# Patient Record
Sex: Female | Born: 1980 | Race: White | Hispanic: No | Marital: Married | Smoking: Former smoker
Health system: Southern US, Community
[De-identification: ages and names within clinical notes are randomized; demographics above are authoritative.]

## PROBLEM LIST (undated history)

## (undated) DIAGNOSIS — I10 Essential (primary) hypertension: Secondary | ICD-10-CM

## (undated) DIAGNOSIS — G43909 Migraine, unspecified, not intractable, without status migrainosus: Secondary | ICD-10-CM

## (undated) DIAGNOSIS — M1990 Unspecified osteoarthritis, unspecified site: Secondary | ICD-10-CM

## (undated) DIAGNOSIS — Z9109 Other allergy status, other than to drugs and biological substances: Secondary | ICD-10-CM

## (undated) DIAGNOSIS — F419 Anxiety disorder, unspecified: Secondary | ICD-10-CM

## (undated) DIAGNOSIS — K259 Gastric ulcer, unspecified as acute or chronic, without hemorrhage or perforation: Secondary | ICD-10-CM

## (undated) DIAGNOSIS — K219 Gastro-esophageal reflux disease without esophagitis: Secondary | ICD-10-CM

## (undated) DIAGNOSIS — K2970 Gastritis, unspecified, without bleeding: Secondary | ICD-10-CM

## (undated) HISTORY — PX: CHOLECYSTECTOMY: SHX55

## (undated) HISTORY — PX: LEG SURGERY: SHX1003

## (undated) HISTORY — PX: BILATERAL SALPINGECTOMY: SHX5743

## (undated) HISTORY — PX: BRAIN SURGERY: SHX531

## (undated) HISTORY — PX: BONE BIOPSY: SHX375

## (undated) HISTORY — PX: ECTOPIC PREGNANCY SURGERY: SHX613

## (undated) HISTORY — DX: Anxiety disorder, unspecified: F41.9

## (undated) HISTORY — PX: OTHER SURGICAL HISTORY: SHX169

---

## 2001-03-02 ENCOUNTER — Encounter: Payer: Self-pay | Admitting: Internal Medicine

## 2001-03-02 ENCOUNTER — Ambulatory Visit (HOSPITAL_COMMUNITY): Admission: RE | Admit: 2001-03-02 | Discharge: 2001-03-02 | Payer: Self-pay | Admitting: Internal Medicine

## 2001-05-14 ENCOUNTER — Emergency Department (HOSPITAL_COMMUNITY): Admission: EM | Admit: 2001-05-14 | Discharge: 2001-05-14 | Payer: Self-pay | Admitting: Emergency Medicine

## 2001-07-01 ENCOUNTER — Emergency Department (HOSPITAL_COMMUNITY): Admission: EM | Admit: 2001-07-01 | Discharge: 2001-07-01 | Payer: Self-pay | Admitting: *Deleted

## 2001-07-01 ENCOUNTER — Encounter: Payer: Self-pay | Admitting: *Deleted

## 2001-08-01 ENCOUNTER — Encounter: Payer: Self-pay | Admitting: Internal Medicine

## 2001-08-01 ENCOUNTER — Ambulatory Visit (HOSPITAL_COMMUNITY): Admission: RE | Admit: 2001-08-01 | Discharge: 2001-08-01 | Payer: Self-pay | Admitting: Internal Medicine

## 2001-12-29 ENCOUNTER — Inpatient Hospital Stay (HOSPITAL_COMMUNITY): Admission: AD | Admit: 2001-12-29 | Discharge: 2001-12-31 | Payer: Self-pay | Admitting: Oral Surgery

## 2001-12-29 ENCOUNTER — Encounter: Payer: Self-pay | Admitting: Oral Surgery

## 2002-05-11 ENCOUNTER — Ambulatory Visit (HOSPITAL_COMMUNITY): Admission: RE | Admit: 2002-05-11 | Discharge: 2002-05-11 | Payer: Self-pay | Admitting: Internal Medicine

## 2002-05-11 ENCOUNTER — Encounter: Payer: Self-pay | Admitting: Internal Medicine

## 2002-06-13 ENCOUNTER — Ambulatory Visit (HOSPITAL_COMMUNITY): Admission: RE | Admit: 2002-06-13 | Discharge: 2002-06-13 | Payer: Self-pay | Admitting: Internal Medicine

## 2002-07-08 ENCOUNTER — Emergency Department (HOSPITAL_COMMUNITY): Admission: EM | Admit: 2002-07-08 | Discharge: 2002-07-08 | Payer: Self-pay | Admitting: *Deleted

## 2002-07-09 ENCOUNTER — Observation Stay (HOSPITAL_COMMUNITY): Admission: EM | Admit: 2002-07-09 | Discharge: 2002-07-10 | Payer: Self-pay | Admitting: Emergency Medicine

## 2002-07-10 ENCOUNTER — Encounter: Payer: Self-pay | Admitting: *Deleted

## 2002-11-12 ENCOUNTER — Emergency Department (HOSPITAL_COMMUNITY): Admission: EM | Admit: 2002-11-12 | Discharge: 2002-11-12 | Payer: Self-pay | Admitting: Internal Medicine

## 2002-11-12 ENCOUNTER — Encounter: Payer: Self-pay | Admitting: Internal Medicine

## 2003-01-11 ENCOUNTER — Encounter: Payer: Self-pay | Admitting: Internal Medicine

## 2003-01-11 ENCOUNTER — Ambulatory Visit (HOSPITAL_COMMUNITY): Admission: RE | Admit: 2003-01-11 | Discharge: 2003-01-11 | Payer: Self-pay | Admitting: Internal Medicine

## 2003-04-12 ENCOUNTER — Ambulatory Visit (HOSPITAL_COMMUNITY): Admission: RE | Admit: 2003-04-12 | Discharge: 2003-04-12 | Payer: Self-pay | Admitting: Internal Medicine

## 2003-04-12 ENCOUNTER — Encounter: Payer: Self-pay | Admitting: Internal Medicine

## 2003-04-25 ENCOUNTER — Emergency Department (HOSPITAL_COMMUNITY): Admission: EM | Admit: 2003-04-25 | Discharge: 2003-04-25 | Payer: Self-pay | Admitting: Emergency Medicine

## 2003-04-26 ENCOUNTER — Encounter: Payer: Self-pay | Admitting: Internal Medicine

## 2003-04-26 ENCOUNTER — Ambulatory Visit (HOSPITAL_COMMUNITY): Admission: RE | Admit: 2003-04-26 | Discharge: 2003-04-26 | Payer: Self-pay | Admitting: Internal Medicine

## 2003-05-14 ENCOUNTER — Encounter: Payer: Self-pay | Admitting: Family Medicine

## 2003-05-14 ENCOUNTER — Ambulatory Visit (HOSPITAL_COMMUNITY): Admission: RE | Admit: 2003-05-14 | Discharge: 2003-05-14 | Payer: Self-pay | Admitting: Family Medicine

## 2003-08-20 ENCOUNTER — Inpatient Hospital Stay (HOSPITAL_COMMUNITY): Admission: EM | Admit: 2003-08-20 | Discharge: 2003-08-22 | Payer: Self-pay | Admitting: Internal Medicine

## 2003-09-07 ENCOUNTER — Ambulatory Visit (HOSPITAL_COMMUNITY): Admission: AD | Admit: 2003-09-07 | Discharge: 2003-09-07 | Payer: Self-pay | Admitting: Obstetrics & Gynecology

## 2003-11-26 ENCOUNTER — Ambulatory Visit (HOSPITAL_COMMUNITY): Admission: AD | Admit: 2003-11-26 | Discharge: 2003-11-26 | Payer: Self-pay | Admitting: Obstetrics & Gynecology

## 2003-12-09 ENCOUNTER — Ambulatory Visit (HOSPITAL_COMMUNITY): Admission: AD | Admit: 2003-12-09 | Discharge: 2003-12-09 | Payer: Self-pay | Admitting: Obstetrics and Gynecology

## 2003-12-10 ENCOUNTER — Ambulatory Visit (HOSPITAL_COMMUNITY): Admission: AD | Admit: 2003-12-10 | Discharge: 2003-12-10 | Payer: Self-pay | Admitting: Obstetrics and Gynecology

## 2004-01-20 ENCOUNTER — Inpatient Hospital Stay (HOSPITAL_COMMUNITY): Admission: AD | Admit: 2004-01-20 | Discharge: 2004-01-22 | Payer: Self-pay | Admitting: Obstetrics and Gynecology

## 2004-07-07 ENCOUNTER — Encounter (HOSPITAL_COMMUNITY): Admission: RE | Admit: 2004-07-07 | Discharge: 2004-07-08 | Payer: Self-pay | Admitting: Internal Medicine

## 2004-10-15 ENCOUNTER — Ambulatory Visit (HOSPITAL_COMMUNITY): Admission: RE | Admit: 2004-10-15 | Discharge: 2004-10-15 | Payer: Self-pay | Admitting: Obstetrics and Gynecology

## 2004-11-20 ENCOUNTER — Ambulatory Visit (HOSPITAL_COMMUNITY): Admission: RE | Admit: 2004-11-20 | Discharge: 2004-11-20 | Payer: Self-pay | Admitting: Internal Medicine

## 2005-02-19 ENCOUNTER — Emergency Department (HOSPITAL_COMMUNITY): Admission: EM | Admit: 2005-02-19 | Discharge: 2005-02-19 | Payer: Self-pay | Admitting: Emergency Medicine

## 2006-08-16 ENCOUNTER — Emergency Department (HOSPITAL_COMMUNITY): Admission: EM | Admit: 2006-08-16 | Discharge: 2006-08-16 | Payer: Self-pay | Admitting: Emergency Medicine

## 2006-08-16 IMAGING — CR DG ABDOMEN ACUTE W/ 1V CHEST
3 series · 3 of 3 positions shown · non-contrast
Comparison: None

CLINICAL DATA: Vomiting

ABDOMEN SERIES - 2 VIEW & CHEST - 1 VIEW

[view not recorded (1 of 3)]
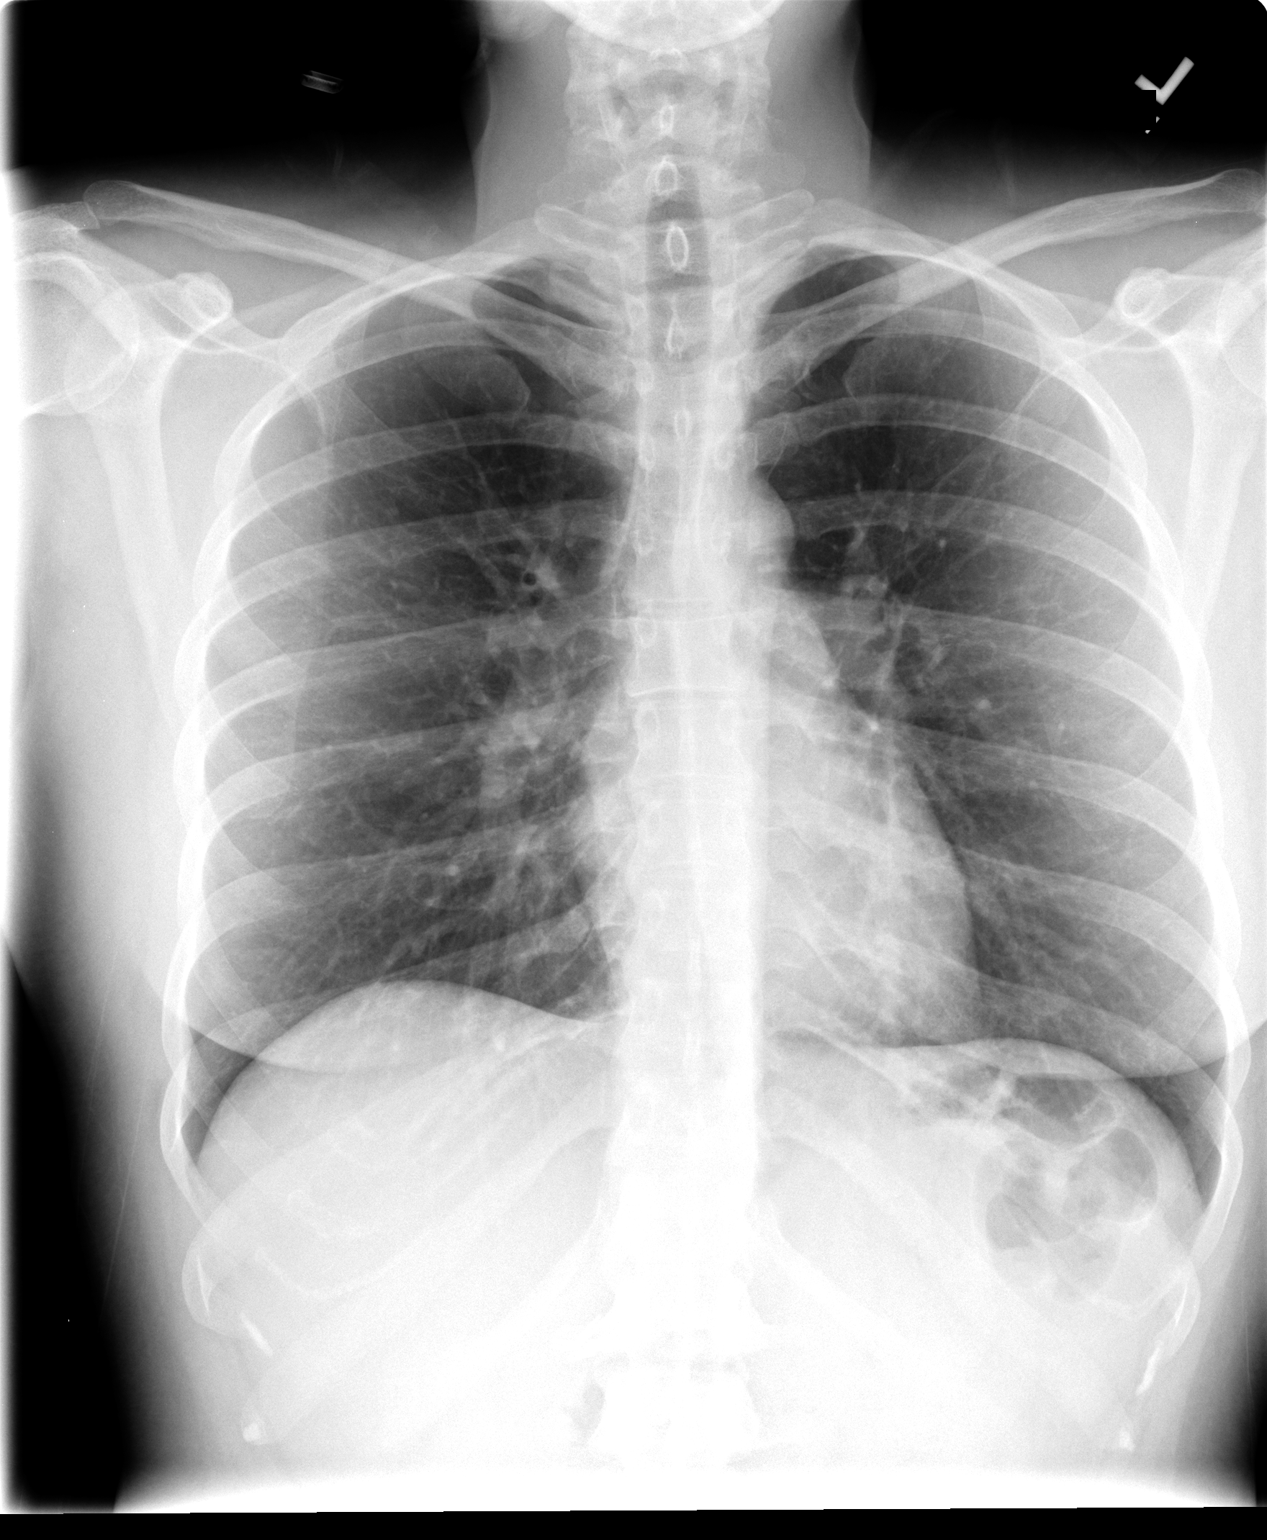

[view not recorded (2 of 3)]
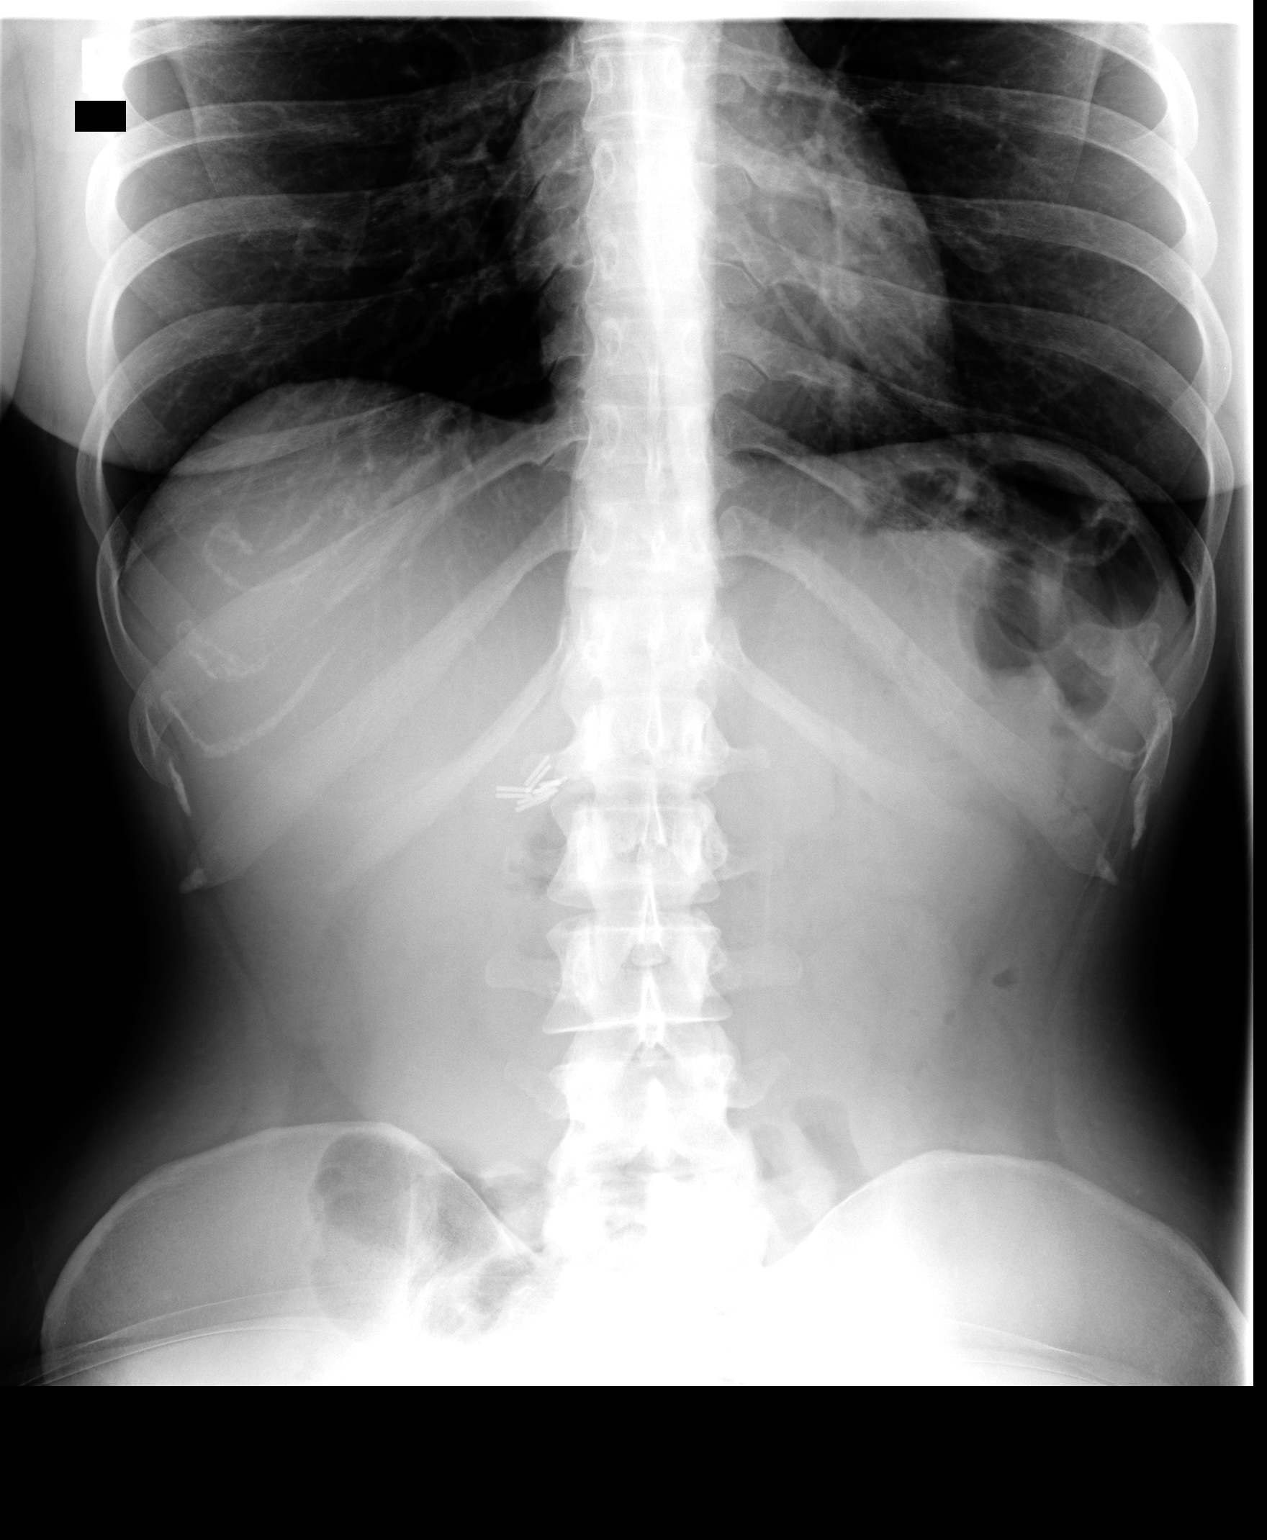

[view not recorded (3 of 3)]
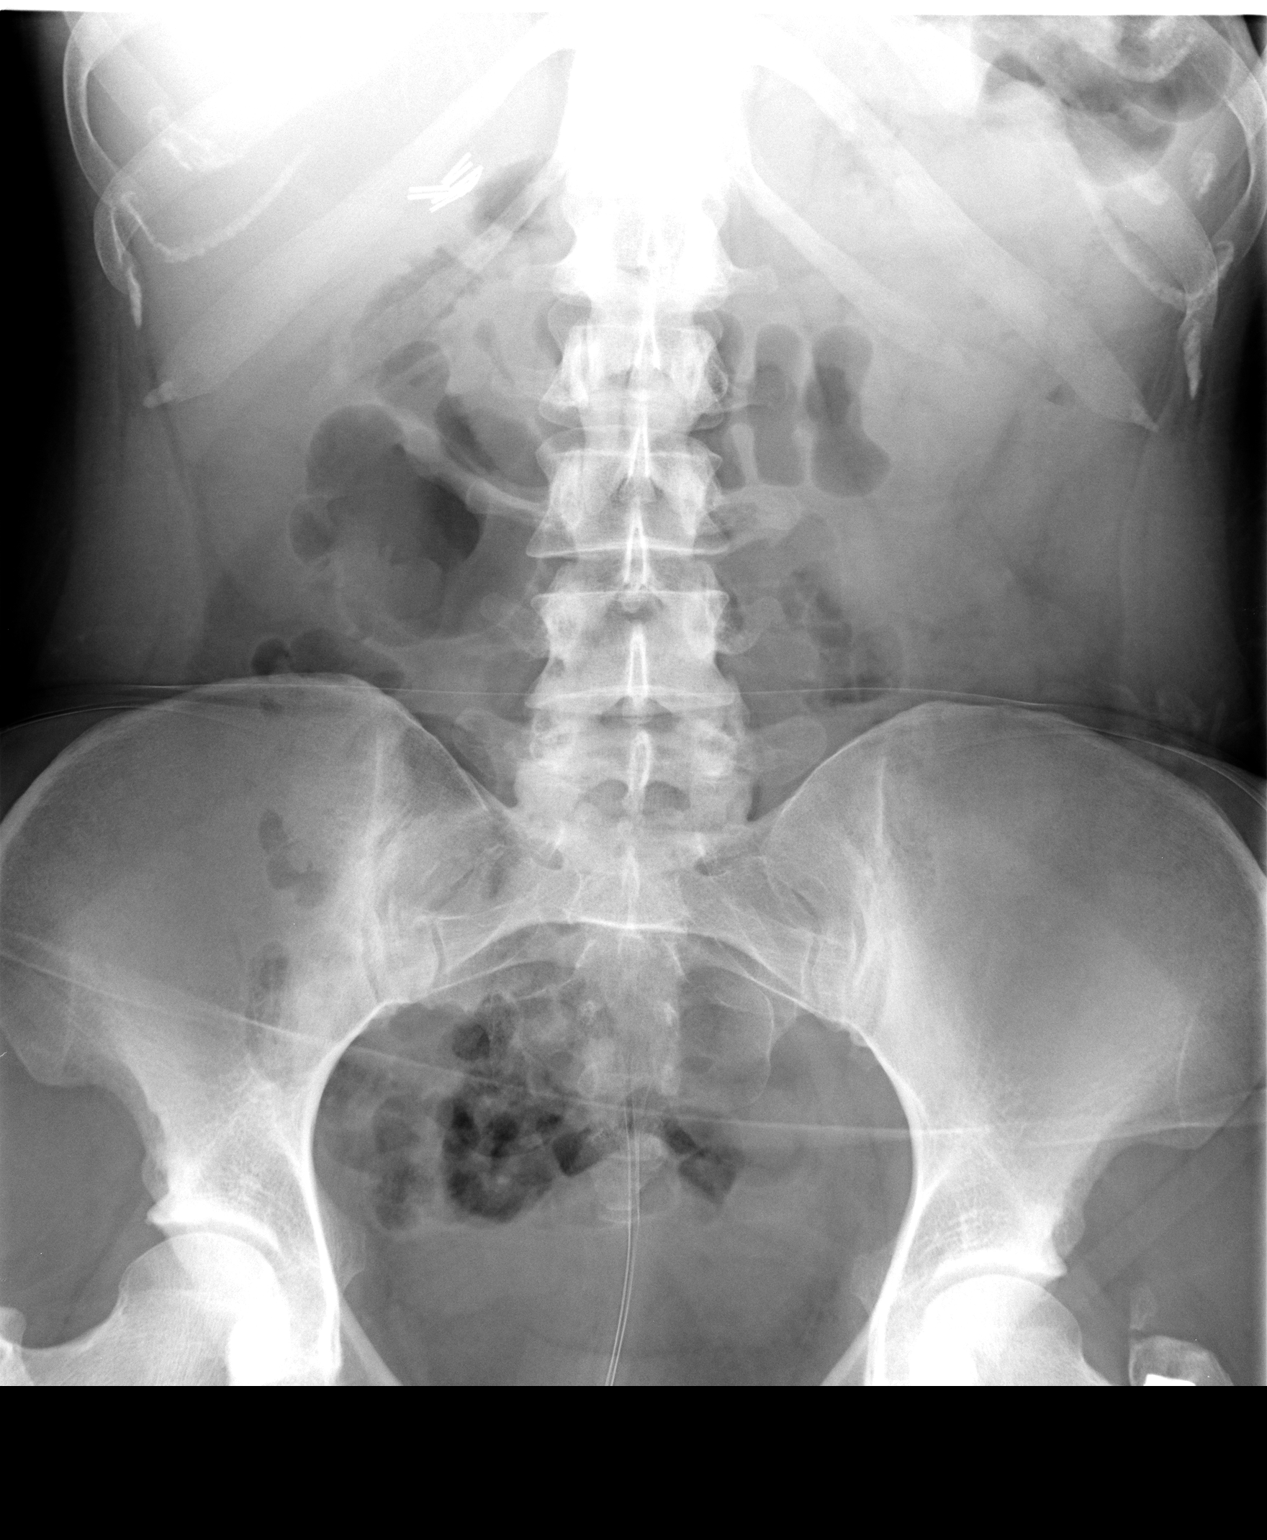

[3 of 3 positions shown; findings below may reference images not displayed]

FINDINGS: Cardiomediastinal contours are within normal limits. Lungs are clear.

There is a nonobstructive bowel gas pattern. No free air, organomegaly, or
suspicious calcification. Patient status post cholecystectomy.

IMPRESSION

No obstruction or free air.

No active cardiopulmonary disease.

## 2006-08-17 ENCOUNTER — Inpatient Hospital Stay (HOSPITAL_COMMUNITY): Admission: EM | Admit: 2006-08-17 | Discharge: 2006-08-18 | Payer: Self-pay | Admitting: Emergency Medicine

## 2006-08-17 IMAGING — US US ABDOMEN COMPLETE
1 series · 14 of 25 positions shown · non-contrast
Comparison: [DATE] plain films.

CLINICAL DATA: Nausea and vomiting.
 ABDOMEN ULTRASOUND:
TECHNIQUE: Complete abdominal ultrasound examination was performed including evaluation of the liver, gallbladder, bile ducts, pancreas, kidneys, spleen, IVC, and abdominal aorta.

[Series 1: unknown · 0.33mm/px · 14 of 47 slices shown]
[im 1/47]
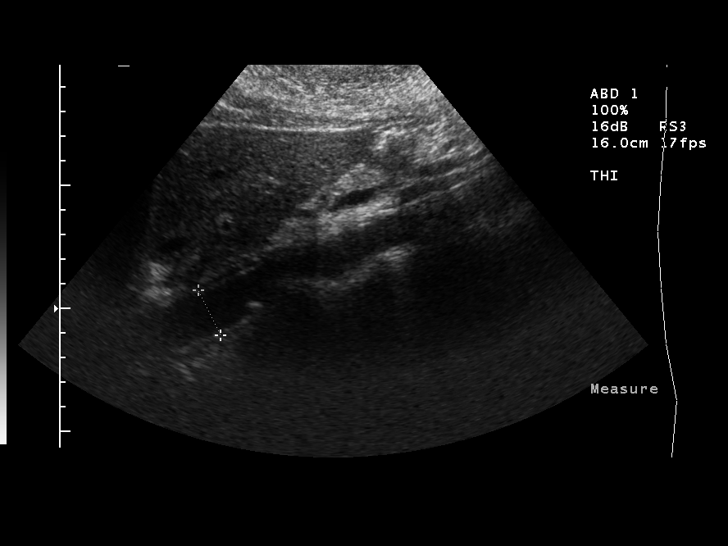
[im 4/47]
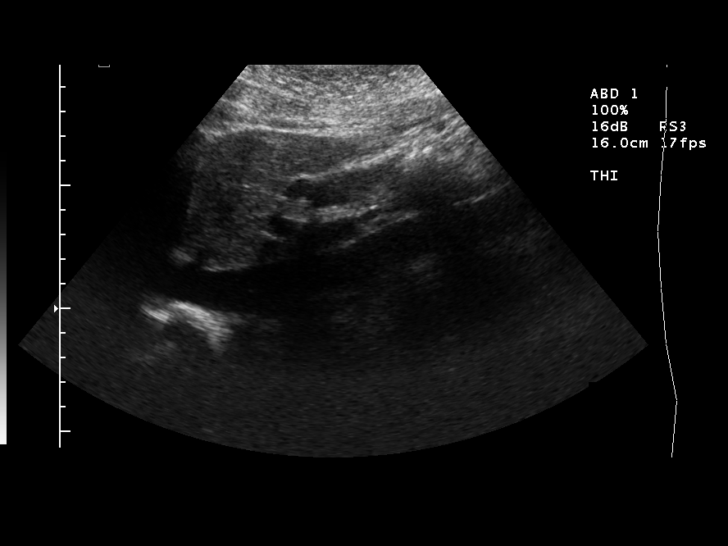
[im 8/47]
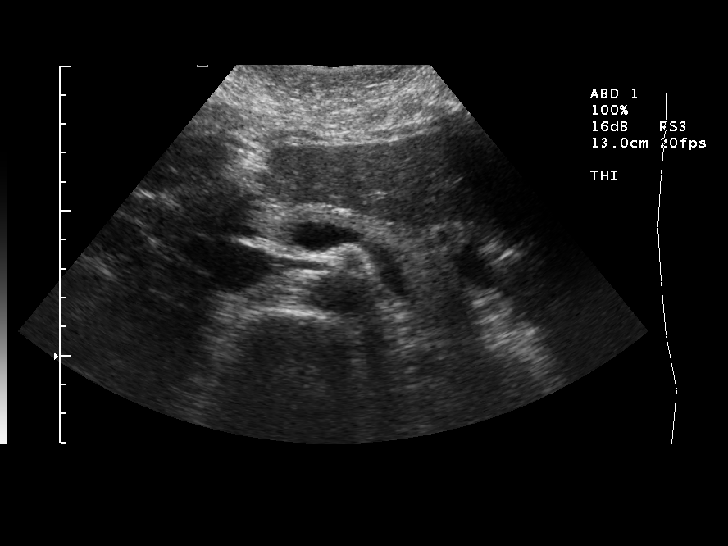
[im 12/47]
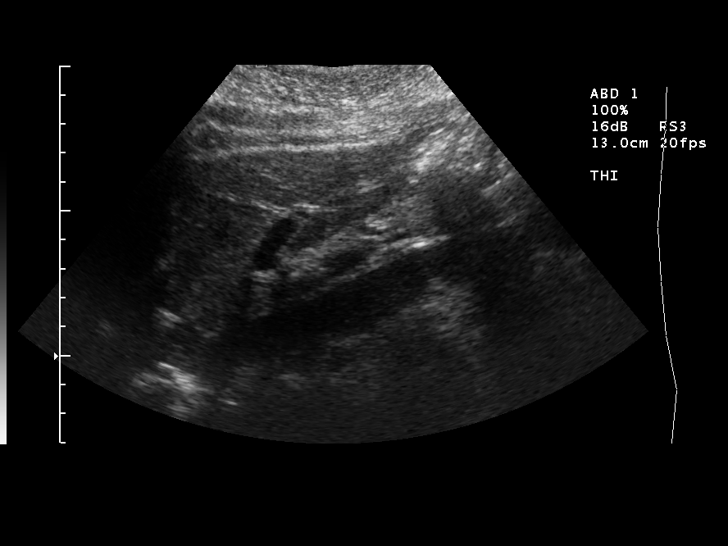
[im 16/47]
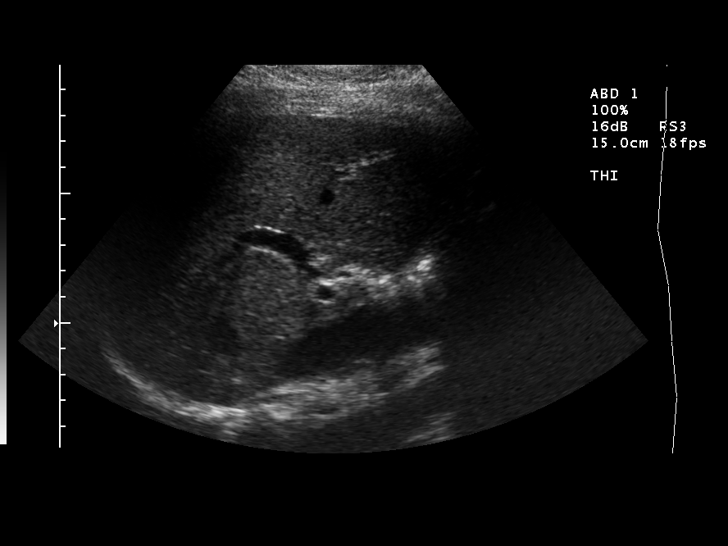
[im 18/47]
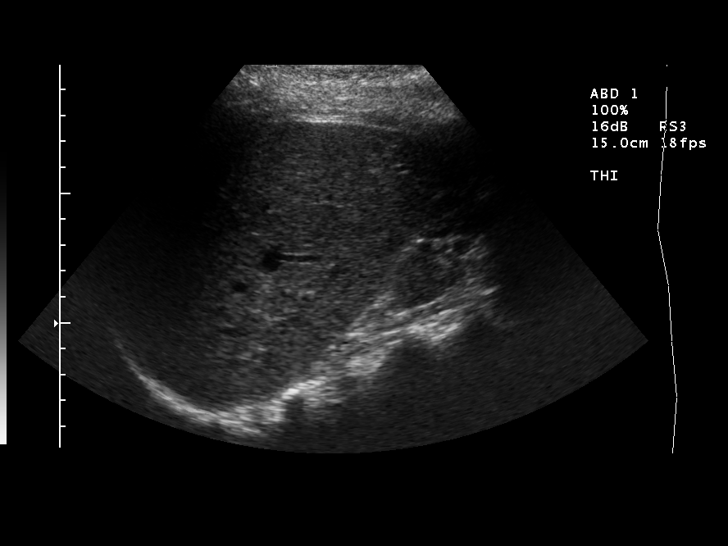
[im 22/47]
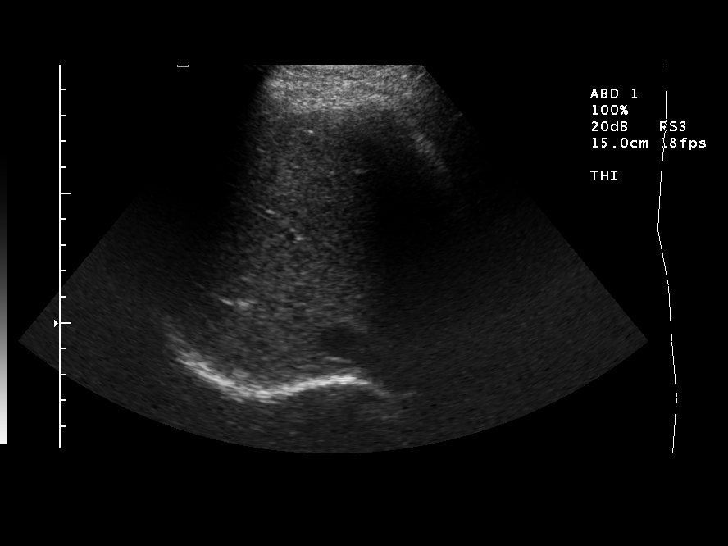
[im 25/47]
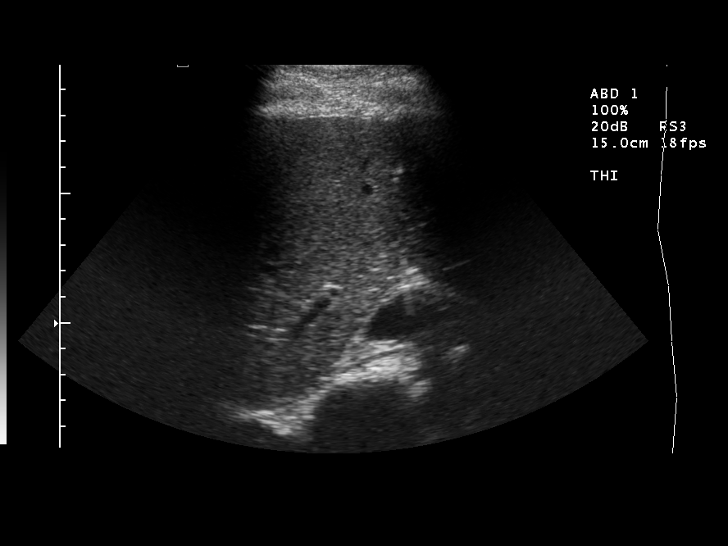
[im 29/47]
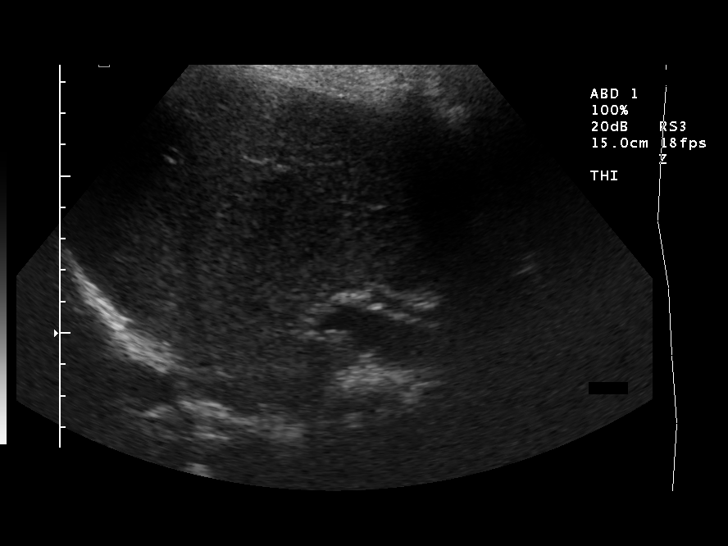
[im 31/47]
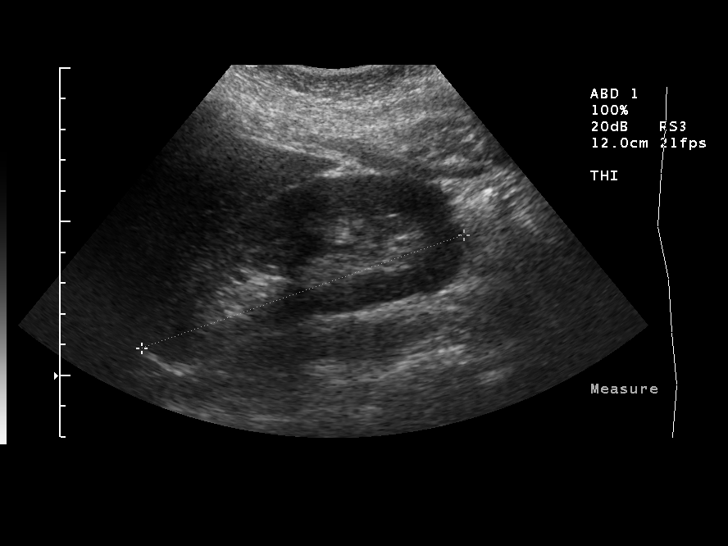
[im 35/47]
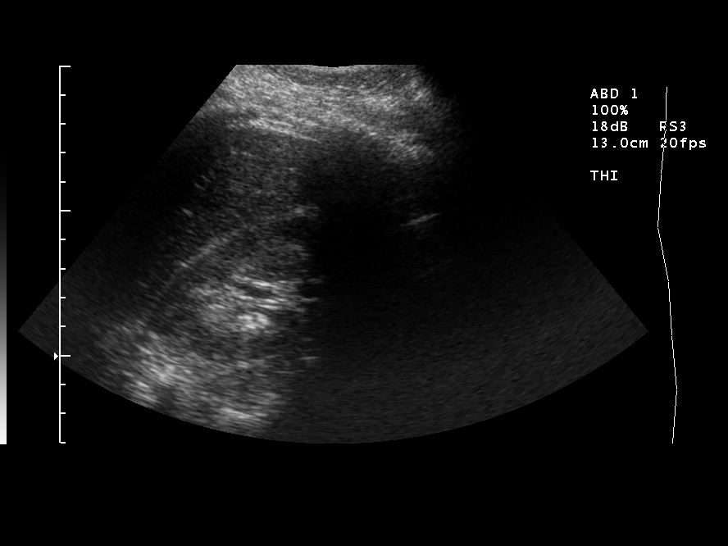
[im 39/47]
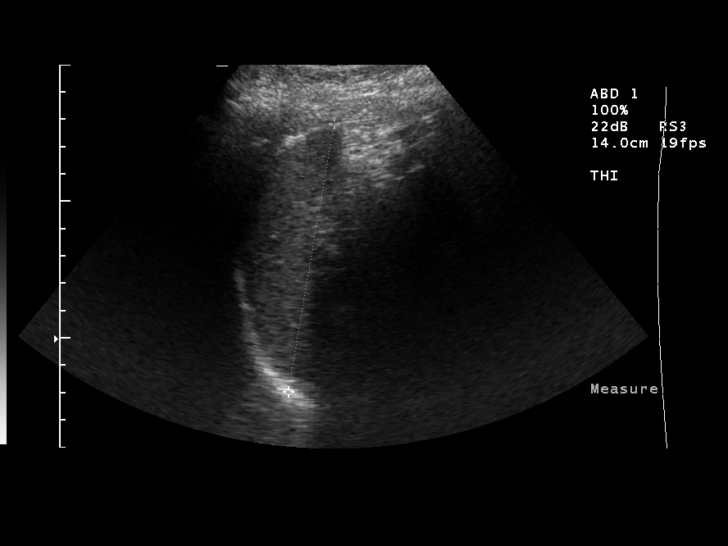
[im 43/47]
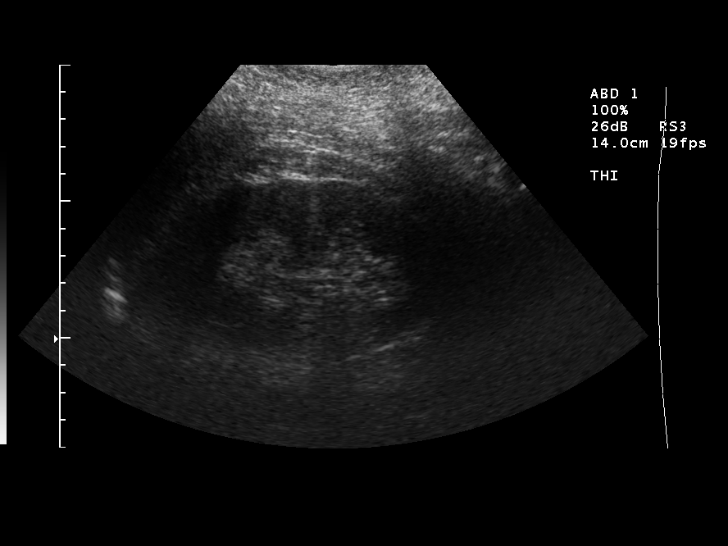
[im 47/47]
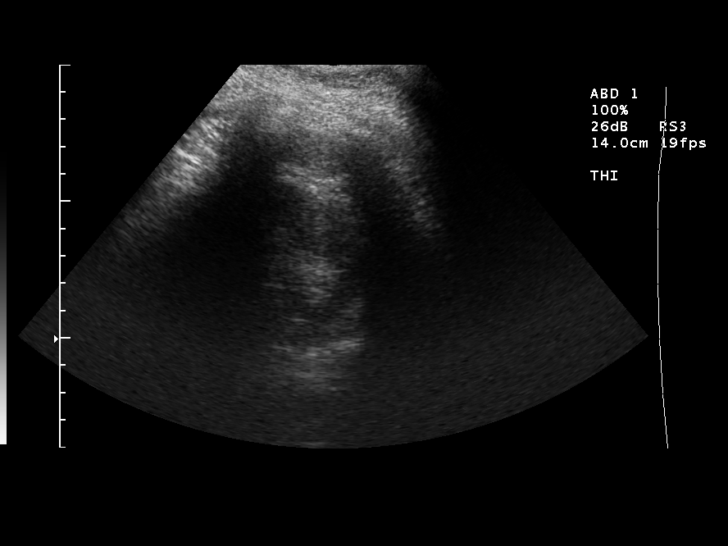

[14 of 25 positions shown; findings below may reference images not displayed]

FINDINGS: The gallbladder is surgically absent.  The common duct is normal at 5 mm.  The liver is normal in echotexture.  The IVC is normal in caliber. 
 The visualized portions of the pancreas are within normal limits.  Limited evaluation of the pancreatic tail. 
 The spleen is normal in size and echotexture.  The right kidney is 11.0 and the left kidney is 10.4 cm.  No hydronephrosis. 
 The abdominal aorta is not aneurysmal.
IMPRESSION: 1.  Status-post cholecystectomy without biliary ductal dilatation. 
 2.  Otherwise unremarkable as described.

## 2008-01-24 ENCOUNTER — Emergency Department (HOSPITAL_COMMUNITY): Admission: EM | Admit: 2008-01-24 | Discharge: 2008-01-25 | Payer: Self-pay | Admitting: Emergency Medicine

## 2008-05-13 ENCOUNTER — Emergency Department (HOSPITAL_COMMUNITY): Admission: EM | Admit: 2008-05-13 | Discharge: 2008-05-13 | Payer: Self-pay | Admitting: Emergency Medicine

## 2008-05-13 IMAGING — CR DG FEMUR 2+V*R*
2 series · 2 of 2 positions shown · non-contrast
Comparison: None.

CLINICAL DATA: Fell.  Proximal right upper leg pain.

RIGHT FEMUR - 2 VIEW [DATE]:

[view not recorded (1 of 2)]
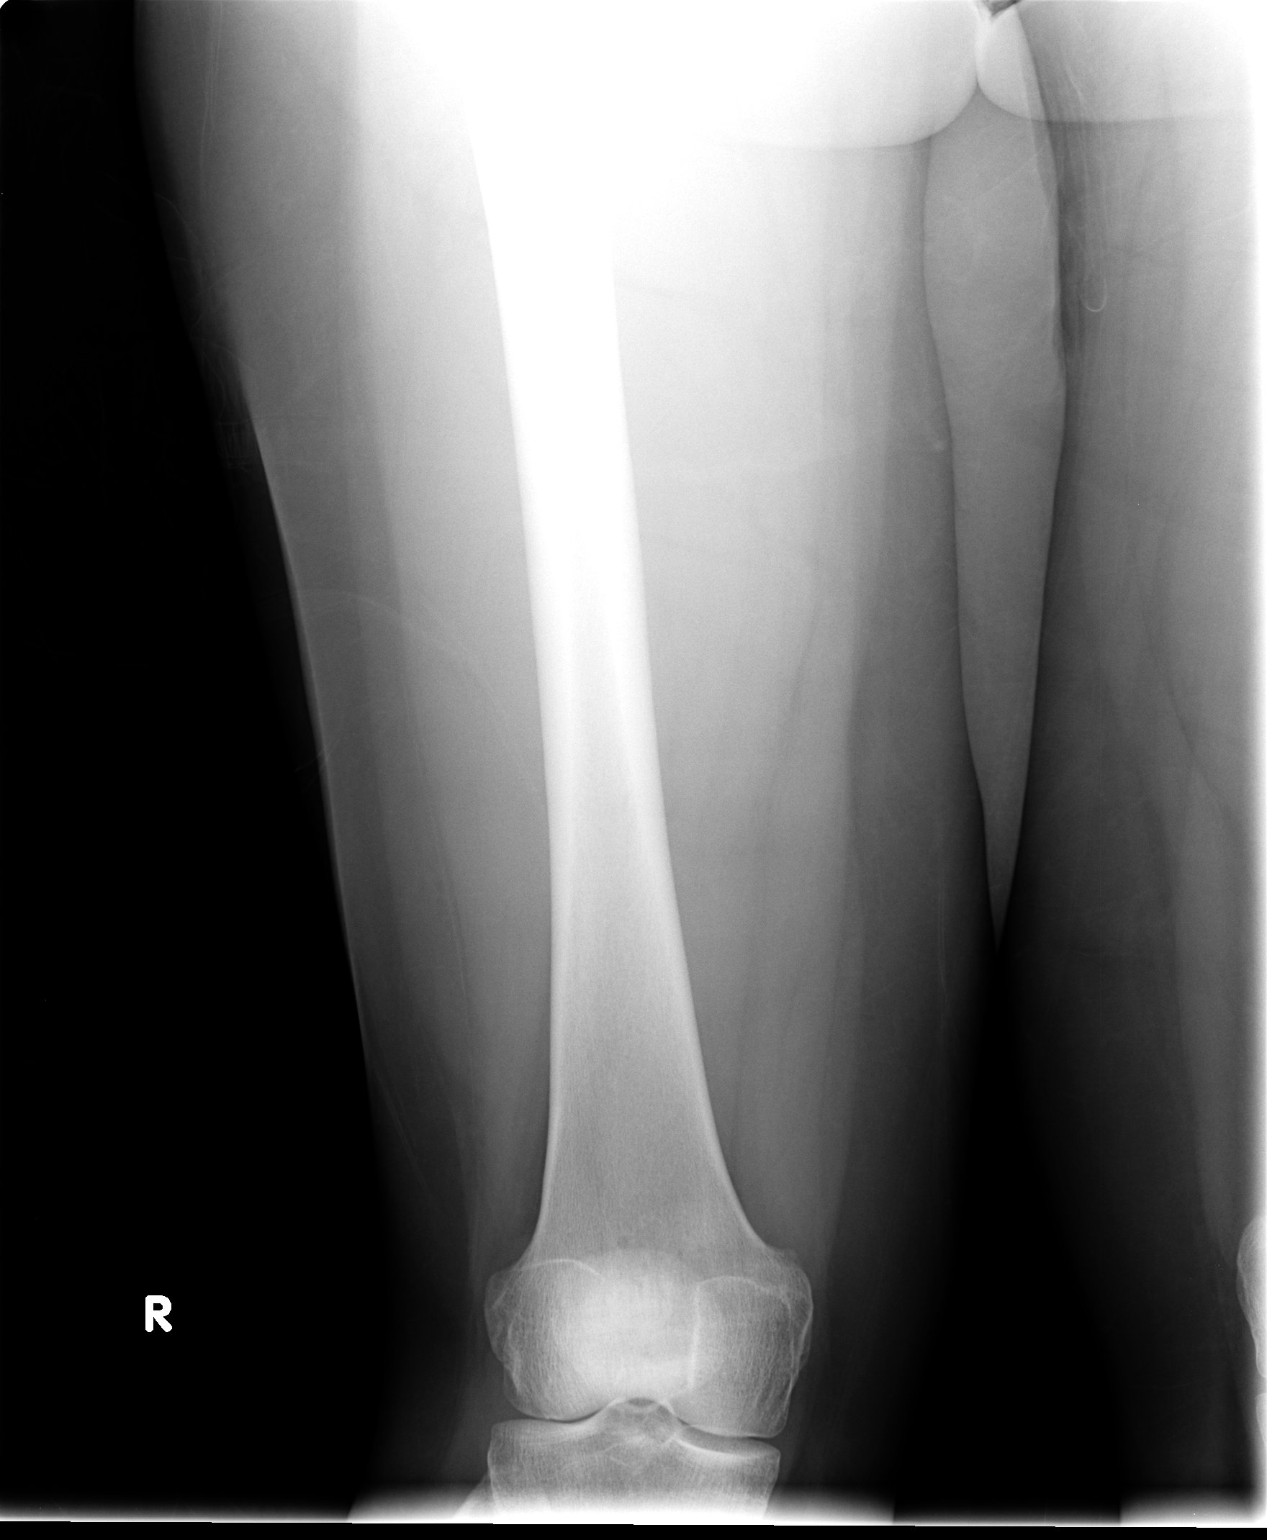

[view not recorded (2 of 2)]
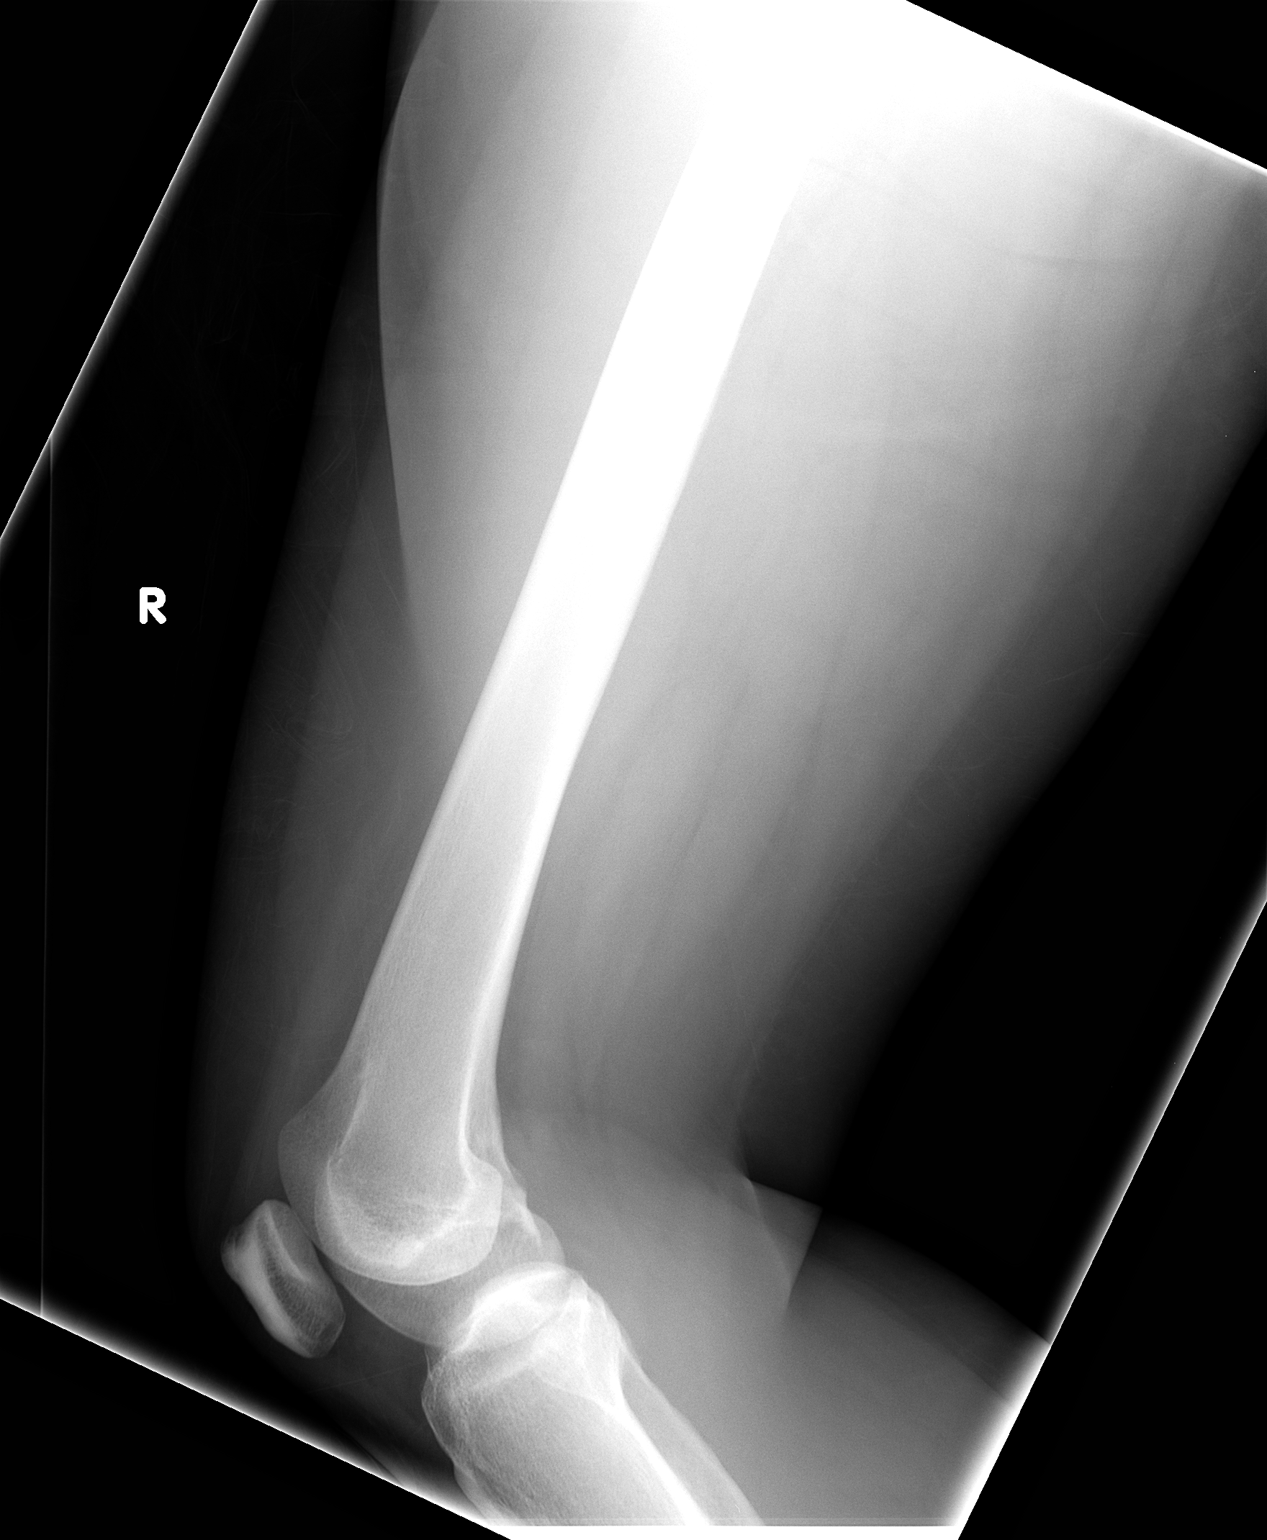

[2 of 2 positions shown; findings below may reference images not displayed]

FINDINGS: No acute fractures involving the femur.  Well-preserved
bone mineral density.  No intrinsic osseous abnormality.
Visualized knee joint intact.
IMPRESSION: Normal examination.

## 2008-05-13 IMAGING — CR DG HIP COMPLETE 2+V*R*
3 series · 3 of 3 positions shown · non-contrast
Comparison: None.

CLINICAL DATA: Fell and injured right hip.

RIGHT HIP - COMPLETE 2+ VIEW [DATE]:

[view not recorded (1 of 3)]
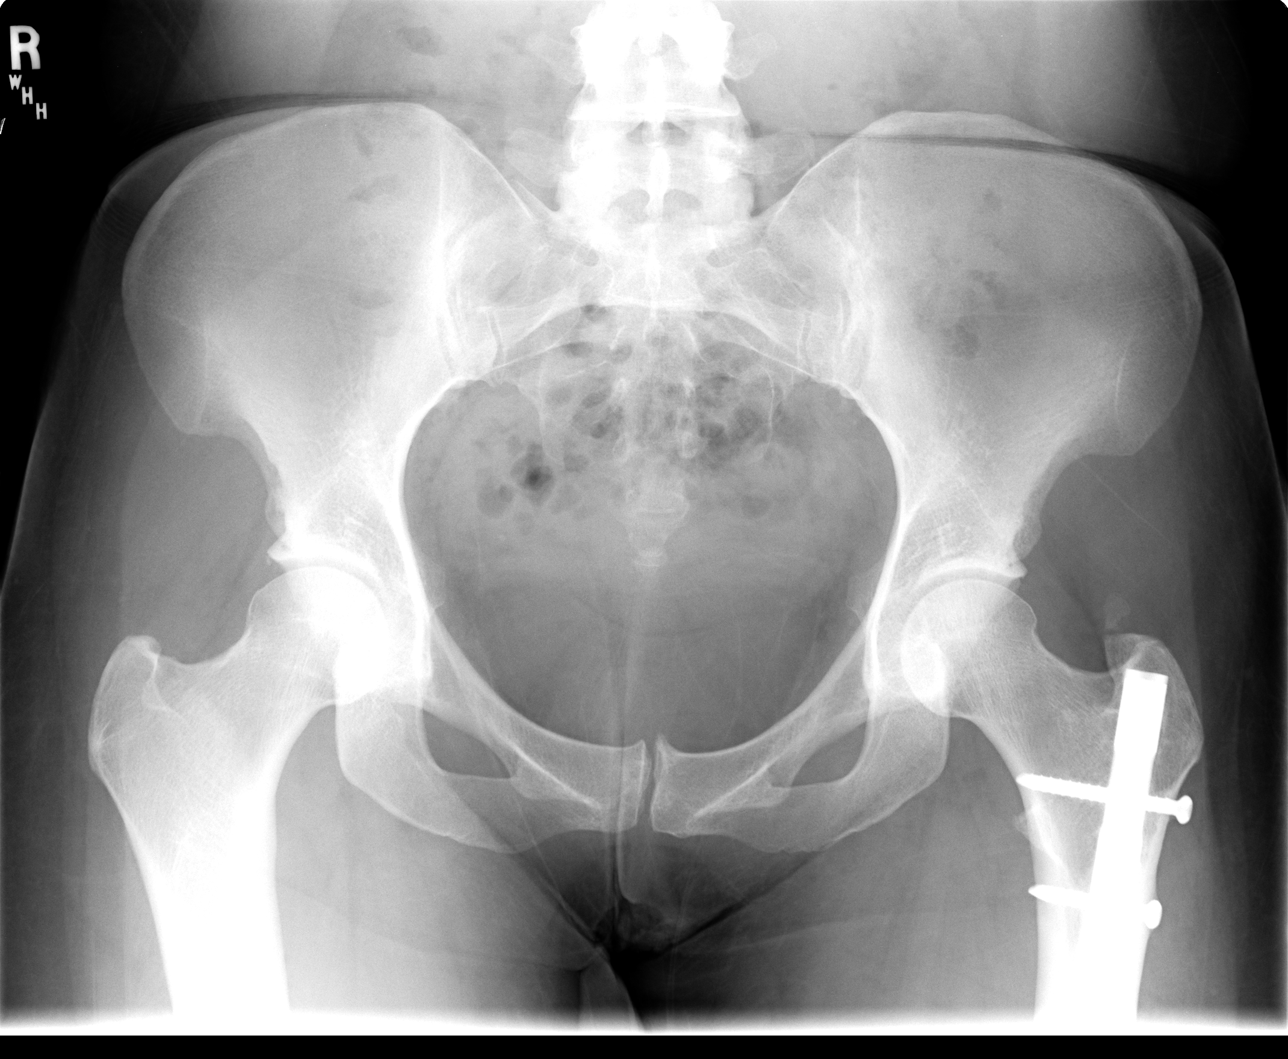

[view not recorded (2 of 3)]
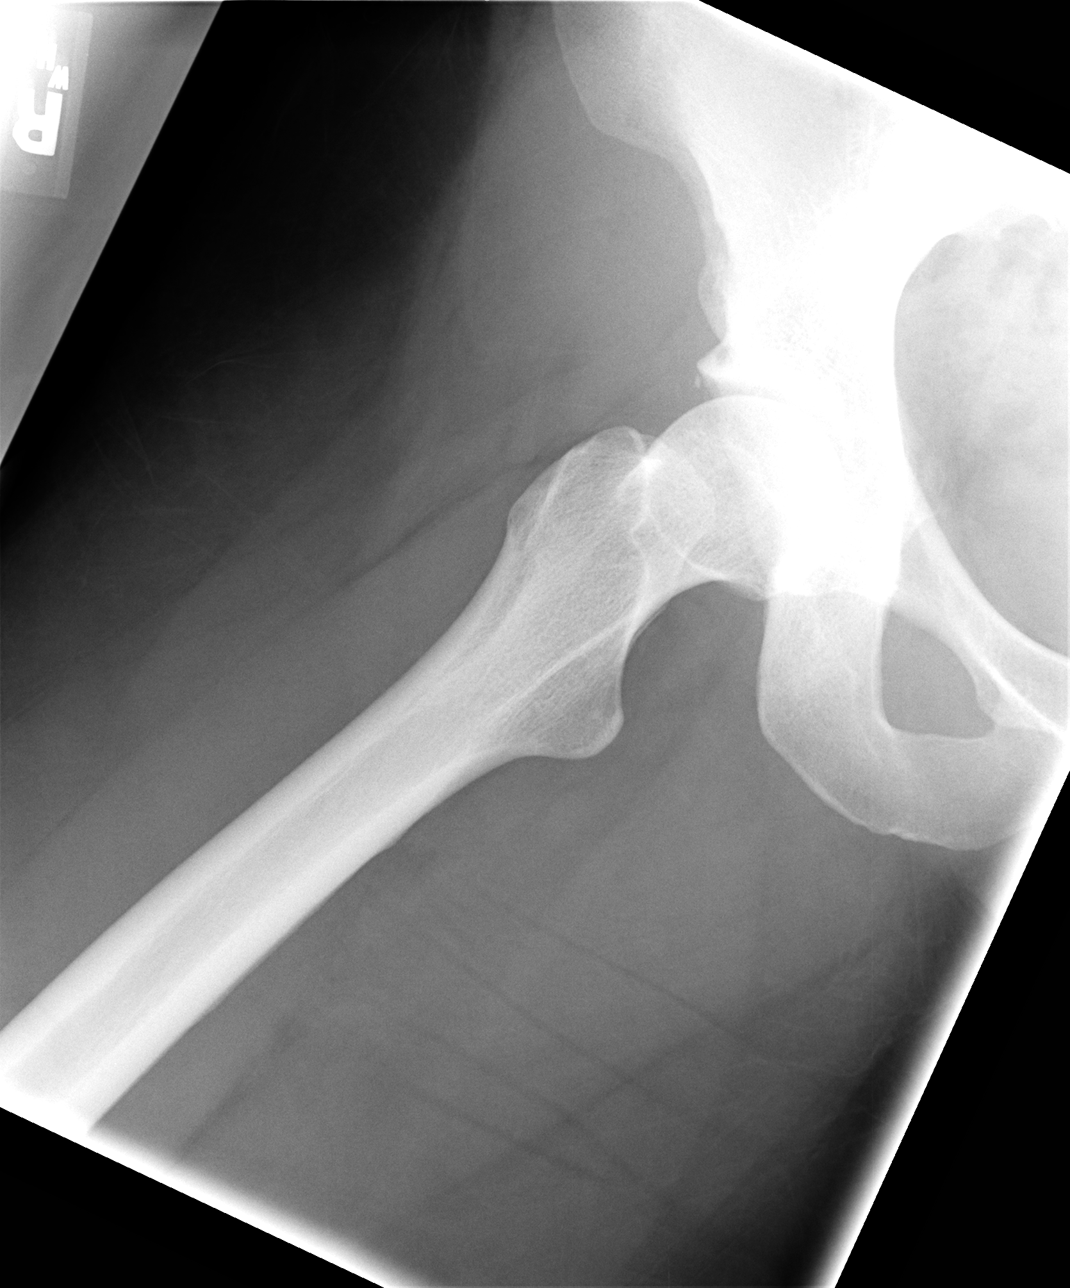

[view not recorded (3 of 3)]
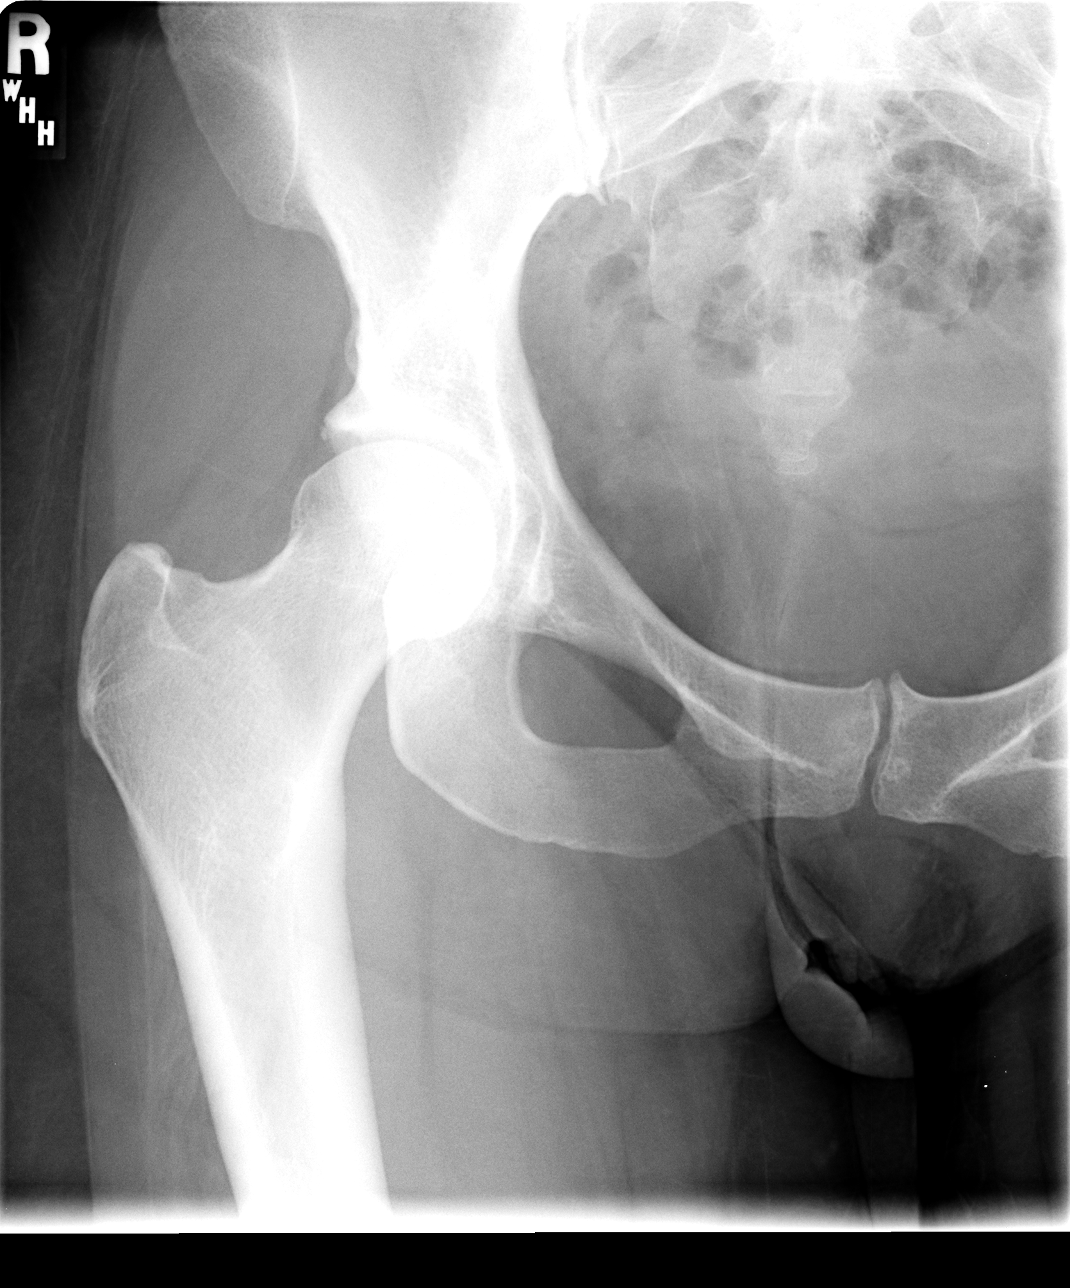

[3 of 3 positions shown; findings below may reference images not displayed]

FINDINGS: No evidence of acute fracture or dislocation.  Tiny
calcification adjacent to the acetabular roof.  Well-preserved
joint space.

Included AP pelvis demonstrating prior ORIF of left femur fracture
with intramedullary nail.  Calcification at the greater trochanter
of the left femur.  Degenerative changes in the symphysis pubis.
Left hip joint intact.  Sacroiliac joints intact.
IMPRESSION: No acute or significant abnormality involving the right hip.

## 2008-06-12 ENCOUNTER — Inpatient Hospital Stay (HOSPITAL_COMMUNITY): Admission: EM | Admit: 2008-06-12 | Discharge: 2008-06-13 | Payer: Self-pay | Admitting: Emergency Medicine

## 2008-06-12 ENCOUNTER — Ambulatory Visit: Payer: Self-pay | Admitting: Cardiovascular Disease

## 2008-06-12 IMAGING — CR DG ABDOMEN ACUTE W/ 1V CHEST
4 series · 4 of 4 positions shown · non-contrast
Comparison: None

CLINICAL DATA: Nausea, vomiting, and abdominal pain

ACUTE ABDOMEN SERIES (ABDOMEN 2 VIEW & CHEST 1 VIEW)

[view not recorded (1 of 4)]
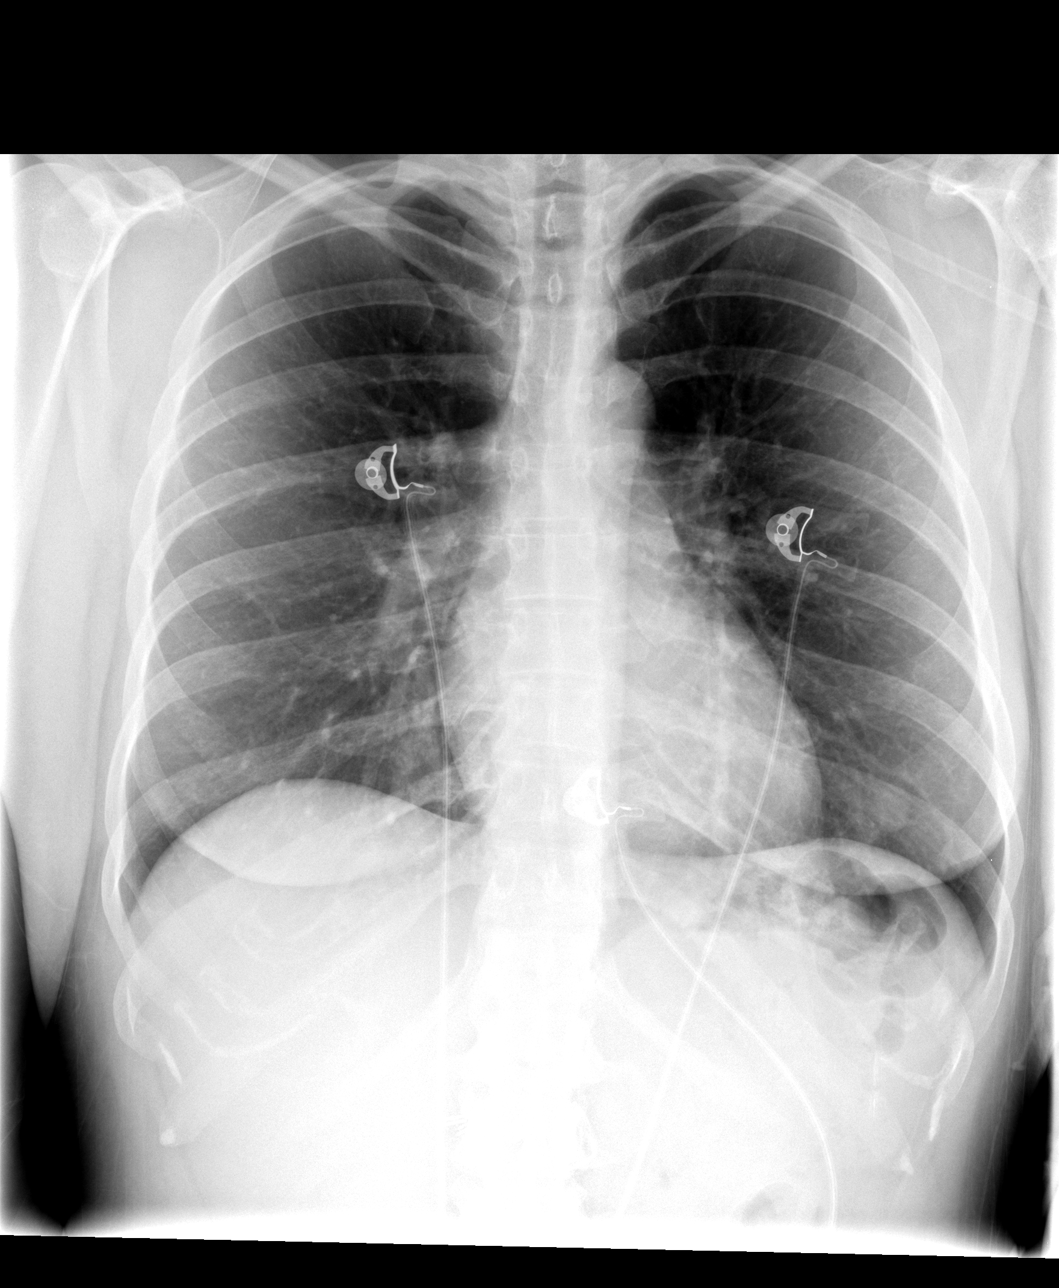

[view not recorded (2 of 4)]
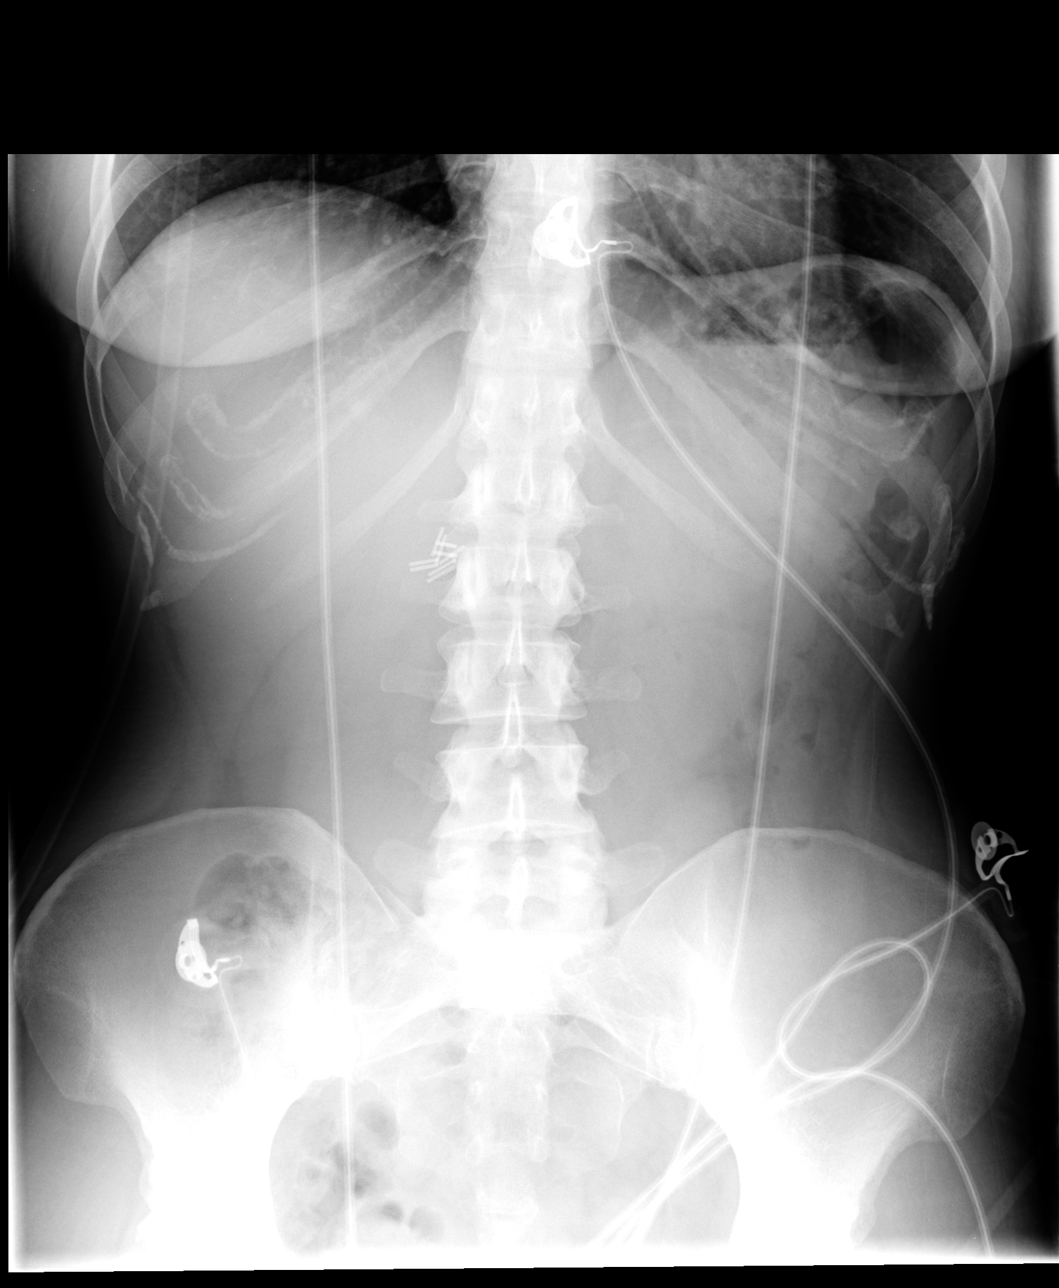

[view not recorded (3 of 4)]
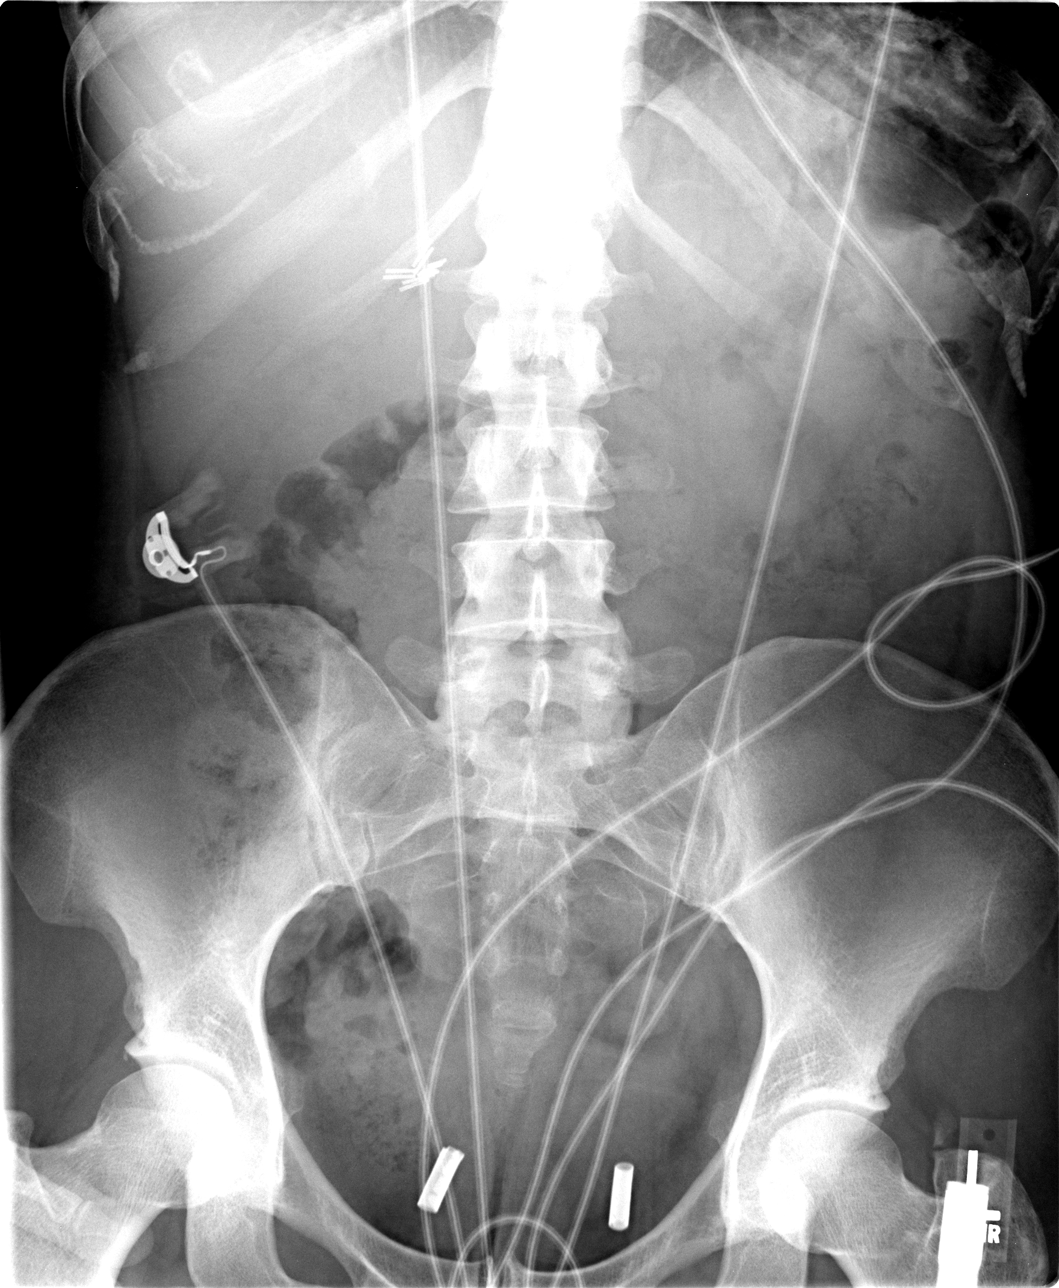

[view not recorded (4 of 4)]
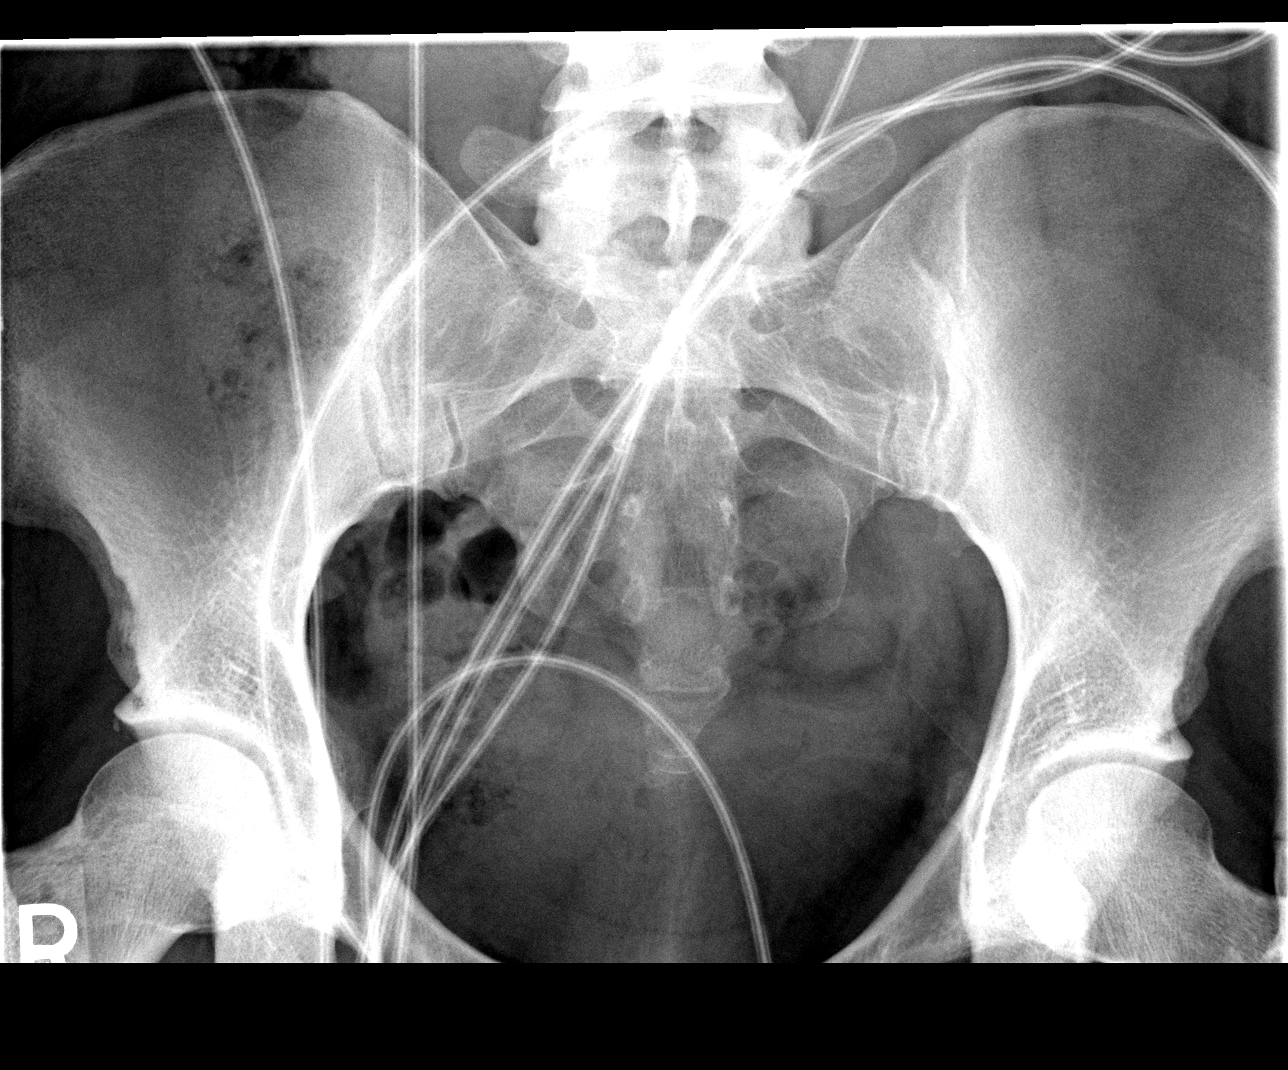

[4 of 4 positions shown; findings below may reference images not displayed]

FINDINGS: The chest is normal.  Normal bowel gas pattern.  No
findings to suggest an ileus or bowel obstruction.  No unusual
calcifications or mass effect.  Cholecystectomy clips.
IMPRESSION: No acute chest or abdominal findings.

## 2008-06-12 IMAGING — CT CT ABDOMEN W/ CM
1 of 3 series · 13 of 32 positions shown, 18 images · IV contrast (Omnipaque 300)
Comparison: Acute abdomen series [DATE].  Ultrasound
[DATE].

CT ABDOMEN

CLINICAL DATA: ACUTE GASTRITIS.  NAUSEA VOMITING.  ELEVATED WHITE
BLOOD CELL COUNT.

CT ABDOMEN AND PELVIS WITH CONTRAST
TECHNIQUE: Multidetector CT imaging of the abdomen and pelvis was
performed following the standard protocol following the bolus
administration of intravenous contrast.
Contrast: 100 ml [C3]

[Series 2: abd_pel 5.0 b40f · axial · 0.62mm/px · z∈[-428,-28]mm · 13 of 92 slices shown, 18 images]
[im 6/92  soft-tissue]
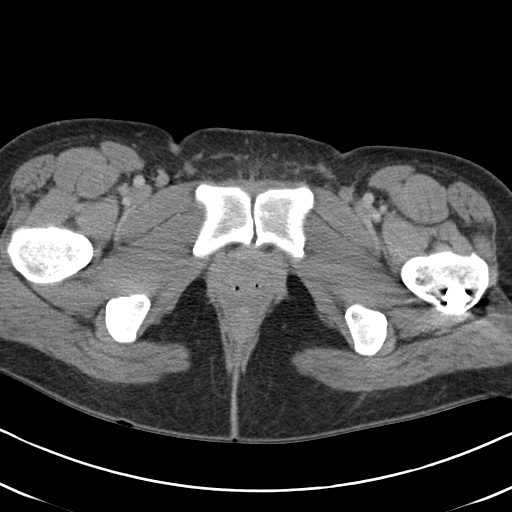
[im 6/92  bone]
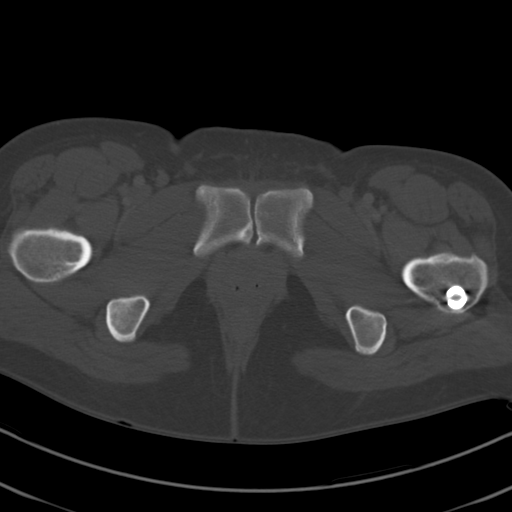
[im 12/92  soft-tissue]
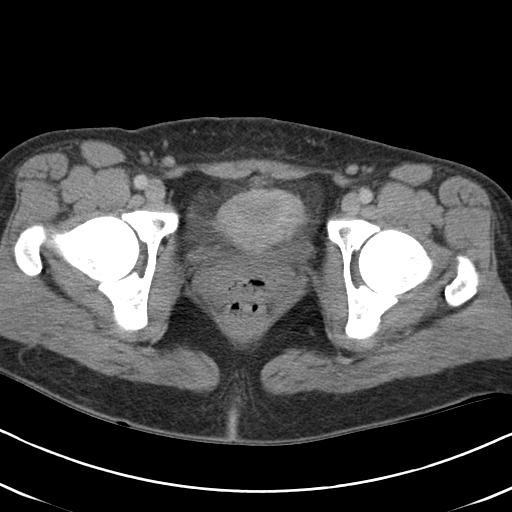
[im 23/92  soft-tissue]
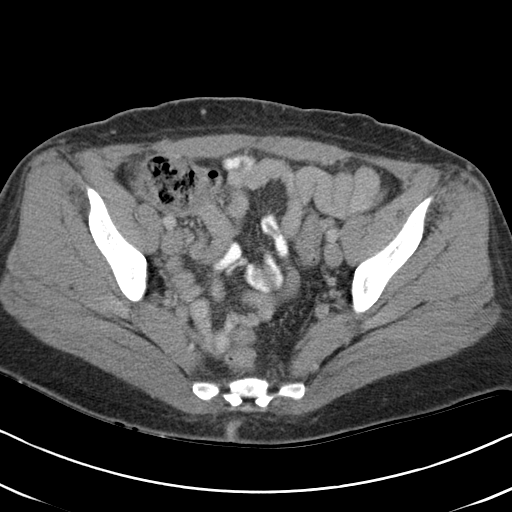
[im 29/92  soft-tissue]
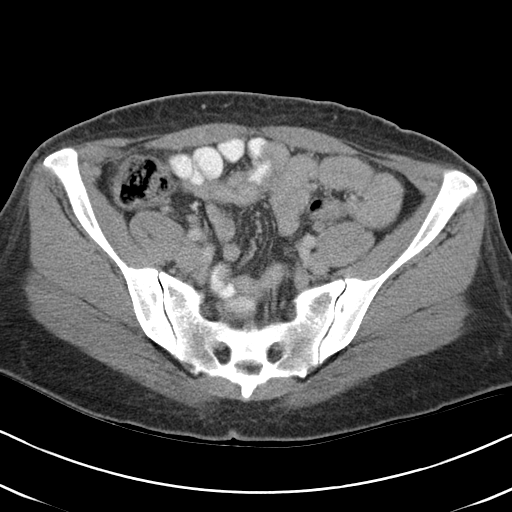
[im 35/92  soft-tissue]
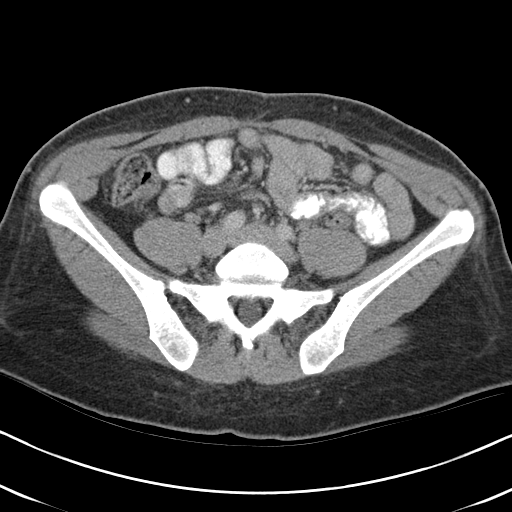
[im 40/92  soft-tissue]
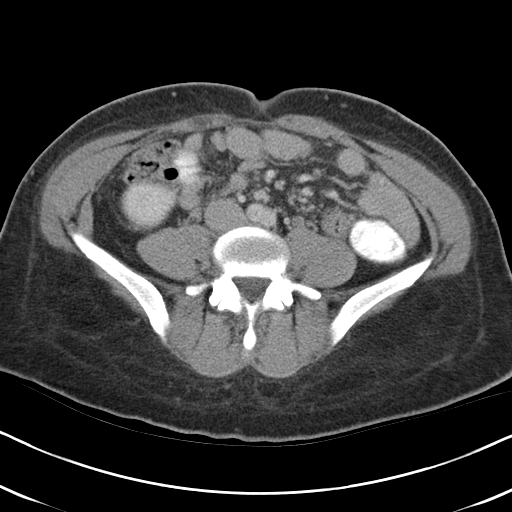
[im 52/92  soft-tissue]
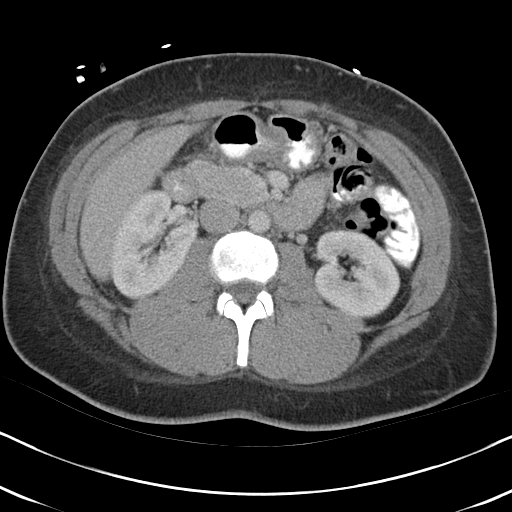
[im 57/92  soft-tissue]
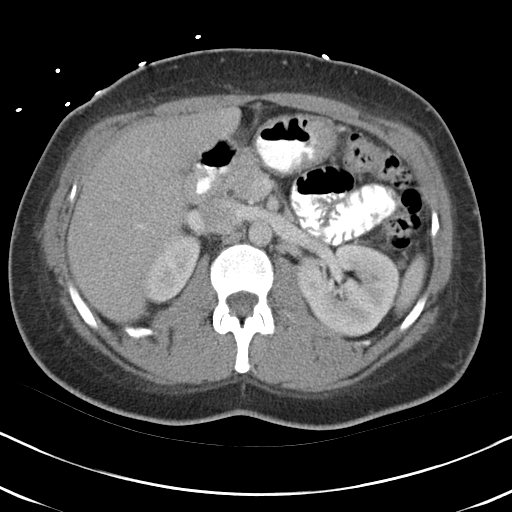
[im 63/92  soft-tissue]
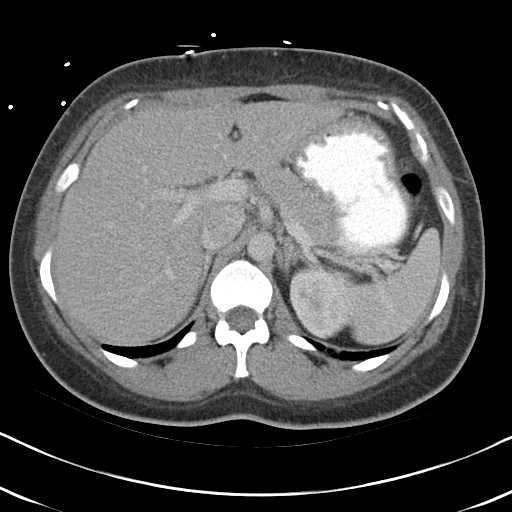
[im 63/92  bone]
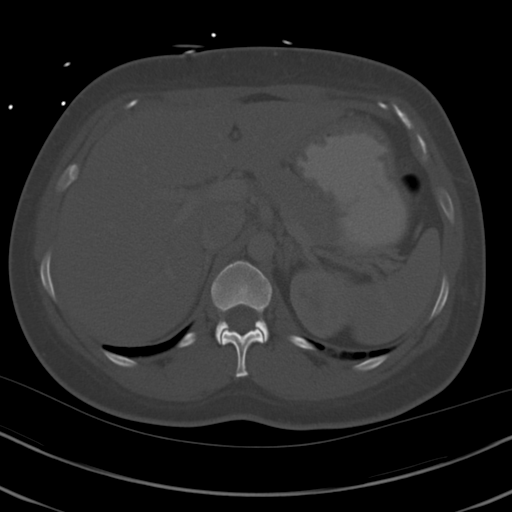
[im 69/92  soft-tissue]
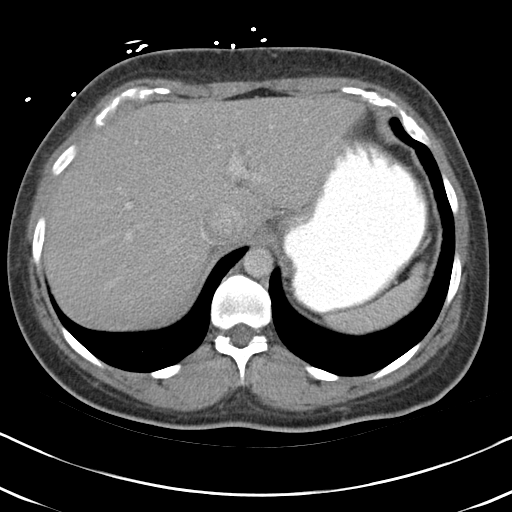
[im 69/92  lung]
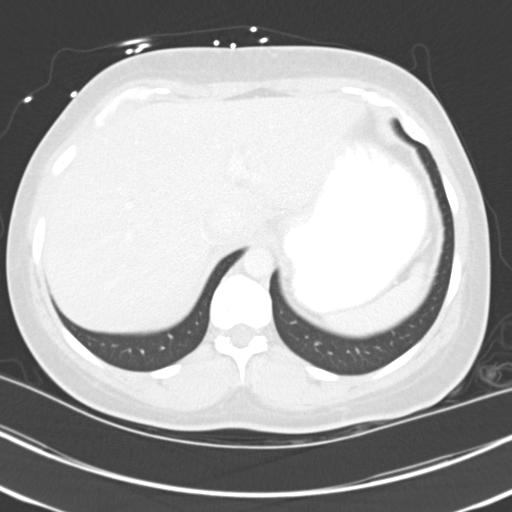
[im 74/92  lung]
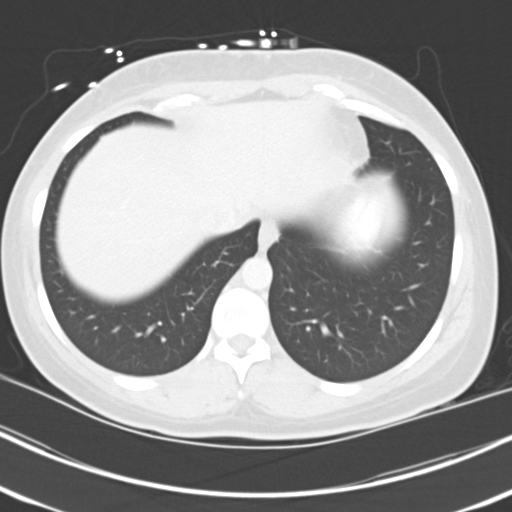
[im 80/92  soft-tissue]
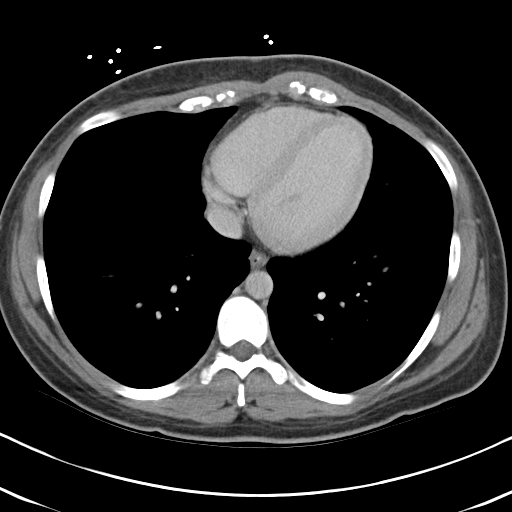
[im 80/92  lung]
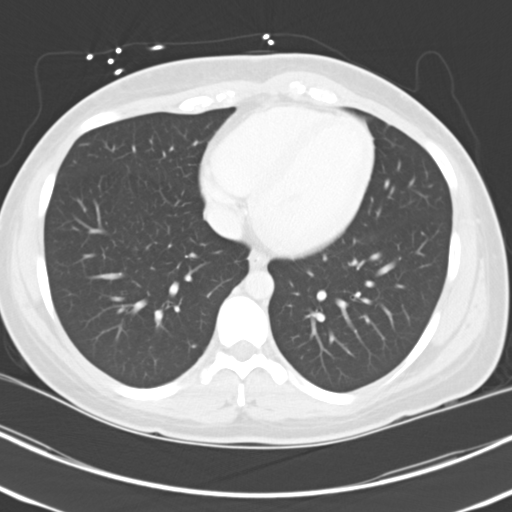
[im 86/92  soft-tissue]
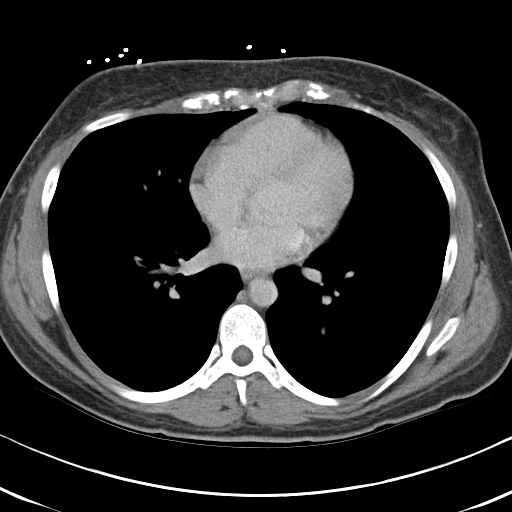
[im 86/92  lung]
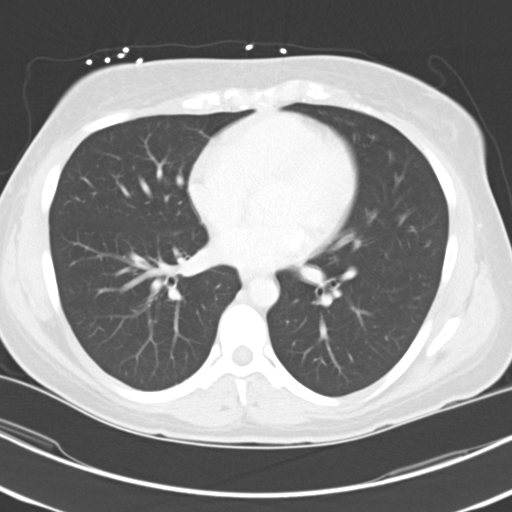

[13 of 32 positions shown; findings below may reference images not displayed]

FINDINGS: Right lung base likely calcified nodules most consistent
with old granulomatous disease. Normal heart size without
pericardial or pleural effusion.  Normal liver and spleen.  The
proximal stomach is normal.  There is underdistension of the
gastric body and antrum.  Suspect concurrent gastric wall
thickening on image 33.  The descending duodenum is underdistended.
Duodenal wall thickening cannot be entirely excluded.

Normal spleen.  Cholecystectomy without biliary ductal dilatation.
Normal adrenal glands.  Too small to characterize right renal
lesion which is likely a cyst. No retroperitoneal or retrocrural
adenopathy. Poor enteric opacification of distal small bowel loops
and the colon.  Appendix identified on image 65 and within normal
limits.  Normal terminal ileum. Normal abdominal small bowel
without ascites.
IMPRESSION: 1.  Underdistension of the distal stomach.  Suspect concurrent
gastric wall thickening consistent with the clinical history of
gastritis.
2.  Poor enteric opacification/distension of the duodenum and
distal bowel as described.  Duodenitis cannot be exclude
3.  Cholecystectomy without biliary ductal dilatation.

CT PELVIS
FINDINGS: Normal pelvic bowel loops.  No pelvic adenopathy.normal
urinary bladder and uterus.  No adnexal mass.  Trace free fluid
which is likely physiologic.  Apparent increased density anterior
to the uterus felt to be related to volume averaging with adjacent
urinary bladder and free pelvic fluid.  Image 80.  Prior left hip
pinning.  The pin traverses the posterior aspect left femoral neck
on image 88.
IMPRESSION: 1.  No definite acute pelvic process.
2.  Trace free fluid, likely physiologic.
3.  Apparent increased density adjacent to the uterus.  Most likely
volume averaging and periuterine free pelvic fluid.  If there are
pelvic symptoms, consider pelvic ultrasound to exclude inflammatory
process (i.e. endometritis or pelvic inflammatory disease.)

## 2008-06-13 ENCOUNTER — Encounter: Payer: Self-pay | Admitting: Cardiology

## 2008-06-13 ENCOUNTER — Ambulatory Visit: Payer: Self-pay | Admitting: Gastroenterology

## 2008-06-13 ENCOUNTER — Encounter: Payer: Self-pay | Admitting: Gastroenterology

## 2008-06-14 ENCOUNTER — Ambulatory Visit: Payer: Self-pay | Admitting: Gastroenterology

## 2008-06-14 ENCOUNTER — Observation Stay (HOSPITAL_COMMUNITY): Admission: EM | Admit: 2008-06-14 | Discharge: 2008-06-15 | Payer: Self-pay | Admitting: Emergency Medicine

## 2008-06-14 IMAGING — CR DG CHEST 2V
2 series · 2 of 2 positions shown · non-contrast
Comparison: None

CLINICAL DATA: Chest pain

CHEST - 2 VIEW

[view not recorded (1 of 2)]
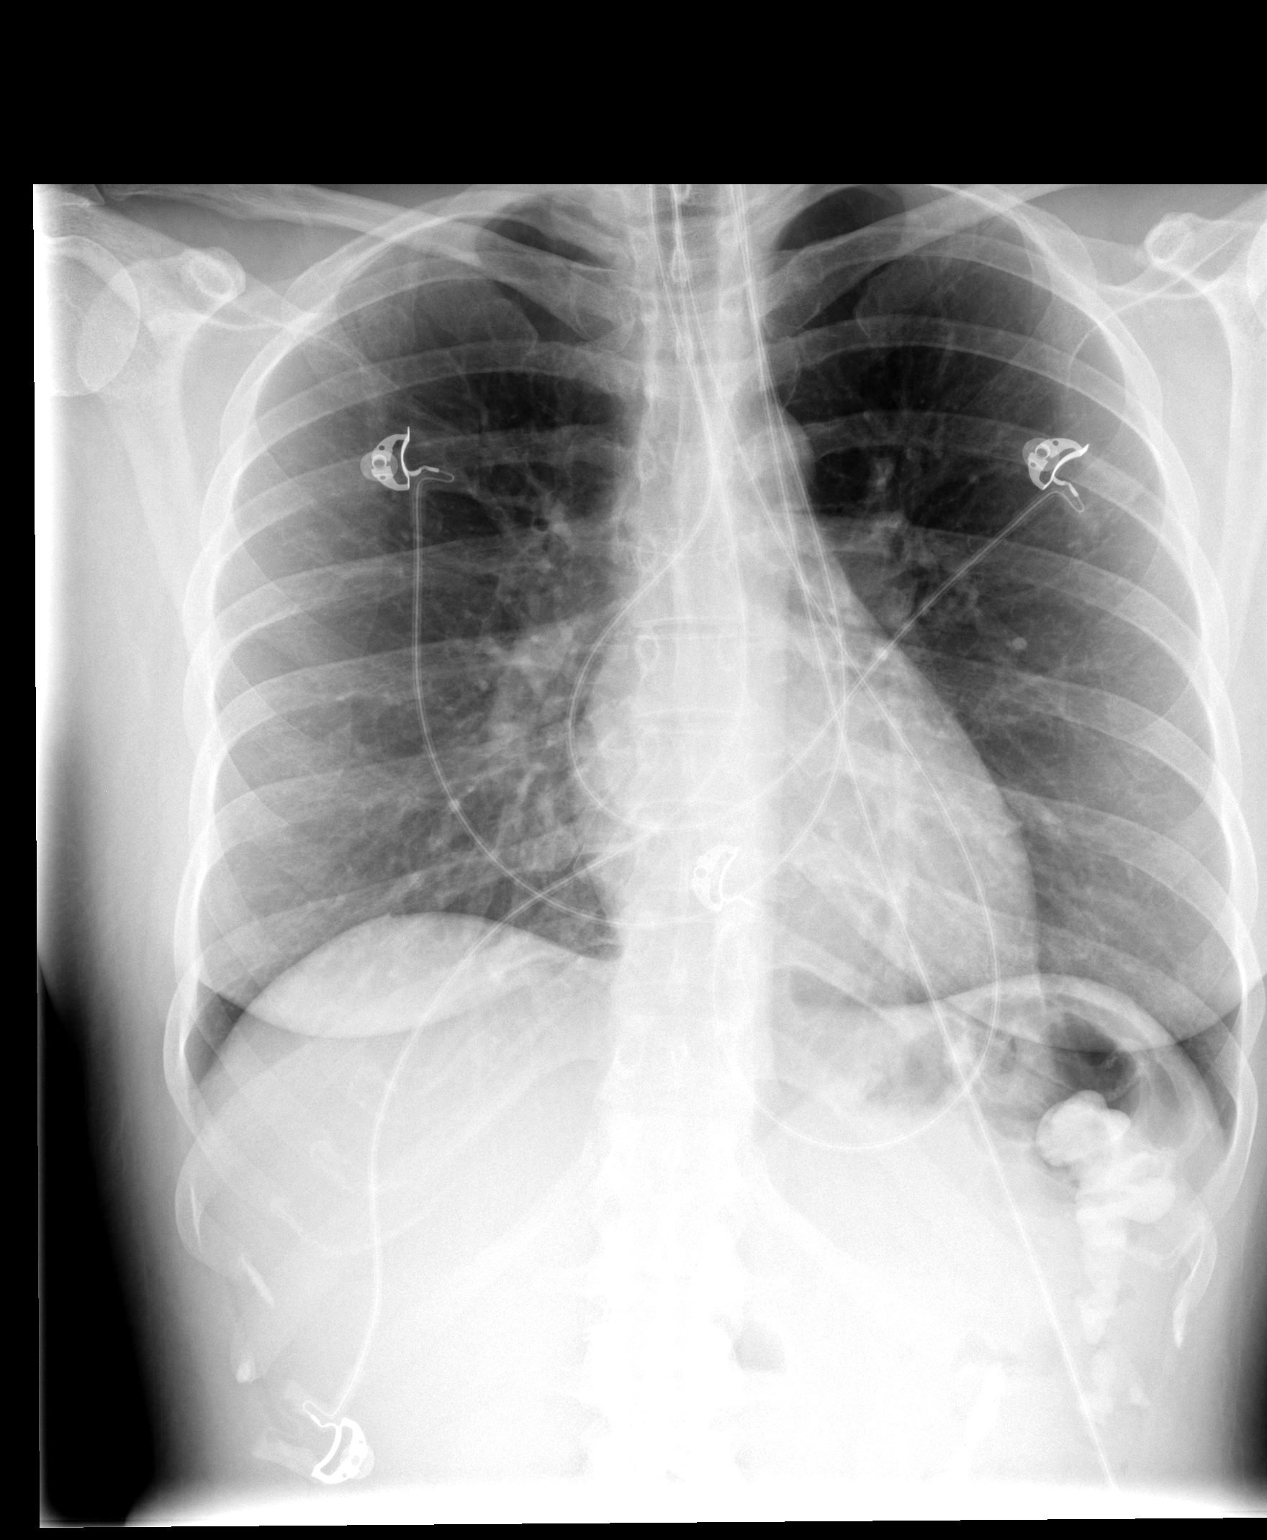

[view not recorded (2 of 2)]
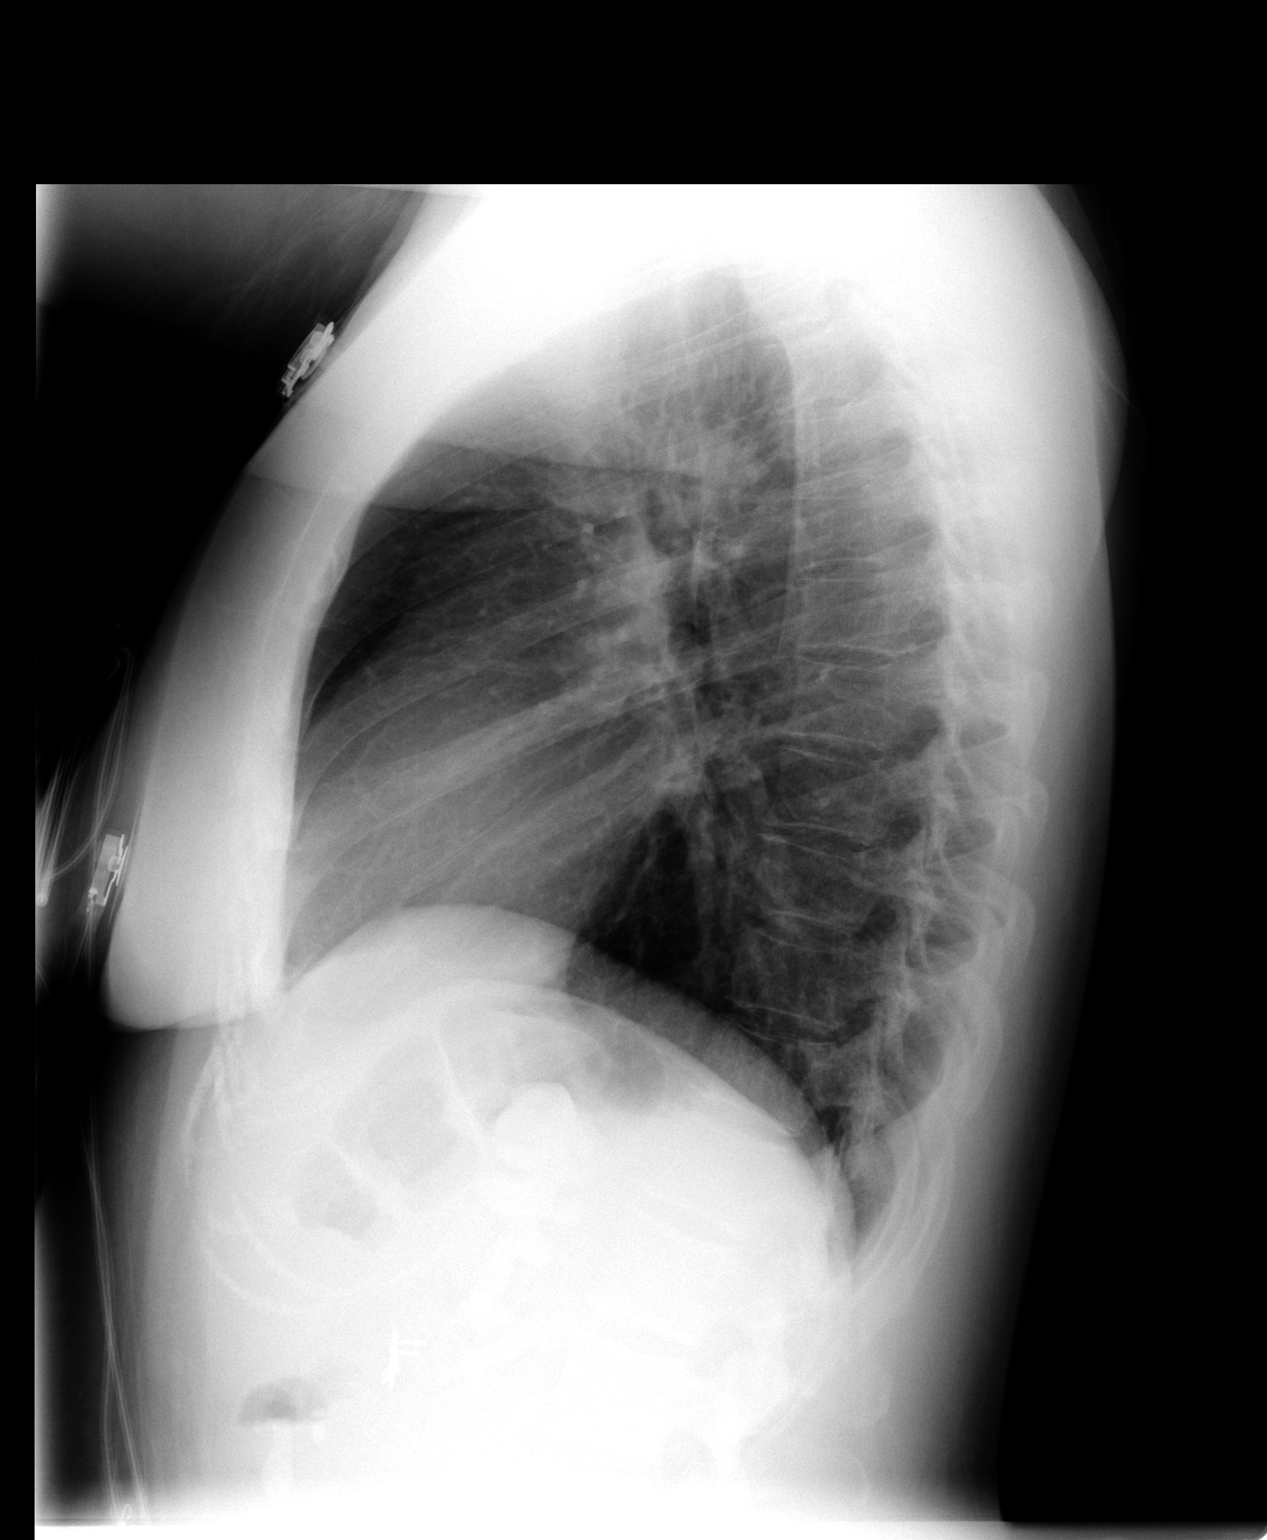

[2 of 2 positions shown; findings below may reference images not displayed]

FINDINGS: The cardiac silhouette, mediastinal and hilar contours
are within normal limits.  The lungs are clear.  The bony thorax is
intact.
IMPRESSION: 1.  No acute cardiopulmonary findings.

## 2008-06-14 IMAGING — CR DG ABDOMEN ACUTE W/ 1V CHEST
3 series · 3 of 3 positions shown · non-contrast
Comparison: [DATE]

CLINICAL DATA: Abdominal pain and vomiting

ACUTE ABDOMEN SERIES (ABDOMEN 2 VIEW & CHEST 1 VIEW)

[view not recorded (1 of 3)]
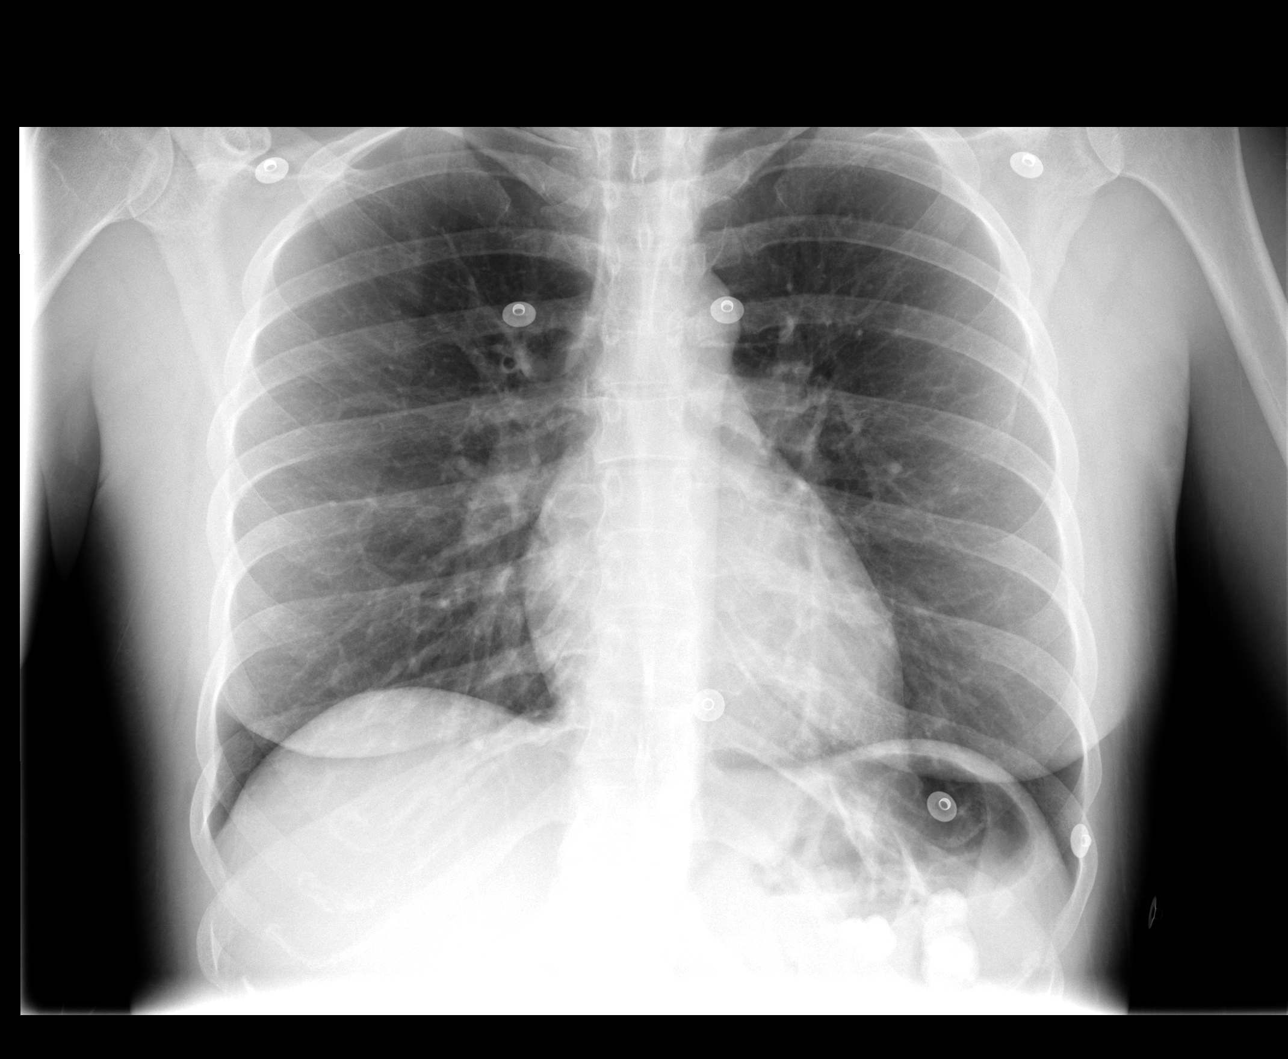

[view not recorded (2 of 3)]
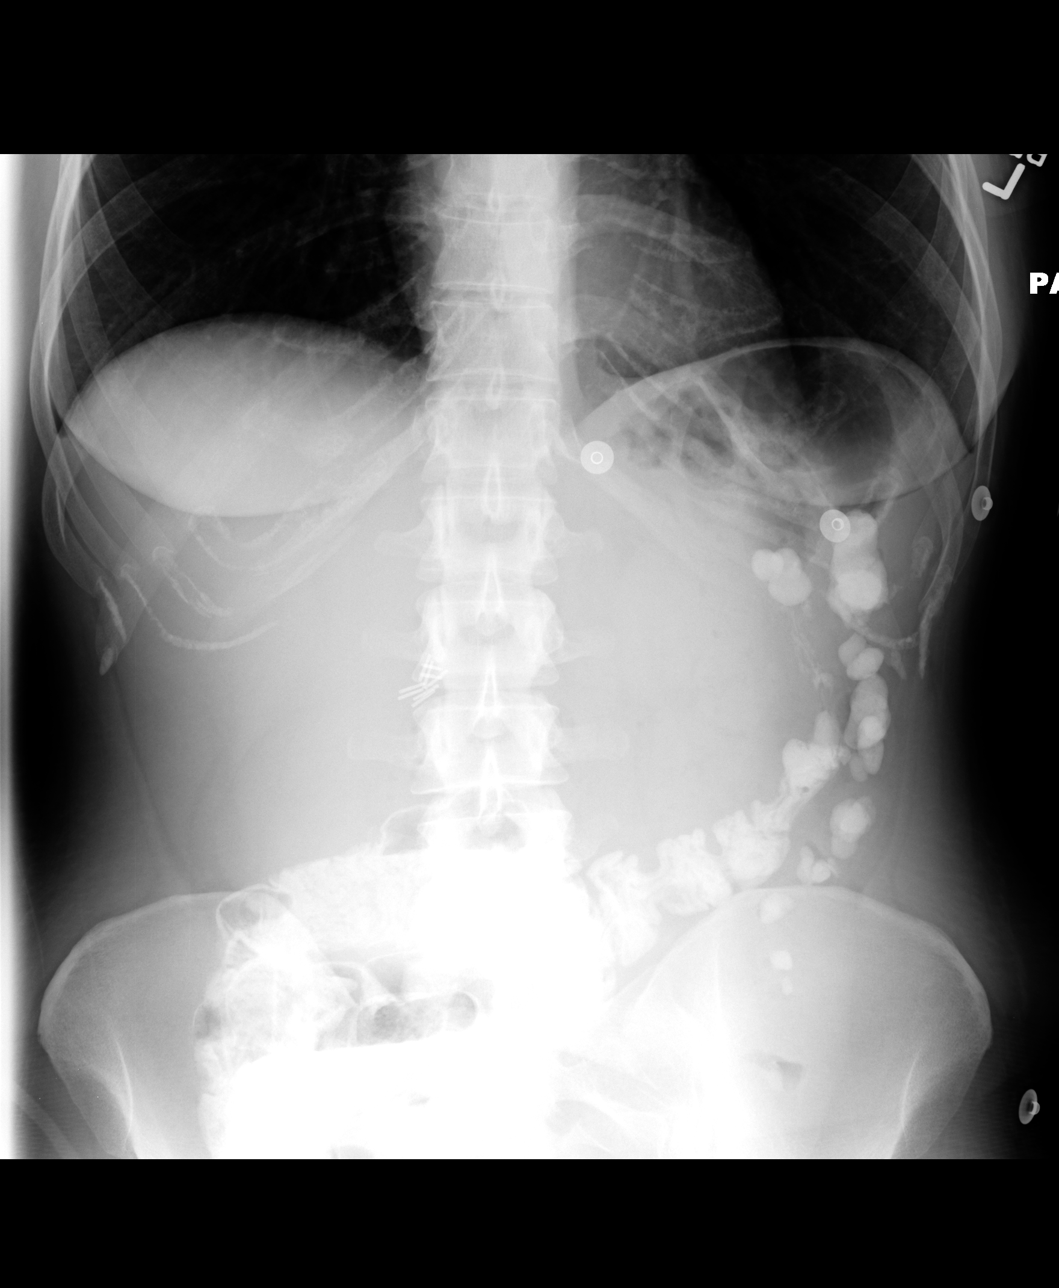

[view not recorded (3 of 3)]
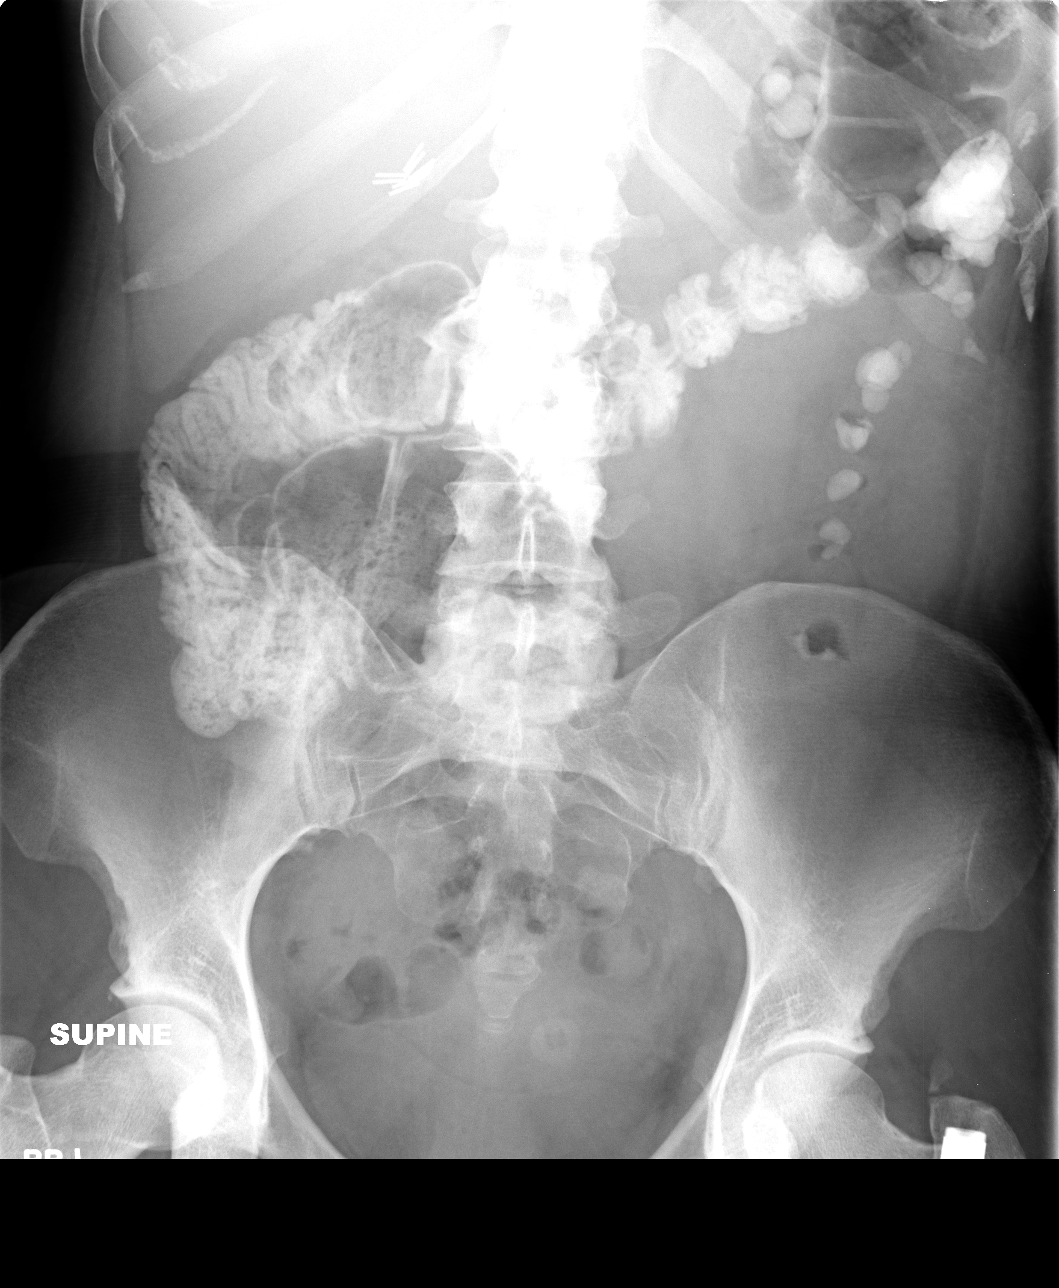

[3 of 3 positions shown; findings below may reference images not displayed]

FINDINGS: The heart size and mediastinal contours are within normal
limits.  Both lungs are clear.  The visualized skeletal structures
are unremarkable.

Cholecystectomy clips are identified within the right upper
quadrant

The bowel gas pattern is nonobstructive.

There is enteric contrast material within the colon from recent CT
examination.
IMPRESSION: 1.  Nonobstructive bowel gas pattern.
2.  No active cardiopulmonary disease.

## 2008-06-14 IMAGING — CT CT PELVIS W/ CM
1 of 3 series · 14 of 32 positions shown, 19 images · IV contrast (Omnipaque 300)
Comparison: [DATE]

CT ABDOMEN

CLINICAL DATA: Abdominal pain

CT ABDOMEN AND PELVIS WITH CONTRAST
TECHNIQUE: Multidetector CT imaging of the abdomen and pelvis was
performed using the standard protocol following bolus
administration of intravenous contrast.
Contrast: 100 ml [ZD]

[Series 2: abd_pel 5.0 b40f · axial · 0.69mm/px · z∈[-432,-32]mm · 14 of 90 slices shown, 19 images]
[im 5/90  soft-tissue]
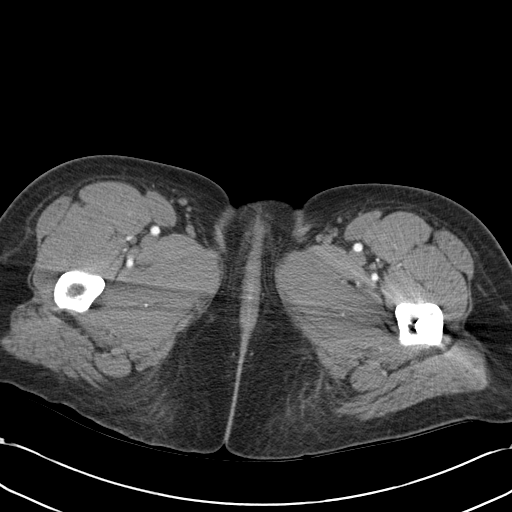
[im 5/90  bone]
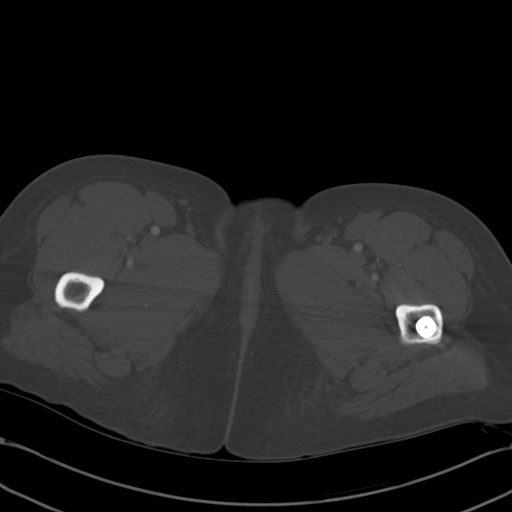
[im 10/90  soft-tissue]
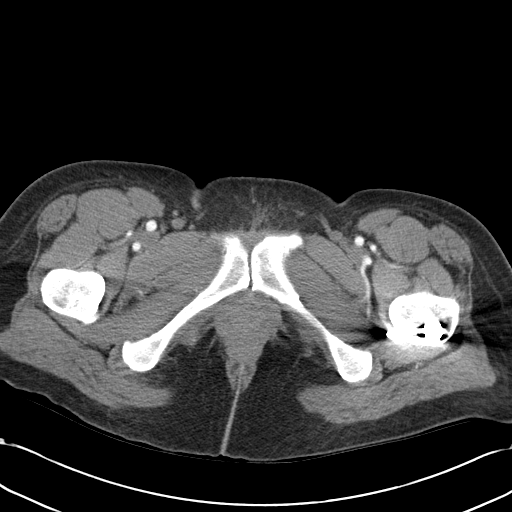
[im 20/90  soft-tissue]
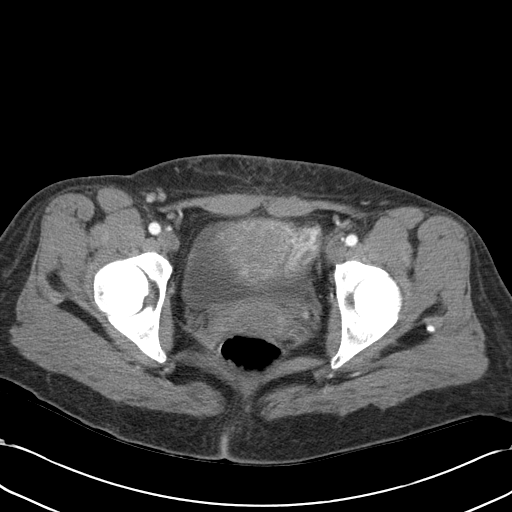
[im 25/90  soft-tissue]
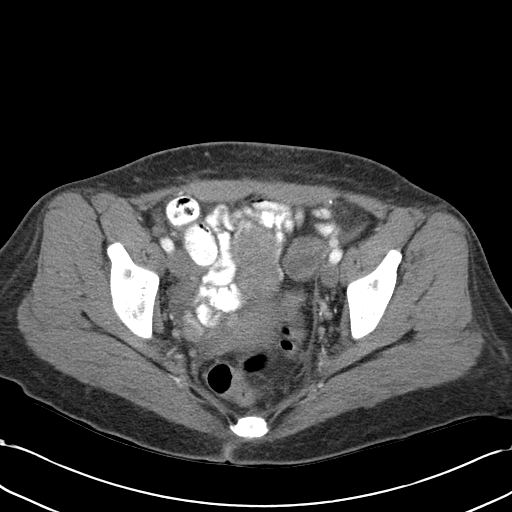
[im 30/90  soft-tissue]
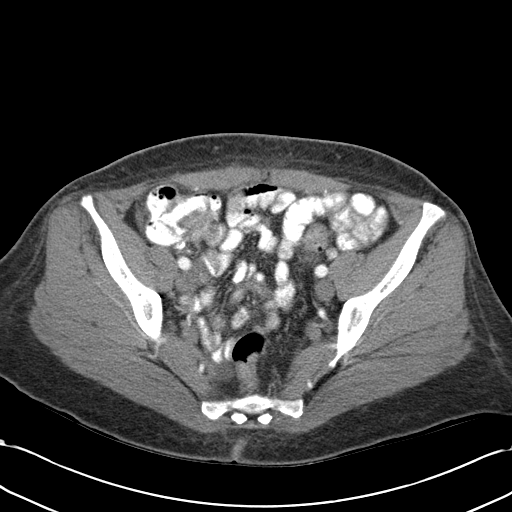
[im 40/90  soft-tissue]
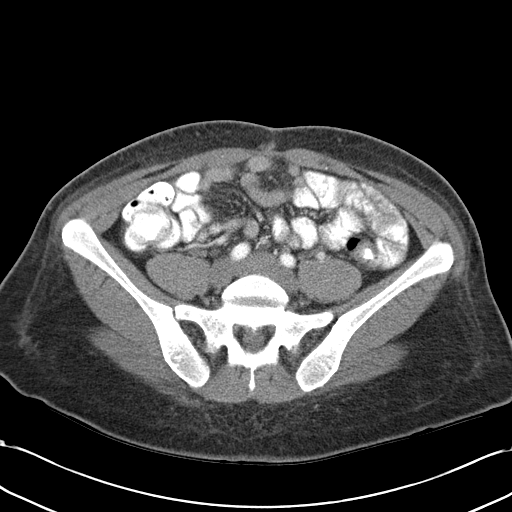
[im 45/90  soft-tissue]
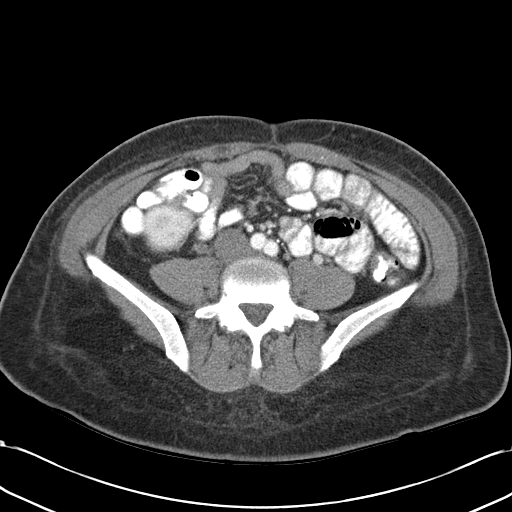
[im 50/90  soft-tissue]
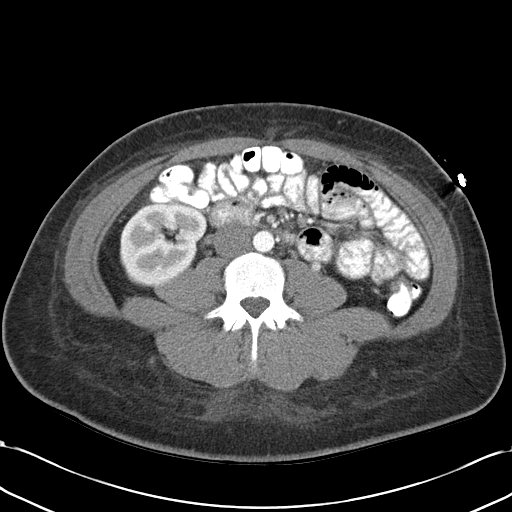
[im 60/90  soft-tissue]
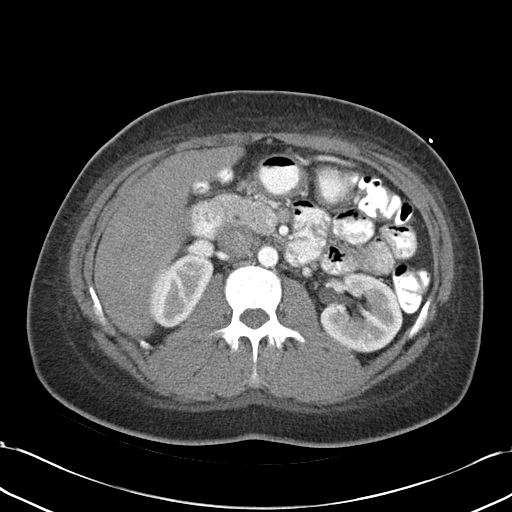
[im 60/90  bone]
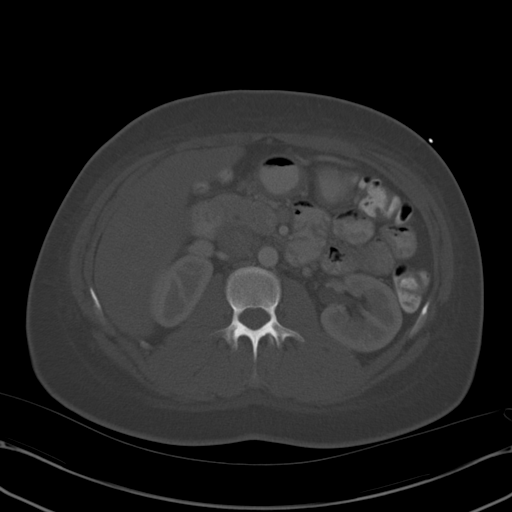
[im 65/90  soft-tissue]
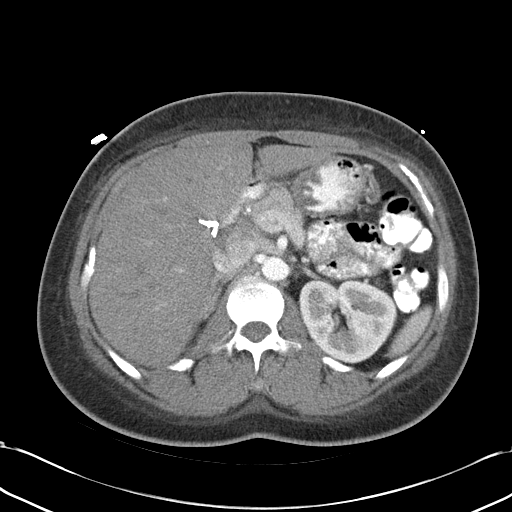
[im 70/90  soft-tissue]
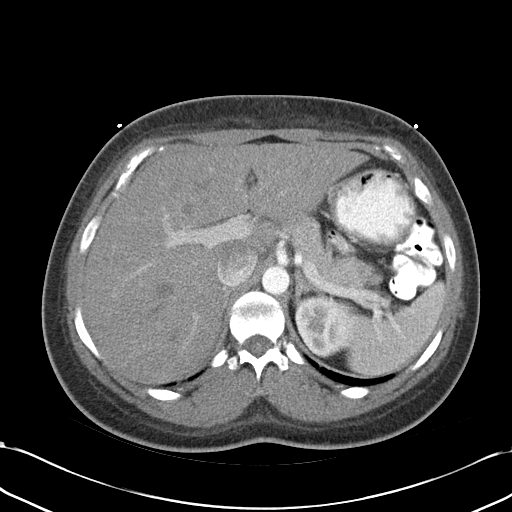
[im 70/90  lung]
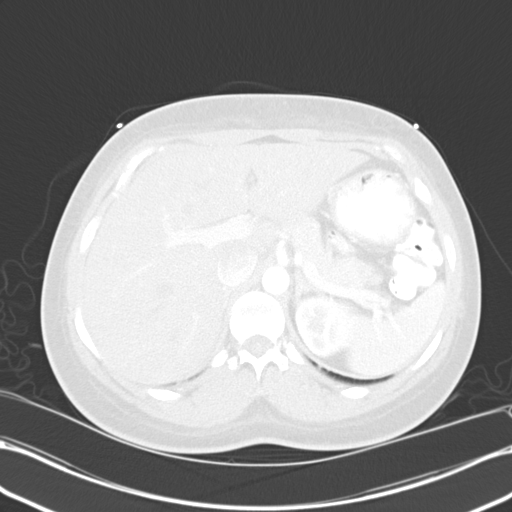
[im 75/90  lung]
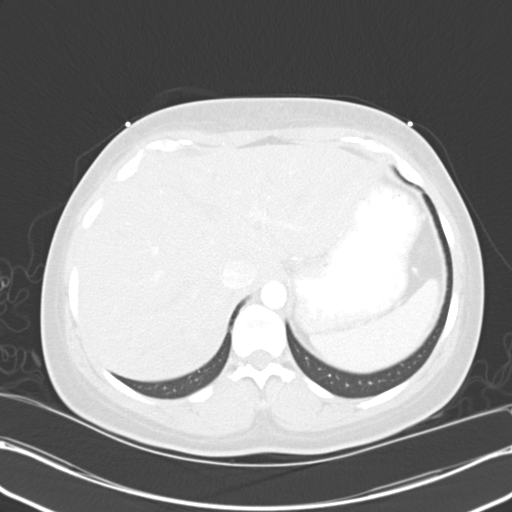
[im 80/90  soft-tissue]
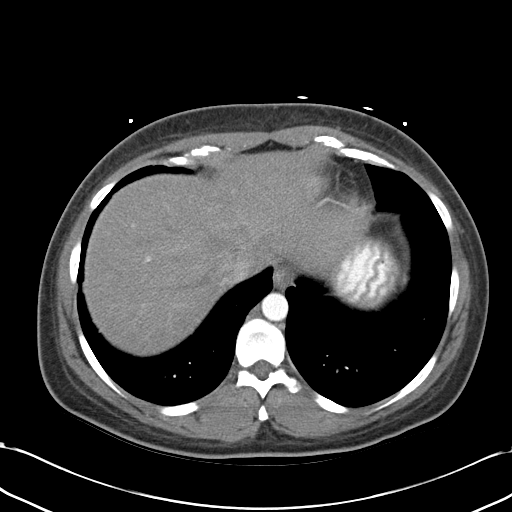
[im 80/90  lung]
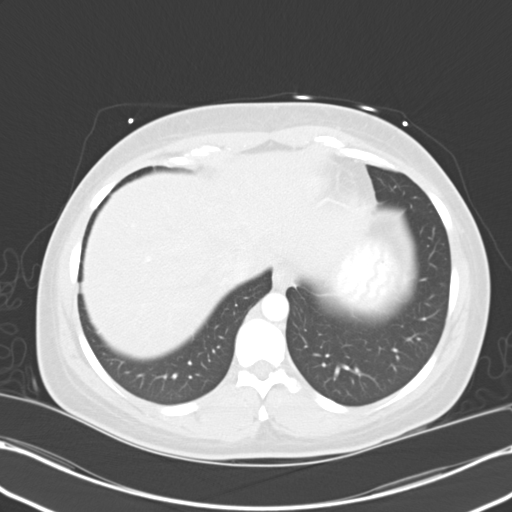
[im 85/90  soft-tissue]
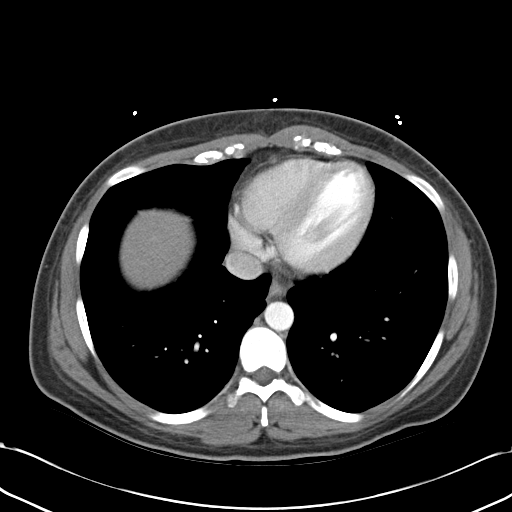
[im 85/90  lung]
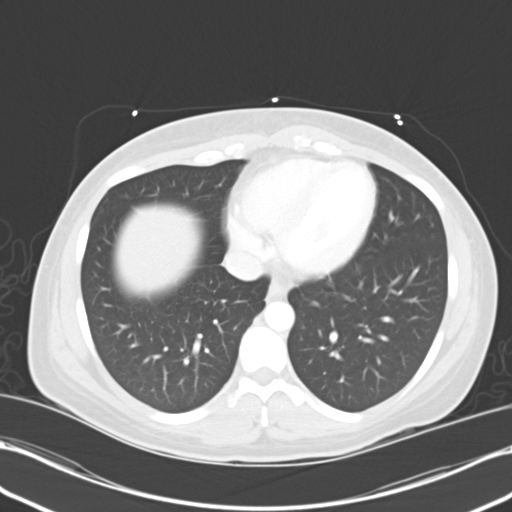

[14 of 32 positions shown; findings below may reference images not displayed]

FINDINGS: The lung bases are clear.  The liver is unremarkable.
No biliary dilatation or focal lesions.  The patient has had a
cholecystectomy.  The spleen is normal in size.  The pancreas,
adrenal glands and kidneys are unremarkable and stable.

The aorta is normal in caliber.  The major branch vessels are
normal.  No mesenteric or retroperitoneal masses or adenopathy.

The stomach, duodenum, small bowel and colon demonstrate no
significant abnormalities.

No significant bony findings.
IMPRESSION: 1.  Unremarkable CT examination of the abdomen.  No acute abdominal
findings.  No change since recent prior study.

CT PELVIS
FINDINGS: The rectum, sigmoid colon and visualized small bowel
loops unremarkable.  The appendix is visualized and is normal.  The
uterus and ovaries are unremarkable.  There is a small amount of
free, likely physiologic, fluid.

The bony pelvis is intact.  No inguinal mass or hernia.  The
bladder is unremarkable.
IMPRESSION: 1.  No acute pelvic findings, masses or adenopathy.
2.  Small amount of likely physiologic free fluid.

## 2008-06-16 ENCOUNTER — Emergency Department (HOSPITAL_COMMUNITY): Admission: EM | Admit: 2008-06-16 | Discharge: 2008-06-16 | Payer: Self-pay | Admitting: Emergency Medicine

## 2008-06-16 IMAGING — US US ABDOMEN COMPLETE
1 series · 14 of 25 positions shown · non-contrast
Comparison: CT abdomen [DATE], abdominal ultrasound [DATE].

CLINICAL DATA: 27-year-old female with abdominal pain, epigastric
pain.  Prior cholecystectomy.

ABDOMEN ULTRASOUND
TECHNIQUE: Complete abdominal ultrasound examination was performed
including evaluation of the liver, gallbladder, bile ducts,
pancreas, kidneys, spleen, IVC, and abdominal aorta.

[Series 1: unknown · 0.33mm/px · 14 of 50 slices shown]
[im 1/50]
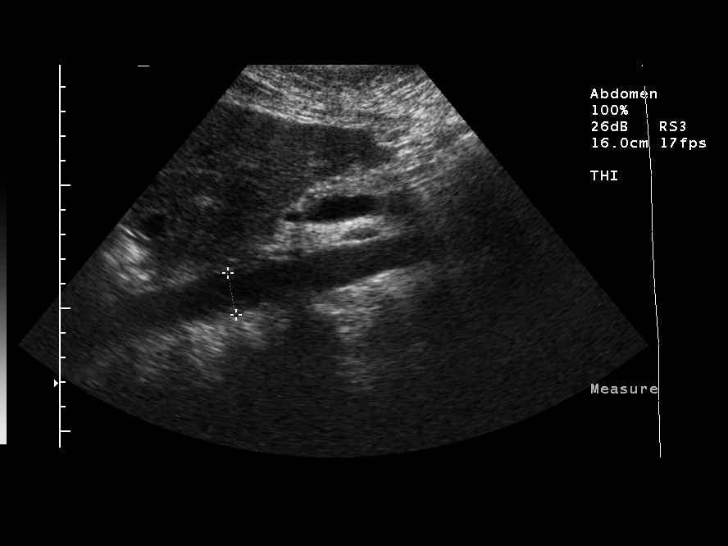
[im 5/50]
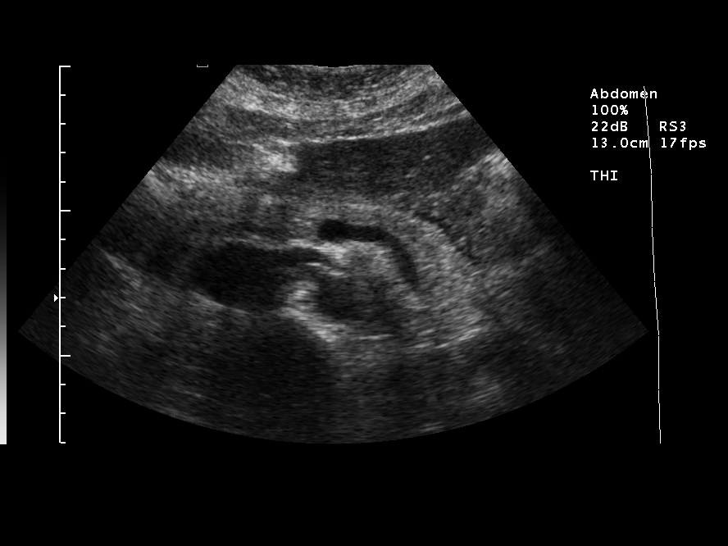
[im 9/50]
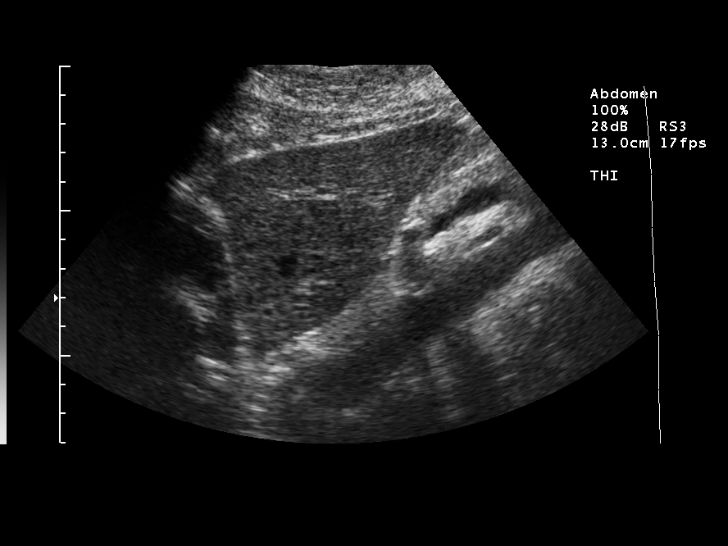
[im 13/50]
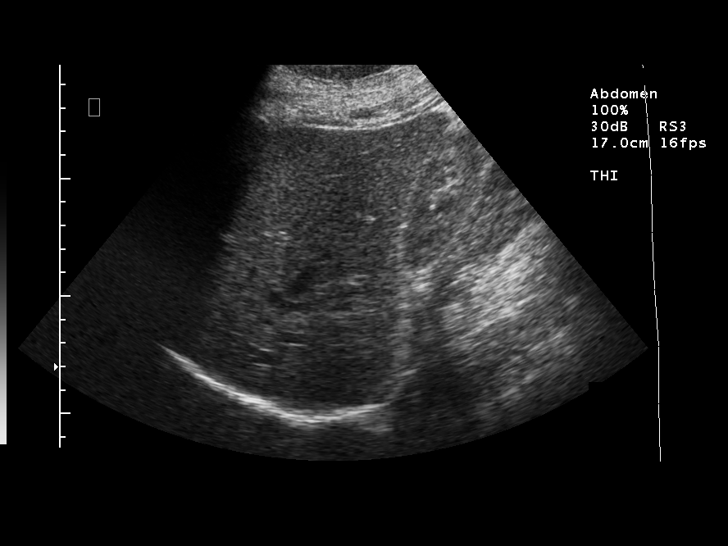
[im 17/50]
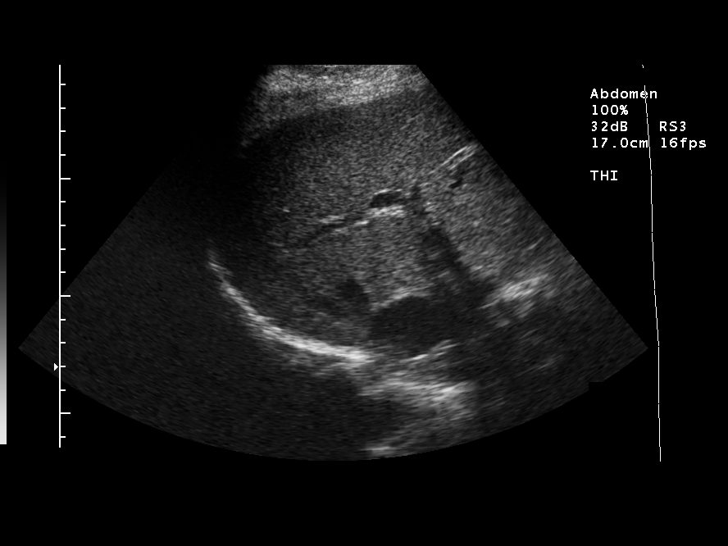
[im 19/50]
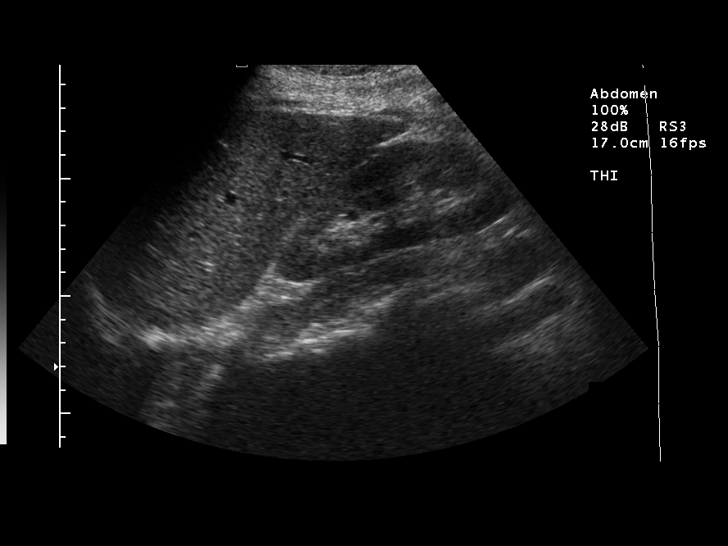
[im 23/50]
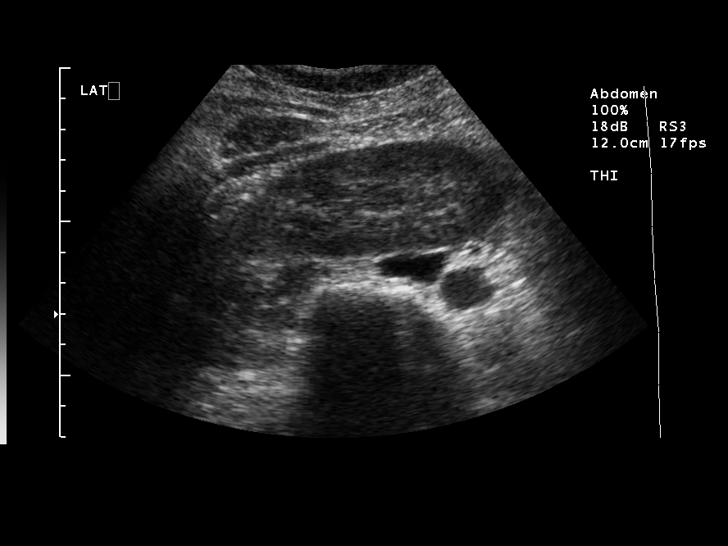
[im 27/50]
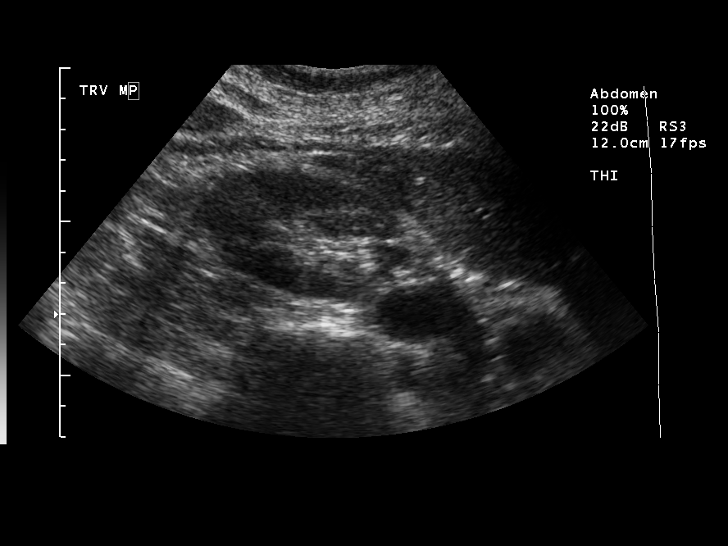
[im 31/50]
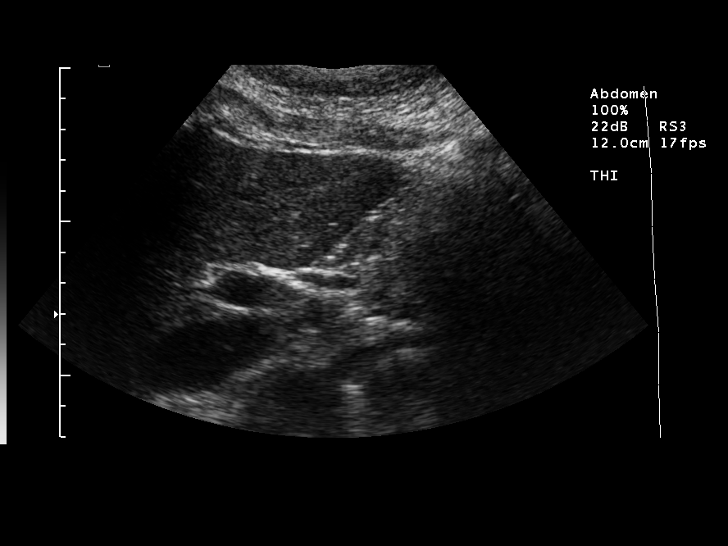
[im 33/50]
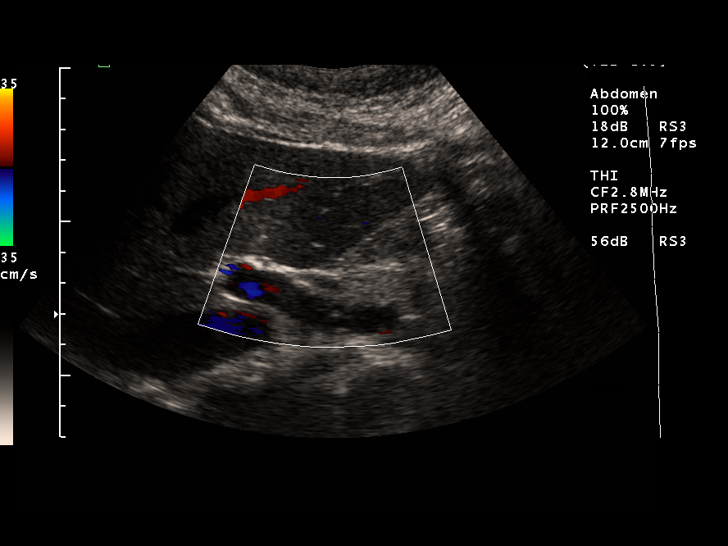
[im 37/50]
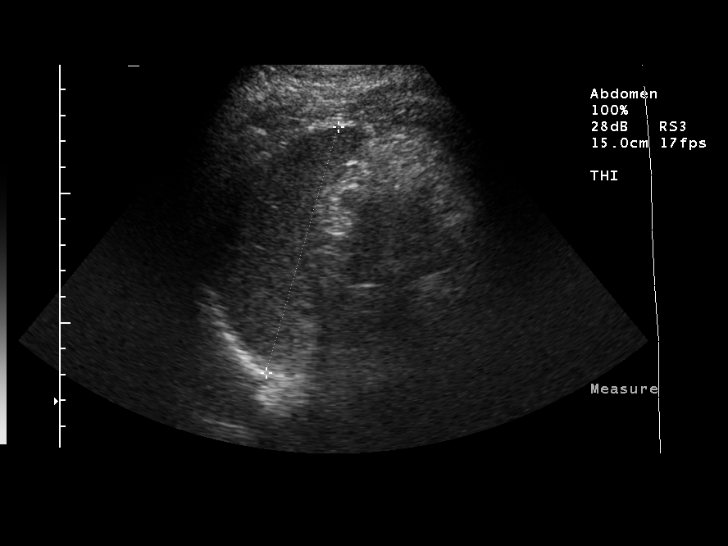
[im 41/50]
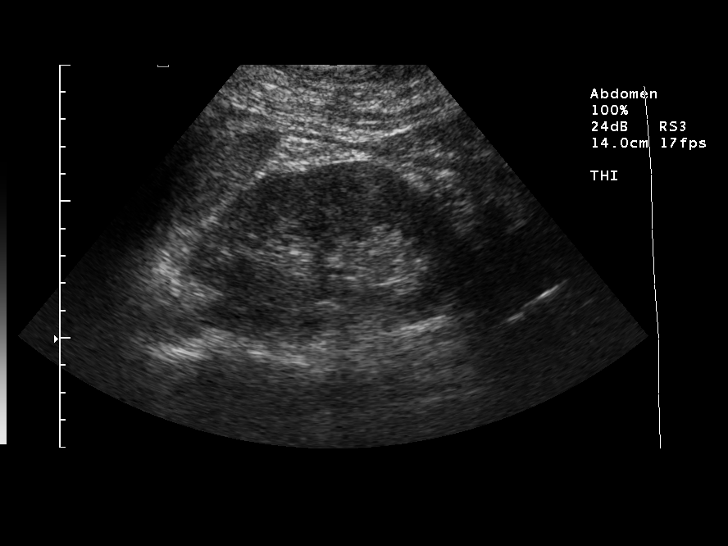
[im 45/50]
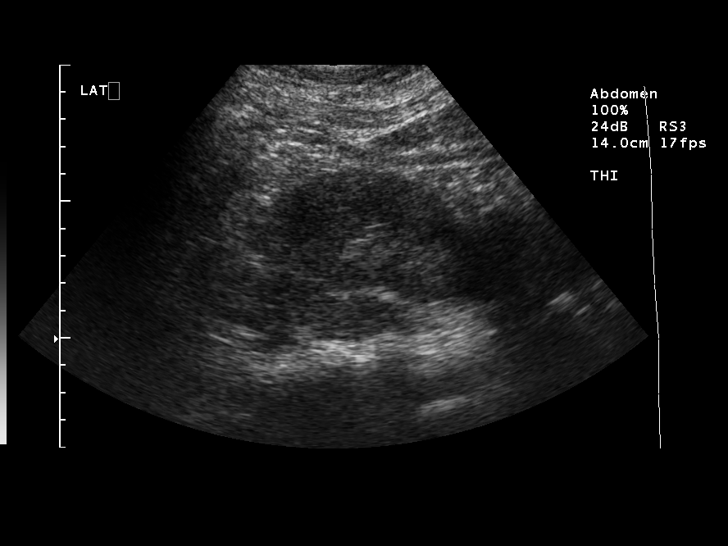
[im 50/50]
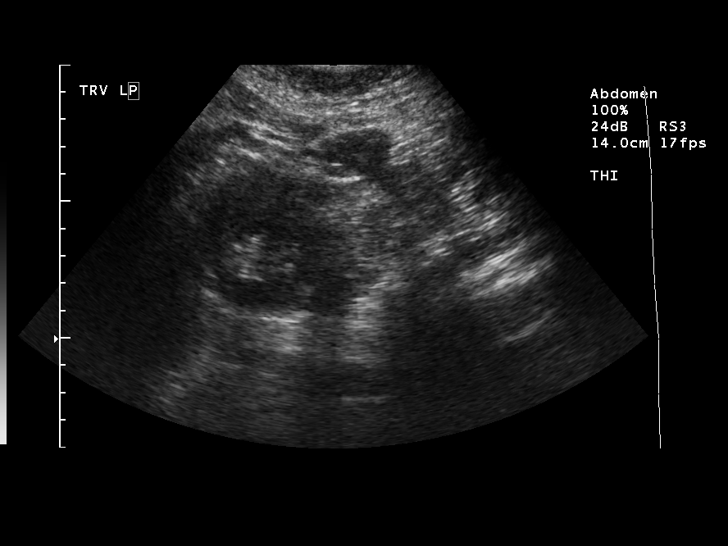

[14 of 25 positions shown; findings below may reference images not displayed]

FINDINGS: The gallbladder surgically absent.  Common bile duct is
nondilated at 4.1 mm.  Hepatic echotexture is within normal limits.
No liver lesion is identified.  The visualized inferior vena cava
and abdominal aorta are normal.  Aortic diameter measures up to
cm.  The visualized pancreatic parenchyma is within normal limits,
and stable without evidence of pancreatic edema.  The right kidney
is not obstructed measuring 11.6 cm in length (previously 11.1 cm)
and is within normal limits.  The left kidney is not obstructed
measuring 10.8 cm in length (previously 10.5 cm) and is within
normal limits.  The spleen is normal measuring 9.9 cm. No free
fluid is identified.
IMPRESSION: Stable, negative abdominal ultrasound status post cholecystectomy.

## 2008-06-16 IMAGING — CR DG ABDOMEN ACUTE W/ 1V CHEST
3 series · 3 of 3 positions shown · non-contrast
Comparison: [DATE] chest radiograph and CT abdomen, pelvis.

CLINICAL DATA: 27-year-old female with chest and abdominal pain.
History of cholecystectomy.

ACUTE ABDOMEN SERIES (ABDOMEN 2 VIEW & CHEST 1 VIEW)

[view not recorded (1 of 3)]
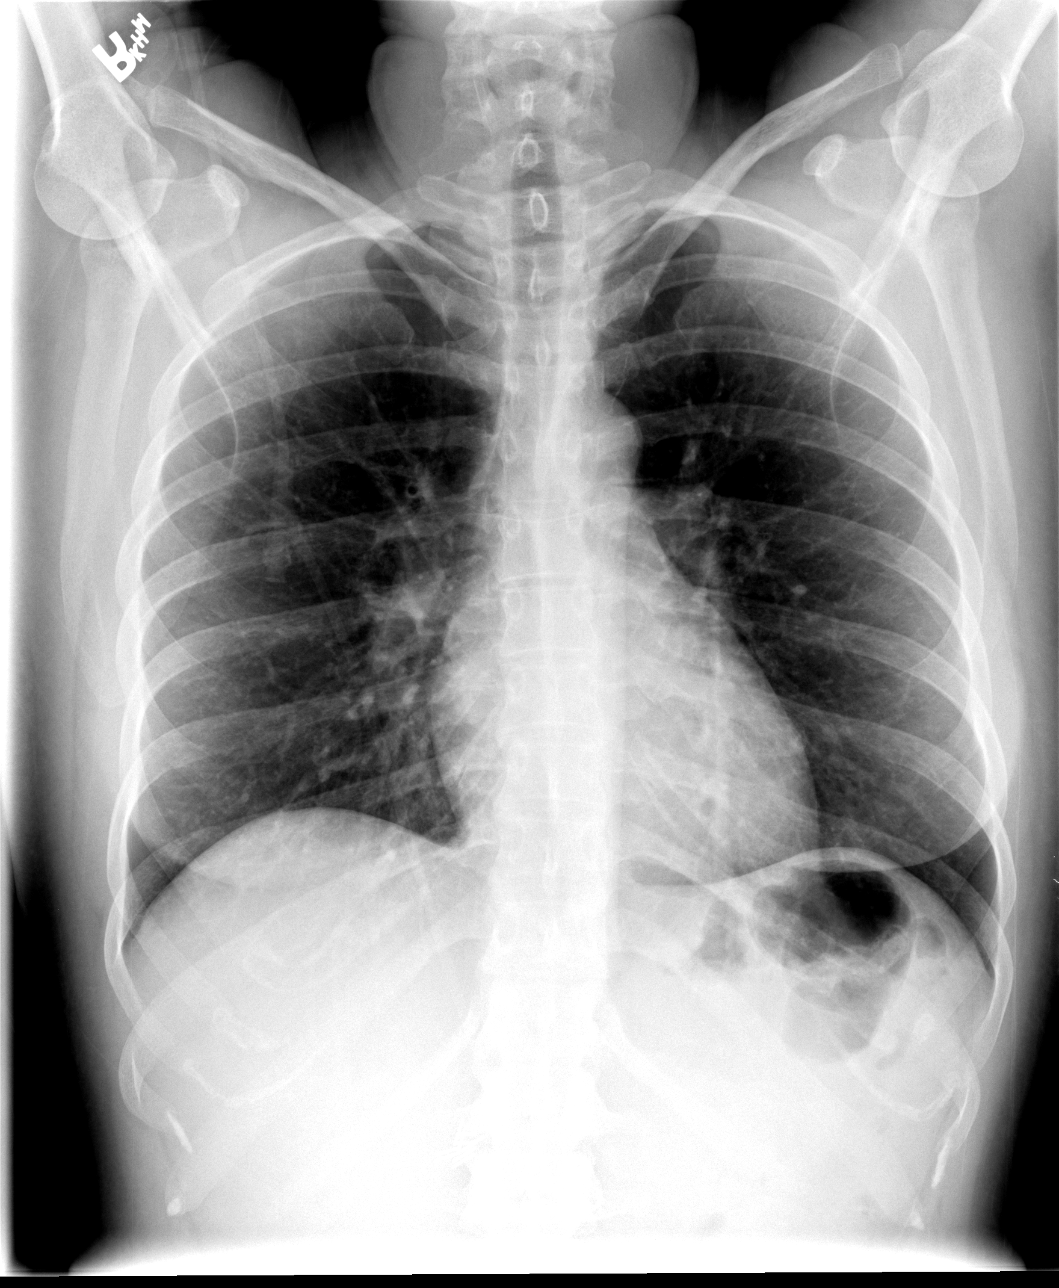

[view not recorded (2 of 3)]
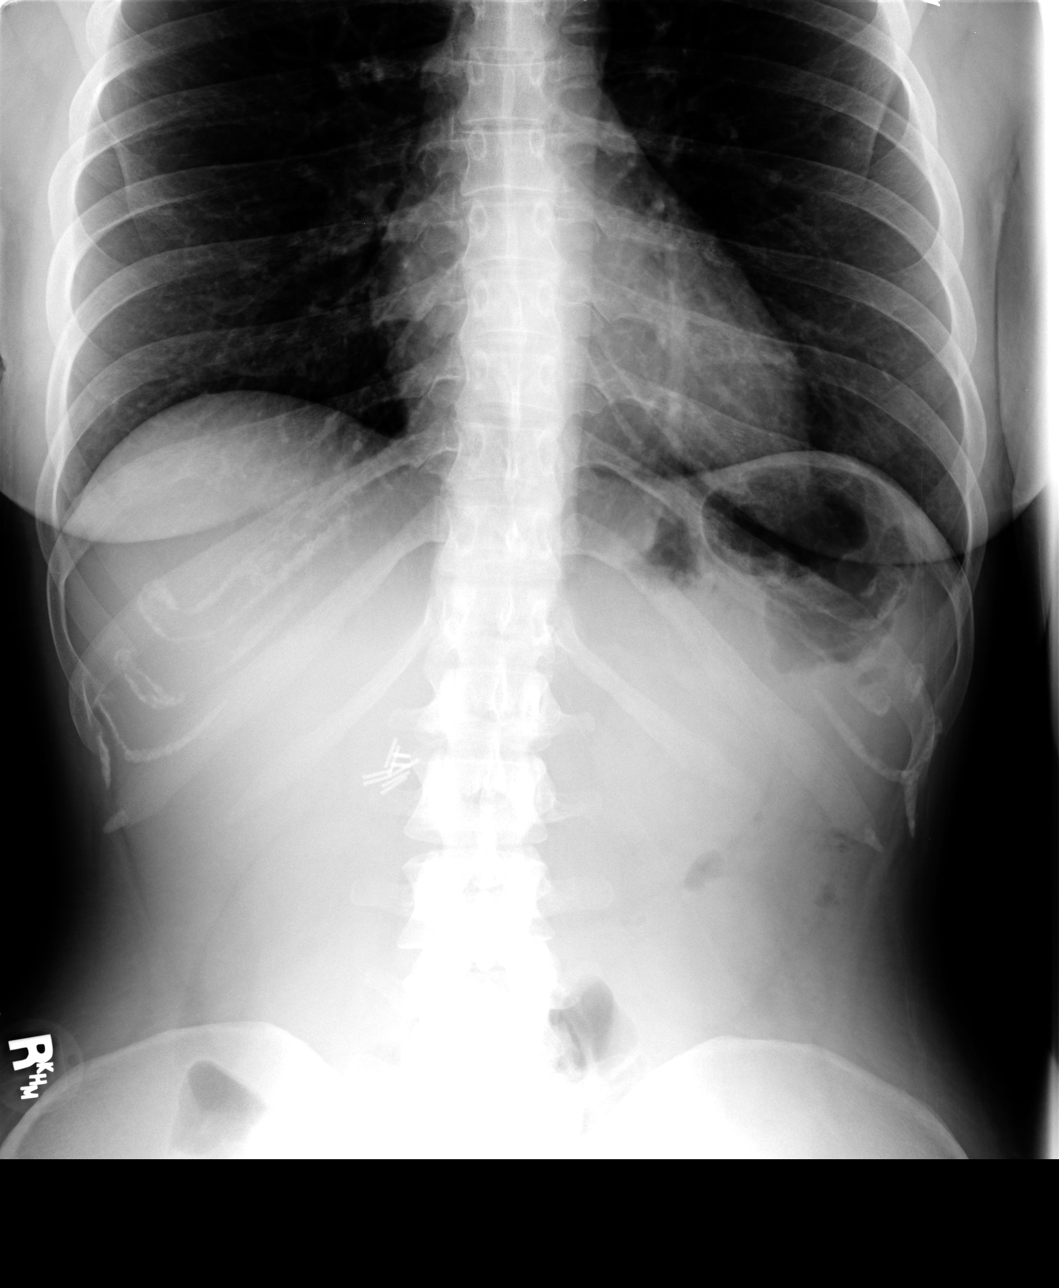

[view not recorded (3 of 3)]
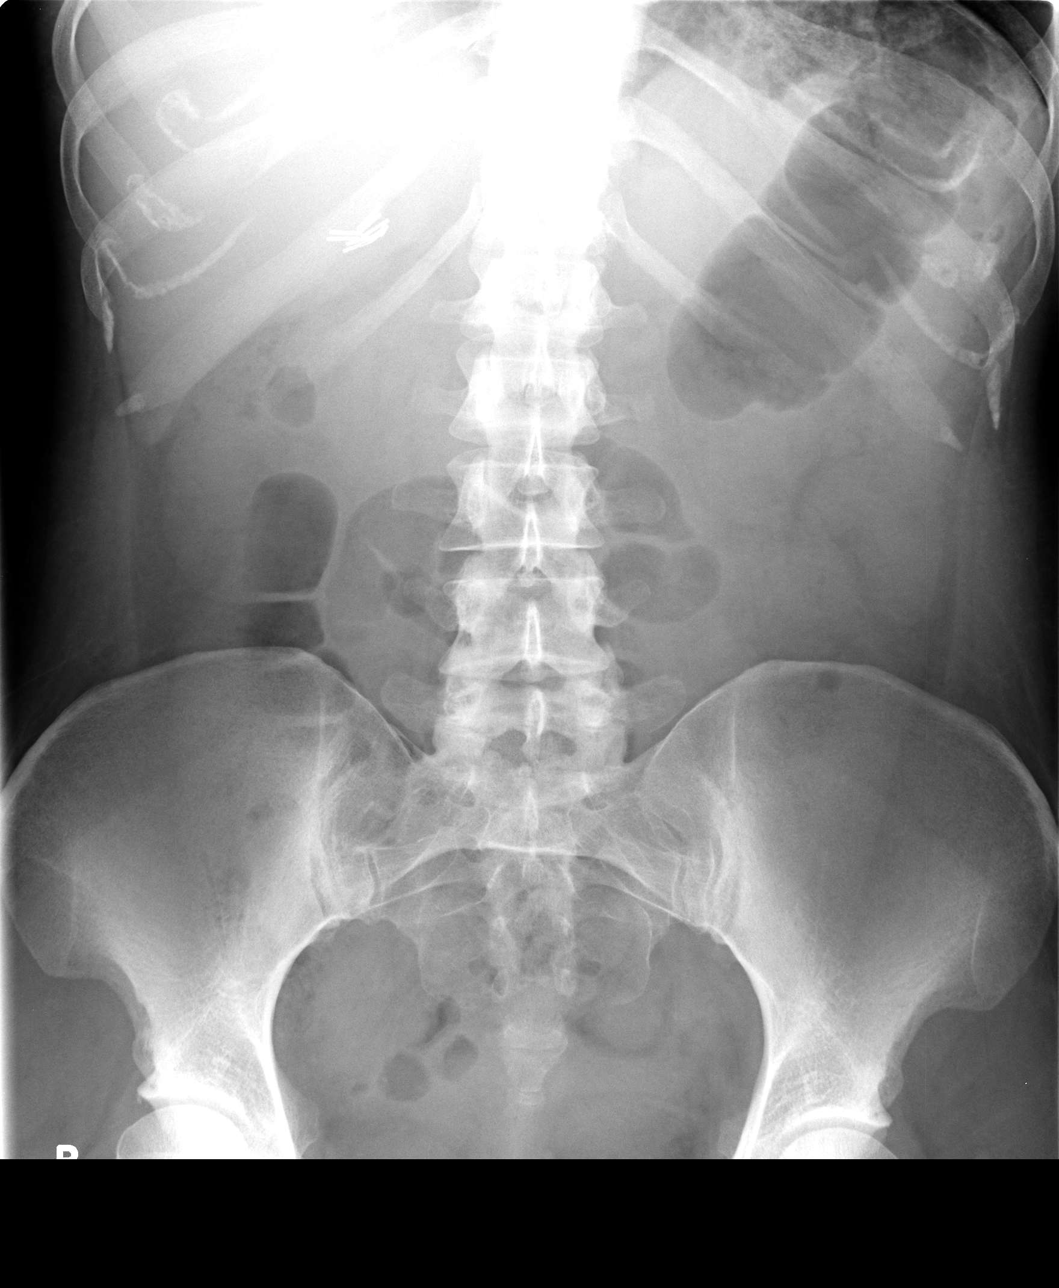

[3 of 3 positions shown; findings below may reference images not displayed]

FINDINGS: Normal lung volumes.  Normal cardiac size and mediastinal
contours.  No pneumothorax, pulmonary edema, pleural effusion or
focal airspace opacity.  No pneumoperitoneum.

Surgical clips are unchanged in configuration in the right upper
quadrant.  Visceral contours are within normal limits.  There is a
slight paucity of bowel gas.  Several nondilated large and small
bowel loops are evident on the supine view.  There may be retained
stool in the colon partially responsible for this appearance.  No
osseous abnormality identified.
IMPRESSION: 1. No acute cardiopulmonary abnormality.
2.  No free air.  Slight paucity of bowel gas without strong
evidence of mechanical obstruction.  Please note, this patient had
a CT of the abdomen and pelvis 2 days ago, and also 2 days before
that (please see those reports).

## 2009-01-30 ENCOUNTER — Ambulatory Visit (HOSPITAL_COMMUNITY): Admission: RE | Admit: 2009-01-30 | Discharge: 2009-01-30 | Payer: Self-pay | Admitting: Family Medicine

## 2009-01-30 IMAGING — CR DG ANKLE COMPLETE 3+V*L*
2 series · 2 of 2 positions shown · non-contrast
Comparison: None

CLINICAL DATA: Left ankle pain and swelling, injury

LEFT ANKLE COMPLETE - 3+ VIEW

[view not recorded (1 of 2)]
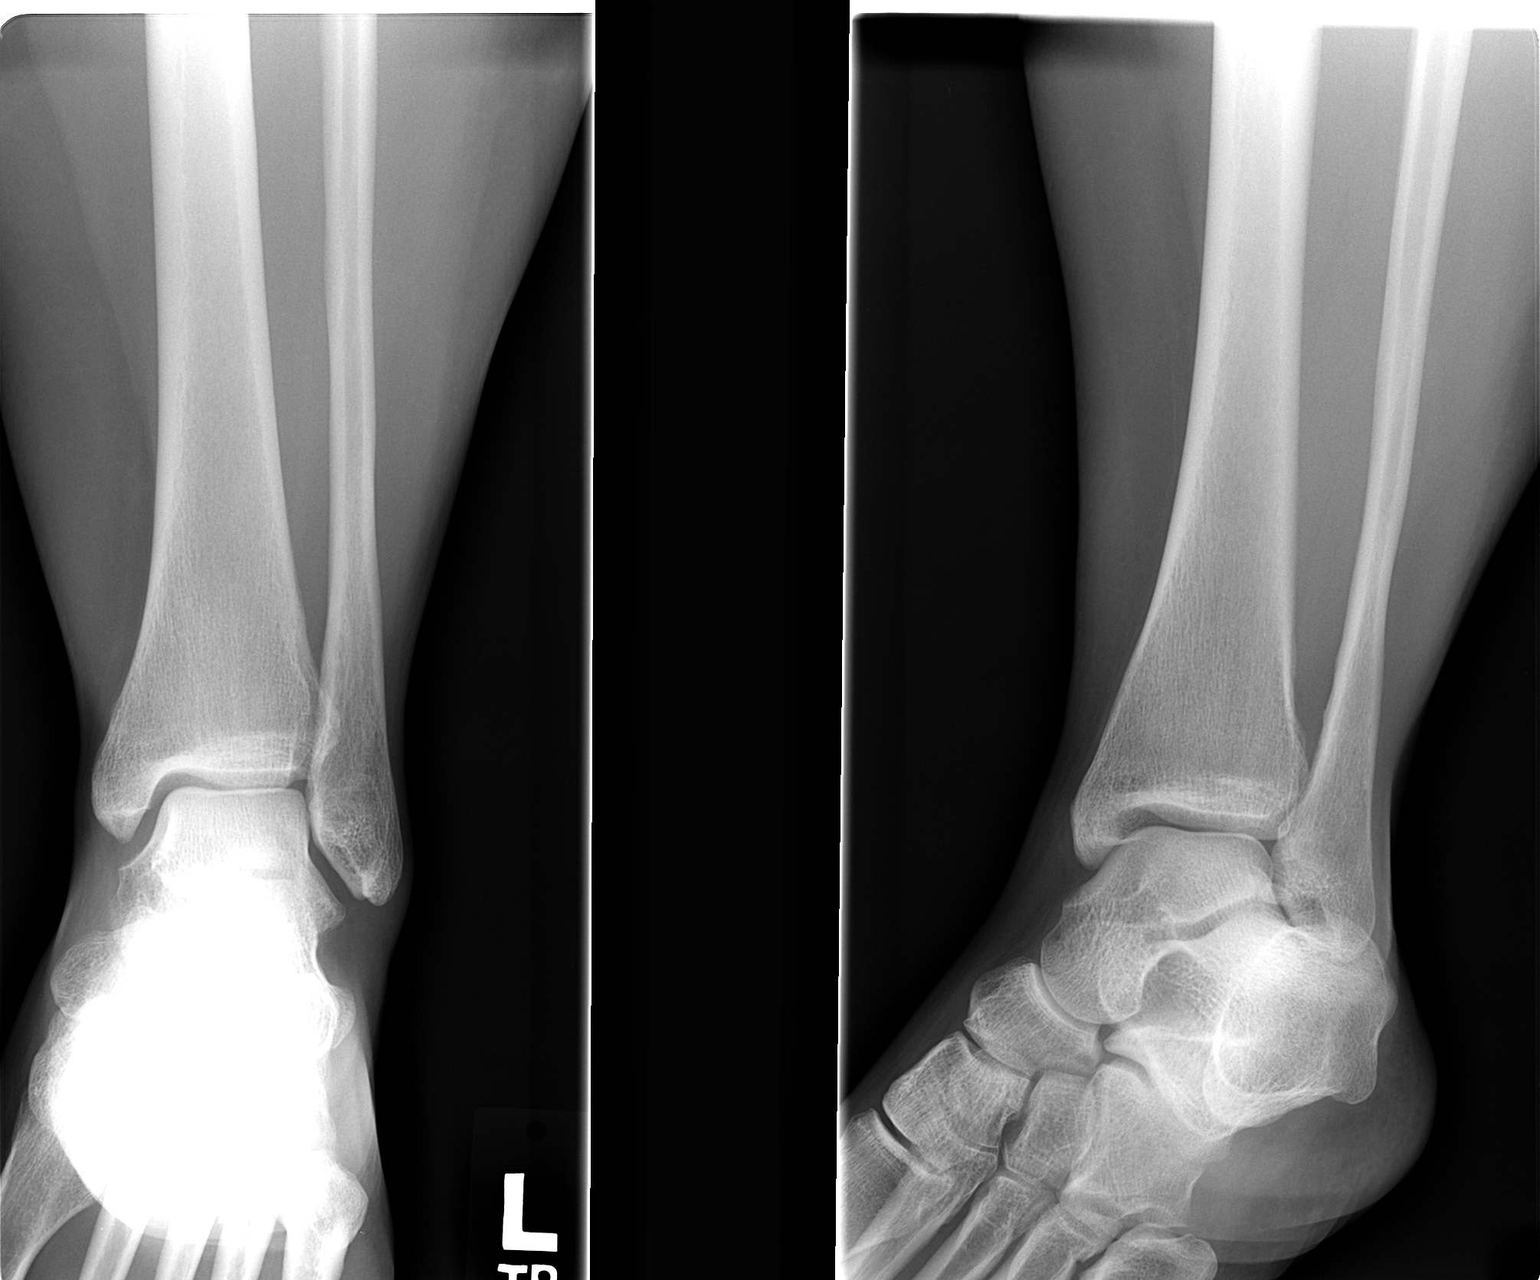

[view not recorded (2 of 2)]
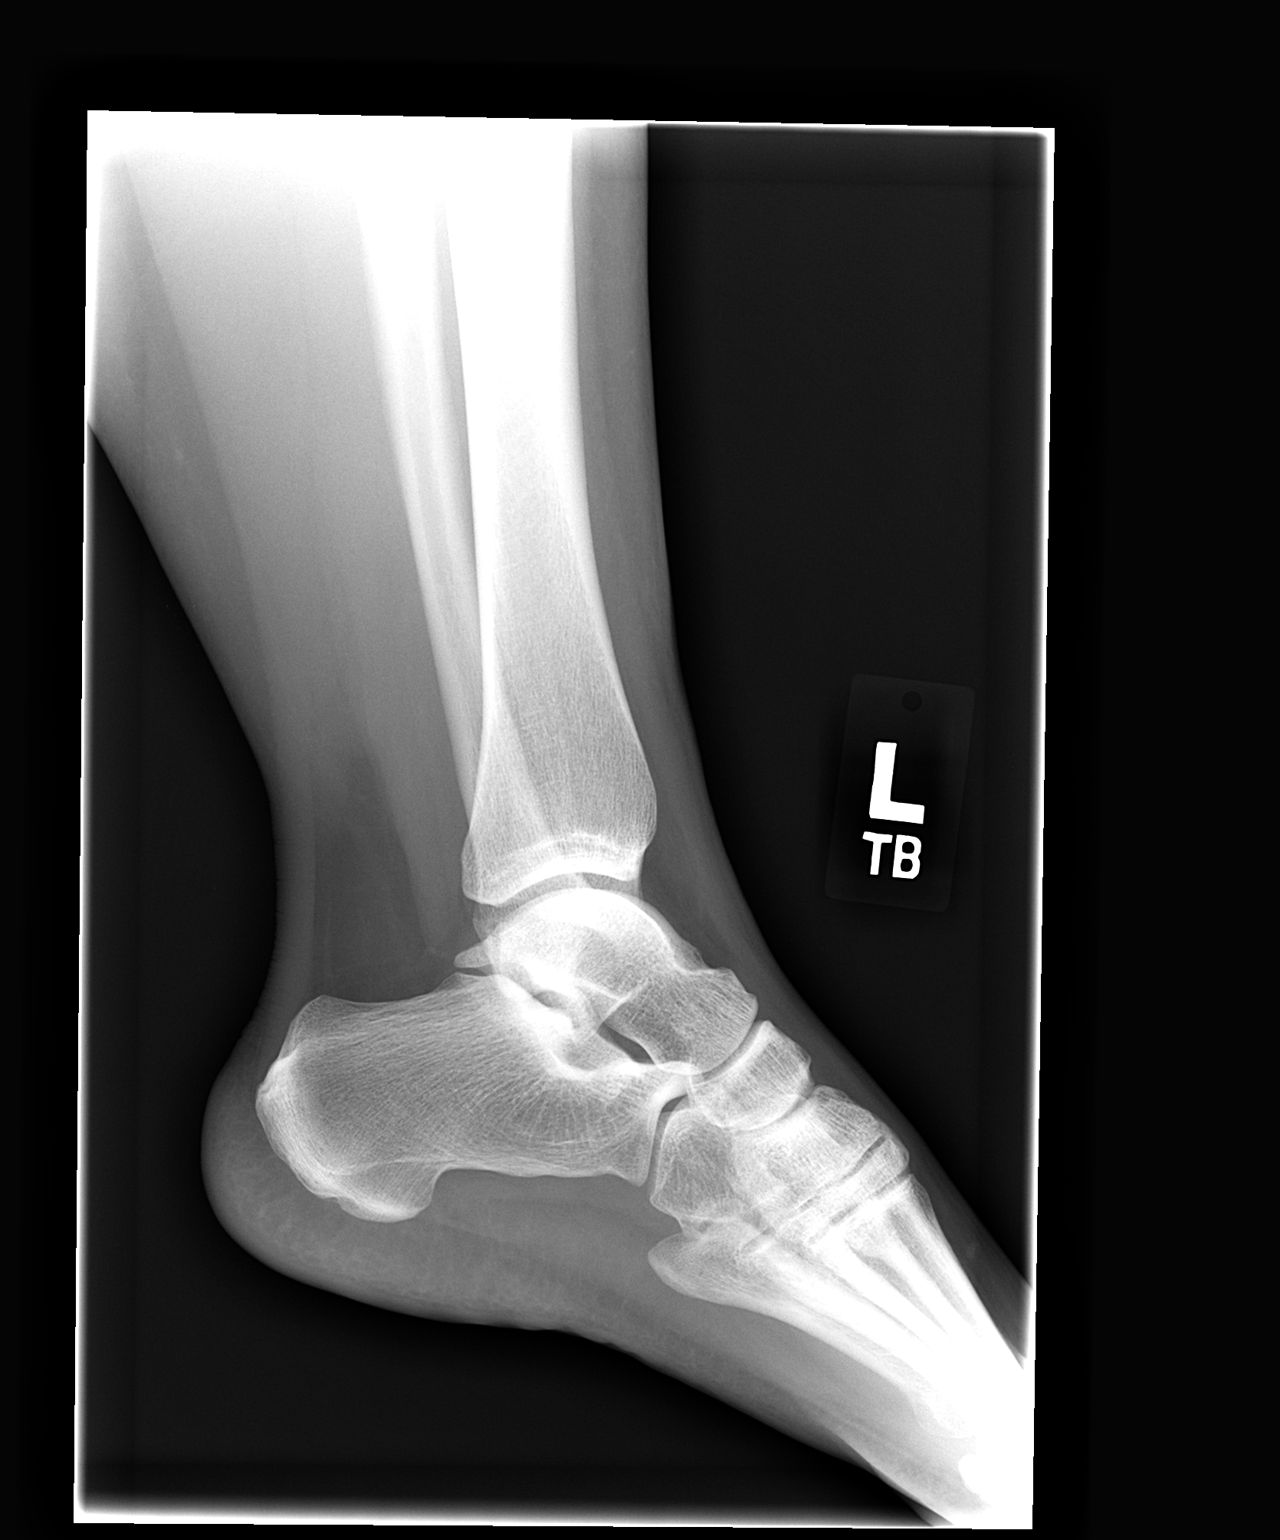

[2 of 2 positions shown; findings below may reference images not displayed]

FINDINGS: Ankle mortise intact.
Bone mineralization normal.
No acute fracture, dislocation, or bone destruction.
IMPRESSION: No acute bony abnormalities.

## 2009-10-30 ENCOUNTER — Emergency Department (HOSPITAL_COMMUNITY): Admission: EM | Admit: 2009-10-30 | Discharge: 2009-10-30 | Payer: Self-pay | Admitting: Emergency Medicine

## 2009-10-30 IMAGING — CR DG HAND COMPLETE 3+V*L*
3 series · 3 of 3 positions shown · non-contrast
Comparison: None

CLINICAL DATA: Hyperextension injury - pain and third and fourth
digits

LEFT HAND - COMPLETE 3+ VIEW

[view not recorded (1 of 3)]
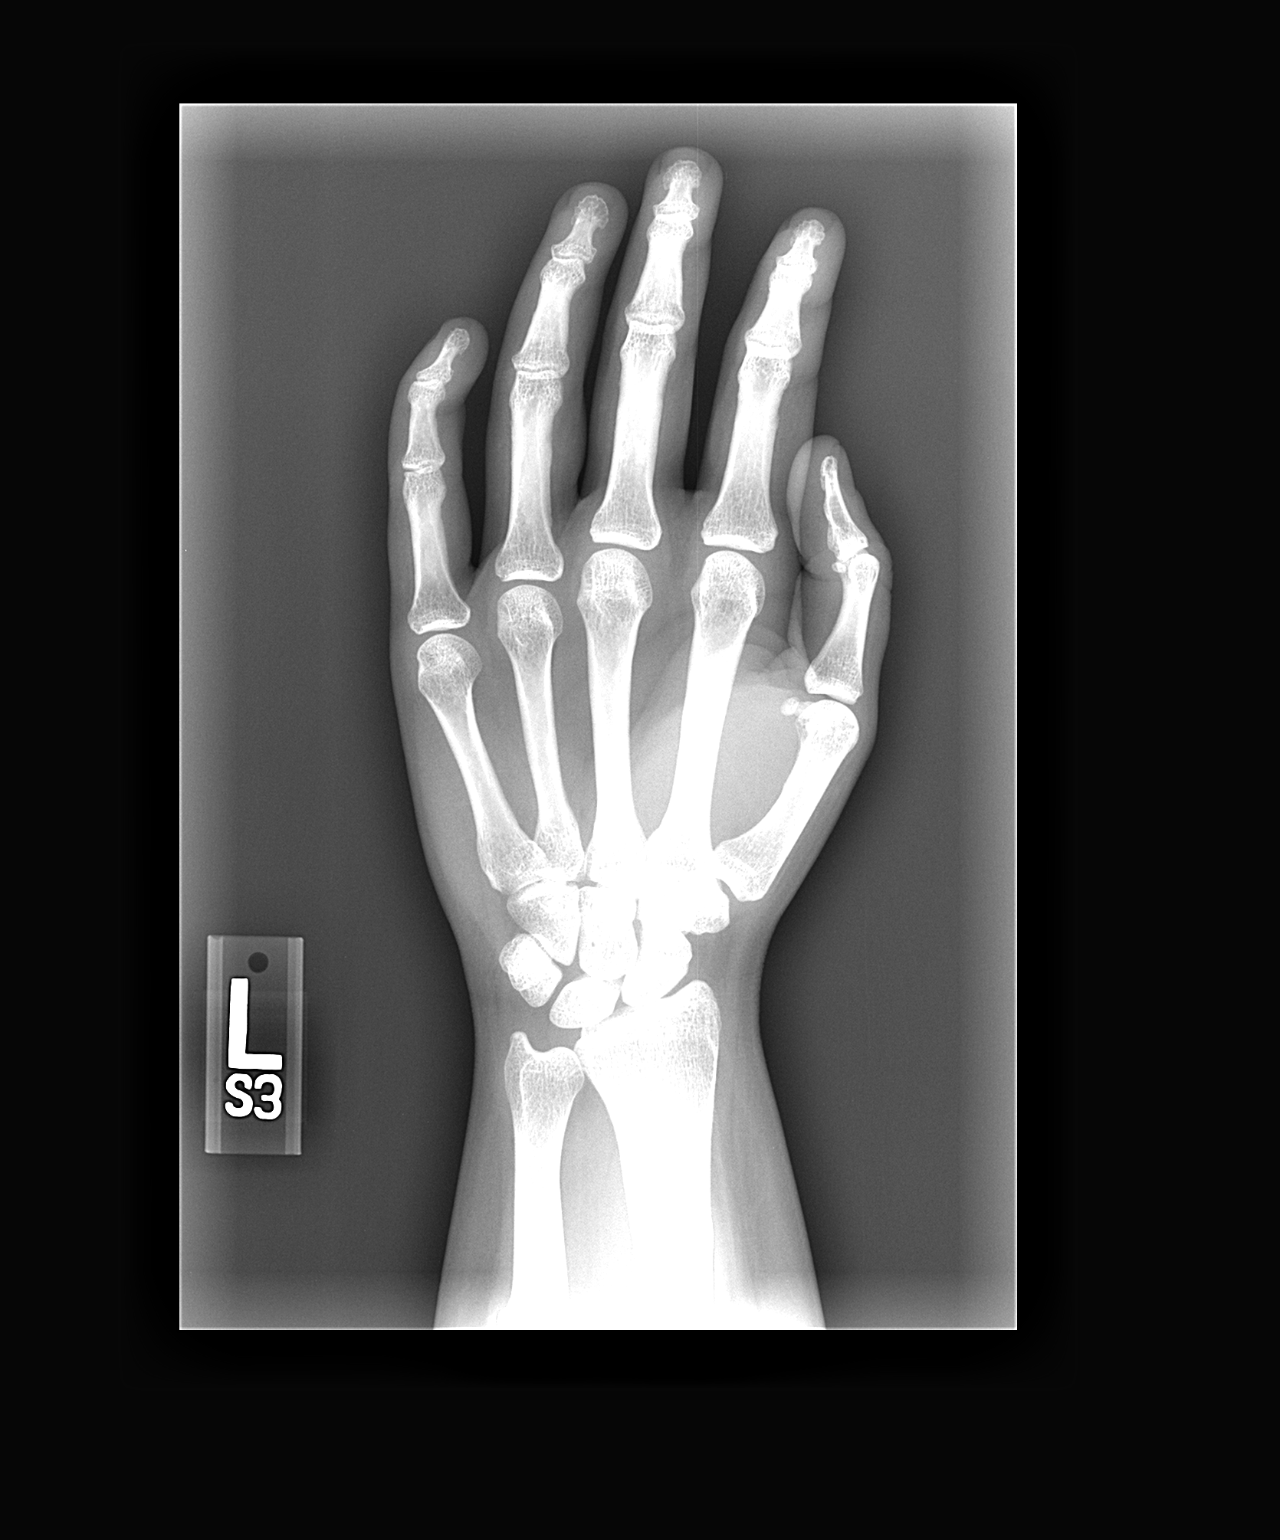

[view not recorded (2 of 3)]
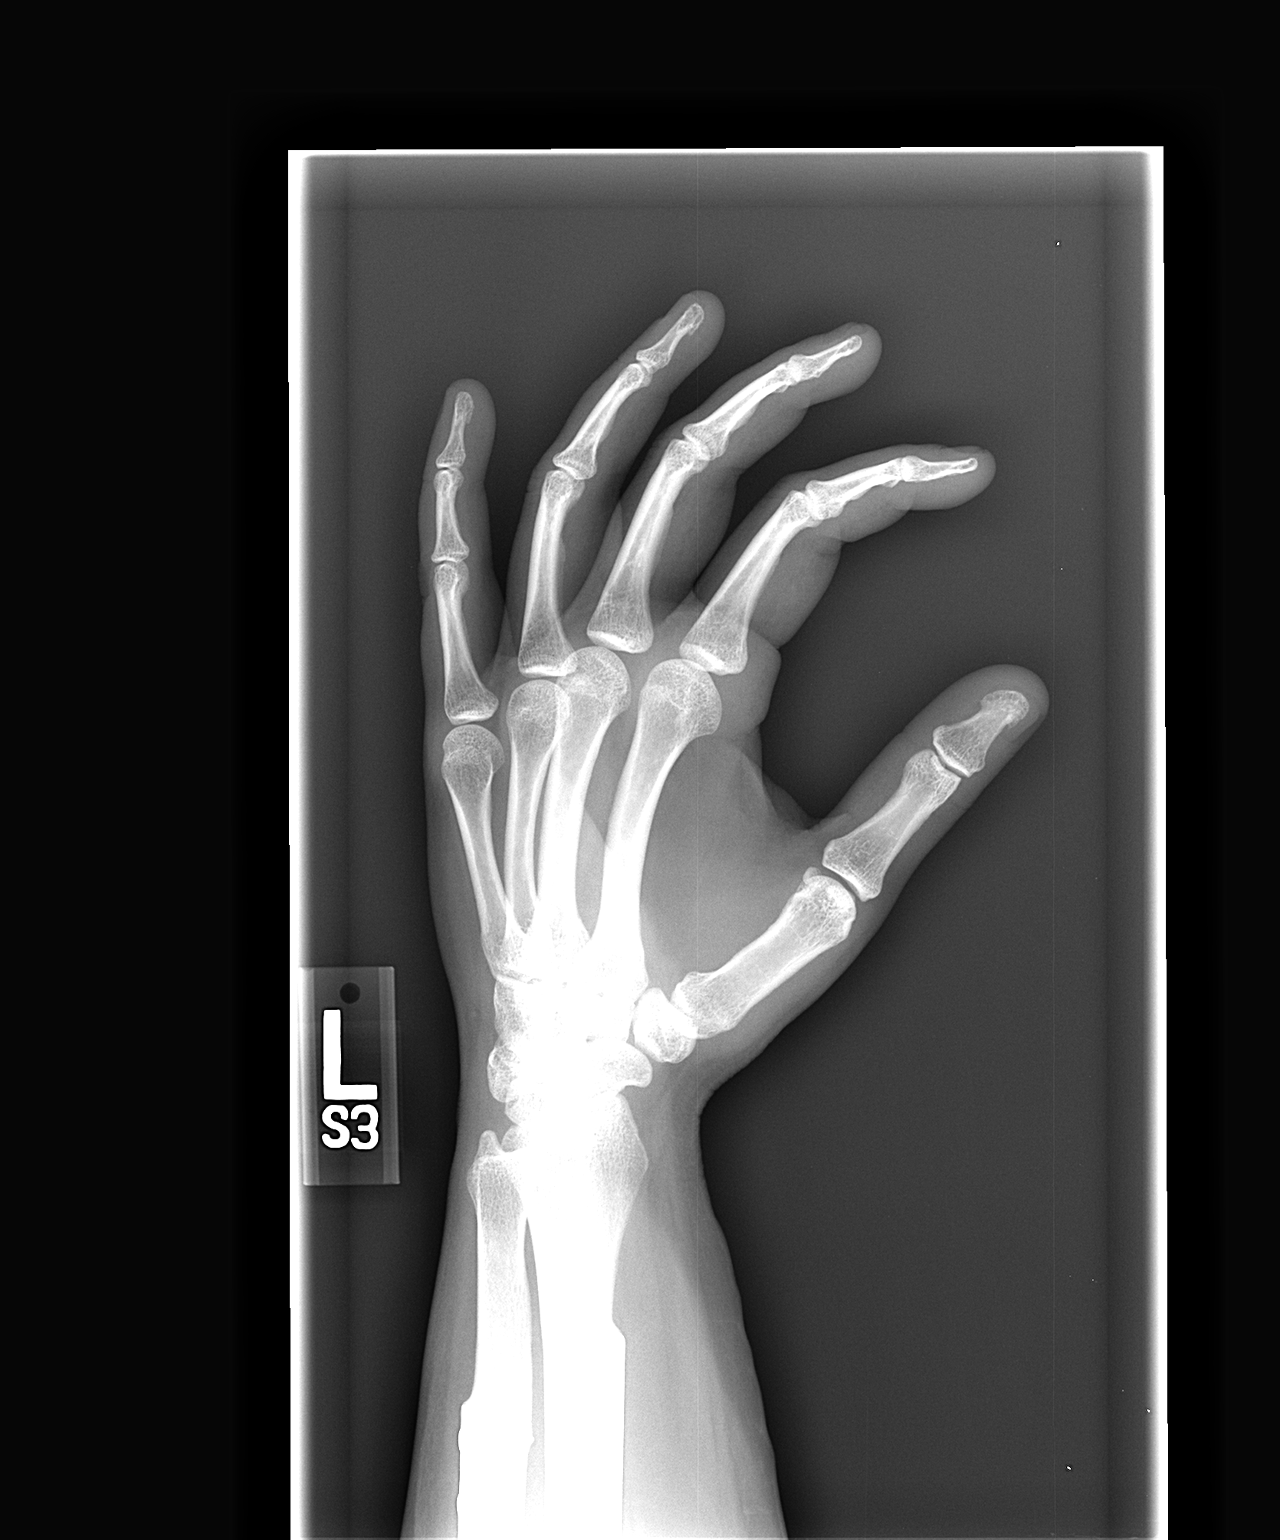

[view not recorded (3 of 3)]
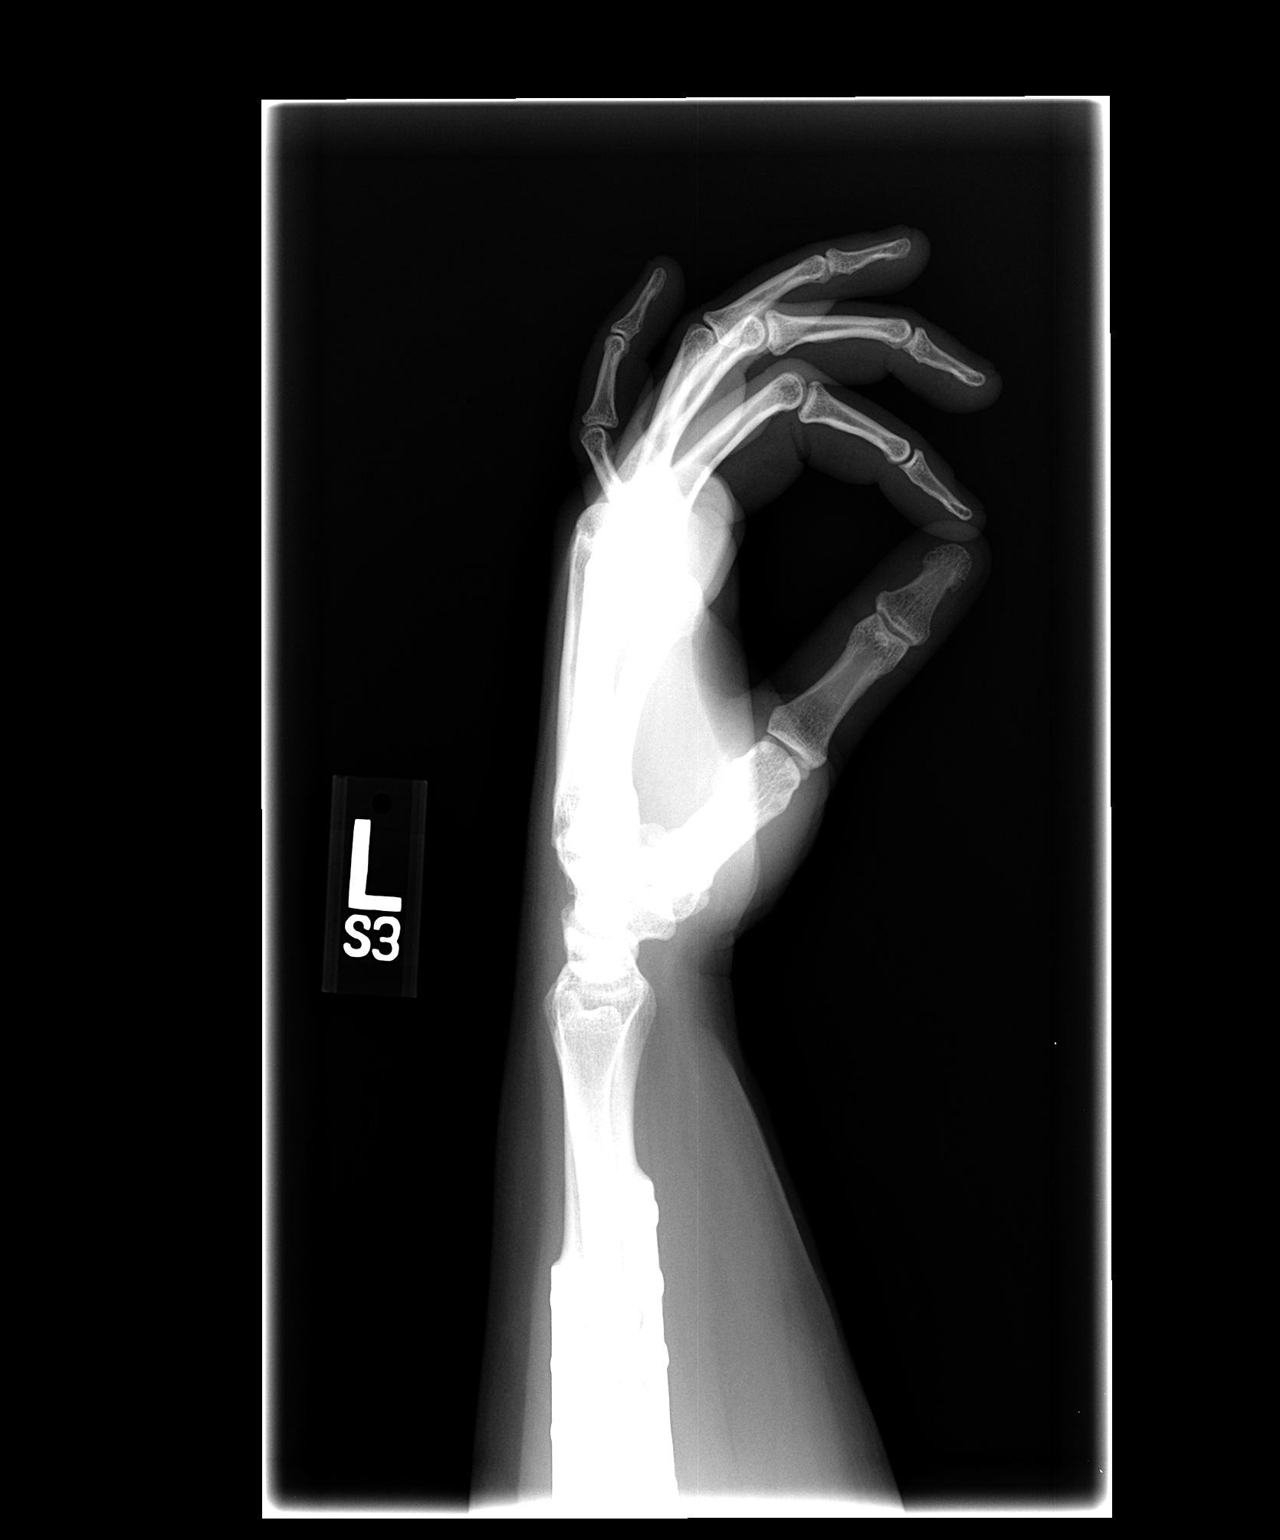

[3 of 3 positions shown; findings below may reference images not displayed]

FINDINGS: No fracture or dislocation.  No foreign body or other
abnormality of the soft tissues.

Prior plating of distal radial and ulnar fractures.
IMPRESSION: No acute findings.

## 2010-03-17 ENCOUNTER — Emergency Department (HOSPITAL_COMMUNITY): Admission: EM | Admit: 2010-03-17 | Discharge: 2010-03-17 | Payer: Self-pay | Admitting: Emergency Medicine

## 2010-03-17 IMAGING — CR DG ABDOMEN ACUTE W/ 1V CHEST
3 series · 3 of 3 positions shown · non-contrast
Comparison: [DATE]

CLINICAL DATA: Abdominal pain, nausea and vomiting

ACUTE ABDOMEN SERIES (ABDOMEN 2 VIEW & CHEST 1 VIEW)

[view not recorded (1 of 3)]
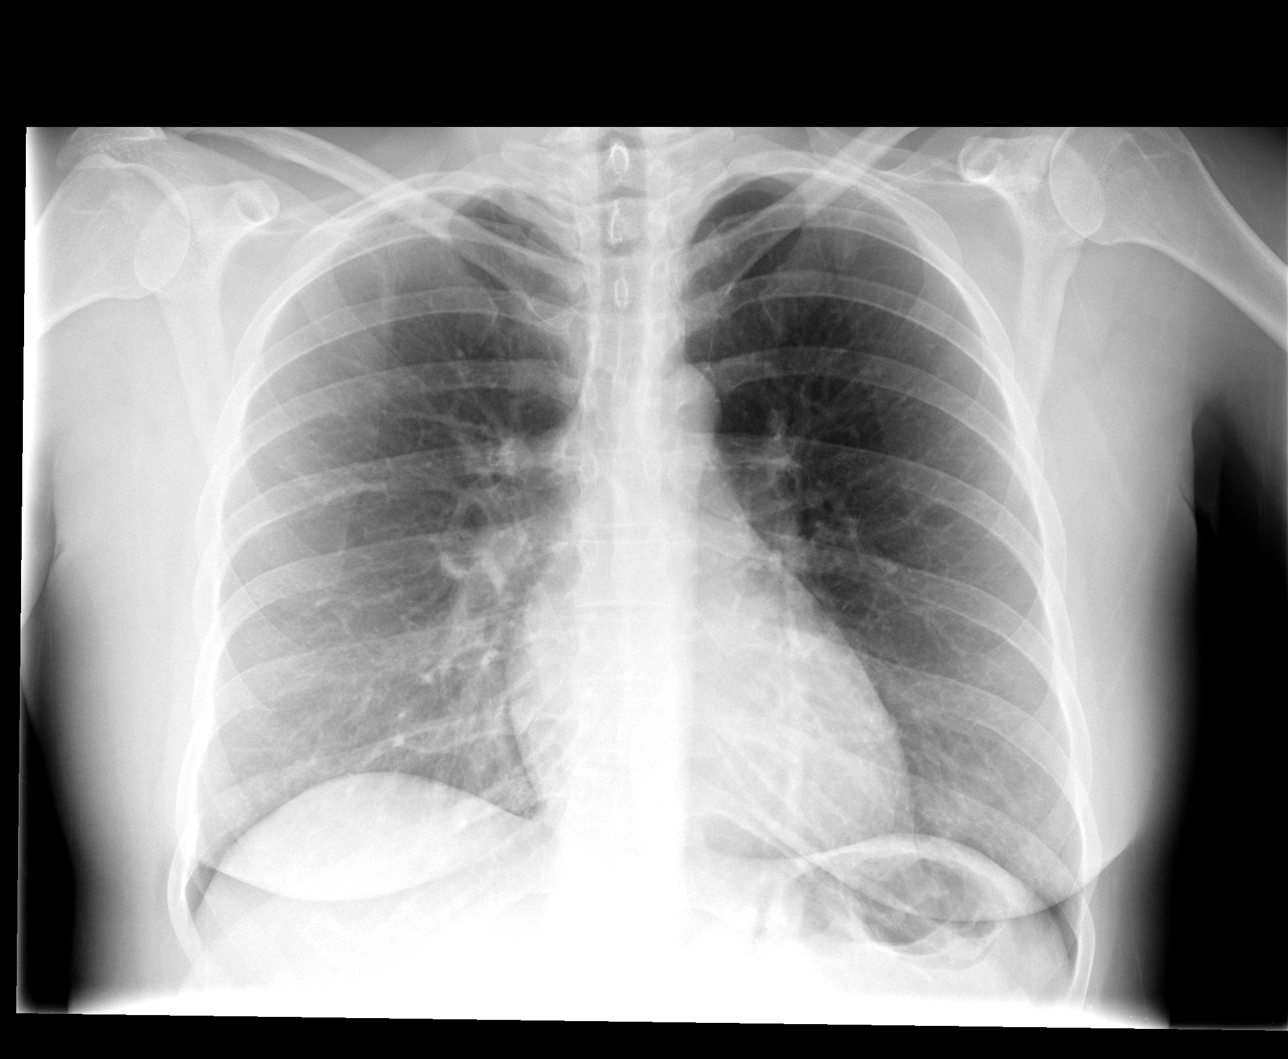

[view not recorded (2 of 3)]
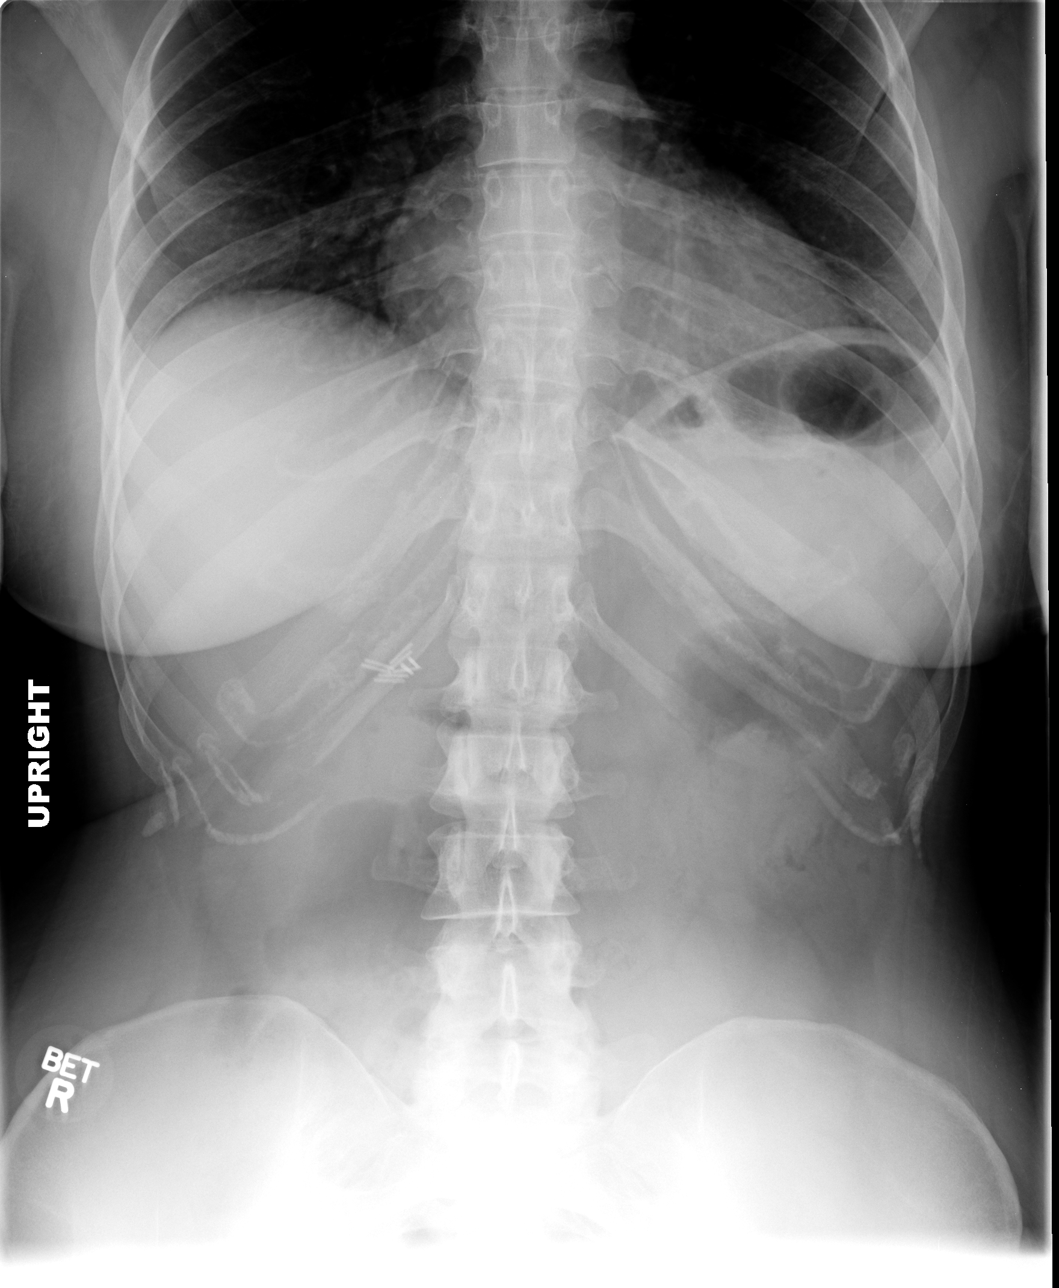

[view not recorded (3 of 3)]
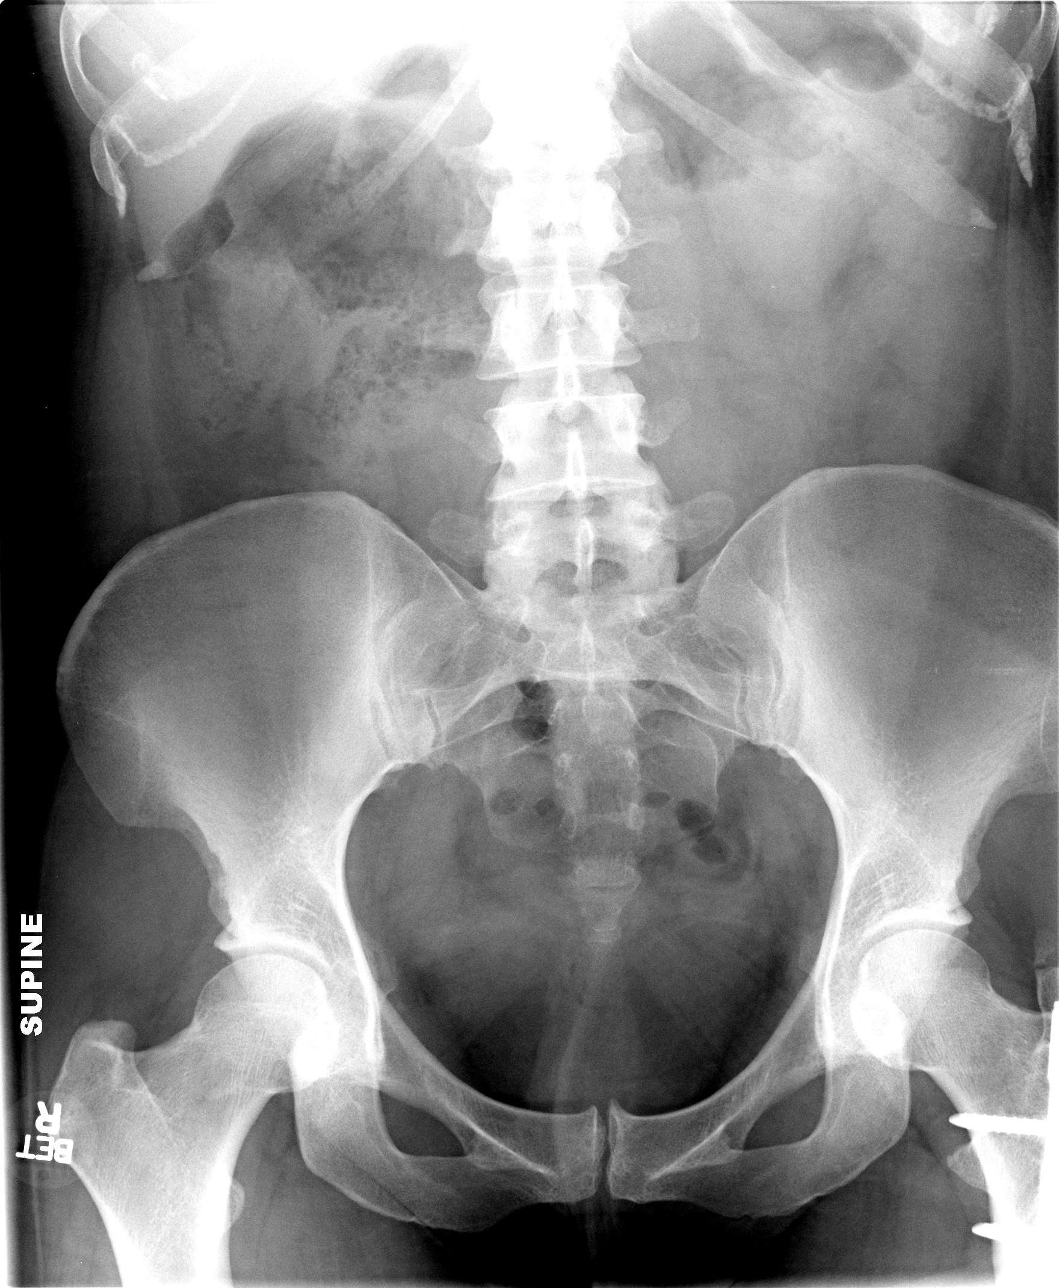

[3 of 3 positions shown; findings below may reference images not displayed]

FINDINGS: Cardiomediastinal silhouette is within normal limits. The
lungs are clear. No pleural effusion.  No pneumothorax.  No acute
osseous abnormality. No free air beneath the diaphragms.

Cholecystectomy clips noted. Normal bowel gas pattern.  No air
fluid level.  Left femoral hardware partly visualized.
IMPRESSION: No evidence for bowel obstruction.  Normal chest.

## 2010-03-18 ENCOUNTER — Inpatient Hospital Stay (HOSPITAL_COMMUNITY): Admission: EM | Admit: 2010-03-18 | Discharge: 2010-03-20 | Payer: Self-pay | Admitting: Emergency Medicine

## 2010-03-18 IMAGING — CT CT ABD-PELV W/ CM
2 of 4 series · 17 of 46 positions shown, 19 images · IV contrast (omniscan)
Comparison: [DATE]

CLINICAL DATA: Epigastric abdominal pain, hematemesis.  Prior
cholecystectomy.

CT ABDOMEN AND PELVIS WITH CONTRAST
TECHNIQUE: Multidetector CT imaging of the abdomen and pelvis was
performed following the standard protocol during bolus
administration of intravenous contrast.
Contrast: 100 ml Omniscan 300 IV contrast

[Series 2: abd_pel_with 5.0 b40f · axial · 0.72mm/px · z∈[-485,-85]mm · 14 of 88 slices shown, 16 images]
[im 4/88  soft-tissue]
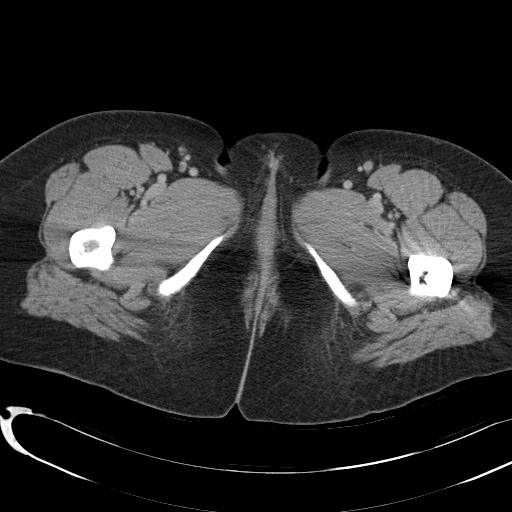
[im 4/88  bone]
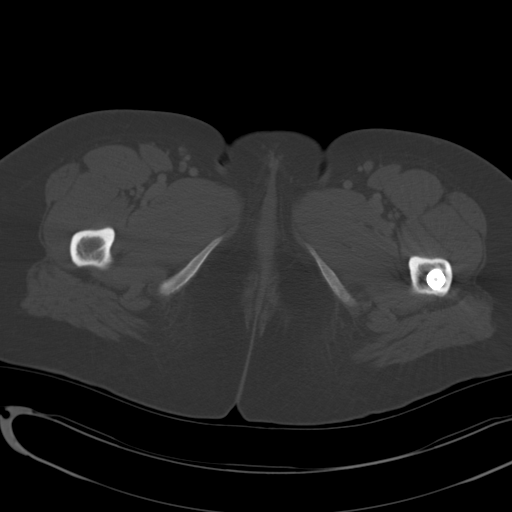
[im 11/88  soft-tissue]
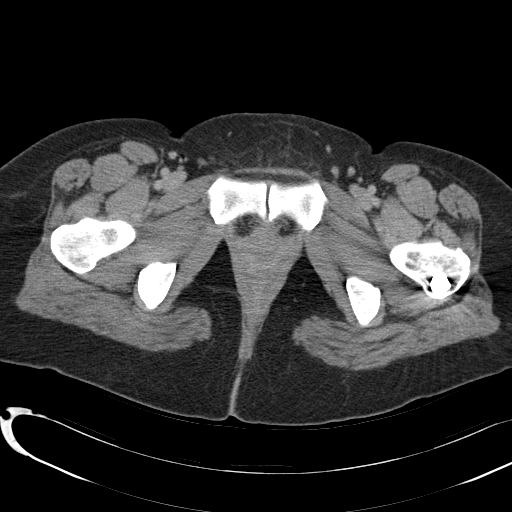
[im 18/88  soft-tissue]
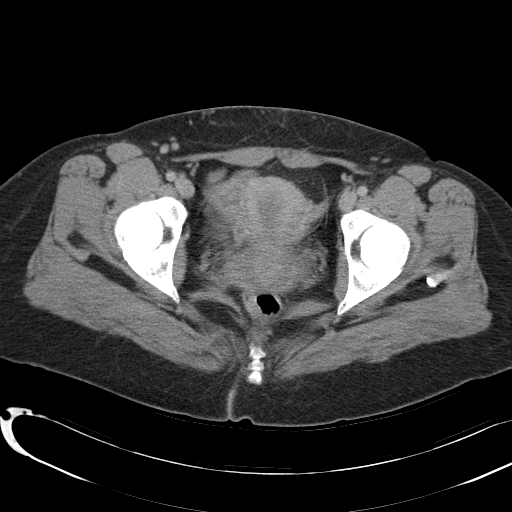
[im 25/88  soft-tissue]
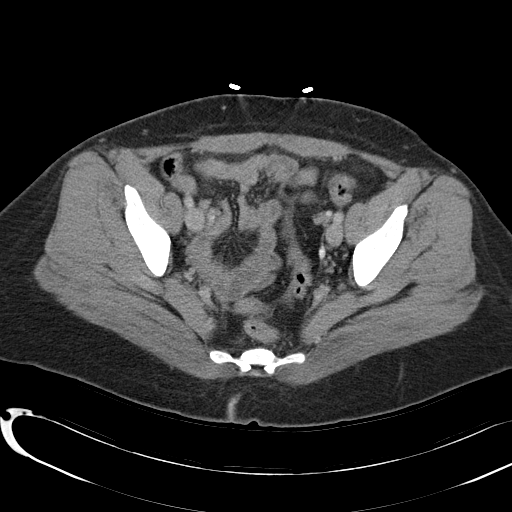
[im 28/88  soft-tissue]
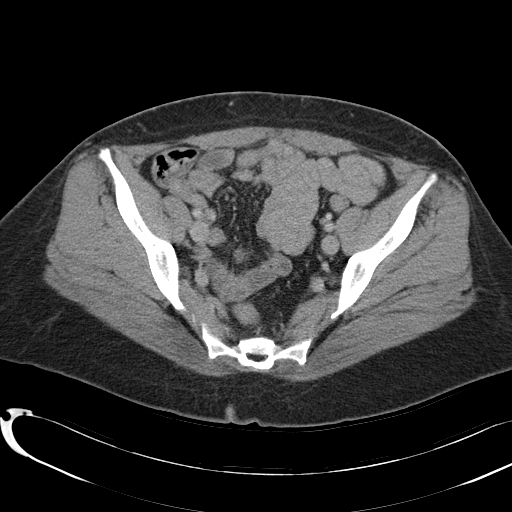
[im 35/88  soft-tissue]
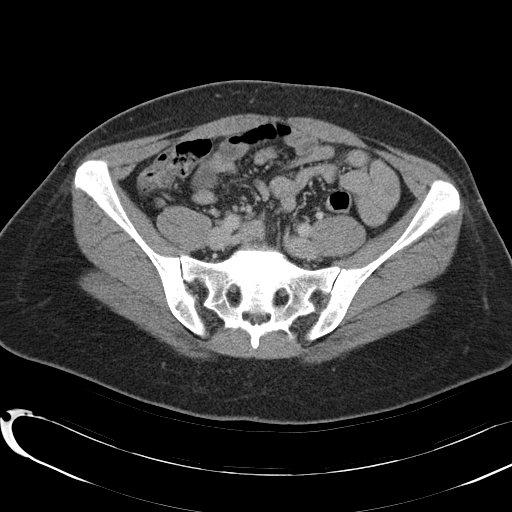
[im 42/88  soft-tissue]
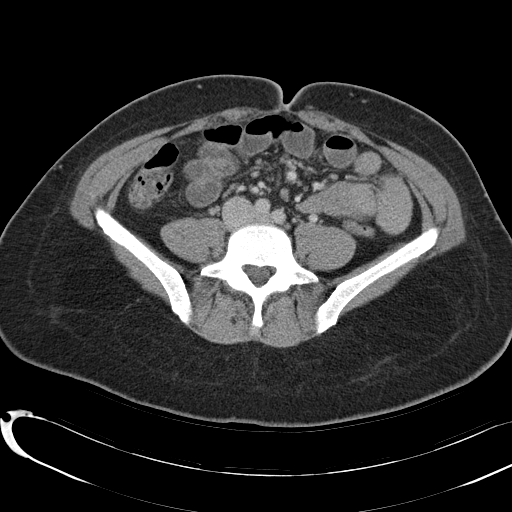
[im 46/88  soft-tissue]
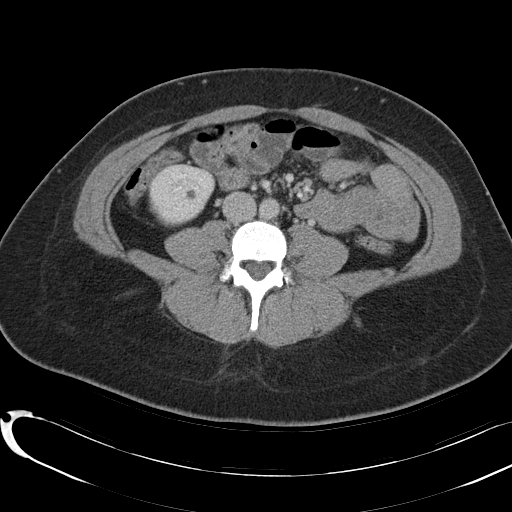
[im 53/88  soft-tissue]
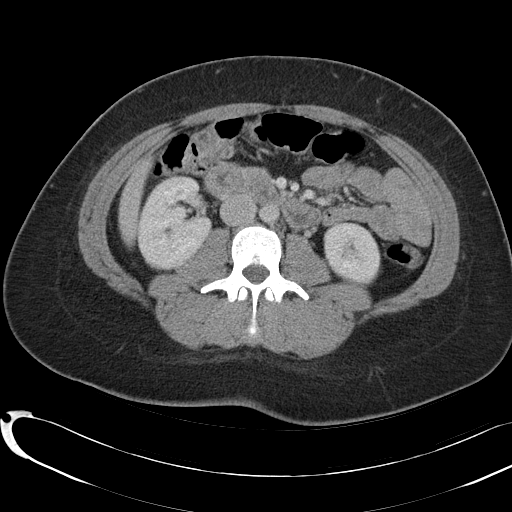
[im 53/88  bone]
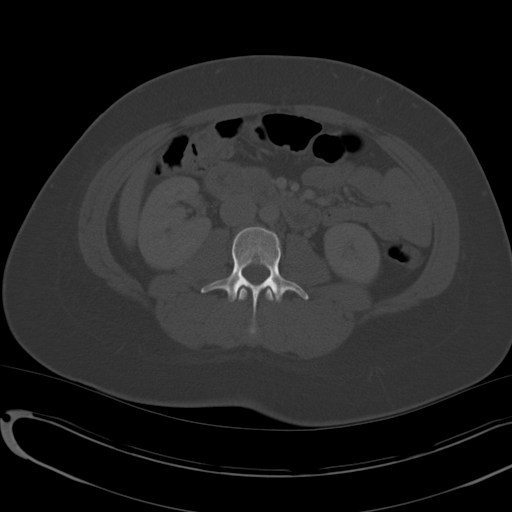
[im 60/88  soft-tissue]
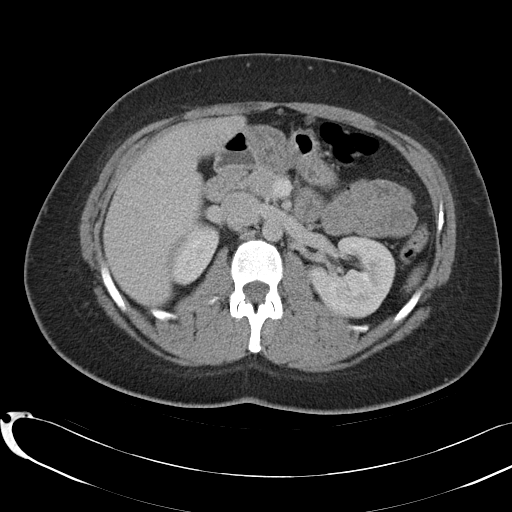
[im 67/88  soft-tissue]
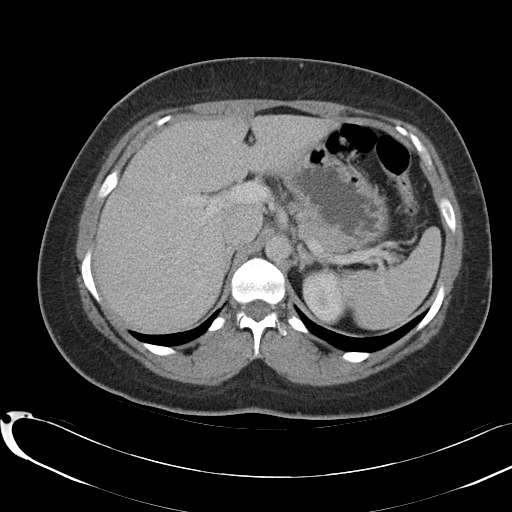
[im 70/88  soft-tissue]
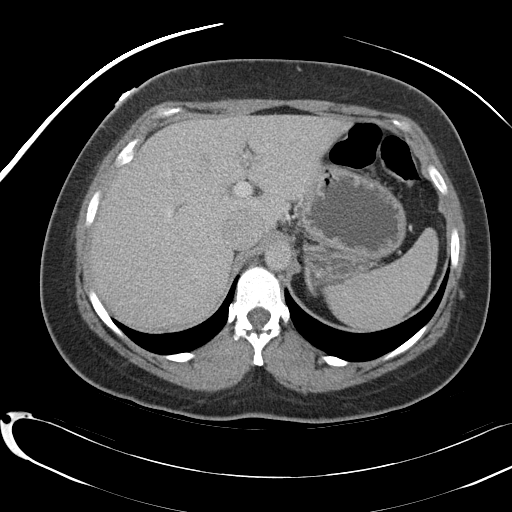
[im 77/88  soft-tissue]
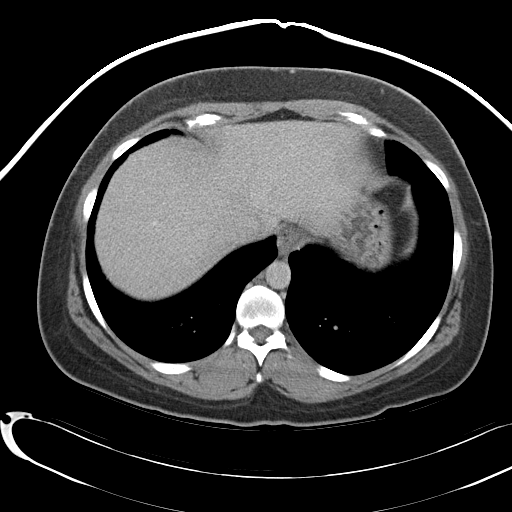
[im 84/88  soft-tissue]
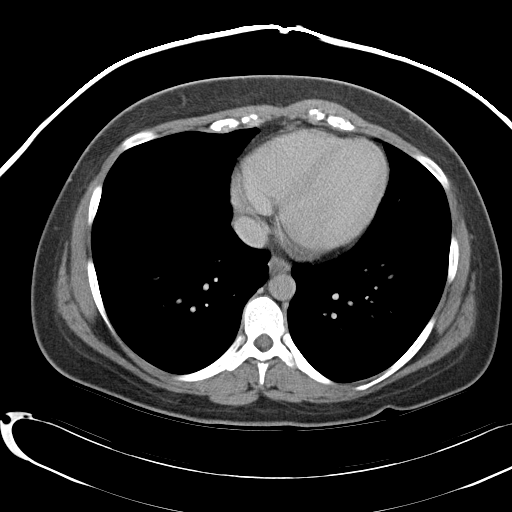

[Series 4: mpr cor post contrast (id) · coronal · 0.69mm/px · 3 of 72 slices shown]
[im 24/72  soft-tissue]
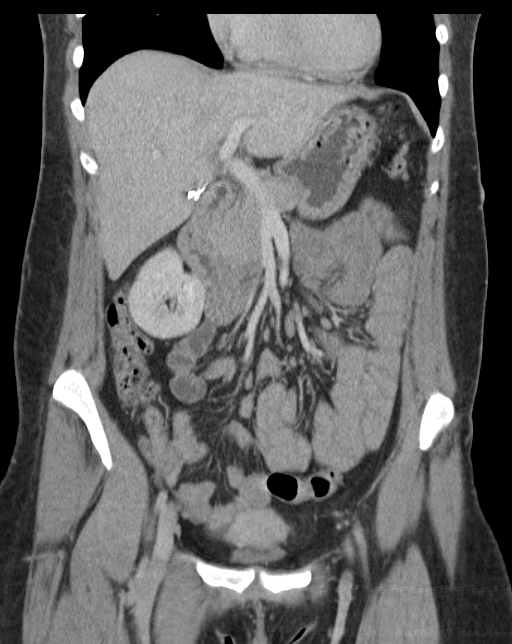
[im 32/72  soft-tissue]
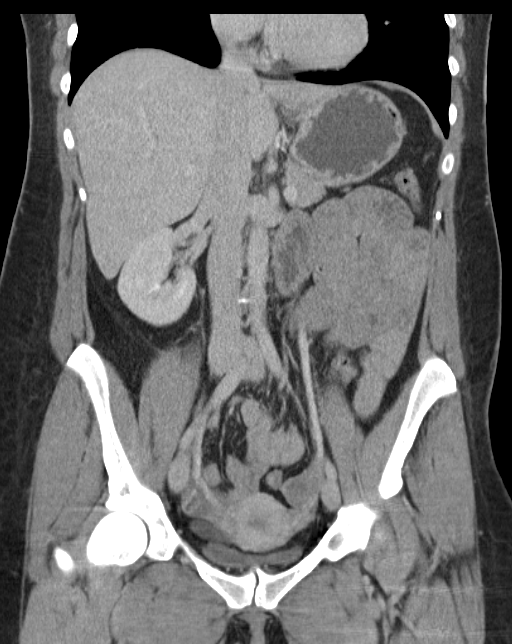
[im 40/72  soft-tissue]
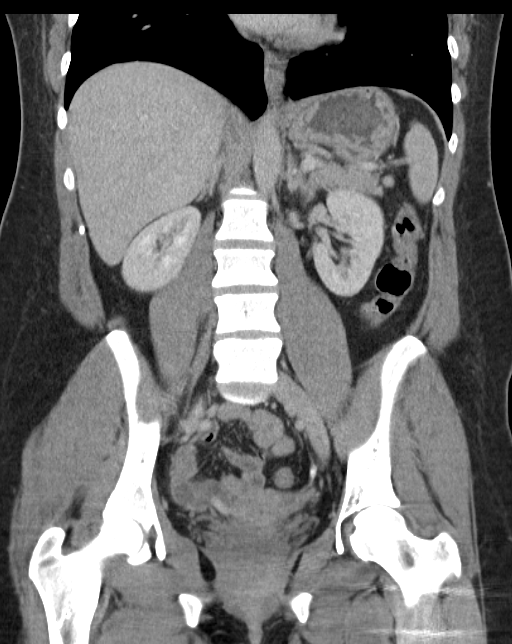

[17 of 46 positions shown; findings below may reference images not displayed]

FINDINGS: Calcified right lower lobe granuloma noted.
Cholecystectomy clips noted. Too small to characterize right lower
renal pole cortical hypodensity again noted.  Left kidney, adrenal
glands, spleen, pancreas, and liver are unremarkable.  No ascites
or lymphadenopathy.

The appendix and unopacified bowel are normal in appearance.
Uterus and ovaries are normal.  Trace pelvic free fluid present.
Left femoral dynamic nail partly visualized, producing streak
artifact.  No acute bony abnormality.
IMPRESSION: No acute intra-abdominal or pelvic pathology.

## 2010-03-18 IMAGING — CR DG CHEST 2V
2 series · 2 of 2 positions shown · non-contrast
Comparison: [DATE].

CLINICAL DATA: 29-year-old female with nausea, vomiting, chest
pain.

CHEST - 2 VIEW

[view not recorded (1 of 2)]
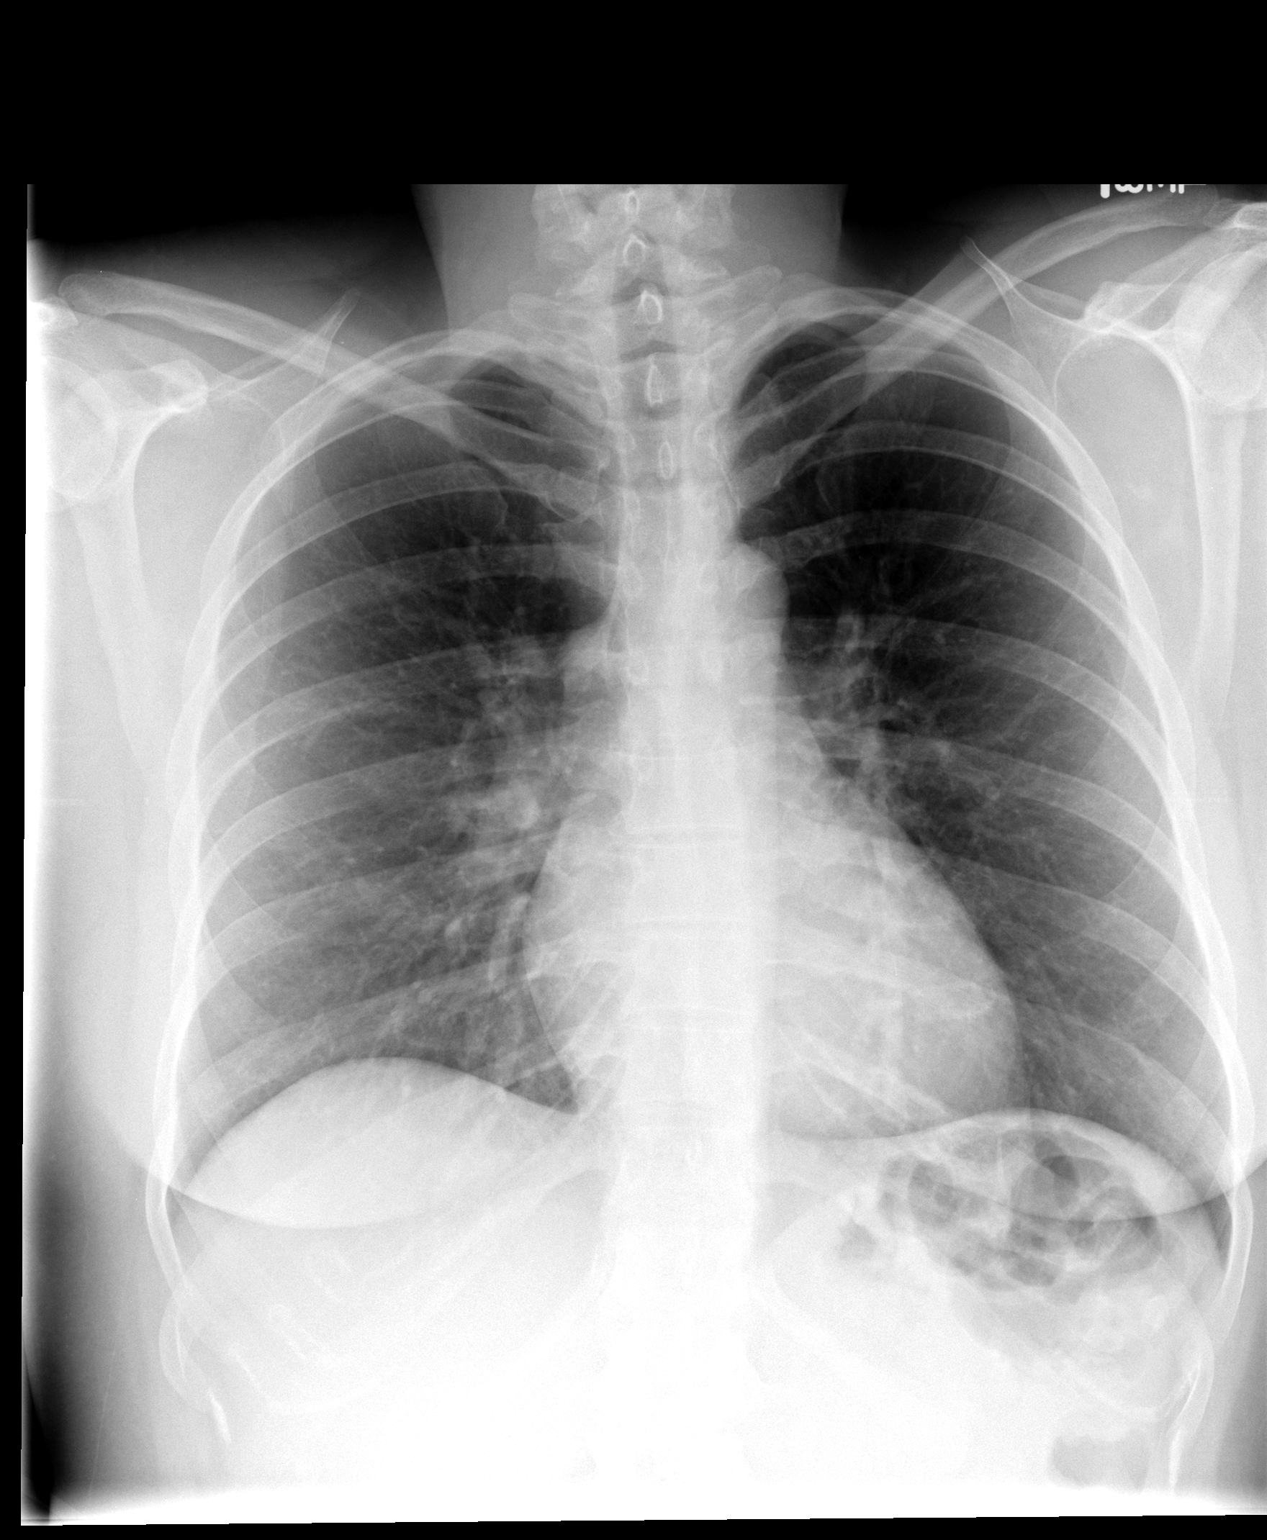

[view not recorded (2 of 2)]
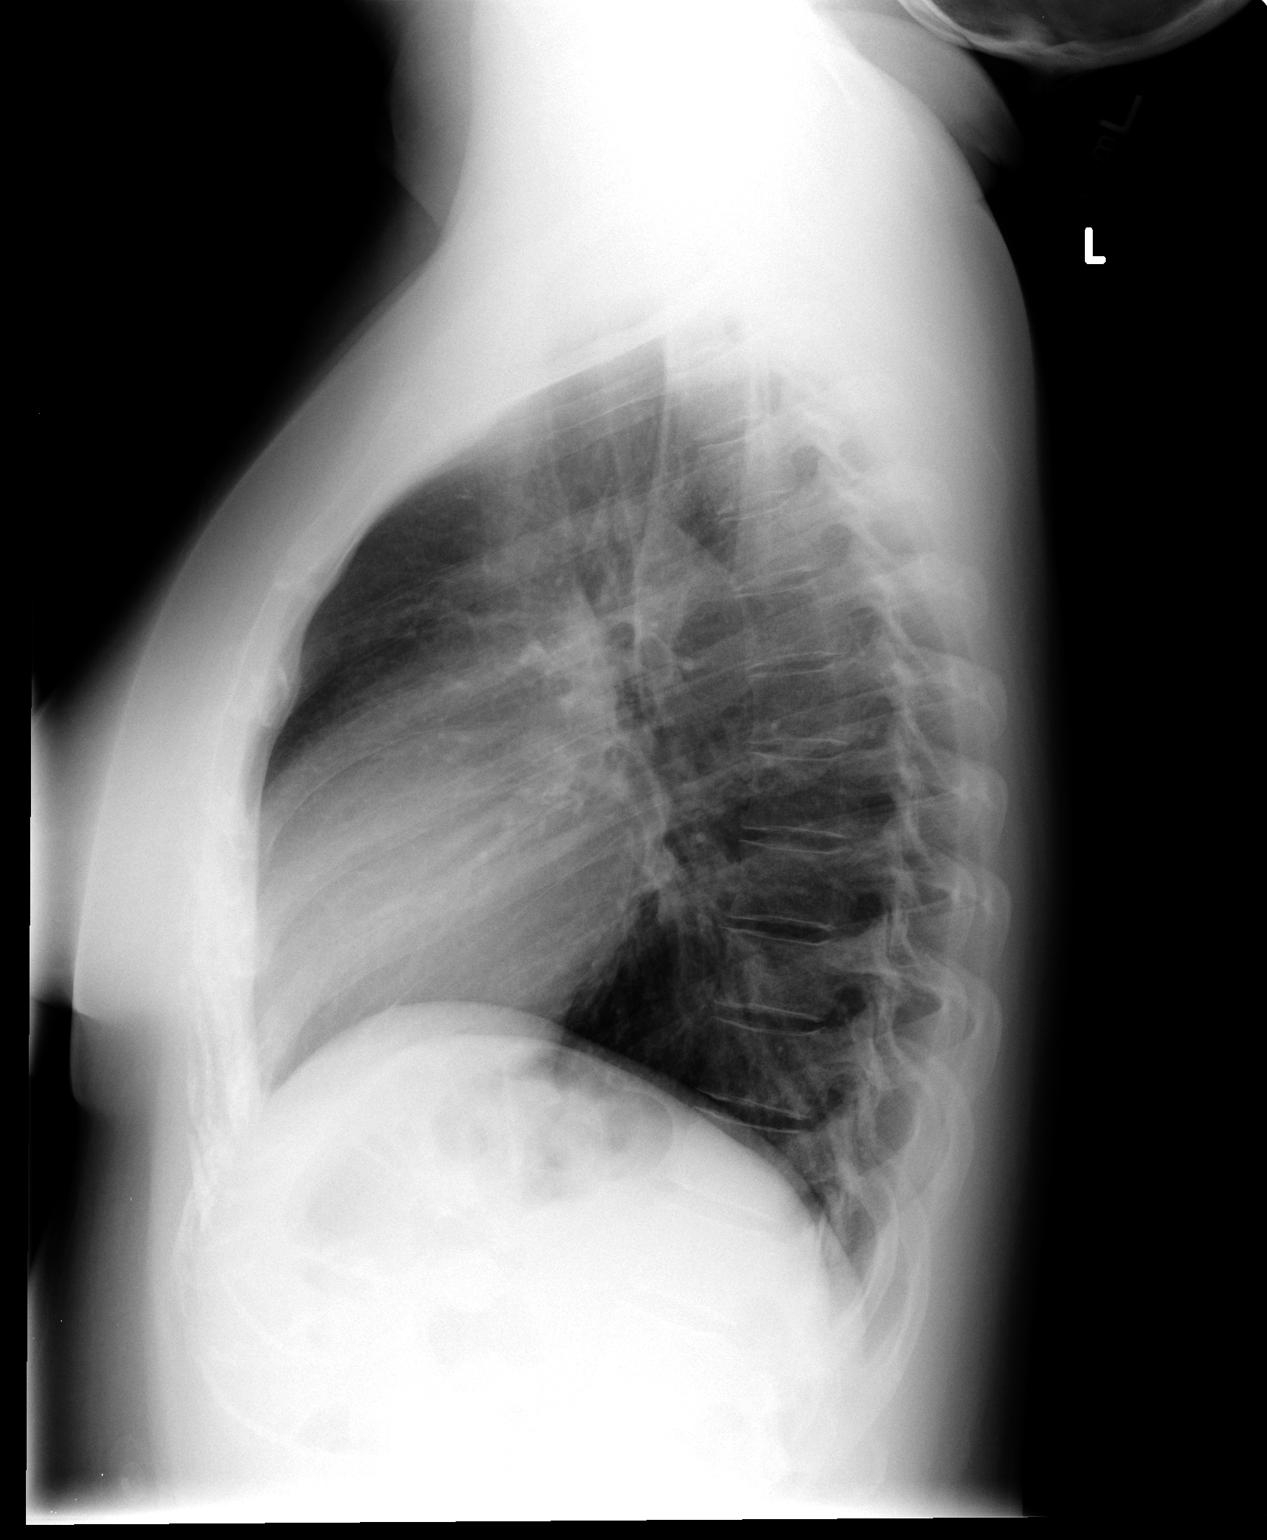

[2 of 2 positions shown; findings below may reference images not displayed]

FINDINGS: Surgical clips in the right upper quadrant.  Stable lung
volumes.  Cardiac size at the upper limits of normal.  Other
mediastinal contours are normal. Visualized tracheal air column is
within normal limits.  No pneumothorax, pulmonary edema, pleural
effusion or confluent airspace opacity. No acute osseous
abnormality identified.
IMPRESSION: No acute cardiopulmonary abnormality.

## 2010-03-21 ENCOUNTER — Emergency Department (HOSPITAL_COMMUNITY): Admission: EM | Admit: 2010-03-21 | Discharge: 2010-03-21 | Payer: Self-pay | Admitting: Emergency Medicine

## 2010-03-23 ENCOUNTER — Emergency Department (HOSPITAL_COMMUNITY): Admission: EM | Admit: 2010-03-23 | Discharge: 2010-03-23 | Payer: Self-pay | Admitting: Emergency Medicine

## 2010-03-28 ENCOUNTER — Emergency Department (HOSPITAL_COMMUNITY): Admission: EM | Admit: 2010-03-28 | Discharge: 2010-03-28 | Payer: Self-pay | Admitting: Emergency Medicine

## 2010-07-05 ENCOUNTER — Emergency Department (HOSPITAL_COMMUNITY): Admission: EM | Admit: 2010-07-05 | Discharge: 2010-07-05 | Payer: Self-pay | Admitting: Emergency Medicine

## 2010-07-05 IMAGING — CT CT ABD-PELV W/ CM
2 of 3 series · 15 of 46 positions shown, 17 images · IV contrast (Omnipaque 300)
Comparison: [DATE]

CLINICAL DATA: Vomiting and abdominal pain

CT ABDOMEN AND PELVIS WITH CONTRAST
TECHNIQUE: Multidetector CT imaging of the abdomen and pelvis was
performed following the standard protocol during bolus
administration of intravenous contrast.
Contrast: 100 ml of [BC]

[Series 2: abd_pel_with 5.0 b40f · axial · 0.63mm/px · z∈[-434,-64]mm · 12 of 86 slices shown, 14 images]
[im 6/86  soft-tissue]
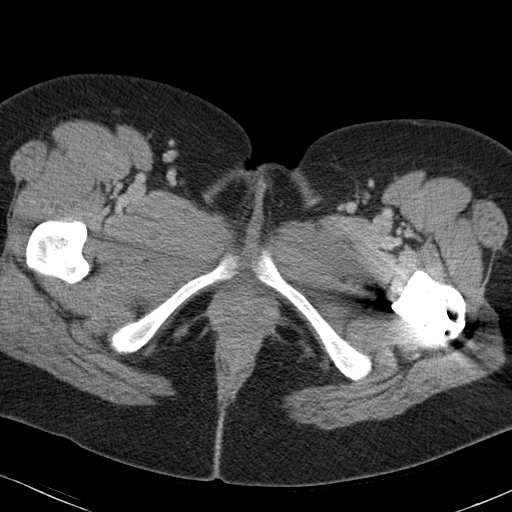
[im 6/86  bone]
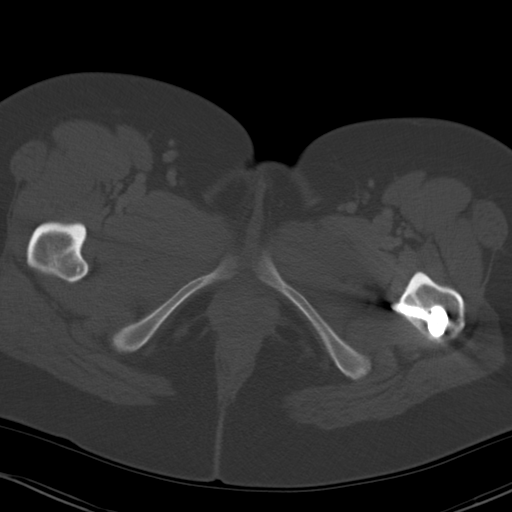
[im 11/86  soft-tissue]
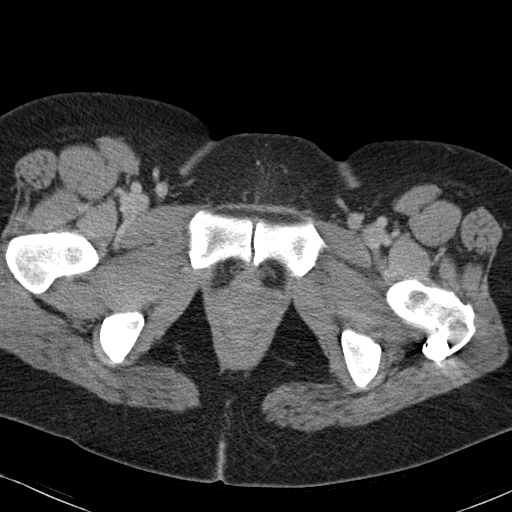
[im 20/86  soft-tissue]
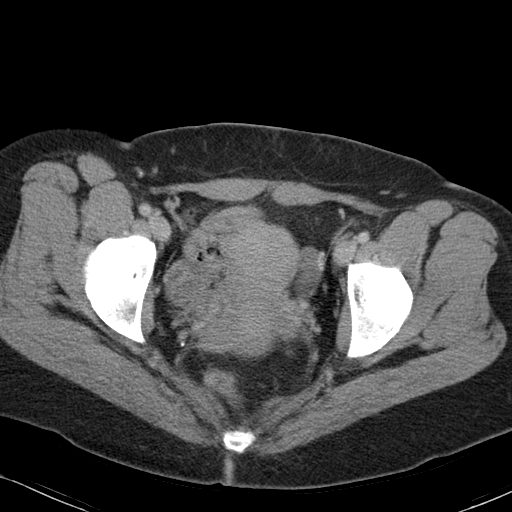
[im 25/86  soft-tissue]
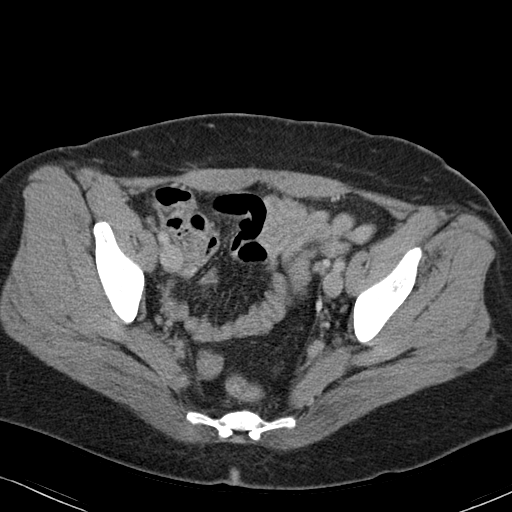
[im 33/86  soft-tissue]
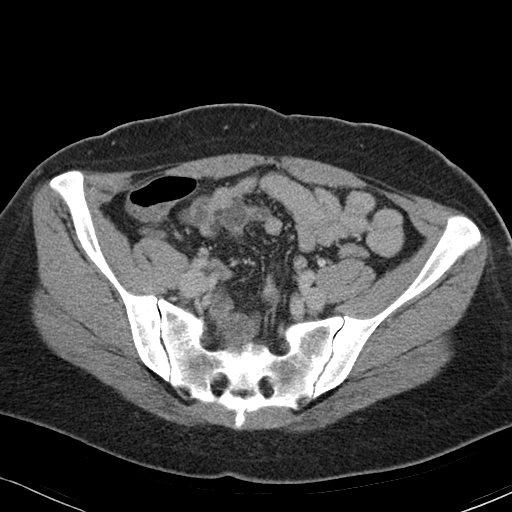
[im 39/86  soft-tissue]
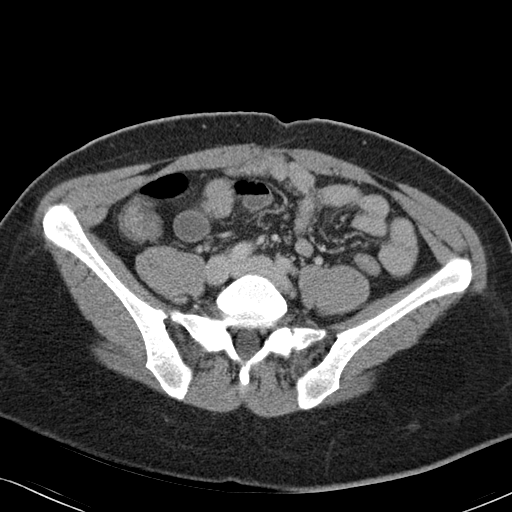
[im 47/86  soft-tissue]
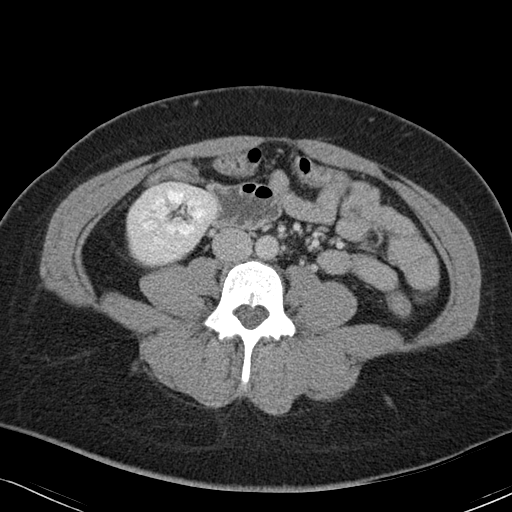
[im 53/86  soft-tissue]
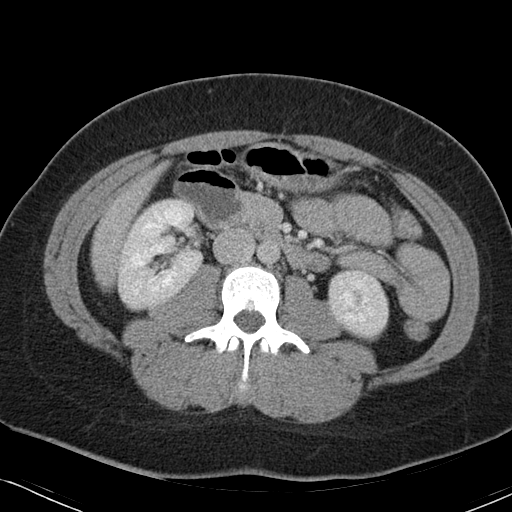
[im 61/86  soft-tissue]
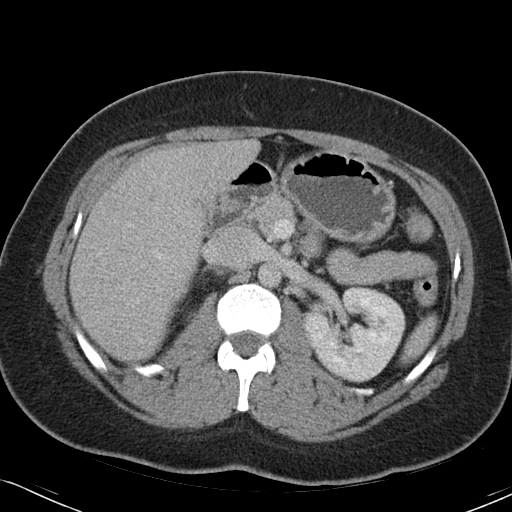
[im 61/86  bone]
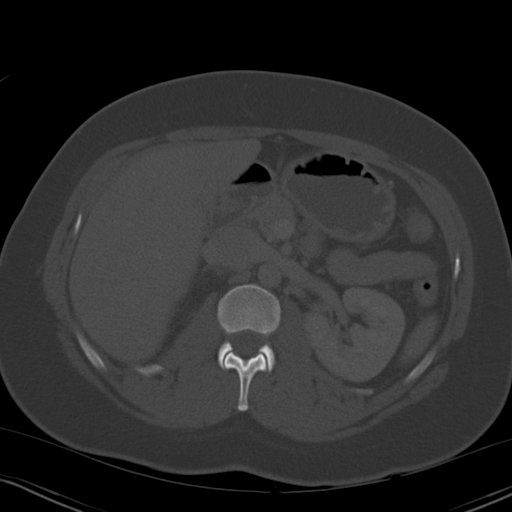
[im 66/86  soft-tissue]
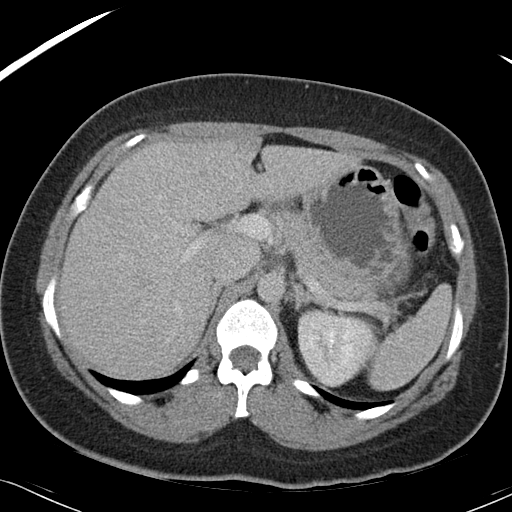
[im 75/86  soft-tissue]
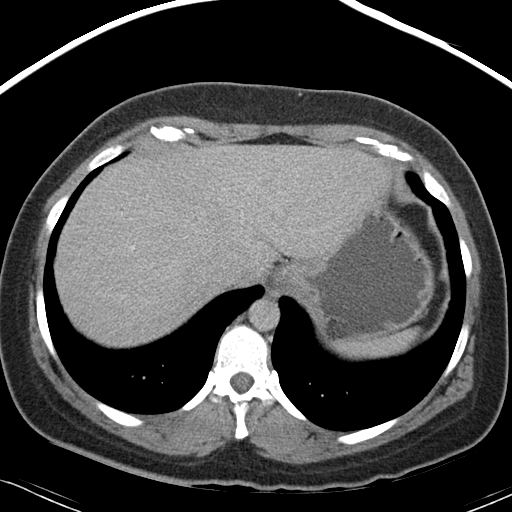
[im 80/86  soft-tissue]
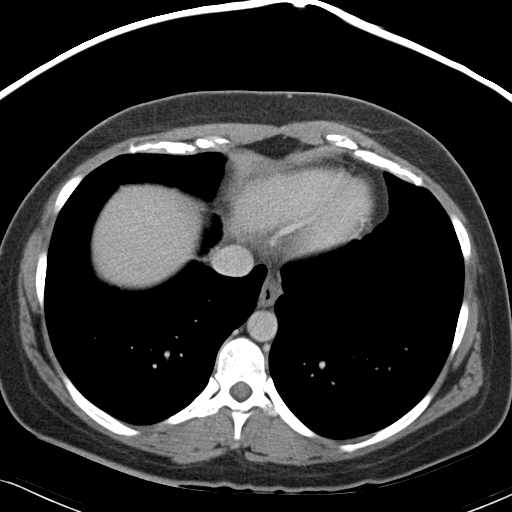

[Series 4: abd_pel_with 3.0 spo cor · coronal · 0.59mm/px · 3 of 90 slices shown]
[im 30/90  soft-tissue]
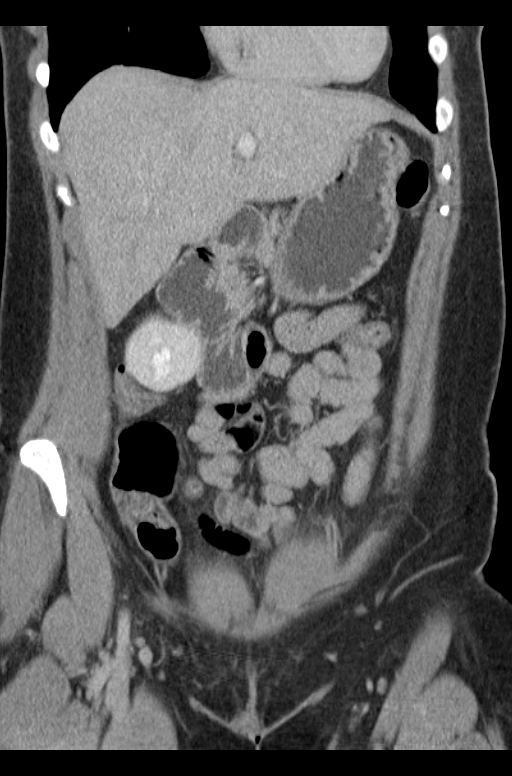
[im 40/90  soft-tissue]
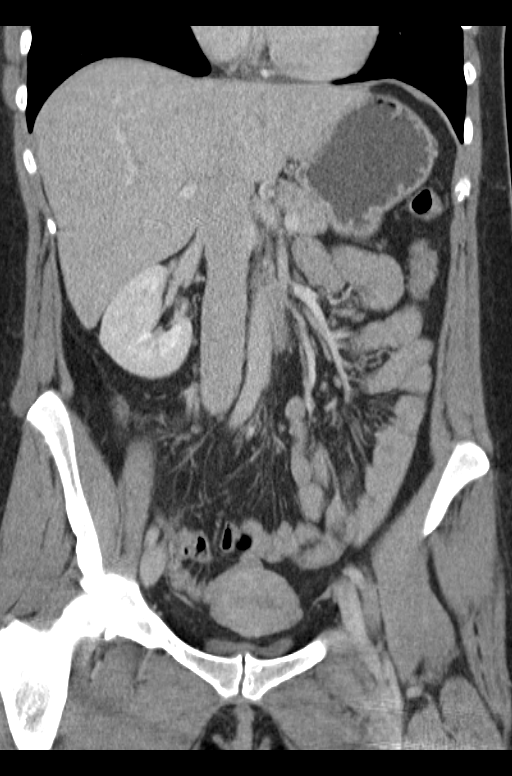
[im 50/90  soft-tissue]
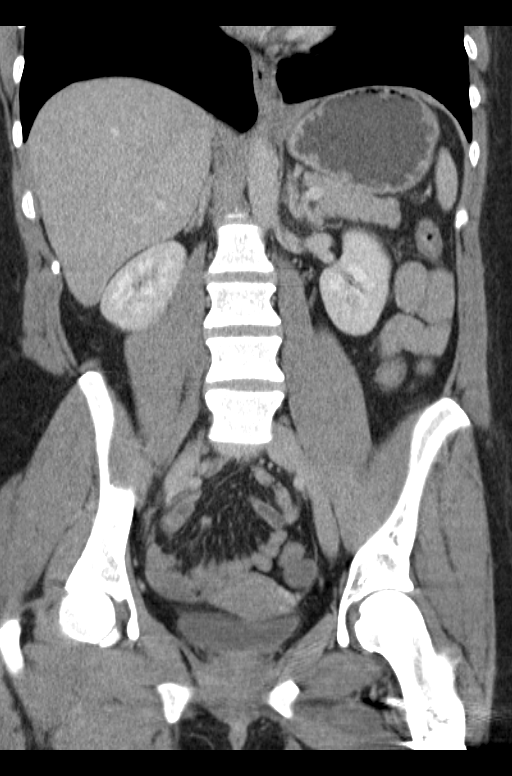

[15 of 46 positions shown; findings below may reference images not displayed]

FINDINGS: The lung bases are clear.  The heart is normal in size.

The patient status post cholecystectomy and there is minimal
prominence of the intrahepatic ducts which is unchanged. The common
bile duct is minimally enlarged measuring 6 mm.  No obstructing
stones or mass is seen. The liver, spleen, adrenal glands and
pancreas are normal.  The kidneys enhance symmetrically.  There is
a low attenuation lesion in the lower pole of the right kidney
measuring 5 mm which is too small to characterize but is unchanged.
There is no hydronephrosis.  There is no small bowel obstruction
seen.  A normal appendix is seen in the right lower quadrant.  The
colon is decompressed but no inflammatory changes are seen.

The uterus and ovaries are within normal limits.  There is no free
fluid seen in the abdomen or pelvis.  Urinary bladder is
decompressed.  The aorta and major branch vessels are patent.  No
significant adenopathy seen in the abdomen or pelvis. There are
increased number of inguinal and femoral lymph nodes although,
these are not enlarged by CT size criteria.

No aggressive lytic or blastic bony lesions are seen.
IMPRESSION: 1.  No acute findings explain the patient's abdominal pain.
2.  Minimal prominence of the intrahepatic bile ducts in this
patient status post cholecystectomy.  Correlation with biliary
enzymes is recommended to exclude underlying obstruction.
3.  Unchanged low attenuation lesion in the lower pole right kidney
which remains too small to be characterized.

## 2010-07-06 ENCOUNTER — Emergency Department (HOSPITAL_COMMUNITY): Admission: EM | Admit: 2010-07-06 | Discharge: 2010-07-06 | Payer: Self-pay | Admitting: Emergency Medicine

## 2010-07-09 ENCOUNTER — Emergency Department (HOSPITAL_COMMUNITY): Admission: EM | Admit: 2010-07-09 | Discharge: 2010-07-09 | Payer: Self-pay | Admitting: Emergency Medicine

## 2010-07-22 ENCOUNTER — Emergency Department (HOSPITAL_COMMUNITY)
Admission: EM | Admit: 2010-07-22 | Discharge: 2010-07-22 | Payer: Self-pay | Source: Home / Self Care | Admitting: Emergency Medicine

## 2010-11-16 ENCOUNTER — Encounter: Payer: Self-pay | Admitting: Internal Medicine

## 2010-11-17 ENCOUNTER — Encounter: Payer: Self-pay | Admitting: Orthopedic Surgery

## 2010-11-18 ENCOUNTER — Ambulatory Visit
Admission: RE | Admit: 2010-11-18 | Discharge: 2010-11-18 | Payer: Self-pay | Source: Home / Self Care | Attending: Orthopedic Surgery | Admitting: Orthopedic Surgery

## 2010-11-18 ENCOUNTER — Encounter: Payer: Self-pay | Admitting: Orthopedic Surgery

## 2010-11-18 DIAGNOSIS — D492 Neoplasm of unspecified behavior of bone, soft tissue, and skin: Secondary | ICD-10-CM | POA: Insufficient documentation

## 2010-11-19 ENCOUNTER — Encounter (INDEPENDENT_AMBULATORY_CARE_PROVIDER_SITE_OTHER): Payer: Self-pay | Admitting: *Deleted

## 2010-11-20 ENCOUNTER — Encounter: Payer: Self-pay | Admitting: Orthopedic Surgery

## 2010-11-20 LAB — BASIC METABOLIC PANEL
GFR calc Af Amer: >60 mL/min (ref >60)
GFR calc non Af Amer: >60 mL/min (ref >60)
Glucose, Bld: 86 mg/dL (ref 70–99)
Potassium: 4.1 mEq/L (ref 3.5–5.1)
Sodium: 137 mEq/L (ref 135–145)

## 2010-11-20 LAB — SURGICAL PCR SCREEN
MRSA, PCR: NEGATIVE
Staphylococcus aureus: NEGATIVE

## 2010-11-20 LAB — HEMOGLOBIN AND HEMATOCRIT, BLOOD: Hemoglobin: 14.2 g/dL (ref 12.0–15.0)

## 2010-11-21 ENCOUNTER — Ambulatory Visit (HOSPITAL_COMMUNITY)
Admission: RE | Admit: 2010-11-21 | Discharge: 2010-11-21 | Payer: Self-pay | Source: Home / Self Care | Attending: Orthopedic Surgery | Admitting: Orthopedic Surgery

## 2010-11-21 ENCOUNTER — Telehealth (INDEPENDENT_AMBULATORY_CARE_PROVIDER_SITE_OTHER): Payer: Self-pay | Admitting: *Deleted

## 2010-11-24 ENCOUNTER — Ambulatory Visit
Admission: RE | Admit: 2010-11-24 | Discharge: 2010-11-24 | Payer: Self-pay | Source: Home / Self Care | Attending: Orthopedic Surgery | Admitting: Orthopedic Surgery

## 2010-11-24 NOTE — Op Note (Addendum)
  [name], [name]                 ACCOUNT NO.:  [id number]  MEDICAL RECORD NO.:  [id number]          PATIENT TYPE:  AMB  LOCATION:  DAY                           FACILITY:  [facility]  PHYSICIAN:  Vickki Hearing, M.D.DATE OF BIRTH:  [birth year]  DATE OF PROCEDURE:  11/21/2010 DATE OF DISCHARGE:  11/21/2010                              OPERATIVE REPORT   A 30 year old female who presented with an enlarging mass on her right thumb on the ulnar aspect just off the metacarpophalangeal joint. Initial radiographs were negative.  No lymphadenopathy.  The patient complained of pain and loss of some of the flexion of her thumb.  PREOPERATIVE DIAGNOSIS:  Mass, right thumb.  POSTOPERATIVE DIAGNOSIS:  Ganglion, right thumb.  PROCEDURE:  Excisional biopsy, right thumb.  SURGEON:  Vickki Hearing, MD  ASSISTANTS:  None.  ANESTHESIA:  Bier block.  OPERATIVE FINDINGS:  A 5 x 7 mm sac filled with clear fluid coming from the metacarpophalangeal joint of the right thumb.  SPECIMEN:  Ganglion cyst.  BLOOD LOSS:  Minimal.  TOURNIQUET TIME:  Recorded in anesthesia record.  Tourniquet pressure 250 mmHg.  Time-out procedure was completed.  Preop site marking and chart update was also completed.  DETAILS OF PROCEDURE:  The patient was taken to the operating room for Bier block.  Right arm was prepped and draped in sterile technique. Time-out procedure was then started and executed.  An incision longitudinally was made over the mass.  Subcutaneous tissue was divided.  Blunt sharp dissection was carried out to the mass could be excised intact.  The mass was opened up and clear fluid came through indicating most likely ganglion cyst.  The mass seemed to be coming from the metacarpophalangeal joint.  Deep fascial and muscle tissue was bluntly dissected around the mass which gave a stalk which appeared to be coming again from the metacarpophalangeal joint.  Once the mass was removed,  wound was irrigated and closed with 2-0 Monocryl and 3-0 nylon interrupted sutures.  We injected 10 mL of Marcaine 0.5% Sensorcaine plain and then applied a sterile dressing.  Tourniquet was released.  The patient was taken to recovery room in stable condition.  Followup scheduled for Monday.     Vickki Hearing, M.D.     SEH/MEDQ  D:  11/21/2010  T:  11/22/2010  Job:  [id number]  Electronically Signed by Fuller Canada M.D. on 11/24/2010 04:43:24 PM

## 2010-11-26 ENCOUNTER — Encounter: Payer: Self-pay | Admitting: Orthopedic Surgery

## 2010-11-27 NOTE — Progress Notes (Signed)
Summary: Prior approval not required per note 11/19/10  Phone Note Outgoing Call   Call placed to: Insurer Summary of Call: Per clinical updale note 11/19/10 - reached Medicaid's pre-auth @ ph [phone]; prior approval not required for out-pt procedure codes [phone], dx 239.2, scheduled 11/21/10 at [facility]. Initial call taken by: Cammie Sickle,  November 21, 2010 10:44 AM

## 2010-11-27 NOTE — Assessment & Plan Note (Signed)
Summary: ? GANG CYST RT THUMB NEEDS XR/CA MCD/FUSCO/BSF

## 2010-11-27 NOTE — Letter (Signed)
Summary: surgery order RT thumb scheduled 11/21/10  surgery order RT thumb scheduled 11/21/10   Imported By: Cammie Sickle 11/18/2010 19:55:08  _____________________________________________________________________  External Attachment:    Type:   Image     Comment:   External Document

## 2010-11-27 NOTE — Letter (Signed)
Summary: History form  History form   Imported By: Jacklynn Ganong 11/20/2010 08:31:02  _____________________________________________________________________  External Attachment:    Type:   Image     Comment:   External Document

## 2010-11-27 NOTE — Miscellaneous (Signed)
  WT 186    HT  5 FEET AND 2 INCHES.  30 YR OLD WOMAN PT REFERRAL DR. Sherwood Gambler  RIGHT THUMB HAS KNOT, c/o MASS MILD NON SPECIFIC PAIN, NO LOSS OF MOTION   NO INJURY  XRAYS TODAY  ROS CHILLS, FATIQUE, HEARTBURN, NAUSEA VOMITING, DIARRHES, JOINT PAIN, STIFFNESS, MUSCLE PAIN, ANXIETY DEPRESSION  MEDICAL PROBLEMS: REFLUX, ANXIETY.  HAS HAD 22 SURGERIES?  NKDA  MEDS: LORCET 10/650, XANAX 2MG , OMEPRAZOLE.  PHYSICAL EXAM   GEN: well developed, well nourished, normal grooming and hygiene, no deformity and normal body habitus.   CDV: pulses are normal, no edema, no erythema. no tenderness  Lymph: normal lymph nodes   Skin: no rashes, skin lesions or open sores   NEURO: normal coordination, reflexes, sensation.   Psyche: awake, alert and oriented. Mood normal   In the interspace of the thumb and index finger. There is a small mass just proximal to the IP joint of the metacarpal phalangeal area of the thumb. It is very firm somewhat fixed and does not appear to be involving the bone. We do note that there is decreased flexion and opposition of the RIGHT thumb.  Color and perfusion to the digits seem to be normal.  The x-rays were obtained. Separately identifiable. X-rays, RIGHT hand. No bone involvement or soft tissue masses seen on the film.  Impression normal RIGHT hand film.  Impression Mass, RIGHT thumb.  Plan excisional biopsy of the RIGHT thumb.  Risk benefits explained to the patient primary risk of infection, and recurrence depending on path. Identification        Signed by Fuller Canada MD on 11/18/2010 at 4:13 PM  ________________________________________________________________________

## 2010-11-27 NOTE — Miscellaneous (Signed)
Summary: [place] for pre-authorization out-pt procedure  Clinical Lists Changes  Contacted insurer Medicaid's prior authorization system [employer]. Spoke w/Linda - transferred.  Lost connection. Try back to [phone]. CPT [phone], dx 239.2

## 2010-11-27 NOTE — Miscellaneous (Signed)
Summary: NP/ RIGHT THUMB PAIN/NEEDS XRAYS MEDICAID/[place]  WT 186    HT  5 FEET AND 2 INCHES.  30 YR OLD WOMAN PT REFERRAL DR. Sherwood Gambler  RIGHT THUMB HAS KNOT  NO INJURY  XRAYS TODAY  ROS CHILLS, FATIQUE, HEARTBURN, NAUSEA VOMITING, DIARRHES, JOINT PAIN, STIFFNESS, MUSCLE PAIN, ANXIETY DEPRESSION  MEDICAL PROBLEMS: REFLUX, ANXIETY.  HAS HAD 22 SURGERIES?  NKDA  MEDS: LORCET 10/650, XANAX 2MG , OMEPRAZOLE.  PHYSICAL EXAM   GEN: well developed, well nourished, normal grooming and hygiene, no deformity and normal body habitus.   CDV: pulses are normal, no edema, no erythema. no tenderness  Lymph: normal lymph nodes   Skin: no rashes, skin lesions or open sores   NEURO: normal coordination, reflexes, sensation.   Psyche: awake, alert and oriented. Mood normal  In the interspace of the thumb and index finger. There is a small mass just proximal to the IP joint of the metacarpal phalangeal area of the thumb. It is very firm somewhat fixed and does not appear to be involving the bone. We do note that there is decreased flexion and opposition of the RIGHT thumb.  Color and perfusion to the digits seem to be normal.  The x-rays were obtained. Separately identifiable. X-rays, RIGHT hand. No bone involvement or soft tissue masses seen on the film.  Impression normal RIGHT hand film.  Impression Mass, RIGHT thumb.  Plan excisional biopsy of the RIGHT thumb.  Risk benefits explained to the patient primary risk of infection, and recurrence depending on path. Identification

## 2010-11-28 ENCOUNTER — Encounter: Payer: Self-pay | Admitting: Orthopedic Surgery

## 2010-12-01 ENCOUNTER — Encounter: Payer: Self-pay | Admitting: Orthopedic Surgery

## 2010-12-01 ENCOUNTER — Ambulatory Visit (INDEPENDENT_AMBULATORY_CARE_PROVIDER_SITE_OTHER): Payer: Medicaid Other | Admitting: Orthopedic Surgery

## 2010-12-01 DIAGNOSIS — D492 Neoplasm of unspecified behavior of bone, soft tissue, and skin: Secondary | ICD-10-CM

## 2010-12-03 ENCOUNTER — Ambulatory Visit: Payer: Medicaid Other | Admitting: Orthopedic Surgery

## 2010-12-03 ENCOUNTER — Telehealth: Payer: Self-pay | Admitting: Orthopedic Surgery

## 2010-12-03 NOTE — Miscellaneous (Signed)
  Clinical Lists Changes  Orders: Added new Service order of New Patient Level III [phone]) - Signed

## 2010-12-03 NOTE — Assessment & Plan Note (Signed)
Summary: POST OP 1/RT THUMB/SURG 11/21/10/[place]ICA[place]   Visit Type:  Follow-up  CC:  post op 1 thumb.  History of Present Illness: I saw [name] in the office today for a followup visit.  She is a 30 years old woman with the complaint of:  Post op 1 excisional biopsy of mass right thumb.  DOS 11/21/10.  POD 3   For recheck today.  Lorcet 10/650 for pain, given previously from PCP.  Pain level is 5 now.  Our path report is not back, but the clinical appearance, was that of a ganglion.  Recommend remove sutures next Monday.      Impression & Recommendations:  Problem # 1:  NEOPLASMS UNSPEC NATURE BONE SOFT TISSUE&SKIN (ICD-239.2) Assessment Comment Only  more likely to be a ganglion cyst  Orders: Post-Op Check (60109)  Patient Instructions: 1)  RETURN FEB 6 FOR SUTURE REMOVAL    Orders Added: 1)  Post-Op Check [32355]

## 2010-12-03 NOTE — Miscellaneous (Signed)
    Called the patient to tell her that her biopsy was a benign lesion  I got a voicemail so i did not give her the exact diagnosis secondary to HIPPA  Clinical Lists Changes

## 2010-12-09 ENCOUNTER — Encounter: Payer: Self-pay | Admitting: Orthopedic Surgery

## 2010-12-09 ENCOUNTER — Ambulatory Visit (INDEPENDENT_AMBULATORY_CARE_PROVIDER_SITE_OTHER): Payer: Medicaid Other | Admitting: Orthopedic Surgery

## 2010-12-09 DIAGNOSIS — L02519 Cutaneous abscess of unspecified hand: Secondary | ICD-10-CM

## 2010-12-09 DIAGNOSIS — D492 Neoplasm of unspecified behavior of bone, soft tissue, and skin: Secondary | ICD-10-CM

## 2010-12-09 DIAGNOSIS — L03119 Cellulitis of unspecified part of limb: Secondary | ICD-10-CM | POA: Insufficient documentation

## 2010-12-11 NOTE — Assessment & Plan Note (Signed)
Summary: POST OP 2/SUT REM ON [facility] 12/01/10 PER DR/SURG 11/21/10/[place]...   Visit Type:  Follow-up  CC:  post op 2 finger.  History of Present Illness: I saw [name] in the office today for a followup visit.  She is a 30 years old woman with the complaint of:  Post op 2 excisional biopsy of mass right thumb.  DOS 11/21/10.  POD 10  For recheck today and sutures out.  Lorcet 10/650 for pain, given previously from PCP.  Doing well.  Pain level is 1 today.    I don't like the way her wound looks.  There is a serous drainage with slight separation of the skin edges.  So I will like to come back in a week use Neosporin keep it covered.         Impression & Recommendations:  Problem # 1:  NEOPLASMS UNSPEC NATURE BONE SOFT TISSUE&SKIN (ICD-239.2) Assessment Comment Only  Orders: Post-Op Check (95621)  Patient Instructions: 1)  wound check 1 week   Orders Added: 1)  Post-Op Check [30865]

## 2010-12-11 NOTE — Progress Notes (Signed)
Summary: PATIENT CANCELLED TODAY'S APP'T  Phone Note Call from Patient   Summary of Call: [name] ([birth year]) called our office  on 12/02/10 at about 4:45 asking for an appointment for her incision to be checked.  She said it was red and swollen.  I scheduled her for  12/03/10 at 11:45.  On Wednesday morning at 9:00 she called our office saying she had forgotten that she had another appointment scheduled for that time and she wanted to cancel the appointment here  today because her sister had cleaned and bandaged  the incision and it was much better today.  She said she is already scheduled here for 12/09/10 and  would come in at that time.  She did not want an earlier appointment. Initial call taken by: Jacklynn Ganong,  December 03, 2010 9:37 AM  Follow-up for Phone Call        chasity call her in some keflex 500 mg two times a day x 7 days  Follow-up by: Fuller Canada MD,  December 03, 2010 11:39 AM    New/Updated Medications: KEFLEX 500 MG CAPS (CEPHALEXIN) 1 by mouth two times a day for 7 days Prescriptions: KEFLEX 500 MG CAPS (CEPHALEXIN) 1 by mouth two times a day for 7 days  #14 x 0   Entered by:   Ether Griffins   Authorized by:   Fuller Canada MD   Signed by:   Ether Griffins on 12/03/2010   Method used:   Faxed to ...       [facility]* (retail)       [address]       [place], [place]  [address]       Ph: [phone]       Fax: [phone]   RxID:   [id number]   Appended Document: PATIENT CANCELLED TODAY'S APP'T called and lmom for pt

## 2010-12-16 ENCOUNTER — Encounter: Payer: Self-pay | Admitting: Orthopedic Surgery

## 2010-12-16 ENCOUNTER — Ambulatory Visit (INDEPENDENT_AMBULATORY_CARE_PROVIDER_SITE_OTHER): Payer: Medicaid Other | Admitting: Orthopedic Surgery

## 2010-12-16 DIAGNOSIS — M654 Radial styloid tenosynovitis [de Quervain]: Secondary | ICD-10-CM

## 2010-12-17 NOTE — Assessment & Plan Note (Signed)
Summary: POST OP 2/WOUND CK/[place] M[place],PCP [facility]CGOUGH/CAF   Visit Type:  Follow-up   History of Present Illness: I saw [name] in the office today for a followup visit.  She is a 30 years old woman with the complaint of:    excisional biopsy of mass, RIGHT thumb.  Path report revealed ganglion.  Date of surgery was January 27. POD # 17   MEDS:  Lorcet 10/650 for pain. Also takes ibuprofen. She is up to 2 tablets at a time.  She called about some wound drainage, and we called her in some antibiotics, but she went on vacation. Didn't get them. ( SHE CANCELLED HER APPT ALSO)   Recommend starting antibiotics take ibuprofen with Lorcet 10. She does have narcotic tolerance, so, she'll have some difficulty for couple of days, but the medicine. She taken soon and then she should be, fine. We'll see her in a week    Physical Exam  Additional Exam:  RT THUMB WOUND:   SURROUNDING ERYTHEMA NO STREAKING IS PRESENT.   ROM IP JOINT IS NORMAL    Impression & Recommendations:  Problem # 1:  NEOPLASMS UNSPEC NATURE BONE SOFT TISSUE&SKIN (ICD-239.2) Assessment Comment Only  Orders: Post-Op Check (16109)  Problem # 2:  CELLULITIS, HAND (ICD-682.4) Assessment: New  Orders: Post-Op Check (60454)  Patient Instructions: 1)  Come back in a week 2)  take antibiotics   Orders Added: 1)  Post-Op Check [09811]

## 2010-12-23 NOTE — Medication Information (Signed)
Summary: Tax adviser   Imported By: Cammie Sickle 12/16/2010 18:40:39  _____________________________________________________________________  External Attachment:    Type:   Image     Comment:   External Document

## 2010-12-23 NOTE — Assessment & Plan Note (Signed)
Summary: 1 wk RE-CK WOUND/POST OP/[place] M[place],PCP [facility]CGOUGH,REF REC'D   Visit Type:  Follow-up  CC:  post op finger.  History of Present Illness: I saw Bernardina Kalmar in the office today for a followup visit.  She is a 30 years old woman with the complaint of:    excisional biopsy of mass, RIGHT thumb. Path report revealed ganglion.  Date of surgery was January 27. POD # 25  MEDS:  Lorcet 10/650 for pain. Also takes ibuprofen. She is up to 2 tablets at a time.  Today is a one week recheck after starting Keflex 500mg  two times a day. FINISHED antibiotics yesterday. She still having some pain over the RIGHT thumb. It appears to radiate up into the wrist and forearm area.  She has pain with ulnar deviation as well. Pain is 7/10   review of systems no catching or locking.  Physical Exam  Additional Exam:  GENERAL: Appearance is normal   CDV: Normal perfusion to the RIGHT thumb. Good capillary refill.  Sensation remains intact. Inspection revealed the previously noted wound has a clean scab over it. 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 Patient Level III (29562)  Patient Instructions: 1)  thumb splint from [facility]  2)  apply ice to thumb two times a day  3)  apply aspercreme three times a day to thumb 4)  wear brace 4 weeks  5)  return in 4 weeks  6)  DE QUERVAINS SYNDROME    Orders Added: 1)  Est. Patient Level III [13086]

## 2011-01-08 LAB — URINE CULTURE: Culture  Setup Time: 201109112114

## 2011-01-08 LAB — URINE MICROSCOPIC-ADD ON

## 2011-01-08 LAB — DIFFERENTIAL
Basophils Absolute: 0.1 10*3/uL (ref 0.0–0.1)
Basophils Relative: 0 % (ref 0–1)
Basophils Relative: 1 % (ref 0–1)
Eosinophils Absolute: 0 10*3/uL (ref 0.0–0.7)
Eosinophils Relative: 0 % (ref 0–5)
Lymphocytes Relative: 11 % — ABNORMAL LOW (ref 12–46)
Lymphs Abs: 1.2 10*3/uL (ref 0.7–4.0)
Lymphs Abs: 2.6 10*3/uL (ref 0.7–4.0)
Monocytes Absolute: 0.6 10*3/uL (ref 0.1–1.0)
Monocytes Relative: 6 % (ref 3–12)
Monocytes Relative: 13 % — ABNORMAL HIGH (ref 3–12)
Neutro Abs: 9.8 10*3/uL — ABNORMAL HIGH (ref 1.7–7.7)
Neutro Abs: 9.5 10*3/uL — ABNORMAL HIGH (ref 1.7–7.7)
Neutrophils Relative %: 84 % — ABNORMAL HIGH (ref 43–77)
Neutrophils Relative %: 62 % (ref 43–77)
Neutrophils Relative %: 83 % — ABNORMAL HIGH (ref 43–77)

## 2011-01-08 LAB — COMPREHENSIVE METABOLIC PANEL
ALT: 17 U/L (ref 0–35)
AST: 20 U/L (ref 0–37)
Albumin: 4.2 g/dL (ref 3.5–5.2)
Alkaline Phosphatase: 48 U/L (ref 39–117)
BUN: 8 mg/dL (ref 6–23)
CO2: 24 mEq/L (ref 19–32)
Calcium: 8.8 mg/dL (ref 8.4–10.5)
Chloride: 103 mEq/L (ref 96–112)
Chloride: 107 mEq/L (ref 96–112)
Creatinine, Ser: 0.85 mg/dL (ref 0.4–1.2)
Creatinine, Ser: 0.92 mg/dL (ref 0.4–1.2)
GFR calc Af Amer: >60 mL/min (ref >60)
GFR calc non Af Amer: >60 mL/min (ref >60)
Glucose, Bld: 114 mg/dL — ABNORMAL HIGH (ref 70–99)
Potassium: 4 mEq/L (ref 3.5–5.1)
Sodium: 136 mEq/L (ref 135–145)
Total Bilirubin: 0.8 mg/dL (ref 0.3–1.2)

## 2011-01-08 LAB — URINALYSIS, ROUTINE W REFLEX MICROSCOPIC
Glucose, UA: NEGATIVE mg/dL
Glucose, UA: NEGATIVE mg/dL
Glucose, UA: NEGATIVE mg/dL
Ketones, ur: 40 mg/dL — AB
Ketones, ur: >80 mg/dL — AB
Leukocytes, UA: NEGATIVE
Leukocytes, UA: NEGATIVE
Nitrite: NEGATIVE
Protein, ur: 30 mg/dL — AB
Specific Gravity, Urine: 1.02 (ref 1.005–1.030)
Specific Gravity, Urine: 1.025 (ref 1.005–1.030)
Specific Gravity, Urine: >1.03 — ABNORMAL HIGH (ref 1.005–1.030)
Urobilinogen, UA: 0.2 mg/dL (ref 0.0–1.0)
Urobilinogen, UA: 0.2 mg/dL (ref 0.0–1.0)
pH: 6 (ref 5.0–8.0)
pH: 6.5 (ref 5.0–8.0)

## 2011-01-08 LAB — BASIC METABOLIC PANEL
CO2: 25 mEq/L (ref 19–32)
Calcium: 9.4 mg/dL (ref 8.4–10.5)
Calcium: 8.9 mg/dL (ref 8.4–10.5)
GFR calc Af Amer: >60 mL/min (ref >60)
GFR calc Af Amer: >60 mL/min (ref >60)
GFR calc non Af Amer: >60 mL/min (ref >60)
GFR calc non Af Amer: >60 mL/min (ref >60)
Sodium: 140 mEq/L (ref 135–145)
Sodium: 136 mEq/L (ref 135–145)

## 2011-01-08 LAB — CBC
HCT: 42 % (ref 36.0–46.0)
HCT: 44.6 % (ref 36.0–46.0)
Hemoglobin: 13.9 g/dL (ref 12.0–15.0)
MCH: 31.8 pg (ref 26.0–34.0)
MCH: 32.1 pg (ref 26.0–34.0)
MCV: 94.3 fL (ref 78.0–100.0)
MCV: 94.7 fL (ref 78.0–100.0)
Platelets: 276 10*3/uL (ref 150–400)
Platelets: 209 10*3/uL (ref 150–400)
Platelets: 232 10*3/uL (ref 150–400)
RBC: 4.32 MIL/uL (ref 3.87–5.11)
RBC: 4.71 MIL/uL (ref 3.87–5.11)
RDW: 12 % (ref 11.5–15.5)
WBC: 11.7 10*3/uL — ABNORMAL HIGH (ref 4.0–10.5)
WBC: 11.5 10*3/uL — ABNORMAL HIGH (ref 4.0–10.5)

## 2011-01-08 LAB — LIPASE, BLOOD: Lipase: 30 U/L (ref 11–59)

## 2011-01-12 ENCOUNTER — Encounter: Payer: Self-pay | Admitting: Orthopedic Surgery

## 2011-01-12 LAB — DIFFERENTIAL
Basophils Absolute: 0 10*3/uL (ref 0.0–0.1)
Basophils Absolute: 0 10*3/uL (ref 0.0–0.1)
Basophils Relative: 0 % (ref 0–1)
Basophils Relative: 0 % (ref 0–1)
Basophils Relative: 0 % (ref 0–1)
Eosinophils Absolute: 0 10*3/uL (ref 0.0–0.7)
Eosinophils Absolute: 0 10*3/uL (ref 0.0–0.7)
Eosinophils Relative: 0 % (ref 0–5)
Eosinophils Relative: 0 % (ref 0–5)
Eosinophils Relative: 0 % (ref 0–5)
Lymphocytes Relative: 14 % (ref 12–46)
Lymphocytes Relative: 31 % (ref 12–46)
Lymphocytes Relative: 9 % — ABNORMAL LOW (ref 12–46)
Lymphs Abs: 1.6 10*3/uL (ref 0.7–4.0)
Lymphs Abs: 0.7 10*3/uL (ref 0.7–4.0)
Lymphs Abs: 1.6 10*3/uL (ref 0.7–4.0)
Lymphs Abs: 2.5 10*3/uL (ref 0.7–4.0)
Lymphs Abs: 2.9 10*3/uL (ref 0.7–4.0)
Monocytes Absolute: 0.5 10*3/uL (ref 0.1–1.0)
Monocytes Absolute: 0.8 10*3/uL (ref 0.1–1.0)
Monocytes Absolute: 1.2 10*3/uL — ABNORMAL HIGH (ref 0.1–1.0)
Monocytes Relative: 13 % — ABNORMAL HIGH (ref 3–12)
Monocytes Relative: 14 % — ABNORMAL HIGH (ref 3–12)
Monocytes Relative: 15 % — ABNORMAL HIGH (ref 3–12)
Monocytes Relative: 5 % (ref 3–12)
Neutro Abs: 9.2 10*3/uL — ABNORMAL HIGH (ref 1.7–7.7)
Neutro Abs: 6.6 10*3/uL (ref 1.7–7.7)
Neutro Abs: 7.2 10*3/uL (ref 1.7–7.7)
Neutro Abs: 9.6 10*3/uL — ABNORMAL HIGH (ref 1.7–7.7)
Neutrophils Relative %: 69 % (ref 43–77)
Neutrophils Relative %: 80 % — ABNORMAL HIGH (ref 43–77)
Neutrophils Relative %: 86 % — ABNORMAL HIGH (ref 43–77)

## 2011-01-12 LAB — COMPREHENSIVE METABOLIC PANEL
ALT: 35 U/L (ref 0–35)
ALT: 39 U/L — ABNORMAL HIGH (ref 0–35)
AST: 16 U/L (ref 0–37)
AST: 26 U/L (ref 0–37)
Albumin: 3.8 g/dL (ref 3.5–5.2)
Albumin: 3.3 g/dL — ABNORMAL LOW (ref 3.5–5.2)
BUN: 4 mg/dL — ABNORMAL LOW (ref 6–23)
BUN: 5 mg/dL — ABNORMAL LOW (ref 6–23)
BUN: 7 mg/dL (ref 6–23)
CO2: 25 mEq/L (ref 19–32)
Calcium: 8.6 mg/dL (ref 8.4–10.5)
Calcium: 8.2 mg/dL — ABNORMAL LOW (ref 8.4–10.5)
Calcium: 8.6 mg/dL (ref 8.4–10.5)
Calcium: 9.3 mg/dL (ref 8.4–10.5)
Chloride: 107 mEq/L (ref 96–112)
Creatinine, Ser: 0.71 mg/dL (ref 0.4–1.2)
Creatinine, Ser: 0.74 mg/dL (ref 0.4–1.2)
Creatinine, Ser: 0.9 mg/dL (ref 0.4–1.2)
GFR calc Af Amer: >60 mL/min (ref >60)
GFR calc Af Amer: >60 mL/min (ref >60)
Glucose, Bld: 100 mg/dL — ABNORMAL HIGH (ref 70–99)
Glucose, Bld: 122 mg/dL — ABNORMAL HIGH (ref 70–99)
Sodium: 139 mEq/L (ref 135–145)
Sodium: 140 mEq/L (ref 135–145)
Total Bilirubin: 0.6 mg/dL (ref 0.3–1.2)
Total Protein: 6.5 g/dL (ref 6.0–8.3)
Total Protein: 5.9 g/dL — ABNORMAL LOW (ref 6.0–8.3)
Total Protein: 6.2 g/dL (ref 6.0–8.3)
Total Protein: 7.6 g/dL (ref 6.0–8.3)

## 2011-01-12 LAB — URINALYSIS, ROUTINE W REFLEX MICROSCOPIC
Bilirubin Urine: NEGATIVE
Bilirubin Urine: NEGATIVE
Glucose, UA: NEGATIVE mg/dL
Glucose, UA: NEGATIVE mg/dL
Hgb urine dipstick: NEGATIVE
Ketones, ur: 15 mg/dL — AB
Ketones, ur: >80 mg/dL — AB
Leukocytes, UA: NEGATIVE
Leukocytes, UA: NEGATIVE
Nitrite: NEGATIVE
Nitrite: NEGATIVE
Protein, ur: NEGATIVE mg/dL
Protein, ur: 30 mg/dL — AB
Protein, ur: 30 mg/dL — AB
Protein, ur: NEGATIVE mg/dL
Specific Gravity, Urine: >1.03 — ABNORMAL HIGH (ref 1.005–1.030)
Urobilinogen, UA: 0.2 mg/dL (ref 0.0–1.0)
pH: 6 (ref 5.0–8.0)
pH: 6 (ref 5.0–8.0)
pH: 7 (ref 5.0–8.0)

## 2011-01-12 LAB — TSH: TSH: 0.158 u[IU]/mL — ABNORMAL LOW (ref 0.350–4.500)

## 2011-01-12 LAB — POCT CARDIAC MARKERS
CKMB, poc: <1 ng/mL — ABNORMAL LOW (ref 1.0–8.0)
Myoglobin, poc: 54.2 ng/mL (ref 12–200)
Troponin i, poc: <0.05 ng/mL (ref 0.00–0.09)

## 2011-01-12 LAB — BASIC METABOLIC PANEL
BUN: 3 mg/dL — ABNORMAL LOW (ref 6–23)
BUN: 3 mg/dL — ABNORMAL LOW (ref 6–23)
CO2: 26 mEq/L (ref 19–32)
CO2: 27 mEq/L (ref 19–32)
Calcium: 8.4 mg/dL (ref 8.4–10.5)
Chloride: 102 mEq/L (ref 96–112)
Creatinine, Ser: 0.77 mg/dL (ref 0.4–1.2)
GFR calc Af Amer: >60 mL/min (ref >60)
Glucose, Bld: 96 mg/dL (ref 70–99)
Potassium: 3 mEq/L — ABNORMAL LOW (ref 3.5–5.1)

## 2011-01-12 LAB — CBC
HCT: 39.2 % (ref 36.0–46.0)
HCT: 35.3 % — ABNORMAL LOW (ref 36.0–46.0)
HCT: 39 % (ref 36.0–46.0)
HCT: 43.8 % (ref 36.0–46.0)
Hemoglobin: 12.7 g/dL (ref 12.0–15.0)
Hemoglobin: 15.4 g/dL — ABNORMAL HIGH (ref 12.0–15.0)
MCHC: 33.7 g/dL (ref 30.0–36.0)
MCHC: 33.9 g/dL (ref 30.0–36.0)
MCHC: 34.4 g/dL (ref 30.0–36.0)
MCHC: 35.1 g/dL (ref 30.0–36.0)
MCHC: 35.2 g/dL (ref 30.0–36.0)
MCV: 95.3 fL (ref 78.0–100.0)
MCV: 92.8 fL (ref 78.0–100.0)
Platelets: 258 10*3/uL (ref 150–400)
Platelets: 170 10*3/uL (ref 150–400)
Platelets: 220 10*3/uL (ref 150–400)
Platelets: 245 10*3/uL (ref 150–400)
RBC: 4.2 MIL/uL (ref 3.87–5.11)
RDW: 12.4 % (ref 11.5–15.5)
RDW: 11.8 % (ref 11.5–15.5)
RDW: 11.8 % (ref 11.5–15.5)
RDW: 12.1 % (ref 11.5–15.5)
RDW: 12.2 % (ref 11.5–15.5)
WBC: 11.3 10*3/uL — ABNORMAL HIGH (ref 4.0–10.5)
WBC: 10 10*3/uL (ref 4.0–10.5)

## 2011-01-12 LAB — CARDIAC PANEL(CRET KIN+CKTOT+MB+TROPI)
CK, MB: 1.2 ng/mL (ref 0.3–4.0)
Relative Index: 0.7 (ref 0.0–2.5)
Relative Index: 0.8 (ref 0.0–2.5)
Total CK: 146 U/L (ref 7–177)
Total CK: 156 U/L (ref 7–177)
Troponin I: 0.01 ng/mL (ref 0.00–0.06)
Troponin I: <0.01 ng/mL (ref 0.00–0.06)

## 2011-01-12 LAB — BRAIN NATRIURETIC PEPTIDE: Pro B Natriuretic peptide (BNP): 85.2 pg/mL (ref 0.0–100.0)

## 2011-01-12 LAB — HEPATIC FUNCTION PANEL
AST: 20 U/L (ref 0–37)
Bilirubin, Direct: 0.2 mg/dL (ref 0.0–0.3)
Total Protein: 6 g/dL (ref 6.0–8.3)

## 2011-01-12 LAB — URINE MICROSCOPIC-ADD ON

## 2011-01-12 LAB — PREGNANCY, URINE: Preg Test, Ur: NEGATIVE

## 2011-01-12 LAB — RAPID URINE DRUG SCREEN, HOSP PERFORMED: Barbiturates: NOT DETECTED

## 2011-01-14 ENCOUNTER — Ambulatory Visit (INDEPENDENT_AMBULATORY_CARE_PROVIDER_SITE_OTHER): Payer: Medicaid Other | Admitting: Orthopedic Surgery

## 2011-01-14 DIAGNOSIS — M7710 Lateral epicondylitis, unspecified elbow: Secondary | ICD-10-CM

## 2011-01-14 DIAGNOSIS — M7711 Lateral epicondylitis, right elbow: Secondary | ICD-10-CM

## 2011-01-14 NOTE — Progress Notes (Signed)
She has tenderness over the RIGHT lateral epicondyle, decreased range of motion of the wrist and elbow pain. Full wrist extension against resistance  There is tenderness throughout the extensor tendons in the forearm.  The thumb area is nontender. She has painful range of motion in the wrist. Color and perfusion are normal.  Grip strength is weak.  Pulses are normal.   Review of systems no trauma, she says it feels worse when he gets cold X1. Week

## 2011-01-14 NOTE — Patient Instructions (Addendum)
Wear sling   Ice 3 x a day to elbow for 30 min   Wear wrist brace   Take dose pack until finished

## 2011-01-15 MED ORDER — PREDNISONE (PAK) 10 MG PO TABS: 10.0000 mg | ORAL_TABLET | Freq: Every day | ORAL | Status: AC

## 2011-01-15 MED ORDER — METHYLPREDNISOLONE ACETATE 40 MG/ML IJ SUSP: 40.0000 mg | Freq: Once | INTRAMUSCULAR | Status: DC

## 2011-01-21 ENCOUNTER — Ambulatory Visit (INDEPENDENT_AMBULATORY_CARE_PROVIDER_SITE_OTHER): Payer: Medicaid Other | Admitting: Orthopedic Surgery

## 2011-01-21 DIAGNOSIS — M7710 Lateral epicondylitis, unspecified elbow: Secondary | ICD-10-CM

## 2011-01-21 NOTE — Progress Notes (Signed)
Followup for tennis elbow injection. Patient is doing much better. Last week. She was in tears today. She is smiling. He has improved range of motion decreased swelling, tenderness and pain over the lateral epicondyle remained neurovascularly.  She is in a great mood today.  Review of systems is negative. The finger. Surgery is stable, doing well status post ganglion excision.  She is encouraged to do exercises, use ice, and followup as needed

## 2011-01-21 NOTE — Patient Instructions (Addendum)
Epicondylitis, Lateral (Tennis Elbow) with Rehab  Lateral epicondylitis involves inflammation and pain around the outer portion of the elbow. The pain is caused by inflammation of the tendons in the forearm that  bring back (extend) the wrist. Lateral epicondylittis is also called tennis elbow, because it is very common in tennis players. However, it may occur in any individual who extends the wrist repetitively. If lateral epicondylitis is left untreated, it may become a chronic problem.   SYMPTOMS  Pain, tenderness, and inflammation on the outer (lateral) side of the elbow.  Pain or weakness with gripping activities.  Pain that increases with wrist twisting motions (playing tennis, using a screwdriver, opening a door or a jar).  Pain with lifting objects, including a coffee cup.   CAUSES   Lateral epicondylitis is caused by inflammation of the tendons that extend the wrist. Causes of injury may include:  Repetitive stress and strain on the muscles and tendons that extend the wrist.  Sudden change in activity level or intensity.  Incorrect grip in racquet sports.  Incorrect grip size of racquet (often too large).  Incorrect hitting position or technique (usually backhand, leading with the elbow).  Using a racket that is too heavy.   RISK INCREASES WITH    Sports or occupations that require repetitive and/or strenuous forearm and wrist movements (tennis, squash, racquetball, carpentry).  Poor wrist and forearm strength and flexibility.    Failure to warm up properly before activity.  Resuming activity before healing, rehabilitation, and conditioning are complete.   PREVENTIVE MEASURES    Warm up and stretch properly before activity.  Maintain physical fitness: l Strength, flexibility, and endurance. l Cardiovascular fitness.  Wear and use properly fitted equipment.  Learn and use proper technique and have a coach correct improper technique.  Wear a tennis elbow  (counterforce) brace.   PROGNOSIS The course of this condition depends on the degree of the injury. If treated properly, acute cases (symptoms lasting less than 4 weeks) are often resolved in 2 to 6 weeks. Chronic (longer lasting cases) often resolve in 3 to 6 months, but may require physical therapy.   POSSIBLE COMPLICATIONS    Frequently recurring symptoms, resulting in a chronic problem. Properly treating the problem the first time decreases frequency of recurrence.  Chronic inflammation, scarring tendon degeneration, and partial tendon tear, requiring surgery.  Delayed healing or resolution of symptoms.   GENERAL TREATMENT CONSIDERATIONS   Treatment first involves the use of ice and medicine, to reduce pain and inflammation. Strengthening and stretching exercises may help reduce discomfort, if performed regularly. These exercises may be performed at home, if the condition is an acute injury. Chronic cases may require a referral to a physical therapist for evaluation and treatment. Your caregiver may advise a corticosteroid injection, to help reduce inflammation. Rarely, surgery is needed.   MEDICATION:    If pain medicine is needed, nonsteroidal anti-inflammatory medicines (aspirin and ibuprofen), or other minor pain relievers (acetaminophen), are often advised.    Do not take pain medicine for 7 days before surgery.    Prescription pain relievers may be given, if your caregiver thinks they are needed. Use only as directed and only as much as you need.  Corticosteroid injections may be recommended. These injections should be reserved only for the most severe cases, because they can only be given a certain number of times.   HEAT AND COLD:    Cold treatment (icing) should be applied for 10 to 15 minutes every 2  to 3 hours for inflammation and pain, and immediately after activity that aggravates your symptoms. Use ice packs or an ice massage.  Heat treatment may be used before  performing stretching and strengthening activities prescribed by your caregiver, physical therapist, or athletic trainer. Use a heat pack or a warm water soak.     SEEK MEDICAL CARE IF:  Symptoms get worse or do not improve in 2 weeks, despite treatment.     EXERCISES   RANGE OF MOTION AND STRETCHING EXERCISES - Epicondylitis, Lateral (Tennis Elbow) These exercises may help you when beginning to rehabilitate your injury.  Your symptoms may go away with or without further involvement from your physician, physical therapist or athletic trainer. While completing these exercises, remember:    Restoring tissue flexibility helps normal motion to return to the joints. This allows healthier, less painful movement and activity.  An effective stretch should be held for at least 30 seconds.  A stretch should never be painful. You should only feel a gentle lengthening or release in the stretched tissue.     RANGE OF MOTION - Wrist Flexion, Active-Assisted  Extend your __________ elbow with your fingers pointing down.*    Gently pull the back of your hand towards you, until you feel a gentle stretch on the top of your forearm.    Hold this position for __________ seconds.   Repeat __________ times.  Complete this exercise __________ times per day.     *If directed by your physician, physical therapist or athletic trainer, complete this stretch with your elbow bent, rather than extended.     RANGE OF MOTION - Wrist Extension, Active-Assisted   Extend your __________ elbow and turn your palm upwards.*  Gently pull your palm and fingertips back, so your wrist extends and your fingers point more toward the ground.    You should feel a gentle stretch on the inside of your forearm.  Hold this position for __________ seconds.   Repeat __________ times. Complete this exercise __________ times per day. *If directed by your physician, physical therapist or athletic trainer, complete this stretch  with your elbow bent, rather than extended.     STRETCH - Wrist Flexion   Place the back of your __________ hand on a tabletop, leaving your elbow slightly bent. Your fingers should point away from your body.  Gently press the back of your hand down onto the table by straightening your elbow. You should feel a stretch on the top of your forearm.    Hold this position for __________ seconds.   Repeat __________ times. Complete this stretch __________ times per day.      STRETCH - Wrist Extension   Place your __________ fingertips on a tabletop, leaving your elbow slightly bent. Your fingers should point backwards.  Gently press your fingers and palm down onto the table by straightening your elbow. You should feel a stretch on the inside of your forearm.    Hold this position for __________ seconds.   Repeat __________ times. Complete this stretch __________ times per day.        Do exercises for 6 weeks    Document Released: 10/12/2005  Document Re-Released: 08/08/2009 [facility] Patient Information 2011 [place], [place].

## 2011-01-22 NOTE — Medication Information (Signed)
Summary: Tax adviser   Imported By: Cammie Sickle 01/16/2011 09:06:27  _____________________________________________________________________  External Attachment:    Type:   Image     Comment:   External Document

## 2011-03-10 NOTE — Discharge Summary (Signed)
NAMEROBIN, Diane                 ACCOUNT NO.:  [id number]   MEDICAL RECORD NO.:  [id number]          PATIENT TYPE:  OBV   LOCATION:  A312                          FACILITY:  [facility]   PHYSICIAN:  Dorris Singh, DO    DATE OF BIRTH:  [birth year]   DATE OF ADMISSION:  06/14/2008  DATE OF DISCHARGE:  08/21/2009LH                               DISCHARGE SUMMARY   ADMISSION DIAGNOSES:  1. Intractable nausea and vomiting.  2. Untreated urinary tract infection from previous admission.  3. Gastritis.   DISCHARGE DIAGNOSES:  1. Gastritis.  2. Gastroparesis.  3. Urinary tract infection, which is resolved.   Her H&P was done by Dr. Dorris Singh.  Please refer.  Testing that  the patient had done:  She had a gastric emptying study.  She also had  an acute abdominal series on June 14, 2008 which demonstrated no  obstructive bowel gas pattern, no active cardiopulmonary, and also she  had a chest x-ray which demonstrated no acute cardiopulmonary findings.  She also had a CT of the abdomen which showed unremarkable CT, and of  the pelvis.  No acute pelvic findings as well.  The patient's H&P was  done by Dr. Elige Radon.  Please refer, but to summarize, the patient is a  30 year old female who presented to the [facility] on  June 14, 2008 just after being discharged on June 13, 2008.  She  apparently went home and had some food and had intractable nausea and  vomiting.  She also did not fill any of the prescriptions that she was  given that could have helped her as well.  She had an EGD on June 13, 2008 and was found to have severe gastritis and given specific  instructions from GI upon discharge.  The patient was then admitted.  There was some concern as to the effects or any type of complication  from the EGD.  She was given antiemetics and fluids and seemed to be  progressing without any incident.  Also, when she was here for admission  prior to this one, there was  some concern about a bigeminy that occurred  and she had an echocardiogram that was read, however, it was not  completed prior to her discharge even though cardiology felt that she  could be discharged and just to follow up with them at a later date.  On  June 14, 2008 in the evening I was called to see her regarding the  patient becoming very diaphoretic, having hypotension and complaining of  extreme abdominal pain.  At that time we gave her fluid boluses.  We got  an EKG.  She was found to be in bradycardia.  Did get a chest x-ray as  she was saying she was having shortness of breath, and that was  negative.  We got cardiology back on the case, reconsulted them, and got  cardiac enzymes on her and placed her on telemetry.  Over the evening  she did not have any other incident.  Cardiology saw her today.  Her  potassium was  slightly low, however, he felt that it was okay to  discharge her.  Also, GI saw her today and she had a gastric emptying  study done.  We are still awaiting any further results regarding that.  I talked to Dr. Cira Servant after her gastric emptying study.  She agrees that  the patient can be discharged to home at this point in time and has  given me specific recommendations.  1. The patient is to be on a lactose-free diet.  2. She is to report back to see Dr. Cira Servant in 1 month regarding her      gastric emptying study.  Also, she is to be on Bentyl 10 mg 1 p.o.      b.i.d. p.r.n. as needed for diarrhea and nausea whenever she has      these 2 conditions.  It has also been recommended based on Dr.      Cira Servant' encounters and my encounters, that the patient actually be      evaluated by a mental health specialist due to her multiple      psychosocial issues.  We will try to set her up an appointment and      also have her follow up with Dr. Sherwood Gambler in the office as well.  The      patient's condition at discharge is stable.  Her disposition will      be to home.  She can go  back on her medications that she was on      prior to admission which includes Xanax 2 mg 3 times a day, Nexium      40 mg daily, and Lorcet 10/650 every 6 hours.      Dorris Singh, DO  Electronically Signed     CB/MEDQ  D:  06/15/2008  T:  06/15/2008  Job:  [id number]
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
## 2011-03-10 NOTE — H&P (Signed)
[name], [name]                 ACCOUNT NO.:  [id number]   MEDICAL RECORD NO.:  [id number]          PATIENT TYPE:  OBV   LOCATION:  A312                          FACILITY:  [facility]   PHYSICIAN:  Dorris Singh, DO    DATE OF BIRTH:  Aug 20, 1981   DATE OF ADMISSION:  06/14/2008  DATE OF DISCHARGE:  LH                              HISTORY & PHYSICAL   CHIEF COMPLAINT:  Unretractable nausea and vomiting.   HISTORY OF PRESENT ILLNESS:  The patient is a 30 year old female who was  just discharged from our service on June 13, 2008, for unretractable  nausea and vomiting.  She had an EGD on August 19 and was found to have  severe gastritis and was given specific instructions as to what  medications she needed to do and what palliative measures she needed to  take.  The patient also was diagnosed with a UTI and was sent home on  Cipro for 3 days.  The patient did not get any of her medications and  she went and had some vegetable soup that evening, and she started to  throw up and could not stop vomiting.  At that point in time, she  returned to the emergency room for evaluation.   PAST MEDICAL HISTORY:  Hernia, migraine headache, ulcer, chronic pain  syndrome, anxiety disorder, ventricular bigeminy, gastritis and  duodenitis.  She had an echo done in which her EF was 60%, and  everything was within normal limits.   SURGICAL HISTORY:  Cesarean section, cervical disk surgery,  cholecystectomy, and she had an intracranial aneurysm at age 51.   SOCIAL HISTORY:  Lives with son.  Smoker.  She is considered to be  disabled.  Denies any drug use.  She is a nondrinker as well.   She has no known drug allergies.   She currently is on Lortab 10/650, Xanax 2 mg as needed, Nexium 20 mg  once a day, Cipro 500 mg once a day, and Imitrex subcu 6 mg as needed.   REVIEW OF SYSTEMS:  CONSTITUTIONAL:  Negative for fever and chills.  Eyes:  Negative for eye pain or discharge.  Ears, nose, mouth and  throat:  Negative for ear pain, hoarseness, nasal congestion, sinus  pressure.  CARDIOVASCULAR:  Negative for chest pain, palpitations.  RESPIRATORY:  Negative for cough or wheezing.  GASTROINTESTINAL:  Positive for nausea,  vomiting, abdominal pain.  Negative for diarrhea or blood in stool or  jaundice.  GENITOURINARY:  Negative for dysuria, flank pain.  MUSCULOSKELETAL:  Negative for back pain, neck pain.  SKIN:  Negative  for rash or skin lesions.  NEUROLOGIC:  Negative for headache.   PHYSICAL EXAMINATION:  VITAL SIGNS:  Blood pressure 121/71, pulse rate  20, respirations 20, temperature 98.8.  GENERAL:  The patient is a 30 year old female who is currently in no  acute distress.  HEENT:  Head is normocephalic, atraumatic.  Eyes PERRL.  EOMI.  NECK:  Supple.  No lymphadenopathy noted.  HEART:  Regular rate and rhythm.  There is an S1 and S2 noted.  No  murmurs, gallops or rubs.  LUNGS:  Clear to auscultation bilaterally.  ABDOMEN:  Diffuse tenderness, particularly left upper quadrant.  No  organomegaly noted.  EXTREMITIES:  Positive pulses.  No ecchymosis or cyanosis.  SKIN:  Multiple tattoos all over extremities, back and trunk.  NEUROLOGIC:  Alert and oriented x 3.  Cranial nerves II-XII are grossly  intact.   The patient had two abdominal x-rays that showed nonobstructive bowel  gas pattern.  No active cardiopulmonary disease.  Her sodium was 139,  potassium 3.2, chloride 104, carbon dioxide 28, glucose 11, BUN 4,  creatinine 0.85.  Urine was hazy.  There were a few bacteria.  Her white  count was 14.2, hemoglobin 12.5, hematocrit 37.3, and platelet count of  224.  Urine pregnancy test was negative.   ASSESSMENT:  1. Unretractable nausea and vomiting.  2. Untreated urinary tract infection from previous admission.  3. Gastritis.   The plan will be to admit the patient to the service of [facility].  We  will start her on IV fluids.  We will consult GI to see her again for   this admission.  We will place her back on Cipro.  I explained to the  patient that she would not get pain medications.  There was some drug-  seeking behavior during her last visit, but we will go ahead and keep  her comfortable as much as possible, and we will change medical  management as necessary.      Dorris Singh, DO  Electronically Signed     CB/MEDQ  D:  06/14/2008  T:  06/14/2008  Job:  [id number]
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
## 2011-03-10 NOTE — Discharge Summary (Signed)
[name], [name]                 ACCOUNT NO.:  [id number]   MEDICAL RECORD NO.:  [id number]          PATIENT TYPE:  INP   LOCATION:  A327                          FACILITY:  [facility]   PHYSICIAN:  Dorris Singh, DO    DATE OF BIRTH:  08/28/1981   DATE OF ADMISSION:  06/12/2008  DATE OF DISCHARGE:  08/19/2009LH                               DISCHARGE SUMMARY   ADMITTING DIAGNOSES:  1. Acute gastritis, rule out peptic ulcer disease.  2. Nausea and vomiting.  3. Ventricular bigemini on electrocardiogram.  4. History of chronic pain syndrome.  5. History of anxiety disorder.   DISCHARGE DIAGNOSES:  1. Acute gastritis with duodenitis.  2. Migraine headache history.  3. History of chronic pain syndrome.  4. History of anxiety disorder.  5. uti   CONSULTS:  1.  cardiology.  2. [facility].   TESTING:  She had an acute abdominal series on June 12, 2008, which  showed no acute chest or abdominal findings.  She had a CT of the  abdomen on June 12, 2008, which showed under distention of the distal  stomach, suspect concurrent gastric wall thickening consistent with  clinical history of gastritis.  For enteric ossification distention of  the duodenum and distal bowel as described, duodenitis cannot be  excluded.  Cholecystectomy without biliary duct dilation.  CT of the  pelvis, no definite acute pelvic process with trace free fluid, likely  physiologic.  Apparent increased density adjacent to the uterus, most  likely volume averaging and free pelvic fluid.  There are pelvic  symptoms.  If there are, consider a pelvic ultrasound.  She also had an  echocardiogram done and the results are pending.  The patient had an EGD  done today as well.  The findings of that are gastritis and duodenitis.   Her H&P was done by Dr. Rito Ehrlich, please refer.   PRIMARY CARE DOCTOR:  Dr. Sherwood Gambler.   To summarize, she is a 30 year old Caucasian female with the above  disorders who was having problems with nausea and vomiting.  She stated  she vomited more than 20 times last night.  Also, she admitted to having  some chills and fever.  She was admitted to our service with the above  diagnoses  with abdominal pain as well.  She was started on Protonix and  Zofran, and GI was also consulted and the above radiographic testing was  done.  Over the evening, she was found have an abnormal EKG, being in  bigeminy.  This was followed up by cardiology, who felt that they would  repeat echo, but after talking with Dr. Daleen Squibb today, he felt that they  would follow-up with her if anything was abnormal and that she could  still be discharged to home today.  The patient also had an abnormal UA.  She was treated with Cipro and Flagyl.  We will go ahead and send her  home on 3 more days of Cipro as well.  For migraine headache, the  patient complained of having a migraine today.  She has a known history  of this.  I went ahead and gave her an Imitrex injection and will send  her home with a prescription for that as well.  She was found to have  duodenitis after she had an EGD today, and it is recommended that she  stay away from all NSAIDs.  She continued to have periods with emotional  liability with doctors and staff, and after I spoke with cardiology, I  was planning on discharging her.  The patient came up to the gestation  stating she wanted to be transferred to [facility].  At this point in time,  there is no reason to transfer her to [facility] and she is being  discharged to home, but she feels she needs more follow up and it is at  the patient's own initiative to follow up at [facility], but from  our standpoint all the necessary workup has been concluded.  She can  follow up with her primary care doctor at discharge as well.   Her instructions at discharge are several;  She can continue her Nexium  20 mg daily.  She can continue her Xanax 2 mg as needed and her  Lortab  10/650 mg as needed.  Per GI recommendations, no aspirin or NSAIDs until  30 days.  No anticoagulants for 5 days and add Bentyl 10 mg twice a day.  We will also place her on Imitrex 6 mg subcu for headache onset and  repeat in 2 hours x1, as well as Cipro 500 mg x3 days.  It is  recommended that she have a lactose-free diet and avoid any gastric  irritants.  She is to see Dr. Sherwood Gambler and to follow-up with [facility]iology in 3-7 days, if not sooner.  We will set up an appointment  with her regarding these.  She is to return if symptoms worsen or to go  to the nearest emergency room if the patient feels that is necessary.   Her condition is stable and her discharge will be to home.      Dorris Singh, DO  Electronically Signed     CB/MEDQ  D:  06/13/2008  T:  06/13/2008  Job:  [id number]   cc:   Madelin Rear. Sherwood Gambler, MD  Fax: [phone]

## 2011-03-13 NOTE — Op Note (Signed)
NAMEARDENIA, STINER                 ACCOUNT NO.:  [id number]   MEDICAL RECORD NO.:  [id number]          PATIENT TYPE:  AMB   LOCATION:  DAY                           FACILITY:  [facility]   PHYSICIAN:  Tilda Burrow, M.D. DATE OF BIRTH:  [birth year]   DATE OF PROCEDURE:  10/15/2004  DATE OF DISCHARGE:                                 OPERATIVE REPORT   PREOPERATIVE DIAGNOSIS:  Right ectopic pregnancy, left hydrosalpinx.   POSTOPERATIVE DIAGNOSES:  1.  Right ectopic pregnancy, left hydrosalpinx.  2.  Fitz-Hugh-Curtis syndrome (perihepatic adhesions).   PROCEDURE:  Laparoscopic bilateral salpingectomy, lysis of perihepatic  adhesions.   SURGEON:  Tilda Burrow, M.D.   ASSISTANTRolm Baptise, CST.   ANESTHESIA:  General.   COMPLICATIONS:  None.   FINDINGS:  Tiny bit of omental adhesions to anterior abdominal wall. Minimal  intra-abdominal oozing from the ectopic pregnancy, evidence of damaged  fimbria on left side resulting in left hydrosalpinx.   DESCRIPTION OF PROCEDURE:  The patient was taken to the operating room and  prepped and draped for abdominal procedure with low lithotomy leg support.  Foley catheter was in place. An infraumbilical vertical 1-cm skin incision  was made as well as the transverse 2 cm incision at the level of the old  Cesarean section. A Veress needle was used through the umbilicus while  elevating the abdomen and orienting the needle toward the pelvis, and  peritoneal cavity entered. Water droplet test used to confirm appropriate  location and pneumoperitoneum achieved under 11 mmHg to 3 liters of  insufflation. Laparoscopic 10-mm trocar was introduced under direct  laparoscopic visualization, and the abdomen revealed old blood from the  ectopic, less than 20 cc. We proceeded with suprapubic 11-mm trocar and  right lower quadrant 5-mm trocar placements under direct visualization.  Attention was then directed to the pelvis where the right ectopic could be  visualized. The laparoscopic grasping instruments were placed through the  right lower quadrant port, though the gyrus 5-mm bipolar cautery and cutting  device used through the umbilical site, and we cauterized and transected the  tube at the cornu and then amputated the tube off of the mesosalpinx with  serial cauteries and cutting processes. Specimen was secured, and  cervicovaginal fornix, and the left tube inspected. The distal portion of  the tube was damaged as had been noted at prior ectopic surgery with her  first ectopic. We proceeded to excise the tube to prevent further  complications. Salpingectomy was performed on the left side in a similar  fashion as described for the right side, then the pelvis irrigated copiously  and photos documenting the process were taken. Inspection of the upper  abdomen revealed no evidence of trauma or bleeding but did show perihepatic  thin filmy adhesions that were transected and then the abdomen irrigated,  deflated of CO2,  and then the fascia closed a the umbilical and suprapubic sites with 0 Dexon  interrupted suture. Staple closure of the skin completed the procedure with  patient going to the recovery room in good condition. Blood type O  positive  is confirmed.     John   JVF/MEDQ  D:  10/15/2004  T:  10/15/2004  Job:  [id number]

## 2011-03-13 NOTE — Discharge Summary (Signed)
[name]  Patient:    [name], [name] Visit Number: [id number] MRN: [id number]          Service Type: MED Location: [phone] Attending Physician:  Lovena Le Dictated by:   Lovena Le, D.D.S Admit Date:  12/29/2001 Discharge Date: 12/31/2001                             Discharge Summary  [name] was admitted to my service on March 6 with an admission diagnosis of right parapharyngeal space infection.  She was treated with IV antibiotics and underwent surgery for incision and drainage of the right parapharyngeal space infection.  She had an essentially uneventful hospital course.  Her intraoral and extraoral swelling decreased.  Her airway improved dramatically over the next two days.  Her white blood cell count trended downward and she defervesced as well.  She demonstrated adequate p.o. intake and ambulation and was subsequently discharged to home on December 31, 2001.  She was discharged with home wound care instructions as well as antibiotics including clindamycin 300 mg p.o. q.6h. and Vicodin one to two tablets q.4-6h. p.r.n. pain and was given instructions to call my office [phone] for a follow-up appointment on the following Monday. Dictated by:   Lovena Le, D.D.S Attending Physician:  Lovena Le DD:  01/07/02 TD:  01/09/02 Job: [id number]
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
## 2011-03-13 NOTE — H&P (Signed)
NAMEJULIEANN, DRUMMONDS                           ACCOUNT NO.:  [id number]   MEDICAL RECORD NO.:  [id number]                   PATIENT TYPE:  INP   LOCATION:  A417                                 FACILITY:  [facility]   PHYSICIAN:  Lazaro Arms, M.D.                DATE OF BIRTH:  December 24, 1980   DATE OF ADMISSION:  08/20/2003  DATE OF DISCHARGE:  08/22/2003                                HISTORY & PHYSICAL   HISTORY OF PRESENT ILLNESS:  Teaira is a 30 year old female, gravida 3, para  1, ectopic 1, estimated date of delivery February 06, 2004 at [redacted] weeks  gestation who presents complaining of nausea and vomiting for the past  couple of days that has responded to conservative therapy and Phenergan at  home. As a result she is admitted, she is admitted for IV hydration and  nausea control.   PAST MEDICAL HISTORY:  Negative.   PAST OB HISTORY:  Vaginal delivery and ectopic.   PAST SURGICAL HISTORY:  Ectopic.   REVIEW OF SYMPTOMS:  Otherwise negative except for HPI.   PHYSICAL EXAMINATION:  HEENT:  Unremarkable.  Thyroid is normal.  LUNGS:  Clear.  HEART:  Regular rhythm without murmur, regurg or gallop.  BREASTS:  Without mass, discharge or skin changes.  ABDOMEN:  Benign.  PELVIC:  Deferred.  EXTREMITIES:  Warm with no edema.   IMPRESSION:  [redacted] week gestation, hyperemesis gravidarum.   PLAN:  The patient is admitted for IV hydration.     ___________________________________________                                         Lazaro Arms, M.D.   Loraine Maple  D:  09/27/2003  T:  09/27/2003  Job:  [id number]

## 2011-03-13 NOTE — Op Note (Signed)
[name], NEISES                           ACCOUNT NO.:  [id number]   MEDICAL RECORD NO.:  [id number]                   PATIENT TYPE:  INP   LOCATION:  A410                                 FACILITY:  [facility]   PHYSICIAN:  Tilda Burrow, M.D.              DATE OF BIRTH:  1981-05-05   DATE OF PROCEDURE:  01/20/2004  DATE OF DISCHARGE:                                 OPERATIVE REPORT   PREOPERATIVE DIAGNOSES:  1. Pregnancy at 37 weeks and 4 days for repeat cesarean section.  2. Not for trial of labor.  3. Spontaneous rupture of membranes.  4. Abdominal cicatrix with creep.   POSTOPERATIVE DIAGNOSES:  1. Pregnancy at 37 weeks and 4 days for repeat cesarean section.  2. Not trial of labor.  3. Spontaneous rupture of membranes.  4. Abdominal cicatrix with creep.   OPERATION/PROCEDURE:  1. Repeat low transverse cervical cesarean section.  2. Wide excision of cicatrix.   SURGEON:  Tilda Burrow, M.D.   ASSISTANTMarlinda Mike, R.N., FA   ANESTHESIA:  Spinal by [name], CRNA.   COMPLICATIONS:  None.   FINDINGS:  Healthy female infant, Apgars 9 and 9, weight __________ managed by  [name].   ESTIMATED BLOOD LOSS:  800 mL.   COMPLICATIONS:  None.   SPECIMENS:  None.   INDICATIONS:  A 30 year old, gravida 3, para 1, AB 1 (ectopic), presenting  with spontaneous rupture since 5:30 p.m. on Easter Sunday.   DESCRIPTION OF PROCEDURE:  The patient was taken to the operating room,  prepped and draped in the usual fashion for lower abdominal surgery.  Foley  catheter had been inserted in the labor and delivery suite.  The old  cicatrix was widely excised, removing a 30 cm long x 10 cm wide at its  maximum diameter ellipse of skin and underlying connective tissue.  A layer  of fatty tissue was left on the surface of the fascia.  The Pfannenstiel-  type incision technique was then used to open the 15 cm transverse incision  through the fascia which would allow elevation and  splitting between the  rectus muscles and entering the peritoneal cavity without difficulty.  Bladder flap was developed in the lower uterine segment, transverse incision  made through the thinned-out lower uterine segment, and the fetal vertex  guided through the incision using manual guidance initially with vacuum  assistance as well, using fundal pressure as the primary propulsive force.  The uterus was then evacuated with the placenta delivered in [place]  presentation.  Cord blood samples were obtained and cord blood gas is in the  chart.  The uterus was irrigated with antibiotic solution, single layer  running locking closure of the uterus performed.  There was pulsatile  bleeding from the left uterine vessel.  The left uterine artery is at the  end of the incision so two additional sutures were necessary to  encircle the  left uterine artery to complete hemostasis.  The uterus was exteriorized in  order to perform this safely and the operator's right index finger and  middle finger were placed behind the broad ligament to protect the bowel.  The hemostasis was then satisfactory and bladder flap was closed with  interrupted 2-0 chromic.  The uterus was repositioned, abdomen irrigated and  laparotomy tapes removed.  Anterior peritoneum was closed with 2-0 chromic  as was the bladder flap.  Then the fascia closed.  The somewhat irregular  edges of the fascia were trimmed on the upper side and it was pulled  together nicely with good tissue approximation.  The subcutaneous tissues  were irrigated with antibiotic solution and confirmed as hemostatic,  reapproximated with subcutaneous interrupted flat 2-0 plain sutures to  reapproximate skin edges by traction and the subcuticular space tissues.  We  then stapled closed the skin.  Prior to staple closure of the skin, we  placed a flat JP drain which was allowed to exit to the left lower quadrant  just below the skin incision.  Suction was  placed and the patient then had  dressings placed.  She went to recovery in good condition.      ___________________________________________                                            Tilda Burrow, M.D.   JVF/MEDQ  D:  01/20/2004  T:  01/21/2004  Job:  [id number]

## 2011-03-13 NOTE — Op Note (Signed)
[name], [name]                           ACCOUNT NO.:  [id number]   MEDICAL RECORD NO.:  [id number]                   PATIENT TYPE:  OBV   LOCATION:  A416                                 FACILITY:  [facility]   PHYSICIAN:  Tilda Burrow, M.D.              DATE OF BIRTH:  10/08/1981   DATE OF PROCEDURE:  07/10/2002  DATE OF DISCHARGE:  07/10/2002                                 OPERATIVE REPORT   PREOPERATIVE DIAGNOSIS:  Right ectopic pregnancy.   POSTOPERATIVE DIAGNOSES:  1. Right ectopic pregnancy.  2. Left hydrosalpinx.   PROCEDURES:  1. Right linear salpingostomy.  2. Left needle salpingostomy.   SURGEON:  Tilda Burrow, M.D.   ASSISTANT:  None.   ANESTHESIA:  General.   COMPLICATIONS:  None.   FINDINGS:  1. Large amount of blood in the pelvis.  2. A dilated right distal fallopian tube.  3. Left hydrosalpinx.  4. 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   DESCRIPTION OF PROCEDURE:  The patient was taken to the operating room,  prepped and draped for combined abdominal and vaginal procedure with  infraumbilical and suprapubic incisions performed as well as right lower  quadrant incision.  Each one was approximately 1-cm in length.  The  infraumbilical vertical 1-cm skin incision was used to introduce the Veress  needle while elevating the abdomen and this allowed pneumoperitoneum easily  to be obtained.  Proper positioning was confirmed with the water droplet  test.  The laparoscopic trocar was introduced through the umbilicus.  The  suprapubic trocar introduced under direct visualization and attention  directed to the pelvis where generous bloody fluid was encountered and photo  A shows blood during irrigation.  Photo 2 shows the dilated distal right  tubal pregnancy as does photo 4.  Photo 3 shows the left hydrosalpinx.  We  then proceeded to infiltrate beneath the right ectopic with said dilute  Pitressin  solution and then an antimesenteric linear incision was made with  Metzenbaum scissors identifying a large amount of clot which could be  extracted from the distended tubal lumen and additionally we extracted some  irregular tissue felt to represent ectopic tissue on the right side,  extracted from within the tubal structure.  Irrigation with saline was then  performed and hemostasis was confirmed.  We then inspected the left tube and  decided an effort at needle salpingostomy was indicated.  Efforts to remove  the tube in toto were felt to be worthy of delay until we see how she heals  from this single episode.  A linear incision was made and then transected at  the tip of the tube transversely to make a star-shaped incision which would  reduce chances of reagglutination.  Clear fluid was obtained from the  hydrosalpinx.  The possibility that this would reagglutinate is certainly  present.  Further inspection of the abdomen revealed thin, filmy, Fitz-Hugh  Curtis adhesions of the liver to the anterior abdominal wall and blood above  the liver edge which was suctioned out.  The right tube was again inspected  and confirmed as hemostatic.  The abdomen irrigated once more.  Approximately 200 cc of saline left inside the abdomen.  Microscopic  equipment removed, then the specimen sent to histology.  Blood type is Rh-  positive.                                                Tilda Burrow, M.D.    JVF/MEDQ  D:  08/13/2002  T:  08/14/2002  Job:  [id number]
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
## 2011-03-13 NOTE — Discharge Summary (Signed)
[name], [name] NO.:  [id number]   MEDICAL RECORD NO.:  [id number]                  PATIENT TYPE:   LOCATION:                                       FACILITY:   PHYSICIAN:  Tilda Burrow, M.D.              DATE OF BIRTH:   DATE OF ADMISSION:  01/20/2004  DATE OF DISCHARGE:  01/22/2004                                 DISCHARGE SUMMARY   ADMITTING DIAGNOSIS:  1. Pregnancy 37 weeks 4 days.  2. Spontaneous rupture of membranes, not for trial of labor.  3. Abdominal wall laxity with old scar requesting excision of cicatrix.   DISCHARGE DIAGNOSES:  1. Pregnancy [redacted] weeks gestation; repeat C-section, delivered.  2. Postoperative anemia secondary to intraoperative blood loss.  3. Neonatal transient tachypnea of newborn.   PROCEDURE:  January 20, 2004:  Repeat low transverse cervical cesarean section  with wide excision of cicatrix.   DISCHARGE MEDICATIONS:  1. Tylox 15 tablets 1 p.o. q.4h. p.r.n. pain.  2. Chromagen Forte 1 p.o. b.i.d. x30 days.   HOSPITAL SUMMARY:  This 30 year old, gravida 3, para 1, AB 1 presented with  spontaneous rupture of membranes at 5 p.m. on Easter Sunday.  She was  admitted for repeat cesarean section and wide excision of cicatrix.  She was  previously scheduled for C-section approximately 1-1/2 weeks in the future.  See HPI and operative report for details.   HOSPITAL COURSE:  The patient was admitted with admitting hemoglobin 11.0,  hematocrit 31.9.  She was prepped for surgery the evening of admission and  taken in a controlled fashion to cesarean section in the early evening hours  of January 20, 2004.  There was greater than normal blood loss due to  laceration into the left uterine vessels, including the left uterine artery,  so additional sutures were necessary to encircle the left uterine artery  above and below the Pfannenstiel transverse lower uterine segment incision  to adequately control hemostasis.  EBL  was estimated intraoperative at 800  cc, but there was good hemostasis at the end of the procedure.   Postoperatively the patient had a hemoglobin of 7.6, hematocrit 22%.  She  remained hemodynamically stable through the night.  She had received  generous hydration of greater than 3 liters intraoperatively.  She was not  felt to be bleeding inside the belly or any time after the surgery.   Unfortunately the infant did poorly and required 40% oxygen for greater than  24 hours and was, therefore, transferred on the evening of January 21, 2004.  The patient was given discharge instructions for early on the morning of  January 22, 2004 so that she could be with her baby.  She was discharged home,  at that time, for follow up care in 1 week for staple removal and drain  removal in our office.     ___________________________________________  Tilda Burrow, M.D.   JVF/MEDQ  D:  01/28/2004  T:  01/29/2004  Job:  [id number]   cc:   Tilda Burrow, M.D.  [address] [place]  [place] [address]  Fax: [phone]
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
## 2011-03-13 NOTE — Procedures (Signed)
[name], [name]                 ACCOUNT NO.:  [id number]   MEDICAL RECORD NO.:  [id number]          PATIENT TYPE:  OUT   LOCATION:  RAD                           FACILITY:  [facility]   PHYSICIAN:  Dani Gobble, MD       DATE OF BIRTH:  Feb 03, 1981   DATE OF PROCEDURE:  DATE OF DISCHARGE:                                  ECHOCARDIOGRAM   REFERRING PHYSICIANS:  Madelin Rear. Sherwood Gambler, MD, Dani Gobble, M.D.   INDICATIONS:  Ms. Steuart is a 30 year old female without significant past  medical history.  She has been experiencing chest pain.   The technical quality of the study is adequate.   The aorta is within normal limits at 2.8 cm.  The left atrium is also within  normal limits at 3.4 cm.  No obvious clots or masses were appreciated.  The  patient appeared to be in a sinus rhythm during this procedure.  The  intraventricular septum and posterior wall are within normal limits in  thickness at 1.1 cm and 1 cm respectively.  The aortic valve appears thin  and pliable.  It is probably trileaflet, although the left coronary cusp is  not well visualized.  Leaflet excursion is normal.  No significant aortic  insufficiency is noted.  Doppler interrogation of the aortic valve is within  normal limits.   The mitral valve also appears structurally normal.  No mitral valve prolapse  is noted.  Trivial and probably physiologic mitral regurgitation is noted.  Doppler interrogation of the mitral valve is within normal limits.   The pulmonic valve is incompletely visualized.  The tricuspid valve also  appears grossly and structurally normal with trivial and probably  physiologic tricuspid regurgitation.   The left ventricle is normal in size with the LVIDD measuring at 4.8 cm, and  the LVISD measured at 3.6 cm.  Overall left ventricular systolic function  appears to be at the low limits of normal.  No regional wall motion  abnormalities are appreciated.  There is no evidence for diastolic   dysfunction.   The right atrium and right ventricle are normal in size, and right  ventricular systolic function appears to be normal.  There is no pericardial  effusion noted.   IMPRESSION:  1.  Normal left ventricular size and low-normal left ventricular systolic      function.  2.  No mitral valve prolapse.  3.  Normal chamber sizes.  4.  Trivial and probably physiologic mitral and tricuspid regurgitation.      AB/MEDQ  D:  11/20/2004  T:  11/20/2004  Job:  [id number]   cc:   Madelin Rear. Sherwood Gambler, MD  [address]  [place]  [place] [address]  Fax: [phone]

## 2011-03-13 NOTE — Discharge Summary (Signed)
[name], [name]                 ACCOUNT NO.:  [id number]   MEDICAL RECORD NO.:  [id number]          PATIENT TYPE:  INP   LOCATION:  A326                          FACILITY:  [facility]   PHYSICIAN:  Madelin Rear. Sherwood Gambler, MD  DATE OF BIRTH:  [birth year]   DATE OF ADMISSION:  08/16/2006  DATE OF DISCHARGE:  10/24/2007LH                                 DISCHARGE SUMMARY   DISCHARGE MEDICATIONS:  1. Levaquin 5 mg p.o. daily.  2. Phenergan 25 mg q.i.d. p.r.n. nausea.  3. Lorcet 10/500 q.i.d. p.r.n. pain.  4. Xanax 2 mg p.o. t.i.d. p.r.n.  5. Nexium 40 mg p.o. daily.   DISCHARGE DIAGNOSES:  Gastroenteritis.   SUMMARY:  The patient was admitted with five days of excessive vomiting.  She had no hematemesis, hematochezia, melena, feverish, aches or chills.  She had poor oral intake and was admitted for hydration.  She had no active  past medical problems of note and she was hydrated.  She responded well to  parenteral hydration and gradual advancement of diet symptomatic control.  She is empirically placed on Levaquin with resolution of her symptoms  pending stool studies.      Madelin Rear. Sherwood Gambler, MD  Electronically Signed     LJF/MEDQ  D:  09/10/2006  T:  09/11/2006  Job:  [id number]
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
## 2011-03-13 NOTE — Op Note (Signed)
[place]  Patient:    [name], [name] Visit Number: [id number] MRN: [id number]          Service Type: MED Location: [phone] Attending Physician:  Lovena Le Dictated by:   Lovena Le, D.D.S. Proc. Date: 12/29/01 Admit Date:  12/29/2001                             Operative Report  PREOPERATIVE DIAGNOSIS:  Right parapharyngeal abscess.  POSTOPERATIVE DIAGNOSIS:  Right parapharyngeal abscess.  PROCEDURE:  Incision and drainage of right parapharyngeal abscess.  SURGEON:  Lovena Le, D.D.S.  INDICATIONS:  The patient is an otherwise 30 year old who developed a right parapharyngeal abscess secondary to an infected wisdom tooth #32.  The wisdom tooth was removed approximately one week ago, and she was treated with antibiotics.  However, the infection ensued and progressed.  Currently, she is experiencing dysphagia, airway displacement to the left, lateral pharyngeal swelling, as well as engorgement of a right cervical lymph node.  ANESTHESIA:  General via nasotracheal intubation.  DESCRIPTION OF PROCEDURE:  The patient was brought to the operating room and placed on the operating room table in the supine position.  Following successful induction of general anesthesia, the patient was prepped and draped in the usual sterile fashion for a procedure of this type.  Next, the inferior border of the mandible was marked with a marking pen.  The planned incision site was also marked approximately 3 cm inferior to the right angle of the mandible.  The skin and subcutaneous tissue was then infiltrated with 3 cc of a 1% lidocaine solution with 1:100,000 epinephrine.  Next, the skin incision was created in the marked location, extending for approximately 2 cm.  Blunt dissection was then carried out with a hemostat, penetrating the platysma and extending medially and superiorly to the inferior border of the mandible.  The abscess  was then entered at the inferior and medial aspect of the mandible, thereby producing copious flow of purulent material.  At this point, cultures were obtained both aerobic and anaerobic.  The operative site was then thoroughly irrigated with normal saline. Approximately 5 cc of pus were obtained from the site.  Next, a 1/2 inch by 9 cm Penrose drain was placed into the lateral pharyngeal space, and secured to the skin with two 4-0 nylon sutures.  Following this, the masticator space and lateral aspect of the mandible was also explored with a hemostat.  No further pus pockets were encountered however.  Attention was then directed intraorally.  The decision was made not to drain any intraoral sites as these locations were assessed via the extraoral drainage.  The mouth was thoroughly irrigated with normal saline and suctioned dry.  This completed the procedure.  A sterile dressing was applied to the extraoral drain sites.  The patient was allowed to recover from general anesthesia and was extubated in the operating room and transported to the postanesthesia care unit in satisfactory condition.  All sponge, needle, and instrument counts were correct at the conclusion of the case. Dictated by:   Lovena Le, D.D.S. Attending Physician:  Lovena Le DD:  12/29/01 TD:  12/31/01 Job: [id number]

## 2011-03-21 ENCOUNTER — Emergency Department (HOSPITAL_COMMUNITY)
Admission: EM | Admit: 2011-03-21 | Discharge: 2011-03-21 | Disposition: A | Discharge status: Home / Self Care | Payer: Medicaid Other | Attending: Emergency Medicine | Admitting: Emergency Medicine

## 2011-03-21 DIAGNOSIS — Z79899 Other long term (current) drug therapy: Secondary | ICD-10-CM | POA: Insufficient documentation

## 2011-03-21 DIAGNOSIS — Z8711 Personal history of peptic ulcer disease: Secondary | ICD-10-CM | POA: Insufficient documentation

## 2011-03-21 DIAGNOSIS — E860 Dehydration: Secondary | ICD-10-CM | POA: Insufficient documentation

## 2011-03-21 DIAGNOSIS — N390 Urinary tract infection, site not specified: Secondary | ICD-10-CM | POA: Insufficient documentation

## 2011-03-21 DIAGNOSIS — K219 Gastro-esophageal reflux disease without esophagitis: Secondary | ICD-10-CM | POA: Insufficient documentation

## 2011-03-21 DIAGNOSIS — K5900 Constipation, unspecified: Secondary | ICD-10-CM | POA: Insufficient documentation

## 2011-03-21 DIAGNOSIS — R112 Nausea with vomiting, unspecified: Secondary | ICD-10-CM | POA: Insufficient documentation

## 2011-03-21 DIAGNOSIS — G43909 Migraine, unspecified, not intractable, without status migrainosus: Secondary | ICD-10-CM | POA: Insufficient documentation

## 2011-03-21 LAB — URINE MICROSCOPIC-ADD ON

## 2011-03-21 LAB — POCT PREGNANCY, URINE: Preg Test, Ur: NEGATIVE

## 2011-03-21 LAB — URINALYSIS, ROUTINE W REFLEX MICROSCOPIC
Leukocytes, UA: NEGATIVE
Nitrite: NEGATIVE
Specific Gravity, Urine: >1.03 — ABNORMAL HIGH (ref 1.005–1.030)
pH: 6 (ref 5.0–8.0)

## 2011-03-21 LAB — CBC
MCV: 94.2 fL (ref 78.0–100.0)
Platelets: 270 10*3/uL (ref 150–400)
RBC: 4.45 MIL/uL (ref 3.87–5.11)
WBC: 14.6 10*3/uL — ABNORMAL HIGH (ref 4.0–10.5)

## 2011-03-21 LAB — DIFFERENTIAL
Eosinophils Absolute: 0 10*3/uL (ref 0.0–0.7)
Eosinophils Relative: 0 % (ref 0–5)
Monocytes Absolute: 1.3 10*3/uL — ABNORMAL HIGH (ref 0.1–1.0)
Neutrophils Relative %: 71 % (ref 43–77)

## 2011-03-21 LAB — BASIC METABOLIC PANEL
Chloride: 100 mEq/L (ref 96–112)
GFR calc Af Amer: >60 mL/min (ref >60)
Potassium: 3.4 mEq/L — ABNORMAL LOW (ref 3.5–5.1)

## 2011-03-22 LAB — URINE CULTURE

## 2011-03-23 ENCOUNTER — Emergency Department (HOSPITAL_COMMUNITY)
Admission: EM | Admit: 2011-03-23 | Discharge: 2011-03-23 | Disposition: A | Discharge status: Home / Self Care | Payer: Medicaid Other | Attending: Emergency Medicine | Admitting: Emergency Medicine

## 2011-03-23 DIAGNOSIS — F411 Generalized anxiety disorder: Secondary | ICD-10-CM | POA: Insufficient documentation

## 2011-03-23 DIAGNOSIS — R112 Nausea with vomiting, unspecified: Secondary | ICD-10-CM | POA: Insufficient documentation

## 2011-03-23 DIAGNOSIS — N390 Urinary tract infection, site not specified: Secondary | ICD-10-CM | POA: Insufficient documentation

## 2011-03-23 DIAGNOSIS — K219 Gastro-esophageal reflux disease without esophagitis: Secondary | ICD-10-CM | POA: Insufficient documentation

## 2011-03-23 LAB — DIFFERENTIAL
Eosinophils Relative: 0 % (ref 0–5)
Lymphocytes Relative: 12 % (ref 12–46)
Lymphs Abs: 1.3 10*3/uL (ref 0.7–4.0)
Monocytes Absolute: 0.8 10*3/uL (ref 0.1–1.0)
Monocytes Relative: 8 % (ref 3–12)

## 2011-03-23 LAB — BASIC METABOLIC PANEL
BUN: 7 mg/dL (ref 6–23)
CO2: 28 mEq/L (ref 19–32)
Calcium: 8.4 mg/dL (ref 8.4–10.5)
GFR calc non Af Amer: >60 mL/min (ref >60)
Glucose, Bld: 129 mg/dL — ABNORMAL HIGH (ref 70–99)

## 2011-03-23 LAB — CBC
HCT: 36.9 % (ref 36.0–46.0)
MCHC: 34.1 g/dL (ref 30.0–36.0)
MCV: 94.6 fL (ref 78.0–100.0)
RDW: 12.2 % (ref 11.5–15.5)

## 2011-04-23 ENCOUNTER — Ambulatory Visit (INDEPENDENT_AMBULATORY_CARE_PROVIDER_SITE_OTHER): Payer: Medicaid Other | Admitting: Orthopedic Surgery

## 2011-04-23 DIAGNOSIS — M7710 Lateral epicondylitis, unspecified elbow: Secondary | ICD-10-CM | POA: Insufficient documentation

## 2011-04-23 MED ORDER — METHYLPREDNISOLONE ACETATE 40 MG/ML IJ SUSP: 40.0000 mg | Freq: Once | INTRAMUSCULAR | Status: DC

## 2011-04-23 NOTE — Progress Notes (Signed)
Chief complaint is RIGHT elbow pain  No history of trauma.  31 year old female history of previous tennis elbow treated by injection bracing with good result  Lateral elbow pain nonradiating no numbness no tingling  Exam tenderness over the lateral epicondyles painful range of motion normal strength elbow stability normal skin intact pulse good  Lateral epicondylitis  Injection  Had bilateral freeze 3 times a day ice 3 times a day  Come back 3 weeks

## 2011-04-23 NOTE — Patient Instructions (Signed)
You have received a steroid shot. 15% of patients experience increased pain at the injection site with in the next 24 hours. This is best treated with ice and tylenol extra strength 2 tabs every 8 hours. If you are still having pain please call the office.  Apply max freeze 3 x a day   Apply ice 3 x a day   REST!

## 2011-04-23 NOTE — Procedures (Signed)
  Inject RIGHT tennis elbow Verbal consent  Time out  Sterile technique.  Alcohol. Ethyl chloride Depo-Medrol 40 mg Lidocaine 1% Inject lateral elbow for lateral epicondylitis no complications

## 2011-05-14 ENCOUNTER — Ambulatory Visit: Payer: Medicaid Other | Admitting: Orthopedic Surgery

## 2011-05-14 ENCOUNTER — Ambulatory Visit (INDEPENDENT_AMBULATORY_CARE_PROVIDER_SITE_OTHER): Payer: Medicaid Other | Admitting: Orthopedic Surgery

## 2011-05-14 DIAGNOSIS — M7710 Lateral epicondylitis, unspecified elbow: Secondary | ICD-10-CM

## 2011-05-14 NOTE — Progress Notes (Signed)
30 yo female   Followed for  RIGHT elbow pain possible cerebral previous injection for the second time 2 weeks ago did not make much improvement  Recommend that she start physical therapy.  I don't think surgery is in her best interspace on her chronic use of hydrocodone 10 mg dosing.  She does not seem to be a person who could tolerate the surgical treatment.  We will progress treatment the patient start therapy if she doesn't improve casting.  She does have Medicaid and cannot do tenosynovitis she does have a wrist brace which should help.  7 weeks she is unrelated to be tried therapy but him and she comes back.

## 2011-05-14 NOTE — Patient Instructions (Signed)
Wear brace on wrist  Ice elbow 3 x a day   Return in 7 weeks  Start PT As soon as possible

## 2011-06-02 ENCOUNTER — Ambulatory Visit (HOSPITAL_COMMUNITY): Payer: Medicaid Other | Admitting: Specialist

## 2011-06-10 ENCOUNTER — Ambulatory Visit (HOSPITAL_COMMUNITY)
Admission: RE | Admit: 2011-06-10 | Discharge: 2011-06-10 | Disposition: A | Discharge status: Home / Self Care | Payer: Medicaid Other | Source: Ambulatory Visit | Attending: Orthopedic Surgery | Admitting: Orthopedic Surgery

## 2011-06-10 DIAGNOSIS — M25629 Stiffness of unspecified elbow, not elsewhere classified: Secondary | ICD-10-CM | POA: Insufficient documentation

## 2011-06-10 DIAGNOSIS — M6281 Muscle weakness (generalized): Secondary | ICD-10-CM | POA: Insufficient documentation

## 2011-06-10 DIAGNOSIS — M25529 Pain in unspecified elbow: Secondary | ICD-10-CM | POA: Insufficient documentation

## 2011-06-10 DIAGNOSIS — IMO0001 Reserved for inherently not codable concepts without codable children: Secondary | ICD-10-CM | POA: Insufficient documentation

## 2011-06-10 DIAGNOSIS — S4350XA Sprain of unspecified acromioclavicular joint, initial encounter: Secondary | ICD-10-CM | POA: Insufficient documentation

## 2011-06-10 NOTE — Progress Notes (Signed)
Occupational Therapy Evaluation  Patient Name: [name] Date: 06/10/2011 Time In 1025 Time Out 1105  OT Evaluation 1025-1040  Manual Therapy 1041-1105 (no charge)  Visit 1/4  Reassessment date: 07/08/11 Medicaid Authorized from 06/10/11-07/08/11 Medicaid visit # 0 of 3 (start counting at next visit)  DIAGNOSIS:  Right Lateral Epicondylitis    S:  I want to get back to doing my daily activities without pain.  History:  [name] had a ganglion cyst removed from her right thumb in January.  Since that time, she has experienced pain radiating into her right elbow.  She has had 2 cortisone injections in her right lateral epicondyle, which haven't alleviated her pain.  She has been referred by Dr. Romeo Apple to occupational therapy for evaluation and treatment. Pain Assessment Currently in Pain?: Yes Pain Score:   7 Pain Location: Elbow Pain Orientation: Right;Lateral Pain Type: Acute pain      Please see scanned Medicaid Eval  Assessment Cognition Overall Cognitive Status: Appears within functional limits for tasks assessed Sensation Light Touch: Appears Intact Stereognosis: Appears Intact Hot/Cold: Appears Intact Proprioception: Appears Intact RUE AROM (degrees) Right Elbow Flexion/Extension 0-135-150: 144  Right Forearm Pronation  0-80-90: 85 Degrees Right Forearm Supination  0-80-90: 74 Degrees Right Wrist Extension 0-70: 50 Degrees Right Wrist Flexion 0-80: 80 Degrees Right Wrist Radial Deviation 0-20: 28 Degrees Right Wrist Ulnar Deviation 0-30: 30 Degrees Right Thumb Opposition:  (MCPJ 60 IPJ 84) RUE Strength Right Elbow Flexion: 5/5 Right Elbow Extension: 5/5 Right Forearm Pronation: 5/5 Right Forearm Supination: 5/5 Right Wrist Flexion: 5/5 Right Wrist Extension: 5/5 Right Wrist Radial Deviation: 5/5 Right Wrist Ulnar Deviation: 5/5 Gross Grasp:  (grip flex/ext:  r:  75/58 l:  91/75  lat/3pt r: 18/10 l: 18)       Exercise/Treatments    Manual  Therapy Manual Therapy: Other (comment) Myofascial Release: MFR and manual stretching to elbow, flexor and extensor forearm, hand, and thumb to decrease fascial restrictions and pain. Other Manual Therapy: Scar Release:  to surgical incision on right dorsal webspace.     Goals Short Term Goals (2 weeks) Short Term Goal 1: Patient will be Independent with HEP. Short Term Goal 2: Patient will decrease pain to 5/10 with activity. Short Term Goal 3: Patient will increase right upper extremity AROM to [facility] for increased independence with ADLs. Short Term Goal 4: Patient will increase right grip strength by 8 pounds and pinch strength by 2 pounds for increased independence with fixing her hair. Short Term Goal 5: Patient will decrease fascial restrictions to minimal in right upper extremity. Long Term Goals (4 weeks) Long Term Goal 1: Patient will return to prior level of independence with ADLs. Long Term Goal 2: Patient will decrease pain in her right upper extremity to 2/10 during daily activities. Long Term Goal 3: Patient will decrease fascial restrictions to trace in her right arm. Long Term Goal 4: Patient will increase right grip strength by 16 pounds and pinch strength by 4 pounds for increased independence spraying hairspray. End of Session Patient Active Problem List  Diagnoses  . NEOPLASMS UNSPEC NATURE BONE SOFT TISSUE&SKIN  . CELLULITIS, HAND  . DEQUERVAIN'S  . Tennis elbow  . Acromioclavicular (joint) (ligament) sprain  . Pain in joint, upper arm  . Muscle weakness (generalized)        A:  Please see Medicaid Eval P:  MFR and manual stretching to RUE.  Therapeutic Ex:  UBE, wrist flexor and extensor stretches, green tputty  flat roll grip, sponges, hand gripper. Shirlean Mylar, OTR/L  06/10/2011, 3:05 PM

## 2011-06-17 ENCOUNTER — Ambulatory Visit (HOSPITAL_COMMUNITY)
Admission: RE | Admit: 2011-06-17 | Discharge: 2011-06-17 | Disposition: A | Discharge status: Home / Self Care | Payer: Medicaid Other | Source: Ambulatory Visit | Attending: Specialist | Admitting: Specialist

## 2011-06-17 DIAGNOSIS — M25529 Pain in unspecified elbow: Secondary | ICD-10-CM

## 2011-06-17 DIAGNOSIS — M6281 Muscle weakness (generalized): Secondary | ICD-10-CM

## 2011-06-17 NOTE — Progress Notes (Signed)
Occupational Therapy Treatment  Patient Name: [name] MRN: [id number] Today's Date: 06/17/2011 Time In 1017 Time Out 1113  Manual Therapy 4098-1191 Therapeutic Exercises 1047- 1113 Visit 2/4  Reassessment date: 07/08/11  Medicaid Authorized from 06/10/11-07/08/11  Medicaid visit # 1 of 3 (start counting at next visit)  DIAGNOSIS: Right Lateral Epicondylitis  S:  Ive been doing my exercises everyday and I think it is helping. Pain Assessment Currently in Pain?: Yes Pain Score:   7 Pain Location: Elbow Pain Orientation: Right;Lateral   Exercise/Treatments Wall Pushups/Modified Pushups: 10 Sponges: 29, 31, 35 Theraputty: Flatten;Roll;Grip Theraputty - Flatten: green Theraputty - Roll: green Theraputty - Grip: green Hand Gripper with Large Beads: 10 Weighted Stretch Over Towel Roll Wrist Flexion - Weighted Stretch: 60 seconds (1 pound) Wrist Extension - Weighted Stretch: 60 seconds (1 pound) UBE 3 min in reverse 1.0 Manual Therapy Manual Therapy: Other (comment) Myofascial Release: MFR and manual stretching to right elbow, flexor and extensor forearm, hand, and thumb to decrease fascial restrictions and pain. Other Manual Therapy: Scar Release:  to surgical incision on right dorsal webspace.   Goals Short Term Goals Short Term Goal 1: Patient will be Independent with HEP. Short Term Goal 1 Progress: Progressing toward goal Short Term Goal 2: Patient will decrease pain to 5/10 with activity. Short Term Goal 2 Progress: Progressing toward goal Short Term Goal 3: Patient will increase right upper extremity AROM to [facility] for increased independence with ADLs. Short Term Goal 3 Progress: Progressing toward goal Short Term Goal 4: Patient will increase right grip strength by 8 pounds and pinch strength by 2 pounds for increased independence with fixing her hair. Short Term Goal 4 Progress: Progressing toward goal Short Term Goal 5: Patient will decrease fascial restrictions to  minimal in right upper extremity. Short Term Goal 5 Progress: Progressing toward goal Long Term Goals Long Term Goal 1: Patient will return to prior level of independence with ADLs. Long Term Goal 2: Patient will decrease pain in her right upper extremity to 2/10 during daily activities. Long Term Goal 3: Patient will decrease fascial restrictions to trace in her right arm. Long Term Goal 4: Patient will increase right grip strength by 16 pounds and pinch strength by 4 pounds for increased independence spraying hairspray. End of Session Patient Active Problem List  Diagnoses  . NEOPLASMS UNSPEC NATURE BONE SOFT TISSUE&SKIN  . CELLULITIS, HAND  . DEQUERVAIN'S  . Tennis elbow  . Acromioclavicular (joint) (ligament) sprain  . Pain in joint, upper arm  . Muscle weakness (generalized)   End of Session Activity Tolerance: Patient tolerated treatment well General Behavior During Session: [facility] for tasks performed OT Assessment and Plan Clinical Impression Statement: A:  Good vasomotor response to MFR and deep tissue massage in right lateral elbow. OT Plan: P:  Increase to 2 pounds with weighted stretch for wrist flexion and extension.   Shirlean Mylar, OTR/L  06/17/2011, 11:52 AM

## 2011-06-24 ENCOUNTER — Ambulatory Visit (HOSPITAL_COMMUNITY)
Admission: RE | Admit: 2011-06-24 | Discharge: 2011-06-24 | Disposition: A | Discharge status: Home / Self Care | Payer: Medicaid Other | Source: Ambulatory Visit | Attending: Occupational Therapy | Admitting: Occupational Therapy

## 2011-06-24 DIAGNOSIS — M6281 Muscle weakness (generalized): Secondary | ICD-10-CM

## 2011-06-24 DIAGNOSIS — M25529 Pain in unspecified elbow: Secondary | ICD-10-CM

## 2011-06-24 NOTE — Progress Notes (Signed)
Occupational Therapy Treatment  Patient Name: [name] MRN: [id number] Today's Date: 06/24/2011   Time In 1020 Time Out 1105  Manual Therapy 1020-1047  Therapeutic Exercises 1047- 1105  Visit 3/4  Reassessment date: 07/08/11  Medicaid Authorized from 06/10/11-07/08/11  Medicaid visit # 2 of 3)  DIAGNOSIS: Right Lateral Epicondylitis   HPI: Symptoms/Limitations Symptoms: a little better Pain Assessment Currently in Pain?: Yes Pain Score:   5 Pain Location: Elbow Pain Orientation: Right Pain Type: Acute pain    Exercise/Treatments Additional Elbow Exercises UBE (Upper Arm Bike): 3 min in reverse 1.0 Wall Pushups/Modified Pushups: 12 Sponges: 40,46,47 Theraputty: Flatten;Roll;Grip Theraputty - Flatten: green Theraputty - Roll: green Theraputty - Grip: green Hand Gripper with Large Beads: 12 Weighted Stretch Over Towel Roll Wrist Flexion - Weighted Stretch:  (d/c, patient no longer feels stretch)  Manual Therapy Manual Therapy: Myofascial release Myofascial Release: MFR and manual stretching to elbow, flexor and extensor forearm, hand, and thumb to decrease fascial restrictions and pain.   Goals Short Term Goals Short Term Goal 1: Patient will be Independent with HEP. Short Term Goal 1 Progress: Met Short Term Goal 2: Patient will decrease pain to 5/10 with activity. Short Term Goal 2 Progress: Progressing toward goal Short Term Goal 3: Patient will increase right upper extremity AROM to [facility] for increased independence with ADLs. Short Term Goal 3 Progress: Progressing toward goal Short Term Goal 4: Patient will increase right grip strength by 8 pounds and pinch strength by 2 pounds for increased independence with fixing her hair. Short Term Goal 4 Progress: Progressing toward goal Short Term Goal 5: Patient will decrease fascial restrictions to minimal in right upper extremity. Short Term Goal 5 Progress: Progressing toward goal Long Term Goals Long Term  Goal 1: Patient will return to prior level of independence with ADLs. Long Term Goal 1 Progress: Progressing toward goal Long Term Goal 2: Patient will decrease pain in her right upper extremity to 2/10 during daily activities. Long Term Goal 2 Progress: Progressing toward goal Long Term Goal 3: Patient will decrease fascial restrictions to trace in her right arm. Long Term Goal 3 Progress: Progressing toward goal Long Term Goal 4: Patient will increase right grip strength by 16 pounds and pinch strength by 4 pounds for increased independence spraying hairspray. Long Term Goal 4 Progress: Progressing toward goal End of Session Patient Active Problem List  Diagnoses  . NEOPLASMS UNSPEC NATURE BONE SOFT TISSUE&SKIN  . CELLULITIS, HAND  . DEQUERVAIN'S  . Tennis elbow  . Acromioclavicular (joint) (ligament) sprain  . Pain in joint, upper arm  . Muscle weakness (generalized)   End of Session Activity Tolerance: Patient tolerated treatment well OT Assessment and Plan Clinical Impression Statement: increased the number of sponges she can grip and increased ease with hand gripper.  Still very tender to touch but good temp change to skin after manuel.  Patient states she hurts when she leaves but always feels better than  before she came with in a few hours. Prognosis: Good OT Plan: attempt medium beads.   Dresden L. Grover, COTA/L  06/24/2011, 11:30 AM

## 2011-07-01 ENCOUNTER — Telehealth (HOSPITAL_COMMUNITY): Payer: Self-pay | Admitting: Occupational Therapy

## 2011-07-01 ENCOUNTER — Inpatient Hospital Stay (HOSPITAL_COMMUNITY): Admission: RE | Admit: 2011-07-01 | Payer: Medicaid Other | Source: Ambulatory Visit | Admitting: Occupational Therapy

## 2011-07-02 ENCOUNTER — Ambulatory Visit (INDEPENDENT_AMBULATORY_CARE_PROVIDER_SITE_OTHER): Payer: Medicaid Other | Admitting: Orthopedic Surgery

## 2011-07-02 DIAGNOSIS — M7710 Lateral epicondylitis, unspecified elbow: Secondary | ICD-10-CM

## 2011-07-02 DIAGNOSIS — M25529 Pain in unspecified elbow: Secondary | ICD-10-CM

## 2011-07-02 MED ORDER — IBUPROFEN 800 MG PO TABS: 800.0000 mg | ORAL_TABLET | Freq: Three times a day (TID) | ORAL | Status: DC | PRN

## 2011-07-02 NOTE — Progress Notes (Signed)
Chief complaint: right elbow pain  HPI:(1) h/o tennis elbow  Improved   ROS:(1) normal      Physical Exam(6) GENERAL: normal development   CDV: pulses are normal   Skin: normal  Psychiatric: awake, alert and oriented  Neuro: normal sensation  MSK RIGHT elbow  There is full range of motion.  No tenderness.  No elbow instability.  Normal strength.   Imaging: n/a   Assessment:   Resolved tennis elbow RIGHT upper extremity    Plan: continue ibuprofen where a tennis elbow brace as needed

## 2011-07-02 NOTE — Patient Instructions (Signed)
Wear brace for activity  ?

## 2011-07-08 ENCOUNTER — Ambulatory Visit (HOSPITAL_COMMUNITY): Payer: Medicaid Other | Admitting: Occupational Therapy

## 2011-07-15 ENCOUNTER — Ambulatory Visit (HOSPITAL_COMMUNITY): Payer: Medicaid Other | Admitting: Specialist

## 2011-07-22 ENCOUNTER — Ambulatory Visit (HOSPITAL_COMMUNITY): Payer: Medicaid Other | Admitting: Occupational Therapy

## 2012-11-29 ENCOUNTER — Ambulatory Visit: Payer: Medicaid Other | Admitting: Orthopedic Surgery

## 2012-12-21 ENCOUNTER — Ambulatory Visit (INDEPENDENT_AMBULATORY_CARE_PROVIDER_SITE_OTHER): Payer: Medicaid Other

## 2012-12-21 ENCOUNTER — Ambulatory Visit (INDEPENDENT_AMBULATORY_CARE_PROVIDER_SITE_OTHER): Payer: Medicaid Other | Admitting: Orthopedic Surgery

## 2012-12-21 ENCOUNTER — Encounter: Payer: Self-pay | Admitting: Orthopedic Surgery

## 2012-12-21 VITALS — BP 130/90 | Ht 62.0 in | Wt 192.0 lb

## 2012-12-21 DIAGNOSIS — M25819 Other specified joint disorders, unspecified shoulder: Secondary | ICD-10-CM

## 2012-12-21 DIAGNOSIS — M25519 Pain in unspecified shoulder: Secondary | ICD-10-CM

## 2012-12-21 DIAGNOSIS — M7542 Impingement syndrome of left shoulder: Secondary | ICD-10-CM

## 2012-12-21 DIAGNOSIS — M25512 Pain in left shoulder: Secondary | ICD-10-CM

## 2012-12-21 DIAGNOSIS — M7540 Impingement syndrome of unspecified shoulder: Secondary | ICD-10-CM | POA: Insufficient documentation

## 2012-12-21 NOTE — Progress Notes (Signed)
Patient ID: [name], female   DOB: [birth year], 32 y.o.   MRN: [id number] Chief Complaint  Patient presents with  . Shoulder Pain    Referral from Dr. Sherwood Gambler for left shoulder pain, no injury.    History the patient reports severe pain in the left shoulder with sudden onset about one month ago when she was exercising. She had sharp throbbing stabbing pain with pulling across the front of the shoulder and a.c. joint. Pain at that time was 9/10 constant now since to be intermittent and worse with movement and forward elevation. A certain catching is noted with some swelling and a feeling of tingling in the arm. She received a an IM shot of cortisone in her hip she was placed in a sling or shoulder has improved she still has some pain at terminal motion with flexion  Review of systems heartburn seasonal allergies and neurologic and musculoskeletal findings as noted  The past history family history and social history have been recorded and reviewed  BP 130/90  Ht 5\' 2"  (1.575 m)  Wt 192 lb (87.091 kg)  BMI 35.11 kg/m2 Detailed exam general appearance normal orientation x3 normal mood and affect normal gait and station normal. Palpation reveals tenderness around the glenohumeral joint posterior joint line and lateral deltoid. Range of motion is full with pain at 180 of forward elevation. Stability tests normal strength normal skin normal multiple tattoos noted pulse normal. Right arm no abnormalities on range of motion stability and strength tests with skin intact  Medical decision making x-ray was ordered and my interpretation is that she has normal shoulder with a type I acromion  Diagnosis is impingement syndrome new problem no further workup planned  Risk we gave her a subacromial injection  Subacromial Shoulder Injection Procedure Note  Pre-operative Diagnosis: left RC Syndrome  Post-operative Diagnosis: same  Indications: pain   Anesthesia: ethyl chloride   Procedure Details    Verbal consent was obtained for the procedure. The shoulder was prepped withalcohol and the skin was anesthetized. A 20 gauge needle was advanced into the subacromial space through posterior approach without difficulty  The space was then injected with 3 ml 1% lidocaine and 1 ml of depomedrol. The injection site was cleansed with isopropyl alcohol and a dressing was applied.  Complications:  None; patient tolerated the procedure well.

## 2012-12-21 NOTE — Patient Instructions (Addendum)
You have received a steroid shot. 15% of patients experience increased pain at the injection site with in the next 24 hours. This is best treated with ice and tylenol extra strength 2 tabs every 8 hours. If you are still having pain please call the office.   Bursitis Bursitis is a swelling and soreness (inflammation) of a fluid-filled sac (bursa) that overlies and protects a joint. It can be caused by injury, overuse of the joint, arthritis or infection. The joints most likely to be affected are the elbows, shoulders, hips and knees. HOME CARE INSTRUCTIONS   Apply ice to the affected area for 15 to 20 minutes each hour while awake for 2 days. Put the ice in a plastic bag and place a towel between the bag of ice and your skin.  Rest the injured joint as much as possible, but continue to put the joint through a full range of motion, 4 times per day. (The shoulder joint especially becomes rapidly "frozen" if not used.) When the pain lessens, begin normal slow movements and usual activities.  Only take over-the-counter or prescription medicines for pain, discomfort or fever as directed by your caregiver.  Your caregiver may recommend draining the bursa and injecting medicine into the bursa. This may help the healing process.  Follow all instructions for follow-up with your caregiver. This includes any orthopedic referrals, physical therapy and rehabilitation. Any delay in obtaining necessary care could result in a delay or failure of the bursitis to heal and chronic pain. SEEK IMMEDIATE MEDICAL CARE IF:   Your pain increases even during treatment.  You develop an oral temperature above 102 F (38.9 C) and have heat and inflammation over the involved bursa. MAKE SURE YOU:   Understand these instructions.  Will watch your condition.  Will get help right away if you are not doing well or get worse. Document Released: 10/09/2000 Document Revised: 01/04/2012 Document Reviewed:  09/13/2009 [facility] Patient Information 2013 [place], [place].

## 2014-01-23 ENCOUNTER — Emergency Department (HOSPITAL_COMMUNITY)
Admission: EM | Admit: 2014-01-23 | Discharge: 2014-01-23 | Disposition: A | Discharge status: Home / Self Care | Payer: Medicaid Other | Attending: Emergency Medicine | Admitting: Emergency Medicine

## 2014-01-23 ENCOUNTER — Encounter (HOSPITAL_COMMUNITY): Payer: Self-pay | Admitting: Emergency Medicine

## 2014-01-23 DIAGNOSIS — Z79899 Other long term (current) drug therapy: Secondary | ICD-10-CM | POA: Insufficient documentation

## 2014-01-23 DIAGNOSIS — K2990 Gastroduodenitis, unspecified, without bleeding: Principal | ICD-10-CM

## 2014-01-23 DIAGNOSIS — E876 Hypokalemia: Secondary | ICD-10-CM | POA: Insufficient documentation

## 2014-01-23 DIAGNOSIS — K219 Gastro-esophageal reflux disease without esophagitis: Secondary | ICD-10-CM | POA: Insufficient documentation

## 2014-01-23 DIAGNOSIS — D72829 Elevated white blood cell count, unspecified: Secondary | ICD-10-CM | POA: Insufficient documentation

## 2014-01-23 DIAGNOSIS — R509 Fever, unspecified: Secondary | ICD-10-CM | POA: Insufficient documentation

## 2014-01-23 DIAGNOSIS — R51 Headache: Secondary | ICD-10-CM | POA: Insufficient documentation

## 2014-01-23 DIAGNOSIS — F411 Generalized anxiety disorder: Secondary | ICD-10-CM | POA: Insufficient documentation

## 2014-01-23 DIAGNOSIS — Z9089 Acquired absence of other organs: Secondary | ICD-10-CM | POA: Insufficient documentation

## 2014-01-23 DIAGNOSIS — Z9889 Other specified postprocedural states: Secondary | ICD-10-CM | POA: Insufficient documentation

## 2014-01-23 DIAGNOSIS — K2970 Gastritis, unspecified, without bleeding: Secondary | ICD-10-CM

## 2014-01-23 DIAGNOSIS — Z90710 Acquired absence of both cervix and uterus: Secondary | ICD-10-CM | POA: Insufficient documentation

## 2014-01-23 HISTORY — DX: Gastro-esophageal reflux disease without esophagitis: K21.9

## 2014-01-23 LAB — URINALYSIS, ROUTINE W REFLEX MICROSCOPIC
GLUCOSE, UA: NEGATIVE mg/dL
KETONES UR: 15 mg/dL — AB
Leukocytes, UA: NEGATIVE
Nitrite: NEGATIVE
Specific Gravity, Urine: >1.03 — ABNORMAL HIGH (ref 1.005–1.030)
Urobilinogen, UA: 0.2 mg/dL (ref 0.0–1.0)
pH: 6 (ref 5.0–8.0)

## 2014-01-23 LAB — COMPREHENSIVE METABOLIC PANEL
ALBUMIN: 4.4 g/dL (ref 3.5–5.2)
ALK PHOS: 45 U/L (ref 39–117)
ALT: 15 U/L (ref 0–35)
AST: 19 U/L (ref 0–37)
BILIRUBIN TOTAL: 0.4 mg/dL (ref 0.3–1.2)
BUN: 9 mg/dL (ref 6–23)
CHLORIDE: 99 meq/L (ref 96–112)
CO2: 28 mEq/L (ref 19–32)
Calcium: 9.5 mg/dL (ref 8.4–10.5)
Creatinine, Ser: 0.76 mg/dL (ref 0.50–1.10)
GFR calc Af Amer: >90 mL/min (ref >90)
GFR calc non Af Amer: >90 mL/min (ref >90)
Glucose, Bld: 103 mg/dL — ABNORMAL HIGH (ref 70–99)
POTASSIUM: 3 meq/L — AB (ref 3.7–5.3)
Sodium: 141 mEq/L (ref 137–147)
Total Protein: 8.1 g/dL (ref 6.0–8.3)

## 2014-01-23 LAB — CBC WITH DIFFERENTIAL/PLATELET
BASOS ABS: 0 10*3/uL (ref 0.0–0.1)
BASOS PCT: 0 % (ref 0–1)
Eosinophils Absolute: 0 10*3/uL (ref 0.0–0.7)
Eosinophils Relative: 0 % (ref 0–5)
HCT: 39.4 % (ref 36.0–46.0)
HEMOGLOBIN: 13.4 g/dL (ref 12.0–15.0)
LYMPHS PCT: 17 % (ref 12–46)
Lymphs Abs: 1.8 10*3/uL (ref 0.7–4.0)
MCH: 32 pg (ref 26.0–34.0)
MCHC: 34 g/dL (ref 30.0–36.0)
MCV: 94 fL (ref 78.0–100.0)
MONOS PCT: 10 % (ref 3–12)
Monocytes Absolute: 1.1 10*3/uL — ABNORMAL HIGH (ref 0.1–1.0)
NEUTROS ABS: 8 10*3/uL — AB (ref 1.7–7.7)
NEUTROS PCT: 73 % (ref 43–77)
Platelets: 281 10*3/uL (ref 150–400)
RBC: 4.19 MIL/uL (ref 3.87–5.11)
RDW: 12.3 % (ref 11.5–15.5)
WBC: 10.9 10*3/uL — AB (ref 4.0–10.5)

## 2014-01-23 LAB — URINE MICROSCOPIC-ADD ON

## 2014-01-23 MED ORDER — PROMETHAZINE HCL 25 MG/ML IJ SOLN
12.5000 mg | Freq: Once | INTRAMUSCULAR | Status: AC
  Administered 2014-01-23: 12.5 mg via INTRAVENOUS
  Filled 2014-01-23: qty 1

## 2014-01-23 MED ORDER — ONDANSETRON HCL 4 MG/2ML IJ SOLN
4.0000 mg | Freq: Once | INTRAMUSCULAR | Status: AC
  Administered 2014-01-23: 4 mg via INTRAVENOUS
  Filled 2014-01-23: qty 2

## 2014-01-23 MED ORDER — PROMETHAZINE HCL 25 MG RE SUPP: 25.0000 mg | Freq: Four times a day (QID) | RECTAL | Status: DC | PRN

## 2014-01-23 MED ORDER — RANITIDINE HCL 150 MG PO TABS: 150.0000 mg | ORAL_TABLET | Freq: Two times a day (BID) | ORAL | Status: DC

## 2014-01-23 MED ORDER — POTASSIUM CHLORIDE CRYS ER 20 MEQ PO TBCR
40.0000 meq | EXTENDED_RELEASE_TABLET | Freq: Once | ORAL | Status: DC
  Filled 2014-01-23: qty 2

## 2014-01-23 MED ORDER — SODIUM CHLORIDE 0.9 % IV SOLN
1000.0000 mL | INTRAVENOUS | Status: DC
  Administered 2014-01-23: 1000 mL via INTRAVENOUS

## 2014-01-23 MED ORDER — HYDROCODONE-ACETAMINOPHEN 7.5-325 MG PO TABS: 1.0000 | ORAL_TABLET | ORAL | Status: DC | PRN

## 2014-01-23 MED ORDER — POTASSIUM CHLORIDE 10 MEQ/100ML IV SOLN
10.0000 meq | Freq: Once | INTRAVENOUS | Status: AC
  Administered 2014-01-23: 10 meq via INTRAVENOUS
  Filled 2014-01-23: qty 100

## 2014-01-23 MED ORDER — MORPHINE SULFATE 4 MG/ML IJ SOLN
4.0000 mg | Freq: Once | INTRAMUSCULAR | Status: AC
  Administered 2014-01-23: 4 mg via INTRAVENOUS
  Filled 2014-01-23: qty 1

## 2014-01-23 MED ORDER — PANTOPRAZOLE SODIUM 20 MG PO TBEC: 20.0000 mg | DELAYED_RELEASE_TABLET | Freq: Every day | ORAL | Status: DC

## 2014-01-23 MED ORDER — FAMOTIDINE IN NACL 20-0.9 MG/50ML-% IV SOLN
20.0000 mg | Freq: Once | INTRAVENOUS | Status: AC
  Administered 2014-01-23: 20 mg via INTRAVENOUS
  Filled 2014-01-23: qty 50

## 2014-01-23 MED ORDER — POTASSIUM CHLORIDE 10 MEQ/100ML IV SOLN
INTRAVENOUS | Status: AC
  Administered 2014-01-23: 10 meq via INTRAVENOUS
  Filled 2014-01-23: qty 100

## 2014-01-23 MED ORDER — ONDANSETRON HCL 4 MG/2ML IJ SOLN
4.0000 mg | Freq: Once | INTRAMUSCULAR | Status: AC
  Administered 2014-01-23: 4 mg via INTRAVENOUS

## 2014-01-23 MED ORDER — FENTANYL CITRATE 0.05 MG/ML IJ SOLN
50.0000 ug | Freq: Once | INTRAMUSCULAR | Status: AC
  Administered 2014-01-23: 50 ug via INTRAVENOUS
  Filled 2014-01-23: qty 2

## 2014-01-23 MED ORDER — ONDANSETRON HCL 4 MG/2ML IJ SOLN
4.0000 mg | Freq: Once | INTRAMUSCULAR | Status: DC
  Filled 2014-01-23: qty 2

## 2014-01-23 MED ORDER — SODIUM CHLORIDE 0.9 % IV SOLN
1000.0000 mL | Freq: Once | INTRAVENOUS | Status: AC
  Administered 2014-01-23: 1000 mL via INTRAVENOUS

## 2014-01-23 NOTE — ED Provider Notes (Signed)
CSN: [id number]     Arrival date & time 01/23/14  5427 History   First MD Initiated Contact with Patient 01/23/14 [phone]     Chief Complaint  Patient presents with  . Emesis     (Consider location/radiation/quality/duration/timing/severity/associated sxs/prior Treatment) Patient is a 33 y.o. female presenting with vomiting. The history is provided by the patient.  Emesis Severity:  Moderate Duration:  3 days Timing:  Intermittent Number of daily episodes:  `8 Quality:  Stomach contents Progression:  Worsening Chronicity:  Recurrent (history of similar episode 2 years ago) Relieved by:  Nothing Ineffective treatments:  None tried Associated symptoms: abdominal pain, headaches and myalgias   Associated symptoms: no arthralgias and no fever   Risk factors: no alcohol use, no diabetes, no sick contacts and no travel to endemic areas     Past Medical History  Diagnosis Date  . Anxiety   . Acid reflux    Past Surgical History  Procedure Laterality Date  . Brain surgery    . Ectopic pregnancy surgery    . Cholecystectomy    . Abdominal hysterectomy    . Cesarean section    . Arm surgery    . Leg surgery     Family History  Problem Relation Age of Onset  . Arthritis    . Cancer    . Diabetes     History  Substance Use Topics  . Smoking status: Never Smoker   . Smokeless tobacco: Not on file  . Alcohol Use: No   OB History   Grav Para Term Preterm Abortions TAB SAB Ect Mult Living                 Review of Systems  Constitutional: Negative for activity change.       All ROS Neg except as noted in HPI  HENT: Negative for nosebleeds.   Eyes: Negative for photophobia and discharge.  Respiratory: Negative for cough, shortness of breath and wheezing.   Cardiovascular: Negative for chest pain and palpitations.  Gastrointestinal: Positive for vomiting and abdominal pain. Negative for blood in stool.  Genitourinary: Negative for dysuria, frequency and hematuria.   Musculoskeletal: Positive for myalgias. Negative for arthralgias, back pain and neck pain.  Skin: Negative.   Neurological: Positive for headaches. Negative for dizziness, seizures and speech difficulty.  Psychiatric/Behavioral: Negative for hallucinations and confusion.      Allergies  Demerol  Home Medications   Current Outpatient Rx  Name  Route  Sig  Dispense  Refill  . alprazolam (XANAX) 2 MG tablet   Oral   Take 2 mg by mouth at bedtime as needed.           Marland Kitchen HYDROcodone-acetaminophen (LORCET) 10-650 MG per tablet   Oral   Take 1 tablet by mouth every 6 (six) hours as needed.           Marland Kitchen ibuprofen (ADVIL,MOTRIN) 800 MG tablet   Oral   Take 1 tablet (800 mg total) by mouth every 8 (eight) hours as needed.   90 tablet   1   . Omeprazole (PRILOSEC PO)   Oral   Take by mouth.            BP 125/82  Pulse 77  Temp(Src) 97.5 F (36.4 C) (Oral)  Resp 16  SpO2 99% Physical Exam  Nursing note and vitals reviewed. Constitutional: She is oriented to person, place, and time. She appears well-developed and well-nourished.  Non-toxic appearance.  HENT:  Head: Normocephalic.  Right Ear: Tympanic membrane and external ear normal.  Left Ear: Tympanic membrane and external ear normal.  Eyes: EOM and lids are normal. Pupils are equal, round, and reactive to light.  Neck: Normal range of motion. Neck supple. Carotid bruit is not present.  Cardiovascular: Normal rate, regular rhythm, normal heart sounds, intact distal pulses and normal pulses.   Pulmonary/Chest: Breath sounds normal. No respiratory distress.  Abdominal: Soft. Bowel sounds are normal. There is no tenderness. There is no guarding.  Diffuse abd soreness. No organomegaly. Bowel sounds present. No rebound. No CVAT.  Musculoskeletal: Normal range of motion.  Lymphadenopathy:       Head (right side): No submandibular adenopathy present.       Head (left side): No submandibular adenopathy present.    She has  no cervical adenopathy.  Neurological: She is alert and oriented to person, place, and time. She has normal strength. No cranial nerve deficit or sensory deficit.  Skin: Skin is warm and dry.  Psychiatric: She has a normal mood and affect. Her speech is normal.    ED Course  Procedures (including critical care time) Labs Review Labs Reviewed - No data to display Imaging Review No results found.   EKG Interpretation None      MDM Patient has history of gastritis, nausea vomiting. She has not had any new medications. She does not use alcohol. She has not been passing any blood in stool or vomitus. She's not had high fevers.  IV fluids, IV Pepcid, and IV Zofran given to the patient.  Urinalysis reveals a clear yellow specimen with a specific gravity greater than 1.030. There is a small amount of hemoglobin, there is 50 mg per decaliter of ketones and a trace of protein present. Under the microscopic examination in 3-6 red cells and a few squamous cells. Otherwise within normal limits. Complete blood count shows a slight elevation of the white blood cells of 10,900, the hemoglobin is 13.4 the hematocrit is 39.4 the platelets are 281,000. The conference of metabolic panel shows potassium to be low at 3.0 the remainder of the conference of metabolic panel is well within normal limits.  The patient had another episode of vomiting even after receiving the Zofran. Patient will receive another bolus of IV fluids, she will also receive IV promethazine. The patient is requesting medication for her pain and IV fentanyl will be given. Patient is also given 10 mL equivalents of potassium intravenously.  No vomiting noted after IV promethazine. Pain improved but not resolved after the IV fentanyl. Patient tolerated IV fluids without problem. Patient tolerated IV potassium without problem.  At discharge the patient has a low-grade temperature of 99.3 otherwise vital signs are well within normal  limits.  Suspect the patient has an exacerbation of gastritis that she has been diagnosed within the past. Prescription for Protonix and Zantac given to the patient. Prescription for promethazine and Norco also given to the patient. The patient is advised to see her primary physician for GI referral, or to use GI specialist on call, Dr. Oneida Alar. Patient advised to return to the emergency department if any changes, high fevers, hematemesis, intractable pain, or deterioration in condition.    Final diagnoses:  None    **I have reviewed nursing notes, vital signs, and all appropriate lab and imaging results for this patient.Lenox Ahr, PA-C 01/23/14 1317

## 2014-01-23 NOTE — ED Notes (Signed)
Pt c/o abd pain and back pain since Saturday.  Reports started vomiting Sunday.  LBM was Sunday.  Denies diarrhea.

## 2014-01-23 NOTE — Discharge Instructions (Signed)
Please see Dr.Fusco for GI referral, or see the GI specialist listed above for completion of your workup and management of your pain and nausea. Please use promethazine suppository every 6 hours for nausea vomiting, use of Zantac and Protonix daily for the gastritis discomfort. May use Norco for pain if needed. This medication may cause drowsiness, please use with caution. Hypokalemia Hypokalemia means that the amount of potassium in the blood is lower than normal.Potassium is a chemical, called an electrolyte, that helps regulate the amount of fluid in the body. It also stimulates muscle contraction and helps nerves function properly.Most of the body's potassium is inside of cells, and only a very small amount is in the blood. Because the amount in the blood is so small, minor changes can be life-threatening. CAUSES  Antibiotics.  Diarrhea or vomiting.  Using laxatives too much, which can cause diarrhea.  Chronic kidney disease.  Water pills (diuretics).  Eating disorders (bulimia).  Low magnesium level.  Sweating a lot. SIGNS AND SYMPTOMS  Weakness.  Constipation.  Fatigue.  Muscle cramps.  Mental confusion.  Skipped heartbeats or irregular heartbeat (palpitations).  Tingling or numbness. DIAGNOSIS  Your health care provider can diagnose hypokalemia with blood tests. In addition to checking your potassium level, your health care provider may also check other lab tests. TREATMENT Hypokalemia can be treated with potassium supplements taken by mouth or adjustments in your current medicines. If your potassium level is very low, you may need to get potassium through a vein (IV) and be monitored in the hospital. A diet high in potassium is also helpful. Foods high in potassium are:  Nuts, such as peanuts and pistachios.  Seeds, such as sunflower seeds and pumpkin seeds.  Peas, lentils, and lima beans.  Whole grain and bran cereals and breads.  Fresh fruit and vegetables,  such as apricots, avocado, bananas, cantaloupe, kiwi, oranges, tomatoes, asparagus, and potatoes.  Orange and tomato juices.  Red meats.  Fruit yogurt. HOME CARE INSTRUCTIONS  Take all medicines as prescribed by your health care provider.  Maintain a healthy diet by including nutritious food, such as fruits, vegetables, nuts, whole grains, and lean meats.  If you are taking a laxative, be sure to follow the directions on the label. SEEK MEDICAL CARE IF:  Your weakness gets worse.  You feel your heart pounding or racing.  You are vomiting or having diarrhea.  You are diabetic and having trouble keeping your blood glucose in the normal range. SEEK IMMEDIATE MEDICAL CARE IF:  You have chest pain, shortness of breath, or dizziness.  You are vomiting or having diarrhea for more than 2 days.  You faint. MAKE SURE YOU:   Understand these instructions.  Will watch your condition.  Will get help right away if you are not doing well or get worse. Document Released: 10/12/2005 Document Revised: 08/02/2013 Document Reviewed: 04/14/2013 [facility] Patient Information 2014 [place].  Gastritis, Adult Gastritis is soreness and puffiness (inflammation) of the lining of the stomach. If you do not get help, gastritis can cause bleeding and sores (ulcers) in the stomach. HOME CARE   Only take medicine as told by your doctor.  If you were given antibiotic medicines, take them as told. Finish the medicines even if you start to feel better.  Drink enough fluids to keep your pee (urine) clear or pale yellow.  Avoid foods and drinks that make your problems worse. Foods you may want to avoid include:  Caffeine or alcohol.  Chocolate.  Mint.  Garlic and onions.  Spicy foods.  Citrus fruits, including oranges, lemons, or limes.  Food containing tomatoes, including sauce, chili, salsa, and pizza.  Fried and fatty foods.  Eat small meals throughout the day instead of large  meals. GET HELP RIGHT AWAY IF:   You have black or dark red poop (stools).  You throw up (vomit) blood. It may look like coffee grounds.  You cannot keep fluids down.  Your belly (abdominal) pain gets worse.  You have a fever.  You do not feel better after 1 week.  You have any other questions or concerns. MAKE SURE YOU:   Understand these instructions.  Will watch your condition.  Will get help right away if you are not doing well or get worse. Document Released: 03/30/2008 Document Revised: 01/04/2012 Document Reviewed: 11/25/2011 [facility] Patient Information 2014 [place].

## 2014-01-23 NOTE — ED Notes (Signed)
Patient requesting something for pain. EDPA made aware.

## 2014-01-24 ENCOUNTER — Encounter (HOSPITAL_COMMUNITY): Payer: Self-pay | Admitting: Emergency Medicine

## 2014-01-24 ENCOUNTER — Emergency Department (HOSPITAL_COMMUNITY)
Admission: EM | Admit: 2014-01-24 | Discharge: 2014-01-24 | Disposition: A | Discharge status: Home / Self Care | Payer: Medicaid Other | Attending: Emergency Medicine | Admitting: Emergency Medicine

## 2014-01-24 DIAGNOSIS — K219 Gastro-esophageal reflux disease without esophagitis: Secondary | ICD-10-CM | POA: Insufficient documentation

## 2014-01-24 DIAGNOSIS — R109 Unspecified abdominal pain: Secondary | ICD-10-CM

## 2014-01-24 DIAGNOSIS — R3989 Other symptoms and signs involving the genitourinary system: Secondary | ICD-10-CM | POA: Insufficient documentation

## 2014-01-24 DIAGNOSIS — F411 Generalized anxiety disorder: Secondary | ICD-10-CM | POA: Insufficient documentation

## 2014-01-24 DIAGNOSIS — K5900 Constipation, unspecified: Secondary | ICD-10-CM | POA: Insufficient documentation

## 2014-01-24 DIAGNOSIS — R112 Nausea with vomiting, unspecified: Secondary | ICD-10-CM | POA: Insufficient documentation

## 2014-01-24 DIAGNOSIS — Z90710 Acquired absence of both cervix and uterus: Secondary | ICD-10-CM | POA: Insufficient documentation

## 2014-01-24 DIAGNOSIS — Z79899 Other long term (current) drug therapy: Secondary | ICD-10-CM | POA: Insufficient documentation

## 2014-01-24 LAB — COMPREHENSIVE METABOLIC PANEL
ALBUMIN: 4.1 g/dL (ref 3.5–5.2)
ALT: 15 U/L (ref 0–35)
AST: 19 U/L (ref 0–37)
Alkaline Phosphatase: 41 U/L (ref 39–117)
BUN: 9 mg/dL (ref 6–23)
CALCIUM: 8.9 mg/dL (ref 8.4–10.5)
CO2: 27 mEq/L (ref 19–32)
CREATININE: 0.72 mg/dL (ref 0.50–1.10)
Chloride: 101 mEq/L (ref 96–112)
GFR calc Af Amer: >90 mL/min (ref >90)
GFR calc non Af Amer: >90 mL/min (ref >90)
Glucose, Bld: 110 mg/dL — ABNORMAL HIGH (ref 70–99)
Potassium: 3.5 mEq/L — ABNORMAL LOW (ref 3.7–5.3)
Sodium: 141 mEq/L (ref 137–147)
Total Bilirubin: 0.4 mg/dL (ref 0.3–1.2)
Total Protein: 7.4 g/dL (ref 6.0–8.3)

## 2014-01-24 LAB — CBC WITH DIFFERENTIAL/PLATELET
Basophils Absolute: 0 10*3/uL (ref 0.0–0.1)
Basophils Relative: 0 % (ref 0–1)
EOS ABS: 0 10*3/uL (ref 0.0–0.7)
Eosinophils Relative: 0 % (ref 0–5)
HCT: 37.8 % (ref 36.0–46.0)
HEMOGLOBIN: 12.8 g/dL (ref 12.0–15.0)
LYMPHS PCT: 19 % (ref 12–46)
Lymphs Abs: 2.1 10*3/uL (ref 0.7–4.0)
MCH: 31.8 pg (ref 26.0–34.0)
MCHC: 33.9 g/dL (ref 30.0–36.0)
MCV: 93.8 fL (ref 78.0–100.0)
MONOS PCT: 11 % (ref 3–12)
Monocytes Absolute: 1.2 10*3/uL — ABNORMAL HIGH (ref 0.1–1.0)
NEUTROS PCT: 70 % (ref 43–77)
Neutro Abs: 7.8 10*3/uL — ABNORMAL HIGH (ref 1.7–7.7)
Platelets: 229 10*3/uL (ref 150–400)
RBC: 4.03 MIL/uL (ref 3.87–5.11)
RDW: 11.7 % (ref 11.5–15.5)
WBC: 11.2 10*3/uL — AB (ref 4.0–10.5)

## 2014-01-24 LAB — I-STAT CG4 LACTIC ACID, ED: LACTIC ACID, VENOUS: 1.2 mmol/L (ref 0.5–2.2)

## 2014-01-24 LAB — LIPASE, BLOOD: LIPASE: 23 U/L (ref 11–59)

## 2014-01-24 MED ORDER — ONDANSETRON HCL 4 MG/2ML IJ SOLN
4.0000 mg | Freq: Once | INTRAMUSCULAR | Status: AC
  Administered 2014-01-24: 4 mg via INTRAVENOUS
  Filled 2014-01-24: qty 2

## 2014-01-24 MED ORDER — ONDANSETRON HCL 8 MG PO TABS: 8.0000 mg | ORAL_TABLET | Freq: Three times a day (TID) | ORAL | Status: DC | PRN

## 2014-01-24 MED ORDER — FAMOTIDINE IN NACL 20-0.9 MG/50ML-% IV SOLN
20.0000 mg | Freq: Once | INTRAVENOUS | Status: AC
  Administered 2014-01-24: 20 mg via INTRAVENOUS
  Filled 2014-01-24: qty 50

## 2014-01-24 MED ORDER — METOCLOPRAMIDE HCL 10 MG PO TABS: 10.0000 mg | ORAL_TABLET | Freq: Four times a day (QID) | ORAL | Status: DC | PRN

## 2014-01-24 MED ORDER — SODIUM CHLORIDE 0.9 % IV BOLUS (SEPSIS)
1000.0000 mL | Freq: Once | INTRAVENOUS | Status: AC
  Administered 2014-01-24: 1000 mL via INTRAVENOUS

## 2014-01-24 MED ORDER — METOCLOPRAMIDE HCL 5 MG/ML IJ SOLN
10.0000 mg | Freq: Once | INTRAMUSCULAR | Status: AC
  Administered 2014-01-24: 10 mg via INTRAVENOUS
  Filled 2014-01-24: qty 2

## 2014-01-24 MED ORDER — METOCLOPRAMIDE HCL 5 MG/ML IJ SOLN
10.0000 mg | Freq: Once | INTRAMUSCULAR | Status: AC
Start: 2014-01-24 — End: 2014-01-24
  Administered 2014-01-24: 10 mg via INTRAVENOUS
  Filled 2014-01-24: qty 2

## 2014-01-24 MED ORDER — LORAZEPAM 2 MG/ML IJ SOLN
1.0000 mg | Freq: Once | INTRAMUSCULAR | Status: AC
  Administered 2014-01-24: 1 mg via INTRAVENOUS
  Filled 2014-01-24: qty 1

## 2014-01-24 NOTE — ED Notes (Signed)
Pt states continued nausea and vomiting, unable to have BM an generalized pain to abdomen. Pt states she was seen here yesterday but unable to get meds filled because insurance would not cover them. Unable to keep anything down.

## 2014-01-24 NOTE — ED Provider Notes (Signed)
Medical screening examination/treatment/procedure(s) were performed by non-physician practitioner and as supervising physician I was immediately available for consultation/collaboration.   Kathalene Frames, MD 01/24/14 [phone]

## 2014-01-24 NOTE — ED Provider Notes (Signed)
CSN: [id number]     Arrival date & time 01/24/14  0801 History   First MD Initiated Contact with Patient 01/24/14 0818    This chart was scribed for Sharyon Cable, MD by Terressa Koyanagi, ED Scribe. This patient was seen in room APA03/APA03 and the patient's care was started at 8:21 AM.  PCP:  Glo Herring., MD  Chief Complaint  Patient presents with  . Emesis    Patient is a 33 y.o. female presenting with vomiting. The history is provided by the patient. No language interpreter was used.  Emesis Duration:  2 days Timing:  Intermittent Quality:  Bright red blood Chronicity:  Recurrent Recent urination:  Decreased Associated symptoms: abdominal pain   Associated symptoms: no diarrhea   Risk factors: prior abdominal surgery (abd hysterectomy)    HPI Comments: [name] is a 33 y.o. female, with a history of acid reflux, gastritis, nausea, vomiting and anxiety, who presents to the Emergency Department complaining of intermittent, worsening hematemesis onset two days ago. Pt complains of associated intermittent nervousness, constipation, generalized pain of the abd pain, SOB and back pain onset two days ago. Pt denies diarrhea or dysuria. Pt reports she had a similar episode 2 years ago. Pt reports she presented to, and was treated at, the ED yesterday. Pt further reports, she was prescribed meds by the ED yesterday, however, she was unable to fill her prescriptions due to coverage issues with her insurance.   Past Medical History  Diagnosis Date  . Anxiety   . Acid reflux    Past Surgical History  Procedure Laterality Date  . Brain surgery    . Ectopic pregnancy surgery    . Cholecystectomy    . Abdominal hysterectomy    . Cesarean section    . Arm surgery    . Leg surgery     Family History  Problem Relation Age of Onset  . Arthritis    . Cancer    . Diabetes     History  Substance Use Topics  . Smoking status: Never Smoker   . Smokeless tobacco: Not on file  .  Alcohol Use: No   OB History   Grav Para Term Preterm Abortions TAB SAB Ect Mult Living                 Review of Systems  Respiratory: Positive for shortness of breath.   Gastrointestinal: Positive for vomiting, abdominal pain and constipation. Negative for diarrhea.  Genitourinary: Positive for difficulty urinating. Negative for dysuria.  Psychiatric/Behavioral: The patient is nervous/anxious.   All other systems reviewed and are negative.      Allergies  Demerol  Home Medications   Current Outpatient Rx  Name  Route  Sig  Dispense  Refill  . alprazolam (XANAX) 2 MG tablet   Oral   Take 2 mg by mouth 4 (four) times daily.          Marland Kitchen amLODipine (NORVASC) 10 MG tablet   Oral   Take 10 mg by mouth daily.         Marland Kitchen HYDROcodone-acetaminophen (NORCO) 10-325 MG per tablet   Oral   Take 1 tablet by mouth 4 (four) times daily. pain         . omeprazole (PRILOSEC) 40 MG capsule   Oral   Take 40 mg by mouth 2 (two) times daily.         . pantoprazole (PROTONIX) 20 MG tablet   Oral   Take  1 tablet (20 mg total) by mouth daily.   15 tablet   0   . ranitidine (ZANTAC) 150 MG tablet   Oral   Take 1 tablet (150 mg total) by mouth 2 (two) times daily.   30 tablet   0   . HYDROcodone-acetaminophen (NORCO) 7.5-325 MG per tablet   Oral   Take 1 tablet by mouth every 4 (four) hours as needed for moderate pain.   16 tablet   0   . metoCLOPramide (REGLAN) 10 MG tablet   Oral   Take 1 tablet (10 mg total) by mouth every 6 (six) hours as needed for nausea (nausea/headache).   12 tablet   0   . ondansetron (ZOFRAN) 8 MG tablet   Oral   Take 1 tablet (8 mg total) by mouth every 8 (eight) hours as needed.   12 tablet   0    BP 139/72  Pulse 57  Temp(Src) 98.3 F (36.8 C) (Oral)  Resp 16  Ht 5\' 3"  (1.6 m)  Wt 192 lb (87.091 kg)  BMI 34.02 kg/m2  SpO2 100% Physical Exam CONSTITUTIONAL: Well developed/well nourished HEAD: Normocephalic/atraumatic EYES:  EOMI/PERRL ENMT: Mucous membranes moist NECK: supple no meningeal signs SPINE:entire spine nontender CV: S1/S2 noted, no murmurs/rubs/gallops noted LUNGS: Lungs are clear to auscultation bilaterally, no apparent distress ABDOMEN: soft, moderate epigastric tenderness, no rebound or guarding GU:no cva tenderness RECTAL: chaperone present, no blood or melena present NEURO: Pt is awake/alert, moves all extremitiesx4 EXTREMITIES: pulses normal, full ROM SKIN: warm, color normal PSYCH: no abnormalities of mood noted  ED Course  Procedures  DIAGNOSTIC STUDIES: Oxygen Saturation is 100% on RA, normal by my interpretation.    COORDINATION OF CARE:  8:34 AM-Discussed treatment plan which includes labs, meds with pt at bedside and pt agreed to plan.  9:43 AM Pt resting comfortably, feeling improved but reports nausea 11:28AM-- Recheck: Pt resting comfortably, feeling improved. Conducted another physical exam and spoke with pt regarding referral/follow-up with GI specialist, advised pt to avoid meds, such as ASA, that may aggravate symptoms. Discussed prescribing Zofran/reglan given that pt was unable to fill the prescription provided at the ED yesterday due to insurance coverage issues. Also discussed administering another dose of nausea med at the ED. Pt understands and is agreeable to plan.  BP 126/64  Pulse 76  Temp(Src) 97.9 F (36.6 C) (Oral)  Resp 12  Ht 5\' 3"  (1.6 m)  Wt 192 lb (87.091 kg)  BMI 34.02 kg/m2  SpO2 100%   Labs Review Labs Reviewed  COMPREHENSIVE METABOLIC PANEL - Abnormal; Notable for the following:    Potassium 3.5 (*)    Glucose, Bld 110 (*)    All other components within normal limits  CBC WITH DIFFERENTIAL - Abnormal; Notable for the following:    WBC 11.2 (*)    Neutro Abs 7.8 (*)    Monocytes Absolute 1.2 (*)    All other components within normal limits  LIPASE, BLOOD  OCCULT BLOOD X 1 CARD TO LAB, STOOL  I-STAT CG4 LACTIC ACID, ED     EKG  Interpretation   Date/Time:  Wednesday January 24 2014 08:46:08 EDT Ventricular Rate:  45 PR Interval:  118 QRS Duration: 92 QT Interval:  494 QTC Calculation: 427 R Axis:   74 Text Interpretation:  Sinus bradycardia Otherwise normal ECG When compared  with ECG of 23-Mar-2011 02:50, No significant change was found Confirmed  by Christy Gentles  MD, Cinderella Christoffersen ([id number]) on 01/24/2014 8:52:12 AM  MDM   Final diagnoses:  Nausea and vomiting  Abdominal pain    Nursing notes including past medical history and social history reviewed and considered in documentation Labs/vital reviewed and considered  I personally performed the services described in this documentation, which was scribed in my presence. The recorded information has been reviewed and is accurate.       Sharyon Cable, MD 01/24/14 [phone]

## 2014-01-24 NOTE — ED Notes (Addendum)
Pt ambulated to bathroom x2 with steady gait. Pt denies any dizziness, weakness with ambulation. Pt reports nausea still present but reports ginger ale " is staying down." pt has voided x2 since two normal saline boluses. No active vomiting or dry heaves noted. nad noted.

## 2014-01-24 NOTE — ED Notes (Signed)
l °

## 2014-01-24 NOTE — Discharge Instructions (Signed)

## 2014-01-24 NOTE — ED Notes (Signed)
Pt dry heaving in room. Pt reports "the ginger ale has stayed down" but reports "but my stomach feels uncomfortable."

## 2014-01-26 ENCOUNTER — Emergency Department (HOSPITAL_COMMUNITY): Payer: Medicaid Other

## 2014-01-26 ENCOUNTER — Inpatient Hospital Stay (HOSPITAL_COMMUNITY)
Admission: EM | Admit: 2014-01-26 | Discharge: 2014-01-28 | DRG: 392 | Disposition: A | Discharge status: Home / Self Care | Payer: Medicaid Other | Attending: Internal Medicine | Admitting: Internal Medicine

## 2014-01-26 ENCOUNTER — Encounter (HOSPITAL_COMMUNITY): Payer: Self-pay | Admitting: Emergency Medicine

## 2014-01-26 DIAGNOSIS — M25519 Pain in unspecified shoulder: Secondary | ICD-10-CM

## 2014-01-26 DIAGNOSIS — K219 Gastro-esophageal reflux disease without esophagitis: Secondary | ICD-10-CM | POA: Diagnosis present

## 2014-01-26 DIAGNOSIS — F411 Generalized anxiety disorder: Secondary | ICD-10-CM | POA: Diagnosis present

## 2014-01-26 DIAGNOSIS — L02519 Cutaneous abscess of unspecified hand: Secondary | ICD-10-CM

## 2014-01-26 DIAGNOSIS — Z87891 Personal history of nicotine dependence: Secondary | ICD-10-CM

## 2014-01-26 DIAGNOSIS — M25819 Other specified joint disorders, unspecified shoulder: Secondary | ICD-10-CM

## 2014-01-26 DIAGNOSIS — Z79899 Other long term (current) drug therapy: Secondary | ICD-10-CM

## 2014-01-26 DIAGNOSIS — S4350XA Sprain of unspecified acromioclavicular joint, initial encounter: Secondary | ICD-10-CM

## 2014-01-26 DIAGNOSIS — R109 Unspecified abdominal pain: Secondary | ICD-10-CM

## 2014-01-26 DIAGNOSIS — M7710 Lateral epicondylitis, unspecified elbow: Secondary | ICD-10-CM

## 2014-01-26 DIAGNOSIS — M25529 Pain in unspecified elbow: Secondary | ICD-10-CM

## 2014-01-26 DIAGNOSIS — M7580 Other shoulder lesions, unspecified shoulder: Secondary | ICD-10-CM

## 2014-01-26 DIAGNOSIS — Z833 Family history of diabetes mellitus: Secondary | ICD-10-CM

## 2014-01-26 DIAGNOSIS — L03119 Cellulitis of unspecified part of limb: Secondary | ICD-10-CM

## 2014-01-26 DIAGNOSIS — M654 Radial styloid tenosynovitis [de Quervain]: Secondary | ICD-10-CM

## 2014-01-26 DIAGNOSIS — D492 Neoplasm of unspecified behavior of bone, soft tissue, and skin: Secondary | ICD-10-CM

## 2014-01-26 DIAGNOSIS — R112 Nausea with vomiting, unspecified: Principal | ICD-10-CM

## 2014-01-26 DIAGNOSIS — M6281 Muscle weakness (generalized): Secondary | ICD-10-CM

## 2014-01-26 DIAGNOSIS — M7540 Impingement syndrome of unspecified shoulder: Secondary | ICD-10-CM

## 2014-01-26 LAB — CBC WITH DIFFERENTIAL/PLATELET
Basophils Absolute: 0 10*3/uL (ref 0.0–0.1)
Basophils Relative: 0 % (ref 0–1)
EOS ABS: 0 10*3/uL (ref 0.0–0.7)
EOS PCT: 0 % (ref 0–5)
HCT: 38.9 % (ref 36.0–46.0)
Hemoglobin: 13.6 g/dL (ref 12.0–15.0)
LYMPHS PCT: 22 % (ref 12–46)
Lymphs Abs: 2.1 10*3/uL (ref 0.7–4.0)
MCH: 32.2 pg (ref 26.0–34.0)
MCHC: 35 g/dL (ref 30.0–36.0)
MCV: 92 fL (ref 78.0–100.0)
MONOS PCT: 15 % — AB (ref 3–12)
Monocytes Absolute: 1.4 10*3/uL — ABNORMAL HIGH (ref 0.1–1.0)
Neutro Abs: 5.8 10*3/uL (ref 1.7–7.7)
Neutrophils Relative %: 63 % (ref 43–77)
PLATELETS: 227 10*3/uL (ref 150–400)
RBC: 4.23 MIL/uL (ref 3.87–5.11)
RDW: 11.8 % (ref 11.5–15.5)
WBC: 9.3 10*3/uL (ref 4.0–10.5)

## 2014-01-26 LAB — COMPREHENSIVE METABOLIC PANEL
ALT: 21 U/L (ref 0–35)
AST: 18 U/L (ref 0–37)
Albumin: 4 g/dL (ref 3.5–5.2)
Alkaline Phosphatase: 43 U/L (ref 39–117)
BUN: 6 mg/dL (ref 6–23)
CO2: 31 meq/L (ref 19–32)
CREATININE: 0.83 mg/dL (ref 0.50–1.10)
Calcium: 9.3 mg/dL (ref 8.4–10.5)
Chloride: 97 mEq/L (ref 96–112)
GLUCOSE: 94 mg/dL (ref 70–99)
Potassium: 3 mEq/L — ABNORMAL LOW (ref 3.7–5.3)
Sodium: 140 mEq/L (ref 137–147)
TOTAL PROTEIN: 7.3 g/dL (ref 6.0–8.3)
Total Bilirubin: 0.4 mg/dL (ref 0.3–1.2)

## 2014-01-26 LAB — URINALYSIS, ROUTINE W REFLEX MICROSCOPIC
Bilirubin Urine: NEGATIVE
GLUCOSE, UA: NEGATIVE mg/dL
Ketones, ur: 15 mg/dL — AB
LEUKOCYTES UA: NEGATIVE
Nitrite: NEGATIVE
Protein, ur: NEGATIVE mg/dL
Urobilinogen, UA: 0.2 mg/dL (ref 0.0–1.0)
pH: 7 (ref 5.0–8.0)

## 2014-01-26 LAB — LIPASE, BLOOD: LIPASE: 45 U/L (ref 11–59)

## 2014-01-26 LAB — URINE MICROSCOPIC-ADD ON

## 2014-01-26 LAB — HCG, SERUM, QUALITATIVE: Preg, Serum: NEGATIVE

## 2014-01-26 LAB — RAPID URINE DRUG SCREEN, HOSP PERFORMED
Amphetamines: NOT DETECTED
Barbiturates: NOT DETECTED
Benzodiazepines: NOT DETECTED
Cocaine: NOT DETECTED
Opiates: POSITIVE — AB
Tetrahydrocannabinol: POSITIVE — AB

## 2014-01-26 LAB — MAGNESIUM: MAGNESIUM: 2 mg/dL (ref 1.5–2.5)

## 2014-01-26 LAB — PHOSPHORUS: Phosphorus: 3.1 mg/dL (ref 2.3–4.6)

## 2014-01-26 IMAGING — CT CT ABD-PELV W/ CM
2 of 3 series · 17 of 46 positions shown, 19 images · IV contrast (Omnipaque 300)
Comparison: [DATE]

CLINICAL DATA: GE reflux, left-sided abdominal pain.

EXAM:
CT ABDOMEN AND PELVIS WITH CONTRAST
TECHNIQUE: Multidetector CT imaging of the abdomen and pelvis was performed
using the standard protocol following bolus administration of
intravenous contrast.
CONTRAST:  50mL OMNIPAQUE IOHEXOL 300 MG/ML SOLN, 100mL OMNIPAQUE
IOHEXOL 300 MG/ML SOLN

[Series 2: abd_pel_with 5.0 b40f · axial · 0.71mm/px · z∈[-459,-89]mm · 14 of 86 slices shown, 16 images]
[im 6/86  soft-tissue]
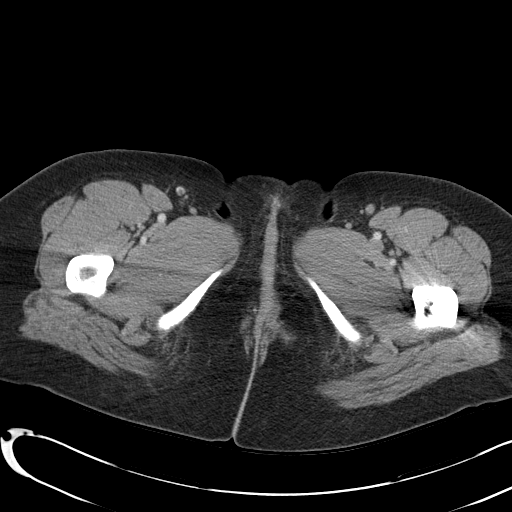
[im 6/86  bone]
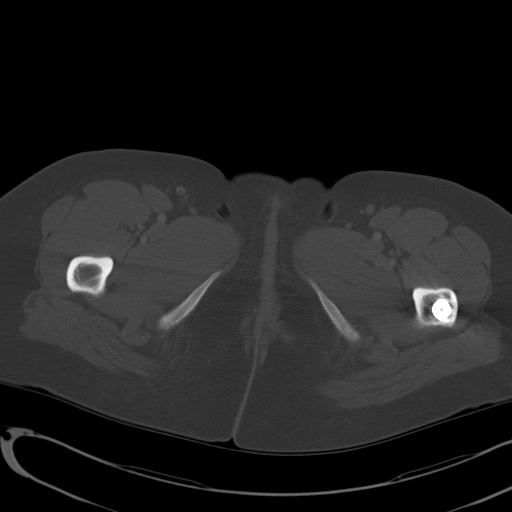
[im 11/86  soft-tissue]
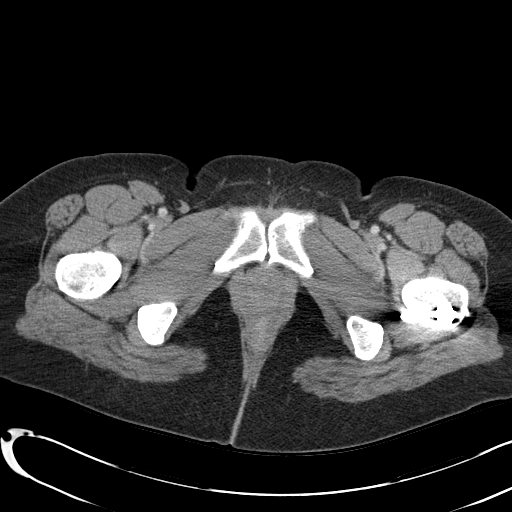
[im 17/86  soft-tissue]
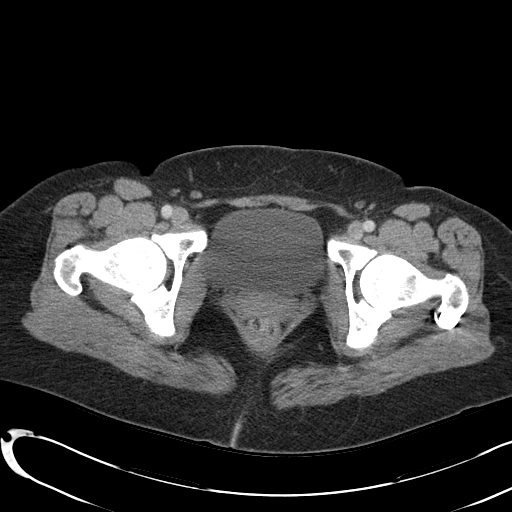
[im 22/86  soft-tissue]
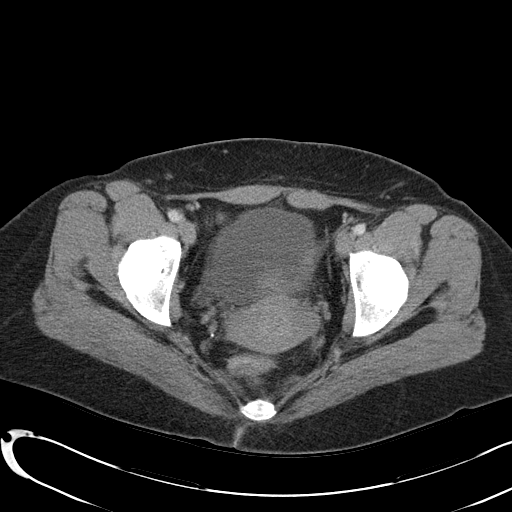
[im 28/86  soft-tissue]
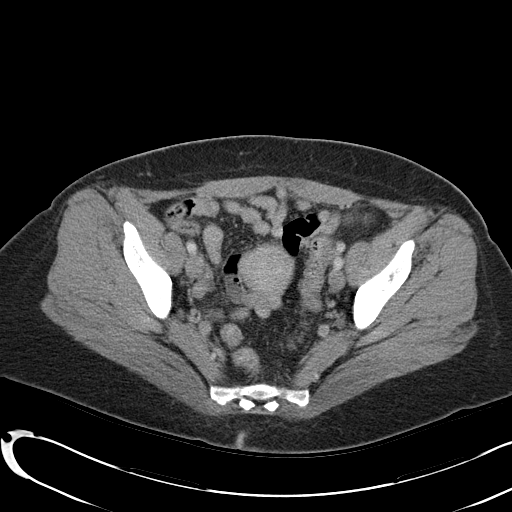
[im 33/86  soft-tissue]
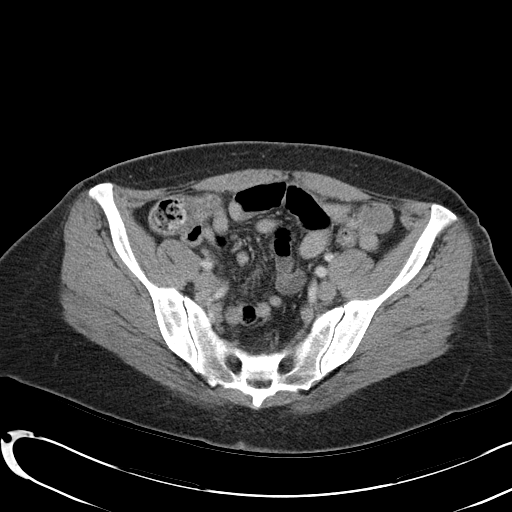
[im 39/86  soft-tissue]
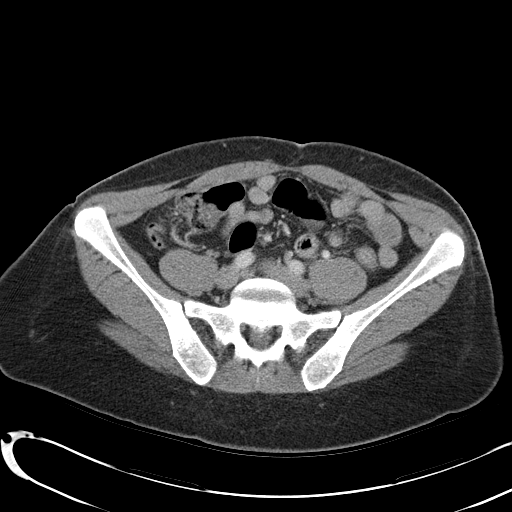
[im 47/86  soft-tissue]
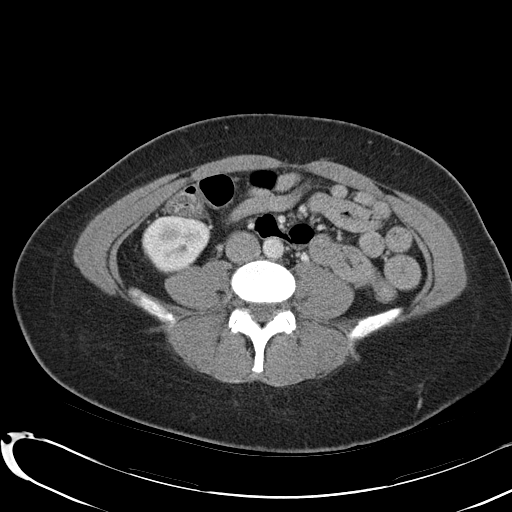
[im 53/86  soft-tissue]
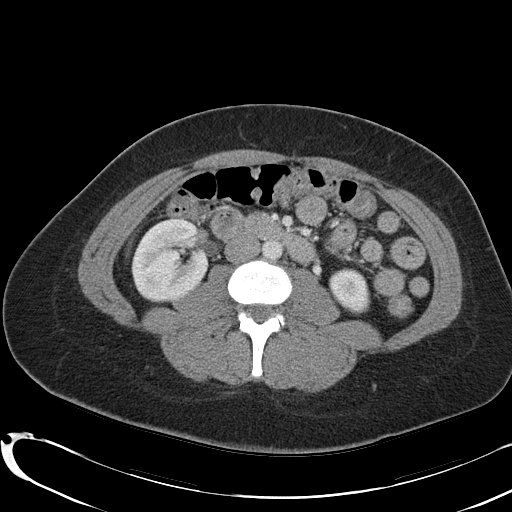
[im 53/86  bone]
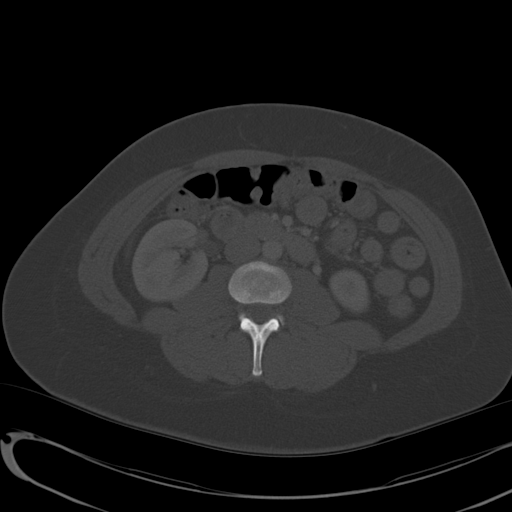
[im 58/86  soft-tissue]
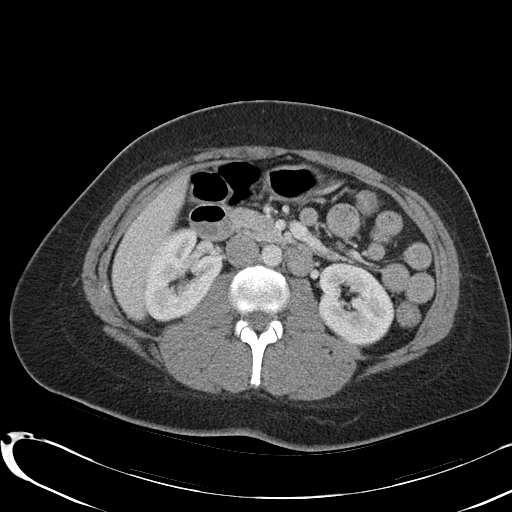
[im 64/86  soft-tissue]
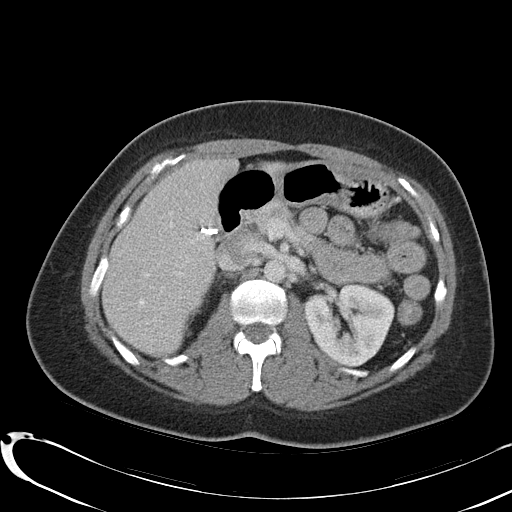
[im 69/86  soft-tissue]
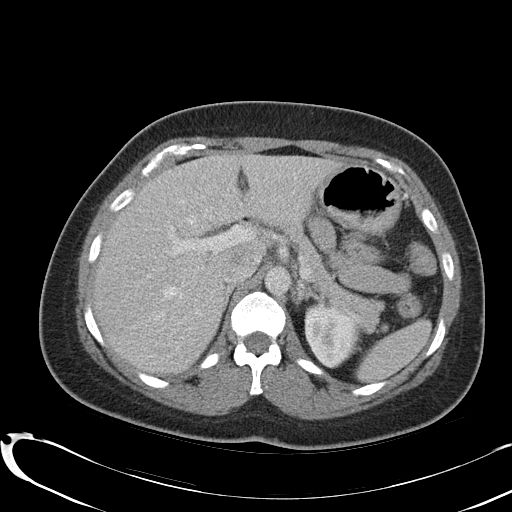
[im 75/86  soft-tissue]
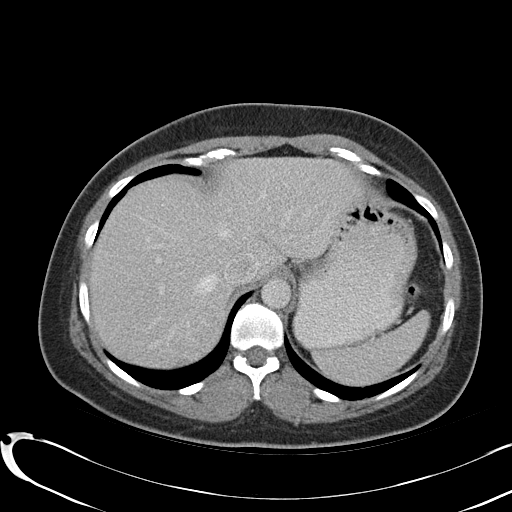
[im 80/86  soft-tissue]
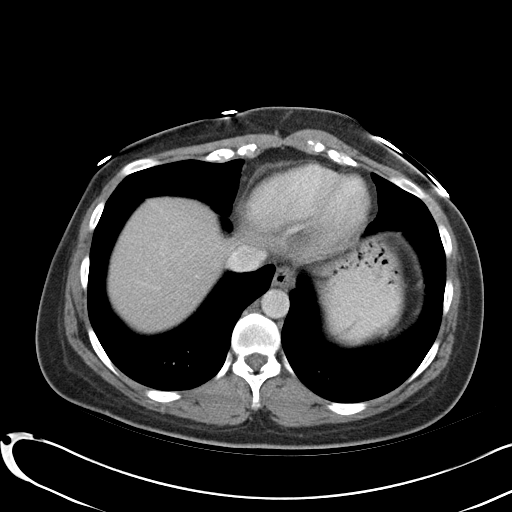

[Series 4: abd_pel_with 3.0 spo · coronal · 0.66mm/px · 3 of 76 slices shown]
[im 26/76  soft-tissue]
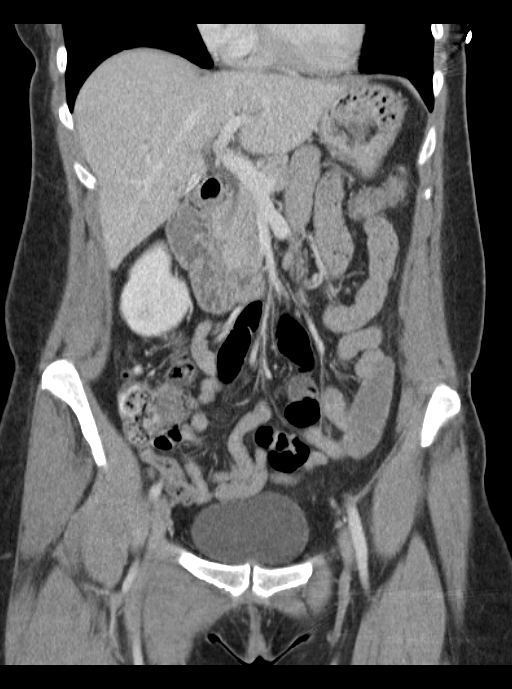
[im 34/76  soft-tissue]
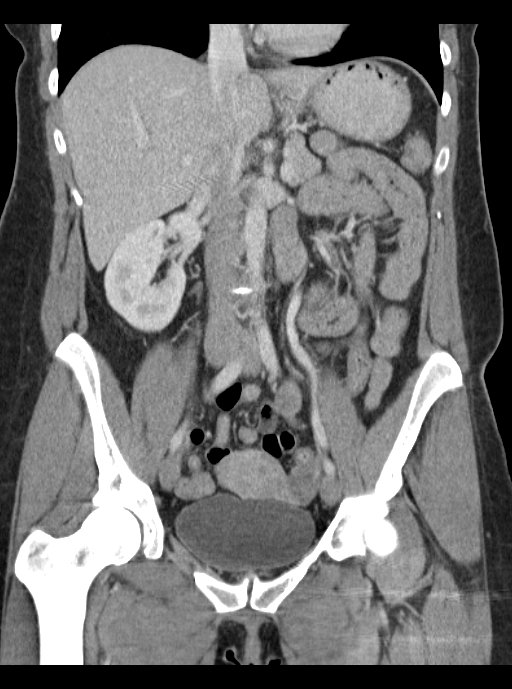
[im 42/76  soft-tissue]
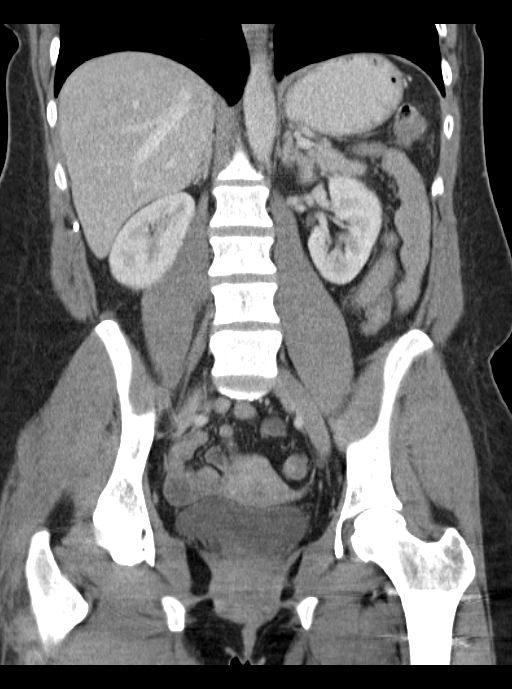

[17 of 46 positions shown; findings below may reference images not displayed]

FINDINGS: Visualized lung bases clear. Surgical clips in gallbladder fossa.
Unremarkable liver, spleen, adrenal glands, kidneys, pancreas,
aorta, IVC, portal vein. Stomach, small bowel, and colon are
nondilated. Normal appendix. Urinary bladder physiologically
distended, unremarkable. Uterus and adnexal regions unremarkable. No
ascites. No free air. No adenopathy. Left femoral IM rod and
interlocking screws, incompletely visualized.
IMPRESSION: 1. No acute abnormality.

## 2014-01-26 MED ORDER — METOCLOPRAMIDE HCL 10 MG PO TABS
10.0000 mg | ORAL_TABLET | Freq: Four times a day (QID) | ORAL | Status: DC | PRN
  Administered 2014-01-27 – 2014-01-28 (×3): 10 mg via ORAL
  Filled 2014-01-26 (×3): qty 1

## 2014-01-26 MED ORDER — AMLODIPINE BESYLATE 5 MG PO TABS
10.0000 mg | ORAL_TABLET | Freq: Every day | ORAL | Status: DC
  Administered 2014-01-26 – 2014-01-28 (×3): 10 mg via ORAL
  Filled 2014-01-26 (×3): qty 2

## 2014-01-26 MED ORDER — IOHEXOL 300 MG/ML  SOLN
100.0000 mL | Freq: Once | INTRAMUSCULAR | Status: AC | PRN
  Administered 2014-01-26: 100 mL via INTRAVENOUS

## 2014-01-26 MED ORDER — ONDANSETRON HCL 4 MG/2ML IJ SOLN
4.0000 mg | Freq: Once | INTRAMUSCULAR | Status: AC
  Administered 2014-01-26: 4 mg via INTRAVENOUS
  Filled 2014-01-26: qty 2

## 2014-01-26 MED ORDER — KETOROLAC TROMETHAMINE 30 MG/ML IJ SOLN
30.0000 mg | Freq: Four times a day (QID) | INTRAMUSCULAR | Status: DC | PRN
  Administered 2014-01-26 – 2014-01-28 (×6): 30 mg via INTRAVENOUS
  Filled 2014-01-26 (×6): qty 1

## 2014-01-26 MED ORDER — FAMOTIDINE IN NACL 20-0.9 MG/50ML-% IV SOLN
20.0000 mg | Freq: Once | INTRAVENOUS | Status: AC
  Administered 2014-01-26: 20 mg via INTRAVENOUS
  Filled 2014-01-26: qty 50

## 2014-01-26 MED ORDER — HYDROMORPHONE HCL PF 1 MG/ML IJ SOLN
1.0000 mg | INTRAMUSCULAR | Status: DC | PRN
  Administered 2014-01-26: 1 mg via INTRAVENOUS
  Filled 2014-01-26: qty 1

## 2014-01-26 MED ORDER — ONDANSETRON HCL 4 MG/2ML IJ SOLN
4.0000 mg | Freq: Four times a day (QID) | INTRAMUSCULAR | Status: DC | PRN
  Administered 2014-01-26 – 2014-01-27 (×3): 4 mg via INTRAVENOUS
  Filled 2014-01-26 (×3): qty 2

## 2014-01-26 MED ORDER — PROMETHAZINE HCL 25 MG/ML IJ SOLN
12.5000 mg | Freq: Once | INTRAMUSCULAR | Status: AC
  Administered 2014-01-26: 12.5 mg via INTRAVENOUS
  Filled 2014-01-26: qty 1

## 2014-01-26 MED ORDER — ENOXAPARIN SODIUM 40 MG/0.4ML ~~LOC~~ SOLN
40.0000 mg | SUBCUTANEOUS | Status: DC
  Administered 2014-01-26: 40 mg via SUBCUTANEOUS
  Filled 2014-01-26: qty 0.4

## 2014-01-26 MED ORDER — HYDROMORPHONE HCL PF 1 MG/ML IJ SOLN
1.0000 mg | INTRAMUSCULAR | Status: DC | PRN
  Administered 2014-01-27: 1 mg via INTRAVENOUS
  Filled 2014-01-26: qty 1

## 2014-01-26 MED ORDER — PANTOPRAZOLE SODIUM 40 MG PO TBEC
80.0000 mg | DELAYED_RELEASE_TABLET | Freq: Every day | ORAL | Status: DC
  Administered 2014-01-26 – 2014-01-27 (×2): 80 mg via ORAL
  Filled 2014-01-26 (×2): qty 2

## 2014-01-26 MED ORDER — ALPRAZOLAM 1 MG PO TABS
2.0000 mg | ORAL_TABLET | Freq: Four times a day (QID) | ORAL | Status: DC | PRN
  Administered 2014-01-26 – 2014-01-28 (×5): 2 mg via ORAL
  Filled 2014-01-26 (×5): qty 2

## 2014-01-26 MED ORDER — LORATADINE 10 MG PO TABS
10.0000 mg | ORAL_TABLET | Freq: Every day | ORAL | Status: DC
  Administered 2014-01-26 – 2014-01-28 (×3): 10 mg via ORAL
  Filled 2014-01-26 (×3): qty 1

## 2014-01-26 MED ORDER — MORPHINE SULFATE 4 MG/ML IJ SOLN
4.0000 mg | INTRAMUSCULAR | Status: DC | PRN
  Administered 2014-01-26: 4 mg via INTRAVENOUS
  Filled 2014-01-26: qty 1

## 2014-01-26 MED ORDER — OXYCODONE HCL 5 MG PO TABS: 5.0000 mg | ORAL_TABLET | ORAL | Status: DC | PRN

## 2014-01-26 MED ORDER — IOHEXOL 300 MG/ML  SOLN
50.0000 mL | Freq: Once | INTRAMUSCULAR | Status: AC | PRN
  Administered 2014-01-26: 50 mL via ORAL

## 2014-01-26 MED ORDER — ONDANSETRON HCL 4 MG PO TABS: 4.0000 mg | ORAL_TABLET | Freq: Four times a day (QID) | ORAL | Status: DC | PRN

## 2014-01-26 MED ORDER — SODIUM CHLORIDE 0.9 % IV SOLN
INTRAVENOUS | Status: DC
  Administered 2014-01-26 – 2014-01-28 (×2): via INTRAVENOUS

## 2014-01-26 MED ORDER — POTASSIUM CHLORIDE CRYS ER 20 MEQ PO TBCR
40.0000 meq | EXTENDED_RELEASE_TABLET | Freq: Once | ORAL | Status: AC
  Administered 2014-01-26: 40 meq via ORAL
  Filled 2014-01-26: qty 2

## 2014-01-26 NOTE — ED Provider Notes (Signed)
CSN: [id number]     Arrival date & time 01/26/14  0845 History   First MD Initiated Contact with Patient 01/26/14 [phone]    This chart was scribed for Tanna Furry, MD by Terressa Koyanagi, ED Scribe. This patient was seen in room APA12/APA12 and the patient's care was started at 9:07 AM.  PCP: Glo Herring., MD  Chief Complaint  Patient presents with  . Abdominal Pain   The history is provided by the patient. No language interpreter was used.   HPI Comments: [name] is a 33 y.o. female with a history of acid reflux, gastritis, nausea, vomiting and anxiety, who presents to the Emergency Department complaining of intermittent, worsening abd pain. Pt complains of associated intermittent emesis (8 episodes in the last 24 hours); melena; bloody stool;  diarrhea (onset today, 1 episode); chills; diaphoresis; light headedness (with episodes of imbalance and falling); chest pains (which resolved yesterday); and fatigue. Pt denies fever and any prior episodes of diarrhea, except for the episode earlier today. Pt reports she experienced similar symptoms 2 years ago. 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 Pt denies being on any blood thinners, including ASA, presently. Pt does note, however, that she currently takes 4 doses of 10-650 MG, Hydrocodone per day. Pt reports she is a former smoker and quit 2 years ago. Pt denies any alcohol or caffeine use. Pt further reports that she is due for a colonoscopy.   Pt was seen and treated in the ED on two separate occasions for similar symptoms in the past 4 days.      Past Medical History  Diagnosis Date  . Anxiety   . Acid reflux    Past Surgical History  Procedure Laterality Date  . Brain surgery    . Ectopic pregnancy surgery    . Cholecystectomy    . Abdominal hysterectomy    . Cesarean section    . Arm surgery    . Leg surgery     Family History  Problem Relation Age of Onset   . Arthritis    . Cancer    . Diabetes     History  Substance Use Topics  . Smoking status: Never Smoker   . Smokeless tobacco: Not on file  . Alcohol Use: No   OB History   Grav Para Term Preterm Abortions TAB SAB Ect Mult Living                 Review of Systems  Constitutional: Positive for chills, diaphoresis and fatigue. Negative for fever and appetite change.  HENT: Negative for mouth sores, sore throat and trouble swallowing.   Eyes: Negative for visual disturbance.  Respiratory: Negative for cough, chest tightness, shortness of breath and wheezing.   Cardiovascular: Negative for chest pain.  Gastrointestinal: Positive for vomiting, abdominal pain, diarrhea and blood in stool. Negative for nausea and abdominal distention.  Endocrine: Negative for polydipsia, polyphagia and polyuria.  Genitourinary: Negative for dysuria, frequency and hematuria.  Musculoskeletal: Negative for gait problem.  Skin: Negative for color change, pallor and rash.  Neurological: Positive for light-headedness. Negative for dizziness, syncope and headaches.  Hematological: Does not bruise/bleed easily.  Psychiatric/Behavioral: Negative for behavioral problems and confusion.      Allergies  Demerol and Orange fruit  Home Medications   Current Outpatient Rx  Name  Route  Sig  Dispense  Refill  . alprazolam (XANAX) 2 MG tablet  Oral   Take 2 mg by mouth 4 (four) times daily.          Marland Kitchen amLODipine (NORVASC) 10 MG tablet   Oral   Take 10 mg by mouth daily.         Marland Kitchen HYDROcodone-acetaminophen (NORCO) 10-325 MG per tablet   Oral   Take 1 tablet by mouth 4 (four) times daily. pain         . loratadine (CLARITIN) 10 MG tablet   Oral   Take 10 mg by mouth daily.         . metoCLOPramide (REGLAN) 10 MG tablet   Oral   Take 1 tablet (10 mg total) by mouth every 6 (six) hours as needed for nausea (nausea/headache).   12 tablet   0   . omeprazole (PRILOSEC) 40 MG capsule    Oral   Take 40 mg by mouth 2 (two) times daily.         . ondansetron (ZOFRAN) 8 MG tablet   Oral   Take 1 tablet (8 mg total) by mouth every 8 (eight) hours as needed.   12 tablet   0    Triage Vitals: BP 150/79  Pulse 60  Temp(Src) 98.1 F (36.7 C) (Oral)  Resp 18  Ht 5\' 3"  (1.6 m)  Wt 166 lb (75.297 kg)  BMI 29.41 kg/m2  SpO2 99%  Physical Exam  Constitutional: She is oriented to person, place, and time. She appears well-developed and well-nourished. No distress.  HENT:  Head: Normocephalic.  Eyes: Conjunctivae are normal. Pupils are equal, round, and reactive to light. No scleral icterus.  Neck: Normal range of motion. Neck supple. No thyromegaly present.  Cardiovascular: Normal rate, regular rhythm and normal heart sounds.  Exam reveals no gallop and no friction rub.   No murmur heard. Pulmonary/Chest: Effort normal and breath sounds normal. No respiratory distress. She has no wheezes. She has no rales.  Abdominal: Soft. Bowel sounds are normal. She exhibits no distension. There is tenderness (upper abd tenderness). There is no rebound.  Musculoskeletal: Normal range of motion.  Neurological: She is alert and oriented to person, place, and time.  Skin: Skin is warm and dry. No rash noted.  Psychiatric: She has a normal mood and affect. Her behavior is normal.    ED Course  Procedures (including critical care time) DIAGNOSTIC STUDIES: Oxygen Saturation is 99% on RA, normal by my interpretation.    COORDINATION OF CARE:  9:14 AM-Discussed treatment plan which includes CT, antinausea meds, labs, possible admittance to the hospital, with pt at bedside and pt agreed to plan.   Labs Review Labs Reviewed  CBC WITH DIFFERENTIAL - Abnormal; Notable for the following:    Monocytes Relative 15 (*)    Monocytes Absolute 1.4 (*)    All other components within normal limits  COMPREHENSIVE METABOLIC PANEL - Abnormal; Notable for the following:    Potassium 3.0 (*)    All  other components within normal limits  LIPASE, BLOOD  HCG, SERUM, QUALITATIVE   Imaging Review Ct Abdomen Pelvis W Contrast  01/26/2014   CLINICAL DATA:  GE reflux, left-sided abdominal pain.  EXAM: CT ABDOMEN AND PELVIS WITH CONTRAST  TECHNIQUE: Multidetector CT imaging of the abdomen and pelvis was performed using the standard protocol following bolus administration of intravenous contrast.  CONTRAST:  42mL OMNIPAQUE IOHEXOL 300 MG/ML SOLN, 131mL OMNIPAQUE IOHEXOL 300 MG/ML SOLN  COMPARISON:  07/05/2010  FINDINGS: Visualized lung bases clear. Surgical clips in gallbladder  fossa. Unremarkable liver, spleen, adrenal glands, kidneys, pancreas, aorta, IVC, portal vein. Stomach, small bowel, and colon are nondilated. Normal appendix. Urinary bladder physiologically distended, unremarkable. Uterus and adnexal regions unremarkable. No ascites. No free air. No adenopathy. Left femoral IM rod and interlocking screws, incompletely visualized.  IMPRESSION: 1. No acute abnormality.   Electronically Signed   By: Arne Cleveland M.D.   On: 01/26/2014 10:29     EKG Interpretation None      MDM   Final diagnoses:  Abdominal pain  Nausea and vomiting    Patient discussed with Dr. Olen Pel and the hospitalist service. She will be admitted.  I personally performed the services described in this documentation, which was scribed in my presence. The recorded information has been reviewed and is accurate.    Tanna Furry, MD 01/26/14 1253

## 2014-01-26 NOTE — H&P (Signed)
[facility] History and Physical  [name] YTK:[id number] DOB: April 03, 1981 DOA: 01/26/2014  Referring physician: ED physician PCP: Glo Herring., MD   Chief Complaint: nausea and vomiting   HPI:  Pt is 33 yo female who presents to [facility] ED with main concern of several days duration with nausea and non bloody vomiting, poor oral intake, Denies fevers, chills, sick contacts or exposure. No similar events in the past.   Assessment and Plan: Active Problems: Nausea and vomiting - unclear etiology - per pt, has not had anything like this in the past - will provide supportive care with IVF, analgesia and antiemetics - supplement electrolytes as indicated - UDS, BMP, CBC in AM - advance diet as pt able to tolerate   Radiological Exams on Admission: Ct Abdomen Pelvis W Contrast  01/26/2014   CLINICAL DATA:  GE reflux, left-sided abdominal pain.  EXAM: CT ABDOMEN AND PELVIS WITH CONTRAST  TECHNIQUE: Multidetector CT imaging of the abdomen and pelvis was performed using the standard protocol following bolus administration of intravenous contrast.  CONTRAST:  70mL OMNIPAQUE IOHEXOL 300 MG/ML SOLN, 14mL OMNIPAQUE IOHEXOL 300 MG/ML SOLN  COMPARISON:  07/05/2010  FINDINGS: Visualized lung bases clear. Surgical clips in gallbladder fossa. Unremarkable liver, spleen, adrenal glands, kidneys, pancreas, aorta, IVC, portal vein. Stomach, small bowel, and colon are nondilated. Normal appendix. Urinary bladder physiologically distended, unremarkable. Uterus and adnexal regions unremarkable. No ascites. No free air. No adenopathy. Left femoral IM rod and interlocking screws, incompletely visualized.  IMPRESSION: 1. No acute abnormality.   Electronically Signed   By: Arne Cleveland M.D.   On: 01/26/2014 10:29   Code Status: Full Family Communication: Pt at bedside Disposition Plan: Admit for further evaluation     Review of Systems:  Constitutional: Negative for fever, chills and malaise/fatigue.  Negative for diaphoresis.  HENT: Negative for hearing loss, ear pain, nosebleeds, congestion, sore throat, neck pain, tinnitus and ear discharge.   Eyes: Negative for blurred vision, double vision, photophobia, pain, discharge and redness.  Respiratory: Negative for cough, hemoptysis, sputum production, shortness of breath, wheezing and stridor.   Cardiovascular: Negative for chest pain, palpitations, orthopnea, claudication and leg swelling.  Gastrointestinal:  Negative for heartburn, constipation, blood in stool and melena.  Genitourinary: Negative for dysuria, urgency, frequency, hematuria and flank pain.  Musculoskeletal: Negative for myalgias, back pain, joint pain and falls.  Skin: Negative for itching and rash.  Neurological: Negative for tingling, tremors, sensory change, speech change, focal weakness, loss of consciousness and headaches.  Endo/Heme/Allergies: Negative for environmental allergies and polydipsia. Does not bruise/bleed easily.  Psychiatric/Behavioral: Negative for suicidal ideas. The patient is not nervous/anxious.      Past Medical History  Diagnosis Date  . Anxiety   . Acid reflux     Past Surgical History  Procedure Laterality Date  . Brain surgery    . Ectopic pregnancy surgery    . Cholecystectomy    . Abdominal hysterectomy    . Cesarean section    . Arm surgery    . Leg surgery      Social History:  reports that she has never smoked. She does not have any smokeless tobacco history on file. She reports that she does not drink alcohol or use illicit drugs.  Allergies  Allergen Reactions  . Demerol Nausea Only  . Orange Fruit [Citrus]     Fever blisters    Family History  Problem Relation Age of Onset  . Arthritis    . Cancer    .  Diabetes      Prior to Admission medications   Medication Sig Start Date End Date Taking? Authorizing Provider  alprazolam Duanne Moron) 2 MG tablet Take 2 mg by mouth 4 (four) times daily.    Yes Historical Provider, MD   amLODipine (NORVASC) 10 MG tablet Take 10 mg by mouth daily.   Yes Historical Provider, MD  HYDROcodone-acetaminophen (NORCO) 10-325 MG per tablet Take 1 tablet by mouth 4 (four) times daily. pain   Yes Historical Provider, MD  loratadine (CLARITIN) 10 MG tablet Take 10 mg by mouth daily.   Yes Historical Provider, MD  metoCLOPramide (REGLAN) 10 MG tablet Take 1 tablet (10 mg total) by mouth every 6 (six) hours as needed for nausea (nausea/headache). 01/24/14  Yes Sharyon Cable, MD  omeprazole (PRILOSEC) 40 MG capsule Take 40 mg by mouth 2 (two) times daily.   Yes Historical Provider, MD  ondansetron (ZOFRAN) 8 MG tablet Take 1 tablet (8 mg total) by mouth every 8 (eight) hours as needed. 01/24/14  Yes Sharyon Cable, MD    Physical Exam: Filed Vitals:   01/26/14 3818 01/26/14 0857  BP: 150/79   Pulse: 88 60  Temp: 98.1 F (36.7 C)   TempSrc: Oral   Resp: 18   Height: 5\' 3"  (1.6 m)   Weight: 75.297 kg (166 lb)   SpO2: 98% 99%    Physical Exam  Constitutional: Appears well-developed and well-nourished. No distress.  HENT: Normocephalic. External right and left ear normal. Oropharynx is clear and moist.  Eyes: Conjunctivae and EOM are normal. PERRLA, no scleral icterus.  Neck: Normal ROM. Neck supple. No JVD. No tracheal deviation. No thyromegaly.  CVS: RRR, S1/S2 +, no murmurs, no gallops, no carotid bruit.  Pulmonary: Effort and breath sounds normal, no stridor, rhonchi, wheezes, rales.  Abdominal: Soft. BS +,  no distension, tenderness, rebound or guarding.  Musculoskeletal: Normal range of motion. No edema and no tenderness.  Lymphadenopathy: No lymphadenopathy noted, cervical, inguinal. Neuro: Alert. Normal reflexes, muscle tone coordination. No cranial nerve deficit. Skin: Skin is warm and dry. No rash noted. Not diaphoretic. No erythema. No pallor.  Psychiatric: Normal mood and affect. Behavior, judgment, thought content normal.   Labs on Admission:  Basic Metabolic  Panel:  Recent Labs Lab 01/23/14 0900 01/24/14 0843 01/26/14 0938  NA 141 141 140  K 3.0* 3.5* 3.0*  CL 99 101 97  CO2 28 27 31   GLUCOSE 103* 110* 94  BUN 9 9 6   CREATININE 0.76 0.72 0.83  CALCIUM 9.5 8.9 9.3   Liver Function Tests:  Recent Labs Lab 01/23/14 0900 01/24/14 0843 01/26/14 0938  AST 19 19 18   ALT 15 15 21   ALKPHOS 45 41 43  BILITOT 0.4 0.4 0.4  PROT 8.1 7.4 7.3  ALBUMIN 4.4 4.1 4.0    Recent Labs Lab 01/24/14 0843 01/26/14 0938  LIPASE 23 45   CBC:  Recent Labs Lab 01/23/14 0900 01/24/14 0843 01/26/14 0938  WBC 10.9* 11.2* 9.3  NEUTROABS 8.0* 7.8* 5.8  HGB 13.4 12.8 13.6  HCT 39.4 37.8 38.9  MCV 94.0 93.8 92.0  PLT 281 229 227    EKG: Normal sinus rhythm, no ST/T wave changes  Faye Ramsay, MD  [facility] Pager [phone]  If 7PM-7AM, please contact night-coverage [email or web address] Password [email or web address] 01/26/2014, 12:19 PM

## 2014-01-26 NOTE — ED Notes (Signed)
Pt co ongoing abdominal pain and emesis with black tar-like stool. Emesis x8 in past 24 hours.

## 2014-01-26 NOTE — ED Notes (Signed)
POC hemoccult obtained by EDP with a result of negative.

## 2014-01-26 NOTE — ED Notes (Signed)
Hospitalist at bedside 

## 2014-01-27 ENCOUNTER — Encounter (HOSPITAL_COMMUNITY): Payer: Self-pay | Admitting: Gastroenterology

## 2014-01-27 DIAGNOSIS — R109 Unspecified abdominal pain: Secondary | ICD-10-CM

## 2014-01-27 LAB — BASIC METABOLIC PANEL
BUN: 6 mg/dL (ref 6–23)
CHLORIDE: 100 meq/L (ref 96–112)
CO2: 29 mEq/L (ref 19–32)
Calcium: 9.1 mg/dL (ref 8.4–10.5)
Creatinine, Ser: 0.78 mg/dL (ref 0.50–1.10)
GFR calc Af Amer: >90 mL/min (ref >90)
GLUCOSE: 93 mg/dL (ref 70–99)
POTASSIUM: 3.4 meq/L — AB (ref 3.7–5.3)
SODIUM: 140 meq/L (ref 137–147)

## 2014-01-27 LAB — CBC
HEMATOCRIT: 39.4 % (ref 36.0–46.0)
HEMOGLOBIN: 13.7 g/dL (ref 12.0–15.0)
MCH: 31.8 pg (ref 26.0–34.0)
MCHC: 34.8 g/dL (ref 30.0–36.0)
MCV: 91.4 fL (ref 78.0–100.0)
Platelets: 248 10*3/uL (ref 150–400)
RBC: 4.31 MIL/uL (ref 3.87–5.11)
RDW: 11.6 % (ref 11.5–15.5)
WBC: 7.8 10*3/uL (ref 4.0–10.5)

## 2014-01-27 LAB — TSH: TSH: 0.388 u[IU]/mL (ref 0.350–4.500)

## 2014-01-27 MED ORDER — OXYCODONE HCL 5 MG PO TABS: 5.0000 mg | ORAL_TABLET | Freq: Four times a day (QID) | ORAL | Status: DC | PRN

## 2014-01-27 MED ORDER — POTASSIUM CHLORIDE CRYS ER 20 MEQ PO TBCR
40.0000 meq | EXTENDED_RELEASE_TABLET | Freq: Once | ORAL | Status: AC
  Administered 2014-01-27: 40 meq via ORAL
  Filled 2014-01-27: qty 2

## 2014-01-27 MED ORDER — PROMETHAZINE HCL 25 MG/ML IJ SOLN
12.5000 mg | Freq: Four times a day (QID) | INTRAMUSCULAR | Status: DC | PRN
  Administered 2014-01-27 – 2014-01-28 (×2): 12.5 mg via INTRAVENOUS
  Filled 2014-01-27 (×2): qty 1

## 2014-01-27 MED ORDER — OXYCODONE-ACETAMINOPHEN 5-325 MG PO TABS: 1.0000 | ORAL_TABLET | ORAL | Status: DC | PRN

## 2014-01-27 MED ORDER — ONDANSETRON HCL 8 MG PO TABS: 8.0000 mg | ORAL_TABLET | Freq: Three times a day (TID) | ORAL | Status: DC | PRN

## 2014-01-27 MED ORDER — ALPRAZOLAM 2 MG PO TABS: 2.0000 mg | ORAL_TABLET | Freq: Four times a day (QID) | ORAL | Status: DC

## 2014-01-27 MED ORDER — PROMETHAZINE HCL 12.5 MG PO TABS: 12.5000 mg | ORAL_TABLET | Freq: Four times a day (QID) | ORAL | Status: DC | PRN

## 2014-01-27 MED ORDER — ENOXAPARIN SODIUM 40 MG/0.4ML ~~LOC~~ SOLN
40.0000 mg | SUBCUTANEOUS | Status: DC
  Administered 2014-01-28: 40 mg via SUBCUTANEOUS
  Filled 2014-01-27: qty 0.4

## 2014-01-27 MED ORDER — HYDROMORPHONE HCL PF 1 MG/ML IJ SOLN
1.0000 mg | INTRAMUSCULAR | Status: DC | PRN
  Administered 2014-01-27 – 2014-01-28 (×3): 1 mg via INTRAVENOUS
  Filled 2014-01-27 (×3): qty 1

## 2014-01-27 MED ORDER — PANTOPRAZOLE SODIUM 40 MG PO TBEC
40.0000 mg | DELAYED_RELEASE_TABLET | Freq: Two times a day (BID) | ORAL | Status: DC
  Administered 2014-01-27 – 2014-01-28 (×2): 40 mg via ORAL
  Filled 2014-01-27 (×2): qty 1

## 2014-01-27 NOTE — Consult Note (Addendum)
Referring Provider: No ref. provider found Primary Care Physician:  Glo Herring., MD Primary Gastroenterologist:  Barney Drain  Reason for Consultation:  NAUSEA/VOMITING  HPI:  PT HAS BEEN SEEN OFF AN ON AS AN INPATIENT SINCE 2003. LAST SEEN AND EVALUATED BY DR. Oneida Alar IN 2009 WHEN SHE WEIGHED 183 LBS AND HAD MULTIPLE PSYCHOSOCIAL STRESSORS. HAS DONE FAIRLY WELL. CHANGED HER DIET AND STOPPED SMOKING. LAST FLARE OF NAUSEA/VOMITING 3 YRS AGO FOR 2 WEEKS. SHE HAD THE SUDDEN ONSET OF NAUSEA AND VOMITING AFTER EATING [place] FROM THE [employer] STRIP. SHE SAW BRBPR IN HER EMESIS x1 MON AM. TOLERATING CLEAR LIQUIDS NOW. HEARTBURN FAIRLY WELL CONTROLLED UNLESS SHE EATS OR DRINKS SOMETHING THAT TRIGGERS GER SX. USES PRILOSEC 40 MG BID. LAST EGD 2009: H PYLORI GASTRITIS. NO ASPIRIN, BC/GOODY POWDERS, IBUPROFEN/MOTRIN, OR NAPROXEN/ALEVE OR ETOH. WANTS GO HOME.    Past Medical History  Diagnosis Date  . Anxiety   . Acid reflux   H PYLORI GASTRITIS  Past Surgical History  Procedure Laterality Date  . Brain surgery    . Ectopic pregnancy surgery    . Cholecystectomy    . Abdominal hysterectomy    . Cesarean section    . Arm surgery    . Leg surgery      Prior to Admission medications   Medication Sig Start Date End Date Taking? Authorizing Provider  amLODipine (NORVASC) 10 MG tablet Take 10 mg by mouth daily.   Yes Historical Provider, MD  loratadine (CLARITIN) 10 MG tablet Take 10 mg by mouth daily.   Yes Historical Provider, MD  metoCLOPramide (REGLAN) 10 MG tablet Take 1 tablet (10 mg total) by mouth every 6 (six) hours as needed for nausea (nausea/headache). 01/24/14  Yes Sharyon Cable, MD  omeprazole (PRILOSEC) 40 MG capsule Take 40 mg by mouth 2 (two) times daily.   Yes Historical Provider, MD  alprazolam Duanne Moron) 2 MG tablet Take 1 tablet (2 mg total) by mouth 4 (four) times daily. 01/27/14   Theodis Blaze, MD  ondansetron (ZOFRAN) 8 MG tablet Take 1 tablet (8 mg total) by mouth every 8  (eight) hours as needed. 01/27/14   Theodis Blaze, MD  oxyCODONE-acetaminophen (ROXICET) 5-325 MG per tablet Take 1 tablet by mouth every 4 (four) hours as needed for severe pain. 01/27/14   Theodis Blaze, MD  promethazine (PHENERGAN) 12.5 MG tablet Take 1 tablet (12.5 mg total) by mouth every 6 (six) hours as needed for nausea or vomiting. 01/27/14   Theodis Blaze, MD    Current Facility-Administered Medications  Medication Dose Route Frequency Provider Last Rate Last Dose  . 0.9 %  sodium chloride infusion   Intravenous Continuous Theodis Blaze, MD 75 mL/hr at 01/26/14 1443    . ALPRAZolam Duanne Moron) tablet 2 mg  2 mg Oral QID PRN Theodis Blaze, MD   2 mg at 01/27/14 0706  . amLODipine (NORVASC) tablet 10 mg  10 mg Oral Daily Theodis Blaze, MD   10 mg at 01/27/14 0934  . enoxaparin (LOVENOX) injection 40 mg  40 mg Subcutaneous Q24H Theodis Blaze, MD   40 mg at 01/26/14 1443  . HYDROmorphone (DILAUDID) injection 1 mg  1 mg Intravenous Q2H PRN Theodis Blaze, MD   1 mg at 01/27/14 0659  . ketorolac (TORADOL) 30 MG/ML injection 30 mg  30 mg Intravenous Q6H PRN Theodis Blaze, MD   30 mg at 01/27/14 0933  . loratadine (CLARITIN) tablet 10 mg  10 mg Oral Daily Iskra M Myers, MD   10 mg at 01/27/14 0934  . metoCLOPramide (REGLAN) tablet 10 mg  10 mg Oral Q6H PRN Iskra M Myers, MD   10 mg at 01/27/14 0825  . ondansetron (ZOFRAN) tablet 4 mg  4 mg Oral Q6H PRN Iskra M Myers, MD       Or  . ondansetron (ZOFRAN) injection 4 mg  4 mg Intravenous Q6H PRN Iskra M Myers, MD   4 mg at 01/27/14 0604  . oxyCODONE (Oxy IR/ROXICODONE) immediate release tablet 5 mg  5 mg Oral Q4H PRN Iskra M Myers, MD      . pantoprazole (PROTONIX) EC tablet 80 mg  80 mg Oral Daily Iskra M Myers, MD   80 mg at 01/27/14 0934  . potassium chloride SA (K-DUR,KLOR-CON) CR tablet 40 mEq  40 mEq Oral Once Iskra M Myers, MD        Allergies as of 01/26/2014 - Review Complete 01/26/2014  Allergen Reaction Noted  . Demerol Nausea Only 01/14/2011   . Orange fruit [citrus]  01/26/2014    Family History:  Colon Cancer  negative                           Polyps  Negative Family History  Problem Relation Age of Onset  . Arthritis    . Cancer    . Diabetes    . Cancer Maternal Grandfather     GASTRIC   History   Social History  . Marital Status: Single    Spouse Name: N/A    Number of Children: N/A  . Years of Education: N/A   Occupational History  . Not on file.   Social History Main Topics  . Smoking status: FORMER Smoker -QUIT 2 YRS AN 1 MO AGO  . Smokeless tobacco: Not on file  . Alcohol Use: No  . Drug Use: No  . Sexual Activity: Not on file     Review of Systems: PER HPI OTHERWISE ALL SYSTEMS ARE NEGATIVE.  Vitals: Blood pressure 132/84, pulse 78, temperature 98.2 F (36.8 C), temperature source Oral, resp. rate 20, height 5' 3" (1.6 m), weight 166 lb (75.297 kg), SpO2 98.00%.  Physical Exam: General:   Alert,  Well-developed, well-nourished, pleasant and cooperative in NAD Head:  Normocephalic and atraumatic. Eyes:  Sclera clear, no icterus.    Mouth:  No deformity or lesions, dentition ABnormal. Neck:  Supple; no masses. Lungs:  Clear throughout to auscultation.   No wheezes,. No acute distress. Heart:  Regular rate and rhythm; no murmurs. Abdomen:  Soft, nontender and nondistended. No masses noted. Normal bowel sounds, without guarding, and without rebound.   Msk:  Symmetrical without gross deformities. Normal posture. Extremities:  Without edema. Neurologic:  Alert and  oriented x4;  grossly normal neurologically. Cervical Nodes:  No significant cervical adenopathy. Skin: multiple tattos on upper extremities Psych:  Alert and cooperative. Normal mood and affect.  Lab Results:  Recent Labs  01/26/14 0938 01/27/14 0554  WBC 9.3 7.8  HGB 13.6 13.7  HCT 38.9 39.4  PLT 227 248   BMET  Recent Labs  01/26/14 0938 01/27/14 0554  NA 140 140  K 3.0* 3.4*  CL 97 100  CO2 31 29  GLUCOSE 94  93  BUN 6 6  CREATININE 0.83 0.78  CALCIUM 9.3 9.1   LFT  Recent Labs  01/26/14 0938  PROT 7.3  ALBUMIN 4.0    AST 18  ALT 21  ALKPHOS 43  BILITOT 0.4     Studies/Results: CT ABD/PELVIS W/ IVC APR 3: NAIAP   Impression: ADMITTED WITH NAUSEA/VOMITING/ABD PAIN LIKELY DUE TO VIRAL ILLNESS OR FLARE OF REFLUX. SX IMPROVED. ONE EPISODE OF HEMATEMESIS LIKLEY DUE TO EROSIVE EROSIVE ESOPHAGITIS, LESS LIKELY MALLORY WEISS TEAR OR GASTRIC CA.  Plan: 1. BID PRILOSEC 2. LOW FAT DIET 3. PREOP APR 6. EGD TUES APR 7 WITH PROPOFOL DUE TO POLYPHARMACY/FAILED CONSCIOUS SEDATION.  LOS: 1 day   Roxane Puerto  01/27/2014, 10:46 AM    

## 2014-01-27 NOTE — Progress Notes (Signed)
Patient ID: [name], female   DOB: 15-Jun-1981, 33 y.o.   MRN: [id number]  [facility] PROGRESS NOTE  [name] YHC:[id number] DOB: 05/09/1981 DOA: 01/26/2014 PCP: Glo Herring., MD  Brief narrative: Pt is 33 yo female who presents to [facility] with main concern of several days duration with nausea and non bloody vomiting, poor oral intake, Denies fevers, chills, sick contacts or exposure. No similar events in the past.   Assessment and Plan:  Active Problems:  Nausea and vomiting  - unclear etiology  - appreciate GI input, plan for EGD early next week - will continue to provide supportive care with IVF, analgesia and antiemetics  - supplement electrolytes as indicated  - low fat diet   Radiological Exams on Admission:   Ct Abdomen Pelvis W Contrast 01/26/2014 No acute abnormality. Consultants:  GI Antibiotics:  None  Code Status: Full Family Communication: Pt at bedside Disposition Plan: Home when medically stable  HPI/Subjective: No events overnight.   Objective: Filed Vitals:   01/26/14 1442 01/27/14 0100 01/27/14 0441 01/27/14 0933  BP: 125/82 128/75 129/83 132/84  Pulse:  67 78   Temp:  98.5 F (36.9 C) 98.2 F (36.8 C)   TempSrc:  Oral Oral   Resp:  20 20   Height:      Weight:      SpO2:  100% 98%     Intake/Output Summary (Last 24 hours) at 01/27/14 1436 Last data filed at 01/27/14 0500  Gross per 24 hour  Intake      0 ml  Output      0 ml  Net      0 ml    Exam:   General:  Pt is alert, follows commands appropriately, not in acute distress  Cardiovascular: Regular rate and rhythm, S1/S2, no murmurs, no rubs, no gallops  Respiratory: Clear to auscultation bilaterally, no wheezing, no crackles, no rhonchi  Abdomen: Soft, tender in left lower quadrant area, non distended, bowel sounds present, no guarding  Extremities: No edema, pulses DP and PT palpable bilaterally  Neuro: Grossly nonfocal  Data Reviewed: Basic Metabolic  Panel:  Recent Labs Lab 01/23/14 0900 01/24/14 0843 01/26/14 0938 01/26/14 1340 01/27/14 0554  NA 141 141 140  --  140  K 3.0* 3.5* 3.0*  --  3.4*  CL 99 101 97  --  100  CO2 28 27 31   --  29  GLUCOSE 103* 110* 94  --  93  BUN 9 9 6   --  6  CREATININE 0.76 0.72 0.83  --  0.78  CALCIUM 9.5 8.9 9.3  --  9.1  MG  --   --   --  2.0  --   PHOS  --   --   --  3.1  --    Liver Function Tests:  Recent Labs Lab 01/23/14 0900 01/24/14 0843 01/26/14 0938  AST 19 19 18   ALT 15 15 21   ALKPHOS 45 41 43  BILITOT 0.4 0.4 0.4  PROT 8.1 7.4 7.3  ALBUMIN 4.4 4.1 4.0    Recent Labs Lab 01/24/14 0843 01/26/14 0938  LIPASE 23 45   No results found for this basename: AMMONIA,  in the last 168 hours CBC:  Recent Labs Lab 01/23/14 0900 01/24/14 0843 01/26/14 0938 01/27/14 0554  WBC 10.9* 11.2* 9.3 7.8  NEUTROABS 8.0* 7.8* 5.8  --   HGB 13.4 12.8 13.6 13.7  HCT 39.4 37.8 38.9 39.4  MCV 94.0 93.8  92.0 91.4  PLT 281 229 227 248   Scheduled Meds: . amLODipine  10 mg Oral Daily  . [START ON 01/28/2014] enoxaparin (LOVENOX) injection  40 mg Subcutaneous Q24H  . loratadine  10 mg Oral Daily  . pantoprazole  40 mg Oral BID AC   Continuous Infusions: . sodium chloride 75 mL/hr at 01/26/14 1443     Faye Ramsay, MD  [facility] Pager [phone]  If 7PM-7AM, please contact night-coverage [email or web address] Password [email or web address] 01/27/2014, 2:36 PM   LOS: 1 day

## 2014-01-28 ENCOUNTER — Telehealth: Payer: Self-pay | Admitting: Gastroenterology

## 2014-01-28 LAB — BASIC METABOLIC PANEL
BUN: 6 mg/dL (ref 6–23)
CHLORIDE: 101 meq/L (ref 96–112)
CO2: 29 mEq/L (ref 19–32)
Calcium: 8.9 mg/dL (ref 8.4–10.5)
Creatinine, Ser: 0.78 mg/dL (ref 0.50–1.10)
GFR calc non Af Amer: >90 mL/min (ref >90)
Glucose, Bld: 93 mg/dL (ref 70–99)
POTASSIUM: 3.9 meq/L (ref 3.7–5.3)
Sodium: 138 mEq/L (ref 137–147)

## 2014-01-28 LAB — CBC
HCT: 40.2 % (ref 36.0–46.0)
Hemoglobin: 14 g/dL (ref 12.0–15.0)
MCH: 32 pg (ref 26.0–34.0)
MCHC: 34.8 g/dL (ref 30.0–36.0)
MCV: 92 fL (ref 78.0–100.0)
Platelets: 248 K/uL (ref 150–400)
RBC: 4.37 MIL/uL (ref 3.87–5.11)
RDW: 11.8 % (ref 11.5–15.5)
WBC: 7.9 K/uL (ref 4.0–10.5)

## 2014-01-28 MED ORDER — ALPRAZOLAM 2 MG PO TABS
2.0000 mg | ORAL_TABLET | Freq: Four times a day (QID) | ORAL | Status: DC
Start: 2014-01-28 — End: 2020-02-28

## 2014-01-28 MED ORDER — PROMETHAZINE HCL 12.5 MG RE SUPP: 25.0000 mg | Freq: Four times a day (QID) | RECTAL | Status: DC | PRN

## 2014-01-28 MED ORDER — HYDROMORPHONE HCL 2 MG PO TABS: 2.0000 mg | ORAL_TABLET | ORAL | Status: DC | PRN

## 2014-01-28 MED ORDER — ONDANSETRON HCL 8 MG PO TABS: 8.0000 mg | ORAL_TABLET | Freq: Three times a day (TID) | ORAL | Status: DC | PRN

## 2014-01-28 MED ORDER — PANTOPRAZOLE SODIUM 40 MG PO TBEC: 40.0000 mg | DELAYED_RELEASE_TABLET | Freq: Two times a day (BID) | ORAL | Status: DC

## 2014-01-28 NOTE — Discharge Summary (Signed)
Physician Discharge Summary  [name] PPJ:[id number] DOB: June 13, 1981 DOA: 01/26/2014  PCP: Glo Herring., MD  Admit date: 01/26/2014 Discharge date: 01/28/2014  Recommendations for Outpatient Follow-up:  1. Pt will need to follow up with PCP in 2-3 weeks post discharge 2. Has an appointment scheduled for pre op on Monday with planned EGD on Tuesday by Dr. Oneida Alar   Discharge Diagnoses:  Active Problems:   Nausea and vomiting   Discharge Condition: Stable  Diet recommendation: Heart healthy diet discussed in details   Brief narrative:  Pt is 33 yo female who presents to [facility] with main concern of several days duration with nausea and non bloody vomiting, poor oral intake, Denies fevers, chills, sick contacts or exposure. No similar events in the past.    Assessment and Plan:  Active Problems:  Nausea and vomiting  - unclear etiology  - appreciate GI input, plan for EGD early next week  - continued to provide supportive care with IVF, analgesia and antiemetics  - low fat diet, pt tolerating well    Radiological Exams on Admission:  Ct Abdomen Pelvis W Contrast 01/26/2014 No acute abnormality. Consultants:  GI Antibiotics:  None  Code Status: Full  Family Communication: Pt at bedside  Discharge Exam: Filed Vitals:   01/28/14 0500  BP: 135/84  Pulse: 62  Temp: 98.2 F (36.8 C)  Resp: 16   Filed Vitals:   01/27/14 0441 01/27/14 0933 01/27/14 1714 01/28/14 0500  BP: 129/83 132/84 146/83 135/84  Pulse: 78  74 62  Temp: 98.2 F (36.8 C)  98.4 F (36.9 C) 98.2 F (36.8 C)  TempSrc: Oral  Oral Oral  Resp: 20  20 16   Height:      Weight:      SpO2: 98%  99% 99%    General: Pt is alert, follows commands appropriately, not in acute distress Cardiovascular: Regular rate and rhythm, S1/S2 +, no murmurs, no rubs, no gallops Respiratory: Clear to auscultation bilaterally, no wheezing, no crackles, no rhonchi Abdominal: Soft, non tender, non distended, bowel sounds  +, no guarding Extremities: no edema, no cyanosis, pulses palpable bilaterally DP and PT Neuro: Grossly nonfocal  Discharge Instructions  Discharge Orders   Future Orders Complete By Expires   Diet - low sodium heart healthy  As directed    Increase activity slowly  As directed        Medication List    STOP taking these medications       HYDROcodone-acetaminophen 10-325 MG per tablet  Commonly known as:  NORCO      TAKE these medications       alprazolam 2 MG tablet  Commonly known as:  XANAX  Take 1 tablet (2 mg total) by mouth 4 (four) times daily.     amLODipine 10 MG tablet  Commonly known as:  NORVASC  Take 10 mg by mouth daily.     HYDROmorphone 2 MG tablet  Commonly known as:  DILAUDID  Take 1 tablet (2 mg total) by mouth every 4 (four) hours as needed for severe pain.     loratadine 10 MG tablet  Commonly known as:  CLARITIN  Take 10 mg by mouth daily.     metoCLOPramide 10 MG tablet  Commonly known as:  REGLAN  Take 1 tablet (10 mg total) by mouth every 6 (six) hours as needed for nausea (nausea/headache).     omeprazole 40 MG capsule  Commonly known as:  PRILOSEC  Take 40 mg by  mouth 2 (two) times daily.     ondansetron 8 MG tablet  Commonly known as:  ZOFRAN  Take 1 tablet (8 mg total) by mouth every 8 (eight) hours as needed.     pantoprazole 40 MG tablet  Commonly known as:  PROTONIX  Take 1 tablet (40 mg total) by mouth 2 (two) times daily before a meal.     promethazine 12.5 MG suppository  Commonly known as:  PHENERGAN  Place 2 suppositories (25 mg total) rectally every 6 (six) hours as needed for nausea or vomiting.           Follow-up Information   Schedule an appointment as soon as possible for a visit with Glo Herring., MD.   Specialty:  Internal Medicine   Contact information:   [address] [place] [address] [phone]       Follow up with Faye Ramsay, MD. (As needed, If symptoms  worsen call my cell phone [phone])    Specialty:  Internal Medicine   Contact information:   [address] [place] [address] [phone]        The results of significant diagnostics from this hospitalization (including imaging, microbiology, ancillary and laboratory) are listed below for reference.     Microbiology: No results found for this or any previous visit (from the past 240 hour(s)).   Labs: Basic Metabolic Panel:  Recent Labs Lab 01/23/14 0900 01/24/14 0843 01/26/14 0938 01/26/14 1340 01/27/14 0554 01/28/14 0640  NA 141 141 140  --  140 138  K 3.0* 3.5* 3.0*  --  3.4* 3.9  CL 99 101 97  --  100 101  CO2 28 27 31   --  29 29  GLUCOSE 103* 110* 94  --  93 93  BUN 9 9 6   --  6 6  CREATININE 0.76 0.72 0.83  --  0.78 0.78  CALCIUM 9.5 8.9 9.3  --  9.1 8.9  MG  --   --   --  2.0  --   --   PHOS  --   --   --  3.1  --   --    Liver Function Tests:  Recent Labs Lab 01/23/14 0900 01/24/14 0843 01/26/14 0938  AST 19 19 18   ALT 15 15 21   ALKPHOS 45 41 43  BILITOT 0.4 0.4 0.4  PROT 8.1 7.4 7.3  ALBUMIN 4.4 4.1 4.0    Recent Labs Lab 01/24/14 0843 01/26/14 0938  LIPASE 23 45   No results found for this basename: AMMONIA,  in the last 168 hours CBC:  Recent Labs Lab 01/23/14 0900 01/24/14 0843 01/26/14 0938 01/27/14 0554 01/28/14 0640  WBC 10.9* 11.2* 9.3 7.8 7.9  NEUTROABS 8.0* 7.8* 5.8  --   --   HGB 13.4 12.8 13.6 13.7 14.0  HCT 39.4 37.8 38.9 39.4 40.2  MCV 94.0 93.8 92.0 91.4 92.0  PLT 281 229 227 248 248   Cardiac Enzymes: No results found for this basename: CKTOTAL, CKMB, CKMBINDEX, TROPONINI,  in the last 168 hours BNP: BNP (last 3 results) No results found for this basename: PROBNP,  in the last 8760 hours CBG: No results found for this basename: GLUCAP,  in the last 168 hours   SIGNED: Time coordinating discharge: Over 30 minutes  Faye Ramsay, MD  [facility] 01/28/2014, 9:19 AM Pager  [phone]  If 7PM-7AM, please contact night-coverage [email or web address] Password [email or web address]

## 2014-01-28 NOTE — Progress Notes (Signed)
Utilization review Completed Markala Sitts RN BSN   

## 2014-01-28 NOTE — Discharge Instructions (Signed)
Abdominal Pain, Adult °Many things can cause abdominal pain. Usually, abdominal pain is not caused by a disease and will improve without treatment. It can often be observed and treated at home. Your health care provider will do a physical exam and possibly order blood tests and X-rays to help determine the seriousness of your pain. However, in many cases, more time must pass before a clear cause of the pain can be found. Before that point, your health care provider may not know if you need more testing or further treatment. °HOME CARE INSTRUCTIONS  °Monitor your abdominal pain for any changes. The following actions may help to alleviate any discomfort you are experiencing: °· Only take over-the-counter or prescription medicines as directed by your health care provider. °· Do not take laxatives unless directed to do so by your health care provider. °· Try a clear liquid diet (broth, tea, or water) as directed by your health care provider. Slowly move to a bland diet as tolerated. °SEEK MEDICAL CARE IF: °· You have unexplained abdominal pain. °· You have abdominal pain associated with nausea or diarrhea. °· You have pain when you urinate or have a bowel movement. °· You experience abdominal pain that wakes you in the night. °· You have abdominal pain that is worsened or improved by eating food. °· You have abdominal pain that is worsened with eating fatty foods. °SEEK IMMEDIATE MEDICAL CARE IF:  °· Your pain does not go away within 2 hours. °· You have a fever. °· You keep throwing up (vomiting). °· Your pain is felt only in portions of the abdomen, such as the right side or the left lower portion of the abdomen. °· You pass bloody or black tarry stools. °MAKE SURE YOU: °· Understand these instructions.   °· Will watch your condition.   °· Will get help right away if you are not doing well or get worse.   °Document Released: 07/22/2005 Document Revised: 08/02/2013 Document Reviewed: 06/21/2013 °[employer]® Patient  Information ©2014 [employer]. ° °

## 2014-01-28 NOTE — Telephone Encounter (Signed)
PREOP APPT APR 6. EGD WITH PROPOFOL DUE TO POLYPHARMACY/FAILED CONSCIOUS SEDATION.APR 7 AFTER 1000 DX: HEMATEMESIS. OPV IN 4 MOS E30 W/ SLF.

## 2014-01-29 ENCOUNTER — Other Ambulatory Visit: Payer: Self-pay | Admitting: Gastroenterology

## 2014-01-29 ENCOUNTER — Encounter (HOSPITAL_COMMUNITY): Payer: Self-pay | Admitting: Pharmacy Technician

## 2014-01-29 ENCOUNTER — Encounter (HOSPITAL_COMMUNITY): Payer: Self-pay

## 2014-01-29 ENCOUNTER — Encounter (HOSPITAL_COMMUNITY)
Admission: RE | Admit: 2014-01-29 | Discharge: 2014-01-29 | Disposition: A | Discharge status: Home / Self Care | Payer: Medicaid Other | Source: Ambulatory Visit | Attending: Gastroenterology | Admitting: Gastroenterology

## 2014-01-29 HISTORY — DX: Essential (primary) hypertension: I10

## 2014-01-29 NOTE — Pre-Procedure Instructions (Signed)
Patient given information to sign up for my chart at home. 

## 2014-01-29 NOTE — Telephone Encounter (Signed)
Pre Op at [facility] is now and [name] is aware

## 2014-01-29 NOTE — Telephone Encounter (Signed)
EGD w/Propofol is scheduled on 4/07 at 10:15

## 2014-01-29 NOTE — Telephone Encounter (Signed)
Reminder in epic °

## 2014-01-29 NOTE — Telephone Encounter (Signed)
Open in error

## 2014-01-29 NOTE — Patient Instructions (Signed)
[name]  01/29/2014   Your procedure is scheduled on:  01/30/2014  Report to [facility] at  [place]  AM.  Call this number if you have problems the morning of surgery: [phone]   Remember:   Do not eat food or drink liquids after midnight.   Take these medicines the morning of surgery with A SIP OF WATER:  Xanax, amlodipine, hydromorphone, claritin, reglan, prilosec, zofran, protonix   Do not wear jewelry, make-up or nail polish.  Do not wear lotions, powders, or perfumes.   Do not shave 48 hours prior to surgery. Men may shave face and neck.  Do not bring valuables to the hospital.  [facility] is not responsible for any belongings or valuables.               Contacts, dentures or bridgework may not be worn into surgery.  Leave suitcase in the car. After surgery it may be brought to your room.  For patients admitted to the hospital, discharge time is determined by your treatment team.               Patients discharged the day of surgery will not be allowed to drive home.  Name and phone number of your driver:  family  Special Instructions: N/A   Please read over the following fact sheets that you were given: Pain Booklet, Coughing and Deep Breathing, Surgical Site Infection Prevention, Anesthesia Post-op Instructions and Care and Recovery After Surgery Esophagogastroduodenoscopy Esophagogastroduodenoscopy (EGD) is a procedure to examine the lining of the esophagus, stomach, and first part of the small intestine (duodenum). A long, flexible, lighted tube with a camera attached (endoscope) is inserted down the throat to view these organs. This procedure is done to detect problems or abnormalities, such as inflammation, bleeding, ulcers, or growths, in order to treat them. The procedure lasts about 5 20 minutes. It is usually an outpatient procedure, but it may need to be performed in emergency cases in the hospital. LET YOUR CAREGIVER KNOW ABOUT:   Allergies to food or  medicine.  All medicines you are taking, including vitamins, herbs, eyedrops, and over-the-counter medicines and creams.  Use of steroids (by mouth or creams).  Previous problems you or members of your family have had with the use of anesthetics.  Any blood disorders you have.  Previous surgeries you have had.  Other health problems you have.  Possibility of pregnancy, if this applies. RISKS AND COMPLICATIONS  Generally, EGD is a safe procedure. However, as with any procedure, complications can occur. Possible complications include:  Infection.  Bleeding.  Tearing (perforation) of the esophagus, stomach, or duodenum.  Difficulty breathing or not being able to breath.  Excessive sweating.  Spasms of the larynx.  Slowed heartbeat.  Low blood pressure. BEFORE THE PROCEDURE  Do not eat or drink anything for 6 8 hours before the procedure or as directed by your caregiver.  Ask your caregiver about changing or stopping your regular medicines.  If you wear dentures, be prepared to remove them before the procedure.  Arrange for someone to drive you home after the procedure. PROCEDURE   A vein will be accessed to give medicines and fluids. A medicine to relax you (sedative) and a pain reliever will be given through that access into the vein.  A numbing medicine (local anesthetic) may be sprayed on your throat for comfort and to stop you from gagging or coughing.  A mouth guard may be  placed in your mouth to protect your teeth and to keep you from biting on the endoscope.  You will be asked to lie on your left side.  The endoscope is inserted down your throat and into the esophagus, stomach, and duodenum.  Air is put through the endoscope to allow your caregiver to view the lining of your esophagus clearly.  The esophagus, stomach, and duodenum is then examined. During the exam, your caregiver may:  Remove tissue to be examined under a microscope (biopsy) for  inflammation, infection, or other medical problems.  Remove growths.  Remove objects (foreign bodies) that are stuck.  Treat any bleeding with medicines or other devices that stop tissues from bleeding (hot cauters, clipping devices).  Widen (dilate) or stretch narrowed areas of the esophagus and stomach.  The endoscope will then be withdrawn. AFTER THE PROCEDURE  You will be taken to a recovery area to be monitored. You will be able to go home once you are stable and alert.  Do not eat or drink anything until the local anesthetic and numbing medicines have worn off. You may choke.  It is normal to feel bloated, have pain with swallowing, or have a sore throat for a short time. This will wear off.  Your caregiver should be able to discuss his or her findings with you. It will take longer to discuss the test results if any biopsies were taken. Document Released: 02/12/2005 Document Revised: 09/28/2012 Document Reviewed: 09/14/2012 [facility] Patient Information 2014 [place], [place]. PATIENT INSTRUCTIONS POST-ANESTHESIA  IMMEDIATELY FOLLOWING SURGERY:  Do not drive or operate machinery for the first twenty four hours after surgery.  Do not make any important decisions for twenty four hours after surgery or while taking narcotic pain medications or sedatives.  If you develop intractable nausea and vomiting or a severe headache please notify your doctor immediately.  FOLLOW-UP:  Please make an appointment with your surgeon as instructed. You do not need to follow up with anesthesia unless specifically instructed to do so.  WOUND CARE INSTRUCTIONS (if applicable):  Keep a dry clean dressing on the anesthesia/puncture wound site if there is drainage.  Once the wound has quit draining you may leave it open to air.  Generally you should leave the bandage intact for twenty four hours unless there is drainage.  If the epidural site drains for more than 36-48 hours please call the anesthesia  department.  QUESTIONS?:  Please feel free to call your physician or the hospital operator if you have any questions, and they will be happy to assist you.

## 2014-01-29 NOTE — Telephone Encounter (Signed)
[name]  She call to see about her time for EGD and told her you had a patient and would call her back.  Thanks

## 2014-01-30 ENCOUNTER — Ambulatory Visit (HOSPITAL_COMMUNITY)
Admission: RE | Admit: 2014-01-30 | Discharge: 2014-01-30 | Disposition: A | Discharge status: Home / Self Care | Payer: Medicaid Other | Source: Ambulatory Visit | Attending: Gastroenterology | Admitting: Gastroenterology

## 2014-01-30 ENCOUNTER — Encounter (HOSPITAL_COMMUNITY): Payer: Self-pay | Admitting: Anesthesiology

## 2014-01-30 ENCOUNTER — Ambulatory Visit (HOSPITAL_COMMUNITY): Payer: Medicaid Other | Admitting: Anesthesiology

## 2014-01-30 ENCOUNTER — Encounter (HOSPITAL_COMMUNITY): Payer: Medicaid Other | Admitting: Anesthesiology

## 2014-01-30 ENCOUNTER — Encounter (HOSPITAL_COMMUNITY)
Admission: RE | Disposition: A | Discharge status: Home / Self Care | Payer: Self-pay | Source: Ambulatory Visit | Attending: Gastroenterology

## 2014-01-30 DIAGNOSIS — K2980 Duodenitis without bleeding: Secondary | ICD-10-CM | POA: Insufficient documentation

## 2014-01-30 DIAGNOSIS — K3189 Other diseases of stomach and duodenum: Secondary | ICD-10-CM | POA: Insufficient documentation

## 2014-01-30 DIAGNOSIS — F411 Generalized anxiety disorder: Secondary | ICD-10-CM | POA: Insufficient documentation

## 2014-01-30 DIAGNOSIS — K2940 Chronic atrophic gastritis without bleeding: Secondary | ICD-10-CM | POA: Insufficient documentation

## 2014-01-30 DIAGNOSIS — Z79899 Other long term (current) drug therapy: Secondary | ICD-10-CM | POA: Insufficient documentation

## 2014-01-30 DIAGNOSIS — R112 Nausea with vomiting, unspecified: Secondary | ICD-10-CM

## 2014-01-30 DIAGNOSIS — R1013 Epigastric pain: Secondary | ICD-10-CM

## 2014-01-30 DIAGNOSIS — K920 Hematemesis: Secondary | ICD-10-CM

## 2014-01-30 HISTORY — PX: ESOPHAGOGASTRODUODENOSCOPY (EGD) WITH PROPOFOL: SHX5813

## 2014-01-30 SURGERY — ESOPHAGOGASTRODUODENOSCOPY (EGD) WITH PROPOFOL
Anesthesia: Monitor Anesthesia Care | Site: Esophagus

## 2014-01-30 MED ORDER — PROPOFOL INFUSION 10 MG/ML OPTIME
INTRAVENOUS | Status: DC | PRN
  Administered 2014-01-30: 125 ug/kg/min via INTRAVENOUS

## 2014-01-30 MED ORDER — STERILE WATER FOR IRRIGATION IR SOLN
Status: DC | PRN
  Administered 2014-01-30: 11:00:00

## 2014-01-30 MED ORDER — FENTANYL CITRATE 0.05 MG/ML IJ SOLN
INTRAMUSCULAR | Status: AC
  Filled 2014-01-30: qty 2

## 2014-01-30 MED ORDER — LACTATED RINGERS IV SOLN
INTRAVENOUS | Status: DC
  Administered 2014-01-30: 10:00:00 via INTRAVENOUS

## 2014-01-30 MED ORDER — FENTANYL CITRATE 0.05 MG/ML IJ SOLN
25.0000 ug | INTRAMUSCULAR | Status: AC
  Administered 2014-01-30 (×2): 25 ug via INTRAVENOUS

## 2014-01-30 MED ORDER — PROPOFOL 10 MG/ML IV BOLUS
INTRAVENOUS | Status: DC | PRN
  Administered 2014-01-30: 20 mg via INTRAVENOUS

## 2014-01-30 MED ORDER — FENTANYL CITRATE 0.05 MG/ML IJ SOLN: 25.0000 ug | INTRAMUSCULAR | Status: DC | PRN

## 2014-01-30 MED ORDER — MIDAZOLAM HCL 5 MG/5ML IJ SOLN
INTRAMUSCULAR | Status: DC | PRN
  Administered 2014-01-30: 2 mg via INTRAVENOUS

## 2014-01-30 MED ORDER — ONDANSETRON HCL 4 MG/2ML IJ SOLN: 4.0000 mg | Freq: Once | INTRAMUSCULAR | Status: DC | PRN

## 2014-01-30 MED ORDER — MIDAZOLAM HCL 2 MG/2ML IJ SOLN
INTRAMUSCULAR | Status: AC
  Filled 2014-01-30: qty 2

## 2014-01-30 MED ORDER — GLYCOPYRROLATE 0.2 MG/ML IJ SOLN
INTRAMUSCULAR | Status: AC
  Filled 2014-01-30: qty 1

## 2014-01-30 MED ORDER — BUTAMBEN-TETRACAINE-BENZOCAINE 2-2-14 % EX AERO
INHALATION_SPRAY | CUTANEOUS | Status: DC | PRN
  Administered 2014-01-30: 1 via TOPICAL

## 2014-01-30 MED ORDER — PROPOFOL 10 MG/ML IV BOLUS
INTRAVENOUS | Status: AC
  Filled 2014-01-30: qty 20

## 2014-01-30 MED ORDER — GLYCOPYRROLATE 0.2 MG/ML IJ SOLN
0.2000 mg | Freq: Once | INTRAMUSCULAR | Status: AC
  Administered 2014-01-30: 0.2 mg via INTRAVENOUS

## 2014-01-30 MED ORDER — MIDAZOLAM HCL 2 MG/2ML IJ SOLN
1.0000 mg | INTRAMUSCULAR | Status: DC | PRN
  Administered 2014-01-30 (×2): 1 mg via INTRAVENOUS

## 2014-01-30 SURGICAL SUPPLY — 20 items
BLOCK BITE 60FR ADLT L/F BLUE (MISCELLANEOUS) ×1 IMPLANT
ELECT REM PT RETURN 9FT ADLT (ELECTROSURGICAL)
ELECTRODE REM PT RTRN 9FT ADLT (ELECTROSURGICAL) IMPLANT
FLOOR PAD 36X40 (MISCELLANEOUS) ×2
FORCEPS BIOP RAD 4 LRG CAP 4 (CUTTING FORCEPS) ×1 IMPLANT
FORMALIN 10 PREFIL 20ML (MISCELLANEOUS) ×1 IMPLANT
KIT CLEAN ENDO COMPLIANCE (KITS) ×1 IMPLANT
MANIFOLD NEPTUNE II (INSTRUMENTS) ×2 IMPLANT
NDL SCLEROTHERAPY 25GX240 (NEEDLE) IMPLANT
NEEDLE SCLEROTHERAPY 25GX240 (NEEDLE) IMPLANT
PAD FLOOR 36X40 (MISCELLANEOUS) IMPLANT
PROBE APC STR FIRE (PROBE) IMPLANT
PROBE INJECTION GOLD (MISCELLANEOUS)
PROBE INJECTION GOLD 7FR (MISCELLANEOUS) IMPLANT
SNARE SHORT THROW 13M SML OVAL (MISCELLANEOUS) IMPLANT
SYR 50ML LL SCALE MARK (SYRINGE) ×1 IMPLANT
SYR INFLATION 60ML (SYRINGE) IMPLANT
TUBING ENDO SMARTCAP PENTAX (MISCELLANEOUS) ×1 IMPLANT
TUBING IRRIGATION ENDOGATOR (MISCELLANEOUS) ×1 IMPLANT
WATER STERILE IRR 1000ML POUR (IV SOLUTION) ×1 IMPLANT

## 2014-01-30 NOTE — Anesthesia Preprocedure Evaluation (Signed)
Anesthesia Evaluation  Patient identified by MRN, date of birth, ID band Patient awake    Reviewed: Allergy & Precautions, H&P , NPO status , Patient's Chart, lab work & pertinent test results  Airway Mallampati: I TM Distance: >3 FB Neck ROM: Full    Dental  (+) Teeth Intact, Poor Dentition   Pulmonary former smoker,  breath sounds clear to auscultation        Cardiovascular hypertension, Pt. on medications Rhythm:Regular Rate:Normal     Neuro/Psych PSYCHIATRIC DISORDERS Anxiety    GI/Hepatic GERD-  Medicated and Controlled,  Endo/Other    Renal/GU      Musculoskeletal   Abdominal   Peds  Hematology   Anesthesia Other Findings   Reproductive/Obstetrics                           Anesthesia Physical Anesthesia Plan  ASA: II  Anesthesia Plan: MAC   Post-op Pain Management:    Induction: Intravenous  Airway Management Planned: Simple Face Mask  Additional Equipment:   Intra-op Plan:   Post-operative Plan: Extubation in OR  Informed Consent: I have reviewed the patients History and Physical, chart, labs and discussed the procedure including the risks, benefits and alternatives for the proposed anesthesia with the patient or authorized representative who has indicated his/her understanding and acceptance.     Plan Discussed with:   Anesthesia Plan Comments:         Anesthesia Quick Evaluation

## 2014-01-30 NOTE — Anesthesia Postprocedure Evaluation (Signed)
  Anesthesia Post-op Note  Patient: [name]  Procedure(s) Performed: Procedure(s): ESOPHAGOGASTRODUODENOSCOPY (EGD) WITH PROPOFOL (N/A)  Patient Location: PACU  Anesthesia Type:MAC  Level of Consciousness: awake, alert  and oriented  Airway and Oxygen Therapy: Patient Spontanous Breathing and Patient connected to nasal cannula oxygen  Post-op Pain: none  Post-op Assessment: Post-op Vital signs reviewed, Patient's Cardiovascular Status Stable, Respiratory Function Stable, Patent Airway and No signs of Nausea or vomiting  Post-op Vital Signs: Reviewed and stable  Complications: No apparent anesthesia complications

## 2014-01-30 NOTE — Interval H&P Note (Signed)
History and Physical Interval Note:  01/30/2014 9:05 AM  [name]  has presented today for surgery, with the diagnosis of HEMATEMESIS  The various methods of treatment have been discussed with the patient and family. After consideration of risks, benefits and other options for treatment, the patient has consented to  Procedure(s) with comments: ESOPHAGOGASTRODUODENOSCOPY (EGD) WITH PROPOFOL (N/A) - 1015 as a surgical intervention .  The patient's history has been reviewed, patient examined, no change in status, stable for surgery.  I have reviewed the patient's chart and labs.  Questions were answered to the patient's satisfaction.     [employer]

## 2014-01-30 NOTE — Transfer of Care (Signed)
Immediate Anesthesia Transfer of Care Note  Patient: [name]  Procedure(s) Performed: Procedure(s): ESOPHAGOGASTRODUODENOSCOPY (EGD) WITH PROPOFOL (N/A)  Patient Location: PACU  Anesthesia Type:MAC  Level of Consciousness: awake, alert  and oriented  Airway & Oxygen Therapy: Patient Spontanous Breathing and Patient connected to nasal cannula oxygen  Post-op Assessment: Report given to PACU RN  Post vital signs: Reviewed and stable  Complications: No apparent anesthesia complications

## 2014-01-30 NOTE — Discharge Instructions (Signed)
YOUR UPPER ABDOMINAL PAIN & NAUSEA/vomiting ARE DUE TO gastritis & REFLUX. I biopsied your stomach & small bowel. THE BLOOD IN YOUR VOMIT MAY BE DUE TO GASTRITIS.    CONTINUE PROTONIX. TAKE 30 MINUTES PRIOR TO MEALS TWICE DAILY.  STRICTLY FOLLOW A LOW FAT DIET. SEE INFO BELOW.  YOUR BIOPSY WILL BE BACK IN 7 DAYS.  FOLLOW UP in AUG 2015.   UPPER ENDOSCOPY AFTER CARE Read the instructions outlined below and refer to this sheet in the next week. These discharge instructions provide you with general information on caring for yourself after you leave the hospital. While your treatment has been planned according to the most current medical practices available, unavoidable complications occasionally occur. If you have any problems or questions after discharge, call DR. Lanissa Cashen, [phone].  ACTIVITY  You may resume your regular activity, but move at a slower pace for the next 24 hours.   Take frequent rest periods for the next 24 hours.   Walking will help get rid of the air and reduce the bloated feeling in your belly (abdomen).   No driving for 24 hours (because of the medicine (anesthesia) used during the test).   You may shower.   Do not sign any important legal documents or operate any machinery for 24 hours (because of the anesthesia used during the test).    NUTRITION  Drink plenty of fluids.   You may resume your normal diet as instructed by your doctor.   Begin with a light meal and progress to your normal diet. Heavy or fried foods are harder to digest and may make you feel sick to your stomach (nauseated).   Avoid alcoholic beverages for 24 hours or as instructed.    MEDICATIONS  You may resume your normal medications.   WHAT YOU CAN EXPECT TODAY  Some feelings of bloating in the abdomen.   Passage of more gas than usual.    IF YOU HAD A BIOPSY TAKEN DURING THE UPPER ENDOSCOPY:  Eat a soft diet IF YOU HAVE NAUSEA, BLOATING, ABDOMINAL PAIN, OR  VOMITING.    FINDING OUT THE RESULTS OF YOUR TEST Not all test results are available during your visit. DR. Darrick PennaFIELDS WILL CALL YOU WITHIN 7 DAYS OF YOUR PROCEDUE WITH YOUR RESULTS. Do not assume everything is normal if you have not heard from DR. Lovelle Deitrick IN ONE WEEK, CALL HER OFFICE AT [phone].  SEEK IMMEDIATE MEDICAL ATTENTION AND CALL THE OFFICE: [phone] IF:  You have more than a spotting of blood in your stool.   Your belly is swollen (abdominal distention).   You are nauseated or vomiting.   You have a temperature over 101F.   You have abdominal pain or discomfort that is severe or gets worse throughout the day.   Gastritis  Gastritis is an inflammation (the body's way of reacting to injury and/or infection) of the stomach. It is often caused by bacterial (germ) infections. It can also be caused BY ASPIRIN, BC/GOODY POWDER'S, (IBUPROFEN) MOTRIN, OR ALEVE (NAPROXEN), chemicals (including alcohol), SPICY FOODS, and medications. This illness may be associated with generalized malaise (feeling tired, not well), UPPER ABDOMINAL STOMACH cramps, and fever. One common bacterial cause of gastritis is an organism known as H. Pylori. This can be treated with antibiotics.   REFLUX   TREATMENT There are a number of non-prescription medicines used to treat reflux including: Antacids.  ZANTAC Proton-pump inhibitors: PRILOSEC (OMEPRAZOLE)  HOME CARE INSTRUCTIONS Eat 2-3 hours before going to bed.  Try to reach  and maintain a healthy weight. LOSE 10-20 LBS Do not eat just a few very large meals. Instead, eat 4 TO 6 smaller meals throughout the day.  Try to identify foods and beverages that make your symptoms worse, and avoid these.  Avoid tight clothing.  Do not exercise right after eating.   Low-Fat Diet BREADS, CEREALS, PASTA, RICE, DRIED PEAS, AND BEANS These products are high in carbohydrates and most are low in fat. Therefore, they can be increased in the diet as substitutes  for fatty foods. They too, however, contain calories and should not be eaten in excess. Cereals can be eaten for snacks as well as for breakfast.  Include foods that contain fiber (fruits, vegetables, whole grains, and legumes). Research shows that fiber may lower blood cholesterol levels, especially the water-soluble fiber found in fruits, vegetables, oat products, and legumes. FRUITS AND VEGETABLES It is good to eat fruits and vegetables. Besides being sources of fiber, both are rich in vitamins and some minerals. They help you get the daily allowances of these nutrients. Fruits and vegetables can be used for snacks and desserts. MEATS Limit lean meat, chicken, [place], and fish to no more than 6 ounces per day. Beef, Pork, and Lamb Use lean cuts of beef, pork, and lamb. Lean cuts include:  Extra-lean ground beef.  Arm roast.  Sirloin tip.  Center-cut ham.  Round steak.  Loin chops.  Rump roast.  Tenderloin.  Trim all fat off the outside of meats before cooking. It is not necessary to severely decrease the intake of red meat, but lean choices should be made. Lean meat is rich in protein and contains a highly absorbable form of iron. Premenopausal women, in particular, should avoid reducing lean red meat because this could increase the risk for low red blood cells (iron-deficiency anemia).  Chicken and [place] These are good sources of protein. The fat of poultry can be reduced by removing the skin and underlying fat layers before cooking. Chicken and [place] can be substituted for lean red meat in the diet. Poultry should not be fried or covered with high-fat sauces. Fish and Shellfish Fish is a good source of protein. Shellfish contain cholesterol, but they usually are low in saturated fatty acids. The preparation of fish is important. Like chicken and [place], they should not be fried or covered with high-fat sauces. EGGS Egg whites contain no fat or cholesterol. They can be eaten often. Try 1  to 2 egg whites instead of whole eggs in recipes or use egg substitutes that do not contain yolk.  MILK AND DAIRY PRODUCTS Use skim or 1% milk instead of 2% or whole milk. Decrease whole milk, natural, and processed cheeses. Use nonfat or low-fat (2%) cottage cheese or low-fat cheeses made from vegetable oils. Choose nonfat or low-fat (1 to 2%) yogurt. Experiment with evaporated skim milk in recipes that call for heavy cream. Substitute low-fat yogurt or low-fat cottage cheese for sour cream in dips and salad dressings. Have at least 2 servings of low-fat dairy products, such as 2 glasses of skim (or 1%) milk each day to help get your daily calcium intake.  FATS AND OILS Butterfat, lard, and beef fats are high in saturated fat and cholesterol. These should be avoided.Vegetable fats do not contain cholesterol. AVOID coconut oil, palm oil, and palm kernel oil, WHICH are very high in saturated fats. These should be limited. These fats are often used in bakery goods, processed foods, popcorn, oils, and nondairy creamers. Vegetable shortenings  and some peanut butters contain hydrogenated oils, which are also saturated fats. Read the labels on these foods and check for saturated vegetable oils.  Desirable liquid vegetable oils are corn oil, cottonseed oil, olive oil, canola oil, safflower oil, soybean oil, and sunflower oil. Peanut oil is not as good, but small amounts are acceptable. Buy a heart-healthy tub margarine that has no partially hydrogenated oils in the ingredients. AVOID Mayonnaise and salad dressings often are made from unsaturated fats.  OTHER EATING TIPS Snacks  Most sweets should be limited as snacks. They tend to be rich in calories and fats, and their caloric content outweighs their nutritional value. Some good choices in snacks are graham crackers, melba toast, soda crackers, bagels (no egg), English muffins, fruits, and vegetables. These snacks are preferable to snack crackers, [place]  fries, and chips. Popcorn should be air-popped or cooked in small amounts of liquid vegetable oil.  Desserts Eat fruit, low-fat yogurt, and fruit ices instead of pastries, cake, and cookies. Sherbet, angel food cake, gelatin dessert, frozen low-fat yogurt, or other frozen products that do not contain saturated fat (pure fruit juice bars, frozen ice pops) are also acceptable.   COOKING METHODS Choose those methods that use little or no fat. They include: Poaching.  Braising.  Steaming.  Grilling.  Baking.  Stir-frying.  Broiling.  Microwaving.  Foods can be cooked in a nonstick pan without added fat, or use a nonfat cooking spray in regular cookware. Limit fried foods and avoid frying in saturated fat. Add moisture to lean meats by using water, broth, cooking wines, and other nonfat or low-fat sauces along with the cooking methods mentioned above. Soups and stews should be chilled after cooking. The fat that forms on top after a few hours in the refrigerator should be skimmed off. When preparing meals, avoid using excess salt. Salt can contribute to raising blood pressure in some people.  EATING AWAY FROM HOME Order entres, potatoes, and vegetables without sauces or butter. When meat exceeds the size of a deck of cards (3 to 4 ounces), the rest can be taken home for another meal. Choose vegetable or fruit salads and ask for low-calorie salad dressings to be served on the side. Use dressings sparingly. Limit high-fat toppings, such as bacon, crumbled eggs, cheese, sunflower seeds, and olives. Ask for heart-healthy tub margarine instead of butter.  REFLUX SYMPTOMS Common symptoms of GERD are heartburn (burning in your chest). This is worse when lying down or bending over. It may also cause belching and indigestion. Some of the things which make GERD worse are:  Increased weight pushes on stomach making acid rise more easily.   Smoking markedly increases acid production.   Alcohol  decreases lower esophageal sphincter pressure (valve between stomach and esophagus), allowing acid from stomach into esophagus.   Late evening meals and going to bed with a full stomach increases pressure.   Anything that causes an increase in acid production.   HOME CARE INSTRUCTIONS  Try to achieve and maintain an ideal body weight.   Avoid drinking alcoholic beverages.   DO NOT smokE.   Do not wear tight clothing around your chest or stomach.   Eat smaller meals and eat more frequently. This keeps your stomach from getting too full. Eat slowly.   Do not lie down for 2 or 3 hours after eating. Do not eat or drink anything 1 to 2 hours before going to bed.   Avoid caffeine beverages (colas, coffee, cocoa, tea), fatty foods,  citrus fruits and all other foods and drinks that contain acid and that seem to increase the problems.   Avoid bending over, especially after eating OR STRAINING. Anything that increases the pressure in your belly increases the amount of acid that may be pushed up into your esophagus.

## 2014-01-30 NOTE — H&P (View-Only) (Signed)
Referring Provider: No ref. provider found Primary Care Physician:  Glo Herring., MD Primary Gastroenterologist:  Barney Drain  Reason for Consultation:  NAUSEA/VOMITING  HPI:  PT HAS BEEN SEEN OFF AN ON AS AN INPATIENT SINCE 2003. LAST SEEN AND EVALUATED BY DR. Oneida Alar IN 2009 WHEN SHE WEIGHED 183 LBS AND HAD MULTIPLE PSYCHOSOCIAL STRESSORS. HAS DONE FAIRLY WELL. CHANGED HER DIET AND STOPPED SMOKING. LAST FLARE OF NAUSEA/VOMITING 3 YRS AGO FOR 2 WEEKS. SHE HAD THE SUDDEN ONSET OF NAUSEA AND VOMITING AFTER EATING [place] FROM THE [employer] STRIP. SHE SAW BRBPR IN HER EMESIS x1 MON AM. TOLERATING CLEAR LIQUIDS NOW. HEARTBURN FAIRLY WELL CONTROLLED UNLESS SHE EATS OR DRINKS SOMETHING THAT TRIGGERS GER SX. USES PRILOSEC 40 MG BID. LAST EGD 2009: H PYLORI GASTRITIS. NO ASPIRIN, BC/GOODY POWDERS, IBUPROFEN/MOTRIN, OR NAPROXEN/ALEVE OR ETOH. WANTS GO HOME.    Past Medical History  Diagnosis Date  . Anxiety   . Acid reflux   H PYLORI GASTRITIS  Past Surgical History  Procedure Laterality Date  . Brain surgery    . Ectopic pregnancy surgery    . Cholecystectomy    . Abdominal hysterectomy    . Cesarean section    . Arm surgery    . Leg surgery      Prior to Admission medications   Medication Sig Start Date End Date Taking? Authorizing Provider  amLODipine (NORVASC) 10 MG tablet Take 10 mg by mouth daily.   Yes Historical Provider, MD  loratadine (CLARITIN) 10 MG tablet Take 10 mg by mouth daily.   Yes Historical Provider, MD  metoCLOPramide (REGLAN) 10 MG tablet Take 1 tablet (10 mg total) by mouth every 6 (six) hours as needed for nausea (nausea/headache). 01/24/14  Yes Sharyon Cable, MD  omeprazole (PRILOSEC) 40 MG capsule Take 40 mg by mouth 2 (two) times daily.   Yes Historical Provider, MD  alprazolam Duanne Moron) 2 MG tablet Take 1 tablet (2 mg total) by mouth 4 (four) times daily. 01/27/14   Theodis Blaze, MD  ondansetron (ZOFRAN) 8 MG tablet Take 1 tablet (8 mg total) by mouth every 8  (eight) hours as needed. 01/27/14   Theodis Blaze, MD  oxyCODONE-acetaminophen (ROXICET) 5-325 MG per tablet Take 1 tablet by mouth every 4 (four) hours as needed for severe pain. 01/27/14   Theodis Blaze, MD  promethazine (PHENERGAN) 12.5 MG tablet Take 1 tablet (12.5 mg total) by mouth every 6 (six) hours as needed for nausea or vomiting. 01/27/14   Theodis Blaze, MD    Current Facility-Administered Medications  Medication Dose Route Frequency Provider Last Rate Last Dose  . 0.9 %  sodium chloride infusion   Intravenous Continuous Theodis Blaze, MD 75 mL/hr at 01/26/14 1443    . ALPRAZolam Duanne Moron) tablet 2 mg  2 mg Oral QID PRN Theodis Blaze, MD   2 mg at 01/27/14 0706  . amLODipine (NORVASC) tablet 10 mg  10 mg Oral Daily Theodis Blaze, MD   10 mg at 01/27/14 0934  . enoxaparin (LOVENOX) injection 40 mg  40 mg Subcutaneous Q24H Theodis Blaze, MD   40 mg at 01/26/14 1443  . HYDROmorphone (DILAUDID) injection 1 mg  1 mg Intravenous Q2H PRN Theodis Blaze, MD   1 mg at 01/27/14 0659  . ketorolac (TORADOL) 30 MG/ML injection 30 mg  30 mg Intravenous Q6H PRN Theodis Blaze, MD   30 mg at 01/27/14 0933  . loratadine (CLARITIN) tablet 10 mg  10 mg Oral Daily Theodis Blaze, MD   10 mg at 01/27/14 0934  . metoCLOPramide (REGLAN) tablet 10 mg  10 mg Oral Q6H PRN Theodis Blaze, MD   10 mg at 01/27/14 0825  . ondansetron (ZOFRAN) tablet 4 mg  4 mg Oral Q6H PRN Theodis Blaze, MD       Or  . ondansetron [facility]) injection 4 mg  4 mg Intravenous Q6H PRN Theodis Blaze, MD   4 mg at 01/27/14 0604  . oxyCODONE (Oxy IR/ROXICODONE) immediate release tablet 5 mg  5 mg Oral Q4H PRN Theodis Blaze, MD      . pantoprazole (PROTONIX) EC tablet 80 mg  80 mg Oral Daily Theodis Blaze, MD   80 mg at 01/27/14 0934  . potassium chloride SA (K-DUR,KLOR-CON) CR tablet 40 mEq  40 mEq Oral Once Theodis Blaze, MD        Allergies as of 01/26/2014 - Review Complete 01/26/2014  Allergen Reaction Noted  . Demerol Nausea Only 01/14/2011   . Orange fruit [citrus]  01/26/2014    Family History:  Colon Cancer  negative                           Polyps  Negative Family History  Problem Relation Age of Onset  . Arthritis    . Cancer    . Diabetes    . Cancer Maternal Grandfather     GASTRIC   History   Social History  . Marital Status: Single    Spouse Name: N/A    Number of Children: N/A  . Years of Education: N/A   Occupational History  . Not on file.   Social History Main Topics  . Smoking status: FORMER Smoker -QUIT 2 YRS AN 1 MO AGO  . Smokeless tobacco: Not on file  . Alcohol Use: No  . Drug Use: No  . Sexual Activity: Not on file     Review of Systems: PER HPI OTHERWISE ALL SYSTEMS ARE NEGATIVE.  Vitals: Blood pressure 132/84, pulse 78, temperature 98.2 F (36.8 C), temperature source Oral, resp. rate 20, height 5\' 3"  (1.6 m), weight 166 lb (75.297 kg), SpO2 98.00%.  Physical Exam: General:   Alert,  Well-developed, well-nourished, pleasant and cooperative in NAD Head:  Normocephalic and atraumatic. Eyes:  Sclera clear, no icterus.    Mouth:  No deformity or lesions, dentition ABnormal. Neck:  Supple; no masses. Lungs:  Clear throughout to auscultation.   No wheezes,. No acute distress. Heart:  Regular rate and rhythm; no murmurs. Abdomen:  Soft, nontender and nondistended. No masses noted. Normal bowel sounds, without guarding, and without rebound.   Msk:  Symmetrical without gross deformities. Normal posture. Extremities:  Without edema. Neurologic:  Alert and  oriented x4;  grossly normal neurologically. Cervical Nodes:  No significant cervical adenopathy. Skin: multiple tattos on upper extremities Psych:  Alert and cooperative. Normal mood and affect.  Lab Results:  Recent Labs  01/26/14 0938 01/27/14 0554  WBC 9.3 7.8  HGB 13.6 13.7  HCT 38.9 39.4  PLT 227 248   BMET  Recent Labs  01/26/14 0938 01/27/14 0554  NA 140 140  K 3.0* 3.4*  CL 97 100  CO2 31 29  GLUCOSE 94  93  BUN 6 6  CREATININE 0.83 0.78  CALCIUM 9.3 9.1   LFT  Recent Labs  01/26/14 0938  PROT 7.3  ALBUMIN 4.0  AST 18  ALT 21  ALKPHOS 43  BILITOT 0.4     Studies/Results: CT ABD/PELVIS W/ IVC APR 3: NAIAP   Impression: ADMITTED WITH NAUSEA/VOMITING/ABD PAIN LIKELY DUE TO VIRAL ILLNESS OR FLARE OF REFLUX. SX IMPROVED. ONE EPISODE OF HEMATEMESIS LIKLEY DUE TO EROSIVE EROSIVE ESOPHAGITIS, LESS LIKELY MALLORY WEISS TEAR OR GASTRIC CA.  Plan: 1. BID PRILOSEC 2. LOW FAT DIET 3. PREOP APR 6. EGD TUES APR 7 WITH PROPOFOL DUE TO POLYPHARMACY/FAILED CONSCIOUS SEDATION.  LOS: 1 day   [facility]  01/27/2014, 10:46 AM

## 2014-01-30 NOTE — Op Note (Signed)
[facility] [address] [place], [address]   ENDOSCOPY PROCEDURE REPORT  PATIENT: [name], [name]  MR#: #[id number] BIRTHDATE: 1981/07/31 , 33  yrs. old GENDER: Female  ENDOSCOPIST: Barney Drain, MD REFERRED KD:XIPJA Gerarda Fraction, M.D.  PROCEDURE DATE: 01/30/2014 PROCEDURE:   EGD w/ biopsy  INDICATIONS:Dyspepsia.   Hematemesis. MEDICATIONS: MAC sedation, administered by CRNA TOPICAL ANESTHETIC:   Cetacaine Spray  DESCRIPTION OF PROCEDURE:     Physical exam was performed.  Informed consent was obtained from the patient after explaining the benefits, risks, and alternatives to the procedure.  The patient was connected to the monitor and placed in the left lateral position.  Continuous oxygen was provided by nasal cannula and IV medicine administered through an indwelling cannula.  After administration of sedation, the patients esophagus was intubated and the     endoscope was advanced under direct visualization to the second portion of the duodenum.  The scope was removed slowly by carefully examining the color, texture, anatomy, and integrity of the mucosa on the way out.  The patient was recovered in endoscopy and discharged home in satisfactory condition.   ESOPHAGUS: The mucosa of the esophagus appeared normal.   STOMACH: Mild erosive gastritis (inflammation) was found in the gastric antrum.  Multiple biopsies were performed using cold forceps. DUODENUM: The duodenal mucosa showed no abnormalities in the bulb and second portion of the duodenum.  Cold forcep biopsies were taken in the second portion. COMPLICATIONS:   None  ENDOSCOPIC IMPRESSION: 1.   NO OBVIOUS SOURCE FOR HEMATEMESIS IDENTIFIED.  ETIOLOGY: ? MALLORY WEISS TEAR NOW HEALED, OR GASTRITIS 2.   MILD Erosive gastritis  RECOMMENDATIONS: CONTINUE PROTONIX.  TAKE 30 MINUTES PRIOR TO MEALS TWICE DAILY. STRICTLY FOLLOW A LOW FAT DIET. BIOPSY WILL BE BACK IN 7 DAYS.  FOLLOW UP in AUG  2015.   REPEAT EXAM:   _______________________________ Lorrin MaisBarney Drain, MD 01/30/2014 3:54 PM

## 2014-01-31 ENCOUNTER — Encounter (HOSPITAL_COMMUNITY): Payer: Self-pay | Admitting: Gastroenterology

## 2014-02-12 ENCOUNTER — Telehealth: Payer: Self-pay | Admitting: Gastroenterology

## 2014-02-12 NOTE — Telephone Encounter (Signed)
Pt was checking to see if her EGD results were back yet. [phone]

## 2014-02-13 NOTE — Telephone Encounter (Signed)
Pt called wanting to know her results to her EGD, pt said she called yesterday also to speak with Dr. Oneida Alar nurse. Pt said she has also been throwing up again and nothing is helping. Please advise [phone]

## 2014-02-13 NOTE — Telephone Encounter (Signed)
Called pt, LMOM for a return call.

## 2014-02-14 ENCOUNTER — Telehealth: Payer: Self-pay | Admitting: Gastroenterology

## 2014-02-14 NOTE — Telephone Encounter (Signed)
Mild gastritis. Path with duodenitis. NO h.pylori. Continue PPI BID.  Likely hematemesis secondary to MW tear.  If needs anti-emetics, call in Zofran 4 mg po every 8 hours, disp# 30 with no refills.  May ultimately need GES if persistent N/V.

## 2014-02-14 NOTE — Telephone Encounter (Signed)
See previous note of 02/12/2014.

## 2014-02-14 NOTE — Telephone Encounter (Signed)
I called pt and she said she was feeling a little better, not as much nausea. But she did not eat or drink much yesterday.   I told her she can dehydrate and she said she was aware of that and if she is not able to eat or drink today she will call us back. She is aware that she will need to go to the ED if she feels she has dehydrated.  She is at a doctor's visit now ( a doctor who was assigned to her) for her disability.   She will call if she needs to.

## 2014-02-14 NOTE — Telephone Encounter (Signed)
LMOM to call.

## 2014-02-14 NOTE — Telephone Encounter (Signed)
Called and informed the pt.  

## 2014-02-14 NOTE — Telephone Encounter (Signed)
Patient was returning call. Nurse not available. Please call patient back. [phone]

## 2014-02-14 NOTE — Telephone Encounter (Signed)
She said she still has some Zofran now, but if she needs any she will let us know.

## 2014-02-15 ENCOUNTER — Telehealth: Payer: Self-pay | Admitting: *Deleted

## 2014-02-15 ENCOUNTER — Encounter (HOSPITAL_COMMUNITY): Payer: Self-pay | Admitting: Emergency Medicine

## 2014-02-15 ENCOUNTER — Emergency Department (HOSPITAL_COMMUNITY)
Admission: EM | Admit: 2014-02-15 | Discharge: 2014-02-15 | Disposition: A | Discharge status: Home / Self Care | Payer: Medicaid Other | Attending: Emergency Medicine | Admitting: Emergency Medicine

## 2014-02-15 DIAGNOSIS — M549 Dorsalgia, unspecified: Secondary | ICD-10-CM | POA: Insufficient documentation

## 2014-02-15 DIAGNOSIS — R1031 Right lower quadrant pain: Secondary | ICD-10-CM | POA: Insufficient documentation

## 2014-02-15 DIAGNOSIS — I10 Essential (primary) hypertension: Secondary | ICD-10-CM | POA: Insufficient documentation

## 2014-02-15 DIAGNOSIS — R42 Dizziness and giddiness: Secondary | ICD-10-CM | POA: Insufficient documentation

## 2014-02-15 DIAGNOSIS — K219 Gastro-esophageal reflux disease without esophagitis: Secondary | ICD-10-CM | POA: Insufficient documentation

## 2014-02-15 DIAGNOSIS — R34 Anuria and oliguria: Secondary | ICD-10-CM | POA: Insufficient documentation

## 2014-02-15 DIAGNOSIS — F411 Generalized anxiety disorder: Secondary | ICD-10-CM | POA: Insufficient documentation

## 2014-02-15 DIAGNOSIS — Z90710 Acquired absence of both cervix and uterus: Secondary | ICD-10-CM | POA: Insufficient documentation

## 2014-02-15 DIAGNOSIS — Z9089 Acquired absence of other organs: Secondary | ICD-10-CM | POA: Insufficient documentation

## 2014-02-15 DIAGNOSIS — R109 Unspecified abdominal pain: Secondary | ICD-10-CM

## 2014-02-15 DIAGNOSIS — Z79899 Other long term (current) drug therapy: Secondary | ICD-10-CM | POA: Insufficient documentation

## 2014-02-15 DIAGNOSIS — R112 Nausea with vomiting, unspecified: Secondary | ICD-10-CM

## 2014-02-15 DIAGNOSIS — Z3202 Encounter for pregnancy test, result negative: Secondary | ICD-10-CM | POA: Insufficient documentation

## 2014-02-15 DIAGNOSIS — R21 Rash and other nonspecific skin eruption: Secondary | ICD-10-CM | POA: Insufficient documentation

## 2014-02-15 DIAGNOSIS — R51 Headache: Secondary | ICD-10-CM | POA: Insufficient documentation

## 2014-02-15 DIAGNOSIS — Z87891 Personal history of nicotine dependence: Secondary | ICD-10-CM | POA: Insufficient documentation

## 2014-02-15 DIAGNOSIS — R1013 Epigastric pain: Secondary | ICD-10-CM | POA: Insufficient documentation

## 2014-02-15 DIAGNOSIS — R630 Anorexia: Secondary | ICD-10-CM | POA: Insufficient documentation

## 2014-02-15 DIAGNOSIS — R197 Diarrhea, unspecified: Secondary | ICD-10-CM | POA: Insufficient documentation

## 2014-02-15 DIAGNOSIS — R1012 Left upper quadrant pain: Secondary | ICD-10-CM | POA: Insufficient documentation

## 2014-02-15 DIAGNOSIS — Z9889 Other specified postprocedural states: Secondary | ICD-10-CM | POA: Insufficient documentation

## 2014-02-15 LAB — URINALYSIS, ROUTINE W REFLEX MICROSCOPIC
GLUCOSE, UA: NEGATIVE mg/dL
LEUKOCYTES UA: NEGATIVE
Nitrite: NEGATIVE
Specific Gravity, Urine: >1.03 — ABNORMAL HIGH (ref 1.005–1.030)
UROBILINOGEN UA: 0.2 mg/dL (ref 0.0–1.0)
pH: 6 (ref 5.0–8.0)

## 2014-02-15 LAB — COMPREHENSIVE METABOLIC PANEL
ALT: 11 U/L (ref 0–35)
AST: 14 U/L (ref 0–37)
Albumin: 3.9 g/dL (ref 3.5–5.2)
Alkaline Phosphatase: 44 U/L (ref 39–117)
BUN: 10 mg/dL (ref 6–23)
CALCIUM: 9.5 mg/dL (ref 8.4–10.5)
CO2: 28 mEq/L (ref 19–32)
CREATININE: 0.74 mg/dL (ref 0.50–1.10)
Chloride: 100 mEq/L (ref 96–112)
GFR calc non Af Amer: >90 mL/min (ref >90)
Glucose, Bld: 119 mg/dL — ABNORMAL HIGH (ref 70–99)
Potassium: 3.4 mEq/L — ABNORMAL LOW (ref 3.7–5.3)
Sodium: 141 mEq/L (ref 137–147)
Total Bilirubin: 0.4 mg/dL (ref 0.3–1.2)
Total Protein: 7.1 g/dL (ref 6.0–8.3)

## 2014-02-15 LAB — URINE MICROSCOPIC-ADD ON

## 2014-02-15 LAB — CBC WITH DIFFERENTIAL/PLATELET
BASOS ABS: 0 10*3/uL (ref 0.0–0.1)
Basophils Relative: 0 % (ref 0–1)
EOS ABS: 0 10*3/uL (ref 0.0–0.7)
EOS PCT: 0 % (ref 0–5)
HEMATOCRIT: 40.6 % (ref 36.0–46.0)
Hemoglobin: 13.9 g/dL (ref 12.0–15.0)
LYMPHS PCT: 26 % (ref 12–46)
Lymphs Abs: 2 10*3/uL (ref 0.7–4.0)
MCH: 31.5 pg (ref 26.0–34.0)
MCHC: 34.2 g/dL (ref 30.0–36.0)
MCV: 92.1 fL (ref 78.0–100.0)
MONO ABS: 1.1 10*3/uL — AB (ref 0.1–1.0)
Monocytes Relative: 14 % — ABNORMAL HIGH (ref 3–12)
Neutro Abs: 4.7 10*3/uL (ref 1.7–7.7)
Neutrophils Relative %: 60 % (ref 43–77)
Platelets: 283 10*3/uL (ref 150–400)
RBC: 4.41 MIL/uL (ref 3.87–5.11)
RDW: 12.2 % (ref 11.5–15.5)
WBC: 7.8 10*3/uL (ref 4.0–10.5)

## 2014-02-15 LAB — LIPASE, BLOOD: Lipase: 23 U/L (ref 11–59)

## 2014-02-15 LAB — PREGNANCY, URINE: PREG TEST UR: NEGATIVE

## 2014-02-15 LAB — WET PREP, GENITAL: Trich, Wet Prep: NONE SEEN

## 2014-02-15 LAB — POC OCCULT BLOOD, ED: Fecal Occult Bld: NEGATIVE

## 2014-02-15 MED ORDER — SODIUM CHLORIDE 0.9 % IV SOLN
INTRAVENOUS | Status: DC
  Administered 2014-02-15: 10:00:00 via INTRAVENOUS

## 2014-02-15 MED ORDER — METOCLOPRAMIDE HCL 5 MG/ML IJ SOLN
10.0000 mg | Freq: Once | INTRAMUSCULAR | Status: AC
  Administered 2014-02-15: 10 mg via INTRAVENOUS
  Filled 2014-02-15: qty 2

## 2014-02-15 MED ORDER — FAMOTIDINE IN NACL 20-0.9 MG/50ML-% IV SOLN
20.0000 mg | Freq: Once | INTRAVENOUS | Status: AC
  Administered 2014-02-15: 20 mg via INTRAVENOUS
  Filled 2014-02-15: qty 50

## 2014-02-15 MED ORDER — HYDROMORPHONE HCL PF 1 MG/ML IJ SOLN
1.0000 mg | Freq: Once | INTRAMUSCULAR | Status: AC
  Administered 2014-02-15: 1 mg via INTRAVENOUS
  Filled 2014-02-15: qty 1

## 2014-02-15 MED ORDER — PROMETHAZINE HCL 25 MG/ML IJ SOLN
12.5000 mg | Freq: Once | INTRAMUSCULAR | Status: AC
Start: 2014-02-15 — End: 2014-02-15
  Administered 2014-02-15: 12.5 mg via INTRAVENOUS
  Filled 2014-02-15: qty 1

## 2014-02-15 MED ORDER — SODIUM CHLORIDE 0.9 % IV SOLN
1000.0000 mL | Freq: Once | INTRAVENOUS | Status: AC
  Administered 2014-02-15: 1000 mL via INTRAVENOUS

## 2014-02-15 NOTE — Telephone Encounter (Signed)
Pt had a female call for her and per female pt is in the ER at [facility], per female pt stated Dr. Oneida Alar told her if she does not get any better to go to the ER. Pt wanted Dr. Oneida Alar aware that she is in the ER.

## 2014-02-15 NOTE — Discharge Instructions (Signed)
YOur blood work today was normal. Your stool exam was negative for blood. Your pelvic exam was normal. Follow up with your GI doctor. Return here as needed.

## 2014-02-15 NOTE — ED Notes (Signed)
C/o vomiting and abdominal pain since Tuesday.  Was admitted with same 2 weeks ago and is seeing GI Dr. Oneida Alar.  Had an EGD, and was told her stomach and duodenum  Were inflammed.  Was also told she had gastritis.

## 2014-02-15 NOTE — Telephone Encounter (Signed)
Routing to Dr. Fields.  

## 2014-02-15 NOTE — ED Provider Notes (Signed)
CSN: [id number]     Arrival date & time 02/15/14  0844 History   First MD Initiated Contact with Patient 02/15/14 [phone]     Chief Complaint  Patient presents with  . Abdominal Pain  . Emesis     (Consider location/radiation/quality/duration/timing/severity/associated sxs/prior Treatment) Patient is a 33 y.o. female presenting with abdominal pain. The history is provided by the patient.  Abdominal Pain Pain location:  Generalized Pain quality: aching, bloating and sharp   Pain radiates to:  Back Pain severity:  Severe Onset quality:  Gradual Duration:  1 week Timing:  Constant Progression:  Worsening Chronicity:  Recurrent Context: recent illness   Relieved by:  Nothing Worsened by:  Eating Ineffective treatments:  Antacids (Rx medications) Associated symptoms: anorexia, diarrhea, nausea and vomiting   Associated symptoms: no chills, no cough, no fever, no flatus, no shortness of breath, no vaginal bleeding and no vaginal discharge  Chest pain: sore with vomiting.   Risk factors: recent hospitalization   Risk factors: no alcohol abuse, no aspirin use, no NSAID use, not obese and not pregnant    [name] is a 33 y.o. female who presents to the ED with abdominal pain, nausea and vomiting that started a week ago and has gotten worse. She was hospitalized 01/26/2014 for same symptoms. She was evaluated by GI and told she had gastritis and inflammation of the bowel. She was treated Protonix, Zofran and Reglan. She has not been able to keep the medications down. She has had a weight loss of eight pounds in the past week.   Past Medical History  Diagnosis Date  . Anxiety   . Acid reflux   . Hypertension    Past Surgical History  Procedure Laterality Date  . Ectopic pregnancy surgery    . Cholecystectomy    . Abdominal hysterectomy    . Cesarean section      x2  . Arm surgery Left     plate in arm  . Leg surgery Left     femur rodding and removal of part of left femur  .  Bone biopsy Right     thumb  . Brain surgery      evacuation of hematoma  . Esophagogastroduodenoscopy (egd) with propofol N/A 01/30/2014    Procedure: ESOPHAGOGASTRODUODENOSCOPY (EGD) WITH PROPOFOL;  Surgeon: Danie Binder, MD;  Location: [facility];  Service: Endoscopy;  Laterality: N/A;   Family History  Problem Relation Age of Onset  . Arthritis    . Cancer    . Diabetes    . Cancer Maternal Grandfather     GASTRIC   History  Substance Use Topics  . Smoking status: Former Smoker -- 1.00 packs/day for 20 years    Types: Cigarettes    Quit date: 12/02/2011  . Smokeless tobacco: Not on file  . Alcohol Use: No   OB History   Grav Para Term Preterm Abortions TAB SAB Ect Mult Living                 Review of Systems  Constitutional: Negative for fever and chills.  HENT: Negative.   Eyes: Negative for redness and visual disturbance.  Respiratory: Negative for cough, shortness of breath and wheezing.   Cardiovascular: Chest pain: sore with vomiting.  Gastrointestinal: Positive for nausea, vomiting, abdominal pain, diarrhea and anorexia. Negative for flatus.  Genitourinary: Positive for decreased urine volume. Negative for urgency, frequency, vaginal bleeding and vaginal discharge.  Musculoskeletal: Positive for back pain.  Skin: Positive  for rash (on chest that is chronic).  Neurological: Positive for light-headedness. Negative for syncope.  Psychiatric/Behavioral: The patient is not nervous/anxious.       Allergies  Demerol and Orange fruit  Home Medications   Prior to Admission medications   Medication Sig Start Date End Date Taking? Authorizing Provider  alprazolam Duanne Moron) 2 MG tablet Take 1 tablet (2 mg total) by mouth 4 (four) times daily. 01/28/14   Theodis Blaze, MD  amLODipine (NORVASC) 10 MG tablet Take 10 mg by mouth daily.    Historical Provider, MD  HYDROmorphone (DILAUDID) 2 MG tablet Take 1 tablet (2 mg total) by mouth every 4 (four) hours as needed for severe  pain. 01/28/14   Theodis Blaze, MD  loratadine (CLARITIN) 10 MG tablet Take 10 mg by mouth daily.    Historical Provider, MD  metoCLOPramide (REGLAN) 10 MG tablet Take 1 tablet (10 mg total) by mouth every 6 (six) hours as needed for nausea (nausea/headache). 01/24/14   Sharyon Cable, MD  ondansetron (ZOFRAN) 8 MG tablet Take 1 tablet (8 mg total) by mouth every 8 (eight) hours as needed. 01/28/14   Theodis Blaze, MD  pantoprazole (PROTONIX) 40 MG tablet Take 1 tablet (40 mg total) by mouth 2 (two) times daily before a meal. 01/28/14   Theodis Blaze, MD  promethazine (PHENERGAN) 12.5 MG suppository Place 2 suppositories (25 mg total) rectally every 6 (six) hours as needed for nausea or vomiting. 01/28/14   Theodis Blaze, MD  BP 126/76  Pulse 83  Temp(Src) 98.8 F (37.1 C) (Oral)  Resp 20  Ht 5\' 3"  (1.6 m)  Wt 160 lb 8 oz (72.802 kg)  BMI 28.44 kg/m2  SpO2 99%  Physical Exam  Nursing note and vitals reviewed. Constitutional: She is oriented to person, place, and time. She appears well-developed and well-nourished.  HENT:  Head: Normocephalic.  Eyes: Conjunctivae and EOM are normal.  Neck: Normal range of motion. Neck supple.  Cardiovascular: Normal rate and regular rhythm.   Pulmonary/Chest: Effort normal and breath sounds normal.  Abdominal: Soft. Bowel sounds are normal. There is tenderness in the epigastric area, left upper quadrant and left lower quadrant. There is no rebound, no guarding and no CVA tenderness.  Genitourinary: Rectal exam shows no external hemorrhoid, no fissure, no mass, no tenderness and anal tone normal. Guaiac negative stool.  External genitalia without lesions, white discharge vaginal vault, no CMT, no adnexal tenderness, uterus without palpable enlargement.   Musculoskeletal: Normal range of motion.  Neurological: She is alert and oriented to person, place, and time. No cranial nerve deficit.  Skin: Skin is warm and dry.  Psychiatric: She has a normal mood and  affect. Her behavior is normal.   Results for orders placed during the hospital encounter of 02/15/14 (from the past 24 hour(s))  CBC WITH DIFFERENTIAL     Status: Abnormal   Collection Time    02/15/14  9:51 AM      Result Value Ref Range   WBC 7.8  4.0 - 10.5 K/uL   RBC 4.41  3.87 - 5.11 MIL/uL   Hemoglobin 13.9  12.0 - 15.0 g/dL   HCT 40.6  36.0 - 46.0 %   MCV 92.1  78.0 - 100.0 fL   MCH 31.5  26.0 - 34.0 pg   MCHC 34.2  30.0 - 36.0 g/dL   RDW 12.2  11.5 - 15.5 %   Platelets 283  150 - 400 K/uL  Neutrophils Relative % 60  43 - 77 %   Neutro Abs 4.7  1.7 - 7.7 K/uL   Lymphocytes Relative 26  12 - 46 %   Lymphs Abs 2.0  0.7 - 4.0 K/uL   Monocytes Relative 14 (*) 3 - 12 %   Monocytes Absolute 1.1 (*) 0.1 - 1.0 K/uL   Eosinophils Relative 0  0 - 5 %   Eosinophils Absolute 0.0  0.0 - 0.7 K/uL   Basophils Relative 0  0 - 1 %   Basophils Absolute 0.0  0.0 - 0.1 K/uL  COMPREHENSIVE METABOLIC PANEL     Status: Abnormal   Collection Time    02/15/14  9:51 AM      Result Value Ref Range   Sodium 141  137 - 147 mEq/L   Potassium 3.4 (*) 3.7 - 5.3 mEq/L   Chloride 100  96 - 112 mEq/L   CO2 28  19 - 32 mEq/L   Glucose, Bld 119 (*) 70 - 99 mg/dL   BUN 10  6 - 23 mg/dL   Creatinine, Ser 0.74  0.50 - 1.10 mg/dL   Calcium 9.5  8.4 - 10.5 mg/dL   Total Protein 7.1  6.0 - 8.3 g/dL   Albumin 3.9  3.5 - 5.2 g/dL   AST 14  0 - 37 U/L   ALT 11  0 - 35 U/L   Alkaline Phosphatase 44  39 - 117 U/L   Total Bilirubin 0.4  0.3 - 1.2 mg/dL   GFR calc non Af Amer >90  >90 mL/min   GFR calc Af Amer >90  >90 mL/min  LIPASE, BLOOD     Status: None   Collection Time    02/15/14  9:51 AM      Result Value Ref Range   Lipase 23  11 - 59 U/L  URINALYSIS, ROUTINE W REFLEX MICROSCOPIC     Status: Abnormal   Collection Time    02/15/14 10:22 AM      Result Value Ref Range   Color, Urine YELLOW  YELLOW   APPearance CLEAR  CLEAR   Specific Gravity, Urine >1.030 (*) 1.005 - 1.030   pH 6.0  5.0 - 8.0    Glucose, UA NEGATIVE  NEGATIVE mg/dL   Hgb urine dipstick MODERATE (*) NEGATIVE   Bilirubin Urine SMALL (*) NEGATIVE   Ketones, ur TRACE (*) NEGATIVE mg/dL   Protein, ur TRACE (*) NEGATIVE mg/dL   Urobilinogen, UA 0.2  0.0 - 1.0 mg/dL   Nitrite NEGATIVE  NEGATIVE   Leukocytes, UA NEGATIVE  NEGATIVE  PREGNANCY, URINE     Status: None   Collection Time    02/15/14 10:22 AM      Result Value Ref Range   Preg Test, Ur NEGATIVE  NEGATIVE  URINE MICROSCOPIC-ADD ON     Status: Abnormal   Collection Time    02/15/14 10:22 AM      Result Value Ref Range   Squamous Epithelial / LPF FEW (*) RARE   WBC, UA 0-2  <3 WBC/hpf   RBC / HPF 3-6  <3 RBC/hpf   Bacteria, UA FEW (*) RARE   Urine-Other YEAST    WET PREP, GENITAL     Status: Abnormal   Collection Time    02/15/14 11:48 AM      Result Value Ref Range   Yeast Wet Prep HPF POC FEW (*) NONE SEEN   Trich, Wet Prep NONE SEEN  NONE SEEN  Clue Cells Wet Prep HPF POC FEW (*) NONE SEEN   WBC, Wet Prep HPF POC FEW (*) NONE SEEN  POC OCCULT BLOOD, ED     Status: None   Collection Time    02/15/14 12:35 PM      Result Value Ref Range   Fecal Occult Bld NEGATIVE  NEGATIVE    ED Course  Procedures IV hydration, Reglan, Dilaudid.  MDM 33 y.o. female with nausea, vomiting and left side abdominal pain. She was tender on palpation of the abdomen however, on pelvic exam there was no tenderness in the left adnexa.  She does have hematuria and her urine was sent for culture. Stable for discharge without nausea or pain at this time.  I have reviewed this patient's vital signs, nurses notes, appropriate labs and imaging.  I have discussed findings and plan of care with the patient and she voices understanding. She will call her GI doctor for follow up. She will return here as needed. She has medication for nausea and pain at home.       [facility] Bunnie Pion, [place] 02/15/14 1455

## 2014-02-16 LAB — GC/CHLAMYDIA PROBE AMP
CT Probe RNA: NEGATIVE
GC PROBE AMP APTIMA: NEGATIVE

## 2014-02-17 LAB — URINE CULTURE: Colony Count: 5000

## 2014-02-17 NOTE — ED Provider Notes (Signed)
Medical screening examination/treatment/procedure(s) were performed by non-physician practitioner and as supervising physician I was immediately available for consultation/collaboration.   EKG Interpretation None       Nat Christen, MD 02/17/14 [phone]

## 2014-02-19 NOTE — Telephone Encounter (Signed)
Reminder in epic °

## 2014-02-19 NOTE — Telephone Encounter (Signed)
Pt aware of results 

## 2014-02-19 NOTE — Telephone Encounter (Signed)
Please call pt. HER stomach Bx shows gastritis AND DUODENITIS.  HER H PYLORI INFECTION IS GONE. HER CHRONIC ABDOMINAL PAIN IS MOST LIKELY DUE TO STRESS/anxiety with  irritable bowel syndrome,AND reflux disease. HER ABDOMINAL PAIN CAN BE WORSE IF SHE EATS CERTAIN FOODS SINCE HER GALLBLADDER HAS BEEN REMOVED.  CONTINUE PROTONIX. TAKE 30 MINUTES PRIOR TO MEALS TWICE DAILY. STRICTLY FOLLOW A LOW FAT DIET. IF SHE EATS HIGH FAT FOODS SHE WILL HAVE ABDOMINAL PAIN AND DIARRHEA. OPV IN AUG 2015 E30 ABD PAIN

## 2014-03-15 ENCOUNTER — Encounter: Payer: Self-pay | Admitting: *Deleted

## 2014-03-21 ENCOUNTER — Other Ambulatory Visit (HOSPITAL_COMMUNITY)
Admission: RE | Admit: 2014-03-21 | Discharge: 2014-03-21 | Disposition: A | Discharge status: Home / Self Care | Payer: Medicaid Other | Source: Ambulatory Visit | Attending: Obstetrics and Gynecology | Admitting: Obstetrics and Gynecology

## 2014-03-21 ENCOUNTER — Encounter: Payer: Self-pay | Admitting: Obstetrics and Gynecology

## 2014-03-21 ENCOUNTER — Ambulatory Visit (INDEPENDENT_AMBULATORY_CARE_PROVIDER_SITE_OTHER): Payer: Medicaid Other | Admitting: Obstetrics and Gynecology

## 2014-03-21 VITALS — BP 120/80 | Ht 63.0 in | Wt 173.0 lb

## 2014-03-21 DIAGNOSIS — N920 Excessive and frequent menstruation with regular cycle: Secondary | ICD-10-CM | POA: Insufficient documentation

## 2014-03-21 DIAGNOSIS — Z113 Encounter for screening for infections with a predominantly sexual mode of transmission: Secondary | ICD-10-CM | POA: Insufficient documentation

## 2014-03-21 DIAGNOSIS — Z1151 Encounter for screening for human papillomavirus (HPV): Secondary | ICD-10-CM | POA: Insufficient documentation

## 2014-03-21 DIAGNOSIS — IMO0002 Reserved for concepts with insufficient information to code with codable children: Secondary | ICD-10-CM

## 2014-03-21 DIAGNOSIS — R87810 Cervical high risk human papillomavirus (HPV) DNA test positive: Secondary | ICD-10-CM | POA: Insufficient documentation

## 2014-03-21 DIAGNOSIS — Z124 Encounter for screening for malignant neoplasm of cervix: Secondary | ICD-10-CM

## 2014-03-21 DIAGNOSIS — Z01419 Encounter for gynecological examination (general) (routine) without abnormal findings: Secondary | ICD-10-CM | POA: Insufficient documentation

## 2014-03-21 NOTE — Progress Notes (Signed)
    [facility] Visit  Patient name: DONALEE [name] MRN [id number]  Date of birth: 07-May-1981  CC & HPI:  [name] is a 33 y.o. female presenting today for period problems: pain that starts the first day of her cycle and continues through out. Pt states that she has passed large clots, and very heavy cycles. Periods last about 5 days with variable bleeding. Pt states that she uses a super plus tampon and uses a box of 18-24 in 2 days. Pt states that she also has pain with intercourse with certain positions.  Pt needs something done before the end of July.  Pt was informed about ablation and hysterectomy and the difference between the two and what they help.   ROS:    Pertinent History Reviewed:  Medical & Surgical Hx:  Reviewed: Significant for salpingectomy x 2 for ectopic s/p cesarean x 2  has some skin laxity s/p pregnancies Medications: Reviewed & Updated - see associated section Social History: Reviewed -  reports that she quit smoking about 2 years ago. Her smoking use included Cigarettes. She has a 20 pack-year smoking history. She has never used smokeless tobacco.  Objective Findings:  Vitals: BP 120/80  Ht 5\' 3"  (1.6 m)  Wt 173 lb (78.472 kg)  BMI 30.65 kg/m2  Physical Examination: Physical Examination: General appearance - alert, well appearing, and in no distress, oriented to person, place, and time and moderately uncomfortable with exam Mental status - alert, oriented to person, place, and time, normal mood, behavior, speech, dress, motor activity, and thought processes Abdomen - soft, nontender, nondistended, no masses or organomegaly scars from previous incisions pfannensteil Pelvic - VULVA: normal appearing vulva with no masses, tenderness or lesions, VAGINA: normal appearing vagina with normal color and discharge, no lesions, PELVIC FLOOR EXAM: no cystocele, rectocele or prolapse noted, the uterus does descend enough to consider vag surgery , CERVIX: normal  appearing cervix without discharge or lesions, DNA probe for chlamydia and GC obtained, UTERUS: tenderness to contact, reproduces dyspareunia, anteverted, mobile , ADNEXA: tenderness right, and slight fullness on rt, will u/s Extremities - peripheral pulses normal, no pedal edema, no clubbing or cyanosis, no pedal edema noted  Assessment & Plan:   Pap with GC/CHL done today. Schedule u/s Return to discuss options Pt might prefer abd hyst with removal of skin laxity.  Right adnexal discomfort needs eval by u/s

## 2014-03-21 NOTE — Progress Notes (Signed)
Patient ID: [name], female   DOB: 18-Feb-1981, 33 y.o.   MRN: [id number] Pt states that the past 2 years she has been very heavy for the first few days of her cycle, and passing large clots. Pt states that she knows that she will probably need a complete hysterectomy. Pt has already had her tubes removed, years ago. Pt states that she is having so much pain and that she can't take it anymore.

## 2014-03-21 NOTE — Patient Instructions (Signed)
U/s this office in a week

## 2014-03-26 ENCOUNTER — Other Ambulatory Visit: Payer: Self-pay | Admitting: Obstetrics and Gynecology

## 2014-03-26 ENCOUNTER — Telehealth: Payer: Self-pay | Admitting: *Deleted

## 2014-03-26 DIAGNOSIS — R102 Pelvic and perineal pain: Secondary | ICD-10-CM

## 2014-03-26 NOTE — Telephone Encounter (Signed)
Pt states has continued to have abdominal pain since last visit. Is this normal? She states she takes Norco for the pain and this does help relieve the pain. Informed pt to keep her appt on Wednesday for u/s and Dr. Glo Herring. The ultrasound will give Dr. Glo Herring more information in regards to what could be the source of pain. Pt verbalized understanding.

## 2014-03-28 ENCOUNTER — Encounter: Payer: Self-pay | Admitting: Radiology

## 2014-03-28 ENCOUNTER — Ambulatory Visit (INDEPENDENT_AMBULATORY_CARE_PROVIDER_SITE_OTHER): Payer: Medicaid Other | Admitting: Obstetrics and Gynecology

## 2014-03-28 ENCOUNTER — Ambulatory Visit (INDEPENDENT_AMBULATORY_CARE_PROVIDER_SITE_OTHER): Payer: Medicaid Other

## 2014-03-28 VITALS — BP 110/64 | Ht 63.0 in | Wt 166.0 lb

## 2014-03-28 DIAGNOSIS — R102 Pelvic and perineal pain: Secondary | ICD-10-CM

## 2014-03-28 DIAGNOSIS — N949 Unspecified condition associated with female genital organs and menstrual cycle: Secondary | ICD-10-CM

## 2014-03-28 DIAGNOSIS — N946 Dysmenorrhea, unspecified: Secondary | ICD-10-CM

## 2014-03-28 NOTE — Patient Instructions (Signed)
Levonorgestrel intrauterine device (IUD) What is this medicine? LEVONORGESTREL IUD (LEE voe nor jes trel) is a contraceptive (birth control) device. The device is placed inside the uterus by a healthcare professional. It is used to prevent pregnancy and can also be used to treat heavy bleeding that occurs during your period. Depending on the device, it can be used for 3 to 5 years. This medicine may be used for other purposes; ask your health care provider or pharmacist if you have questions. COMMON BRAND NAME(S): Mirena, Skyla What should I tell my health care provider before I take this medicine? They need to know if you have any of these conditions: -abnormal Pap smear -cancer of the breast, uterus, or cervix -diabetes -endometritis -genital or pelvic infection now or in the past -have more than one sexual partner or your partner has more than one partner -heart disease -history of an ectopic or tubal pregnancy -immune system problems -IUD in place -liver disease or tumor -problems with blood clots or take blood-thinners -use intravenous drugs -uterus of unusual shape -vaginal bleeding that has not been explained -an unusual or allergic reaction to levonorgestrel, other hormones, silicone, or polyethylene, medicines, foods, dyes, or preservatives -pregnant or trying to get pregnant -breast-feeding How should I use this medicine? This device is placed inside the uterus by a health care professional. Talk to your pediatrician regarding the use of this medicine in children. Special care may be needed. Overdosage: If you think you have taken too much of this medicine contact a poison control center or emergency room at once. NOTE: This medicine is only for you. Do not share this medicine with others. What if I miss a dose? This does not apply. What may interact with this medicine? Do not take this medicine with any of the following  medications: -amprenavir -bosentan -fosamprenavir This medicine may also interact with the following medications: -aprepitant -barbiturate medicines for inducing sleep or treating seizures -bexarotene -griseofulvin -medicines to treat seizures like carbamazepine, ethotoin, felbamate, oxcarbazepine, phenytoin, topiramate -modafinil -pioglitazone -rifabutin -rifampin -rifapentine -some medicines to treat HIV infection like atazanavir, indinavir, lopinavir, nelfinavir, tipranavir, ritonavir -St. Derionna Salvador's wort -warfarin This list may not describe all possible interactions. Give your health care provider a list of all the medicines, herbs, non-prescription drugs, or dietary supplements you use. Also tell them if you smoke, drink alcohol, or use illegal drugs. Some items may interact with your medicine. What should I watch for while using this medicine? Visit your doctor or health care professional for regular check ups. See your doctor if you or your partner has sexual contact with others, becomes HIV positive, or gets a sexual transmitted disease. This product does not protect you against HIV infection (AIDS) or other sexually transmitted diseases. You can check the placement of the IUD yourself by reaching up to the top of your vagina with clean fingers to feel the threads. Do not pull on the threads. It is a good habit to check placement after each menstrual period. Call your doctor right away if you feel more of the IUD than just the threads or if you cannot feel the threads at all. The IUD may come out by itself. You may become pregnant if the device comes out. If you notice that the IUD has come out use a backup birth control method like condoms and call your health care provider. Using tampons will not change the position of the IUD and are okay to use during your period. What side effects may I   notice from receiving this medicine? Side effects that you should report to your doctor or  health care professional as soon as possible: -allergic reactions like skin rash, itching or hives, swelling of the face, lips, or tongue -fever, flu-like symptoms -genital sores -high blood pressure -no menstrual period for 6 weeks during use -pain, swelling, warmth in the leg -pelvic pain or tenderness -severe or sudden headache -signs of pregnancy -stomach cramping -sudden shortness of breath -trouble with balance, talking, or walking -unusual vaginal bleeding, discharge -yellowing of the eyes or skin Side effects that usually do not require medical attention (report to your doctor or health care professional if they continue or are bothersome): -acne -breast pain -change in sex drive or performance -changes in weight -cramping, dizziness, or faintness while the device is being inserted -headache -irregular menstrual bleeding within first 3 to 6 months of use -nausea This list may not describe all possible side effects. Call your doctor for medical advice about side effects. You may report side effects to FDA at 1-800-FDA-1088. Where should I keep my medicine? This does not apply. NOTE: This sheet is a summary. It may not cover all possible information. If you have questions about this medicine, talk to your doctor, pharmacist, or health care provider.  2014, Elsevier/Gold Standard. (2011-11-12 13:54:04)  

## 2014-03-28 NOTE — Progress Notes (Signed)
This chart was scribed by Ludger Nutting, Medical Scribe, for Dr. Mallory Shirk on 03/28/14 at 11:22 AM. This chart was reviewed by Dr. Mallory Shirk for accuracy.   [facility] Visit  Patient name: [name] MRN [id number]  Date of birth: 1981-05-30  CC & HPI:  [name] is a 33 y.o. female presenting today for ultrasound and follow up.   ROS:  +lower abdominal pain   Pertinent History Reviewed:  Medical & Surgical Hx:  Reviewed: Significant for bilateral salpingectomy, c-section x2  Past Medical History  Diagnosis Date  . Anxiety   . Acid reflux   . Hypertension     Medications: Reviewed & Updated - see associated section Social History: Reviewed -  reports that she quit smoking about 2 years ago. Her smoking use included Cigarettes. She has a 20 pack-year smoking history. She has never used smokeless tobacco.  Objective Findings:  Vitals: BP 110/64  Ht 5\' 3"  (1.6 m)  Wt 166 lb (75.297 kg)  BMI 29.41 kg/m2  LMP 03/27/2014  Physical Examination: General appearance - alert, well appearing, and in no distress and oriented to person, place, and time Pelvic - examination not indicated  Ultrasound Report:  Anteverted uterus noted, Endom-3.9mm, bilateral adnexa/ovaries appear WNL no free fluid or adnexal masse noted within the pelvis, pt c/o tenderness throughout exam   Assessment & Plan:   A: 1. Dysmenorrhea  P: 1. Mirena IUD in 1 week.

## 2014-04-04 ENCOUNTER — Ambulatory Visit (INDEPENDENT_AMBULATORY_CARE_PROVIDER_SITE_OTHER): Payer: Medicaid Other | Admitting: Advanced Practice Midwife

## 2014-04-04 ENCOUNTER — Encounter: Payer: Self-pay | Admitting: Advanced Practice Midwife

## 2014-04-04 VITALS — BP 110/72 | Ht 63.0 in | Wt 160.0 lb

## 2014-04-04 DIAGNOSIS — G8929 Other chronic pain: Secondary | ICD-10-CM | POA: Insufficient documentation

## 2014-04-04 DIAGNOSIS — Z3202 Encounter for pregnancy test, result negative: Secondary | ICD-10-CM

## 2014-04-04 DIAGNOSIS — I10 Essential (primary) hypertension: Secondary | ICD-10-CM | POA: Insufficient documentation

## 2014-04-04 DIAGNOSIS — F419 Anxiety disorder, unspecified: Secondary | ICD-10-CM | POA: Insufficient documentation

## 2014-04-04 DIAGNOSIS — Z30430 Encounter for insertion of intrauterine contraceptive device: Secondary | ICD-10-CM

## 2014-04-04 LAB — POCT URINE PREGNANCY: PREG TEST UR: NEGATIVE

## 2014-04-04 NOTE — Progress Notes (Signed)
[name] is a 33 y.o. year old Caucasian female who has had a BSO. She presents for placement of a Mirena IUD D/T menorrhagia. Her pregnancy test today is negative.   Filed Vitals:   04/04/14 1445  BP: 110/72    The risks and benefits of the method and placement have been thouroughly reviewed with the patient and all questions were answered.  Specifically the patient is aware of failure rate of 10/998, expulsion of the IUD and of possible perforation.  The patient is aware of irregular bleeding due to the method and understands the incidence of irregular bleeding diminishes with time.  Time out was performed.  A Graves speculum was placed.  The cervix was prepped using Betadine. The uterus was found to be anteroflexed and it sounded to 7 cm.  The cervix was grasped with a tenaculum and the IUD was inserted to 7 cm.  It was pulled back 1 cm and the IUD was disengaged.  The strings were trimmed to 3 cm.  Sonogram was performed and the proper placement of the IUD was verified.  The patient was instructed on signs and symptoms of infection and to check for the strings after each menses or each month.  The patient is to refrain from intercourse for 3 days.  The patient is scheduled for a return appointment after her first menses or 4 weeks.  CRESENZO-DISHMAN,Tannisha Kennington 04/04/2014 2:48 PM

## 2014-04-23 DIAGNOSIS — Z029 Encounter for administrative examinations, unspecified: Secondary | ICD-10-CM

## 2014-05-02 ENCOUNTER — Ambulatory Visit: Payer: Medicaid Other | Admitting: Advanced Practice Midwife

## 2014-06-27 ENCOUNTER — Ambulatory Visit (INDEPENDENT_AMBULATORY_CARE_PROVIDER_SITE_OTHER): Payer: Medicaid Other | Admitting: Advanced Practice Midwife

## 2014-06-27 ENCOUNTER — Encounter: Payer: Self-pay | Admitting: Advanced Practice Midwife

## 2014-06-27 VITALS — BP 154/90 | Ht 62.0 in | Wt 154.0 lb

## 2014-06-27 DIAGNOSIS — Z30431 Encounter for routine checking of intrauterine contraceptive device: Secondary | ICD-10-CM | POA: Insufficient documentation

## 2014-06-27 NOTE — Progress Notes (Signed)
[facility] Visit  Patient name: [name] MRN [id number]  Date of birth: Dec 06, 1980  CC & HPI:  [name] is a 33 y.o. Caucasian female presenting today for IUD check. She had mirena placed in June for menorrhagia.  Husband says he can feel the strings. Is not having monthly periods and cramps are much better!  Pertinent History Reviewed:  Medical & Surgical Hx:   Past Medical History  Diagnosis Date  . Anxiety   . Acid reflux   . Hypertension    Past Surgical History  Procedure Laterality Date  . Ectopic pregnancy surgery    . Cholecystectomy    . Bilateral salpingectomy    . Cesarean section      x2  . Arm surgery Left     plate in arm  . Leg surgery Left     femur rodding and removal of part of left femur  . Bone biopsy Right     thumb  . Brain surgery      evacuation of hematoma  . Esophagogastroduodenoscopy (egd) with propofol N/A 01/30/2014    Procedure: ESOPHAGOGASTRODUODENOSCOPY (EGD) WITH PROPOFOL;  Surgeon: Danie Binder, MD;  Location: [facility];  Service: Endoscopy;  Laterality: N/A;   Medications: Reviewed & Updated - see associated section Social History: Reviewed -  reports that she quit smoking about 2 years ago. Her smoking use included Cigarettes. She has a 20 pack-year smoking history. She has never used smokeless tobacco.  Objective Findings:  Vitals: BP 154/90  Ht 5\' 2"  (1.575 m)  Wt 154 lb (69.854 kg)  BMI 28.16 kg/m2  Physical Examination: General appearance - alert, well appearing, and in no distress Mental status - alert, oriented to person, place, and time Pelvic - US confirms proper placement of IUD. SSE:  Strings wrapped nicely around the cx  No results found for this or any previous visit (from the past 24 hour(s)).   Assessment & Plan:  A:   IUD check P:   F/U prn    CRESENZO-DISHMAN,Mayer Vondrak CNM 06/27/2014 11:08 AM

## 2014-08-27 ENCOUNTER — Encounter: Payer: Self-pay | Admitting: Advanced Practice Midwife

## 2015-07-11 ENCOUNTER — Other Ambulatory Visit (INDEPENDENT_AMBULATORY_CARE_PROVIDER_SITE_OTHER): Payer: Medicaid Other

## 2015-07-11 ENCOUNTER — Encounter: Payer: Self-pay | Admitting: Advanced Practice Midwife

## 2015-07-11 ENCOUNTER — Ambulatory Visit (INDEPENDENT_AMBULATORY_CARE_PROVIDER_SITE_OTHER): Payer: Medicaid Other | Admitting: Advanced Practice Midwife

## 2015-07-11 DIAGNOSIS — N946 Dysmenorrhea, unspecified: Secondary | ICD-10-CM

## 2015-07-11 DIAGNOSIS — N939 Abnormal uterine and vaginal bleeding, unspecified: Secondary | ICD-10-CM

## 2015-07-11 DIAGNOSIS — Z30432 Encounter for removal of intrauterine contraceptive device: Secondary | ICD-10-CM

## 2015-07-11 NOTE — Progress Notes (Signed)
  [name] 34 y.o.  Filed Vitals:   07/11/15 1319  BP: 100/60   Past Medical History  Diagnosis Date  . Anxiety   . Acid reflux   . Hypertension    Past Surgical History  Procedure Laterality Date  . Ectopic pregnancy surgery    . Cholecystectomy    . Bilateral salpingectomy    . Cesarean section      x2  . Arm surgery Left     plate in arm  . Leg surgery Left     femur rodding and removal of part of left femur  . Bone biopsy Right     thumb  . Brain surgery      evacuation of hematoma  . Esophagogastroduodenoscopy (egd) with propofol N/A 01/30/2014    Procedure: ESOPHAGOGASTRODUODENOSCOPY (EGD) WITH PROPOFOL;  Surgeon: Danie Binder, MD;  Location: [facility];  Service: Endoscopy;  Laterality: N/A;   family history includes Arthritis in an other family member; Cancer in her maternal grandfather and another family member; Diabetes in her mother, son, and another family member; Mental illness in her mother; Obesity in her mother and sister.  Current outpatient prescriptions:  .  alprazolam (XANAX) 2 MG tablet, Take 1 tablet (2 mg total) by mouth 4 (four) times daily., Disp: 30 tablet, Rfl: 0 .  amLODipine (NORVASC) 10 MG tablet, Take 10 mg by mouth daily., Disp: , Rfl:  .  HYDROcodone-acetaminophen (NORCO) 10-325 MG per tablet, Take 1 tablet by mouth every 6 (six) hours as needed for moderate pain., Disp: , Rfl:  .  levonorgestrel (MIRENA) 20 MCG/24HR IUD, 1 each by Intrauterine route once., Disp: , Rfl:  .  loratadine (CLARITIN) 10 MG tablet, Take 10 mg by mouth daily., Disp: , Rfl:  .  ondansetron (ZOFRAN) 8 MG tablet, Take 1 tablet (8 mg total) by mouth every 8 (eight) hours as needed., Disp: 60 tablet, Rfl: 0 .  pantoprazole (PROTONIX) 40 MG tablet, Take 1 tablet (40 mg total) by mouth 2 (two) times daily before a meal., Disp: 60 tablet, Rfl: 1    Here for IUD removal.  She had the Mirena IUD placed a year ago for cramps/bleeding.( Had salpingectomy for [facility]).  3 months  ago, she strted having pai after sex, cramps all the time, and spotting all the time.   SSE:  Normal appearing dc. Wet prep neg.  A graves speculum was placed, and only one of the the strings were visible.  It was grasped with a curved Claiborne Billings and the IUD easily removed.  Pt given IUD removal f/u instructions.  Pelvic US now--pt wants ablation. Brochure given and pre op with [facility].

## 2015-07-11 NOTE — Progress Notes (Signed)
PELVIC US TA/TV:normal anteverted uterus,EEC 5.60mm,normal ov's bilat (mobile),lt ov dominant follicle 2.1 x 1.7 x 7.62GB,TD free fluid seen,no pain during ultrasound

## 2015-07-12 ENCOUNTER — Other Ambulatory Visit: Payer: Self-pay | Admitting: Advanced Practice Midwife

## 2015-07-12 DIAGNOSIS — N946 Dysmenorrhea, unspecified: Secondary | ICD-10-CM

## 2015-07-30 ENCOUNTER — Encounter: Payer: Medicaid Other | Admitting: Obstetrics and Gynecology

## 2015-08-07 ENCOUNTER — Ambulatory Visit (INDEPENDENT_AMBULATORY_CARE_PROVIDER_SITE_OTHER): Payer: Medicaid Other | Admitting: Obstetrics and Gynecology

## 2015-08-07 ENCOUNTER — Encounter: Payer: Self-pay | Admitting: Obstetrics and Gynecology

## 2015-08-07 VITALS — BP 130/90 | Ht 63.0 in | Wt 194.0 lb

## 2015-08-07 DIAGNOSIS — N920 Excessive and frequent menstruation with regular cycle: Secondary | ICD-10-CM

## 2015-08-07 NOTE — Progress Notes (Signed)
Patient ID: [name], female   DOB: 1980/11/17, 34 y.o.   MRN: [id number]  Preoperative History and Physical  [name] is a 34 y.o. [id number] here for surgical management of cramping/bleeding and dyspareunia.  No significant preoperative concerns. Pt had an US done last month s/p IUD removal. Pt has been having ongoing cramping and bleeding. She states since removal of her IUD that dyspareunia has begun to resolve. She states her LNMP was around 07/11/15.   Proposed surgery: hysteroscopy, D&C endometrial ablation.    Past Medical History  Diagnosis Date  . Anxiety   . Acid reflux   . Hypertension    Past Surgical History  Procedure Laterality Date  . Ectopic pregnancy surgery    . Cholecystectomy    . Bilateral salpingectomy    . Cesarean section      x2  . Arm surgery Left     plate in arm  . Leg surgery Left     femur rodding and removal of part of left femur  . Bone biopsy Right     thumb  . Brain surgery      evacuation of hematoma  . Esophagogastroduodenoscopy (egd) with propofol N/A 01/30/2014    Procedure: ESOPHAGOGASTRODUODENOSCOPY (EGD) WITH PROPOFOL;  Surgeon: Danie Binder, MD;  Location: [facility];  Service: Endoscopy;  Laterality: N/A;   OB History  Gravida Para Term Preterm AB SAB TAB Ectopic Multiple Living  5 2 0 0 3 1 0 2 0 2     # Outcome Date GA Lbr Len/2nd Weight Sex Delivery Anes PTL Lv  5 Para 01/20/04    M CS-Unspec   Y  4 Para 07/27/99    M CS-Unspec   Y  3 SAB           2 Ectopic           1 Ectopic             Patient denies any other pertinent gynecologic issues.   Current Outpatient Prescriptions on File Prior to Visit  Medication Sig Dispense Refill  . alprazolam (XANAX) 2 MG tablet Take 1 tablet (2 mg total) by mouth 4 (four) times daily. 30 tablet 0  . amLODipine (NORVASC) 10 MG tablet Take 10 mg by mouth daily.    Marland Kitchen HYDROcodone-acetaminophen (NORCO) 10-325 MG per tablet Take 1 tablet by mouth every 6 (six) hours as needed for moderate  pain.    Marland Kitchen loratadine (CLARITIN) 10 MG tablet Take 10 mg by mouth daily.    . ondansetron (ZOFRAN) 8 MG tablet Take 1 tablet (8 mg total) by mouth every 8 (eight) hours as needed. 60 tablet 0  . pantoprazole (PROTONIX) 40 MG tablet Take 1 tablet (40 mg total) by mouth 2 (two) times daily before a meal. 60 tablet 1   No current facility-administered medications on file prior to visit.   Allergies  Allergen Reactions  . Demerol Nausea Only  . Orange Fruit [Citrus]     Fever blisters    Social History:   reports that she quit smoking about 3 years ago. Her smoking use included Cigarettes. She has a 20 pack-year smoking history. She has never used smokeless tobacco. She reports that she does not drink alcohol or use illicit drugs.  Family History  Problem Relation Age of Onset  . Arthritis    . Cancer    . Diabetes    . Cancer Maternal Grandfather     GASTRIC  . Mental illness Mother   .  Diabetes Mother   . Obesity Mother   . Obesity Sister   . Diabetes Son     boarderline    Review of Systems: 10 Systems reviewed and all are negative for acute change except as noted in the HPI.    PHYSICAL EXAM: Blood pressure 130/90, height 5\' 3"  (1.6 m), weight 194 lb (87.998 kg). General appearance - alert, well appearing, and in no distress Abdomen - soft, nontender, nondistended Pelvic -Small uterus, anteflexed, excellent support Extremities - peripheral pulses normal, no pedal edema, no clubbing or cyanosis   Labs: No results found for this or any previous visit (from the past 336 hour(s)).  Imaging Studies: US Transvaginal Non-ob  2015/07/28  GYNECOLOGIC SONOGRAM Annabella Elford is a 34 y.o. [id number] LMP unknown,s/p IUD removal, patient is here for a pelvic sonogram for cramping/bleeding,pre op ablation. Uterus                      7.8 x 5.69 x 4.3 cm, normal anteverted uterus Endometrium          5.1 mm, symmetrical, wnl Right ovary             3.2 x 2.39 x 1.79 cm, wnl Left ovary                 2.6 x 2.2 x 2.3 cm, wnl, simple dominate follicle 2.1 x 1.7 x 1.3 Technician Comments: PELVIC US TA/TV:normal anteverted uterus,EEC 5.76mm,normal ov's bilat (mobile),lt ov dominant follicle 2.1 x 1.7 x 4.2AS,TM free fluid seen,no pain during ultrasound [employer] 07/12/2015 1:43 PM Clinical Impression and recommendations: I have reviewed the sonogram results above. Combined with the patient's current clinical course, below are my impressions and any appropriate recommendations for management based on the sonographic findings: 1. Small anteflexed uterus, with smooth endometrium, thin at 5.1 mm, and symmetric, there is some heterogeneity of the myometrium on anterior abdominal wall, unable to rule out an area of adenomyosis. Normal ovaries with small simple cyst in left ovary consistent with normal corpus luteum cyst. Tallen Schnorr V  US Pelvis Complete  2015-07-28  GYNECOLOGIC SONOGRAM Lynsi Dooner is a 34 y.o. [id number] LMP unknown,s/p IUD removal, patient is here for a pelvic sonogram for cramping/bleeding,pre op ablation. Uterus                      7.8 x 5.69 x 4.3 cm, normal anteverted uterus Endometrium          5.1 mm, symmetrical, wnl Right ovary             3.2 x 2.39 x 1.79 cm, wnl Left ovary                2.6 x 2.2 x 2.3 cm, wnl, simple dominate follicle 2.1 x 1.7 x 1.3 Technician Comments: PELVIC US TA/TV:normal anteverted uterus,EEC 5.65mm,normal ov's bilat (mobile),lt ov dominant follicle 2.1 x 1.7 x 7.9GX,QJ free fluid seen,no pain during ultrasound [employer] 07/12/2015 1:43 PM Clinical Impression and recommendations: I have reviewed the sonogram results above. Combined with the patient's current clinical course, below are my impressions and any appropriate recommendations for management based on the sonographic findings: 1. Small anteflexed uterus, with smooth endometrium, thin at 5.1 mm, and symmetric, there is some heterogeneity of the myometrium on anterior abdominal wall, unable to  rule out an area of adenomyosis. Normal ovaries with small simple cyst in left ovary consistent with normal corpus luteum cyst. Jonnie Kind  Assessment: Patient Active Problem List   Diagnosis Date Noted  . IUD check up 06/27/2014  . Chronic pain 04/04/2014  . Chronic anxiety 04/04/2014  . Hypertension 04/04/2014  . Menorrhagia with regular cycle 03/21/2014  . Dyspareunia 03/21/2014  . Nausea and vomiting 01/26/2014  . Impingement syndrome of shoulder region 12/21/2012  . Pain in joint, shoulder region 12/21/2012  . Elbow pain 07/02/2011  . Acromioclavicular (joint) (ligament) sprain 06/10/2011  . Pain in joint, upper arm 06/10/2011  . Muscle weakness (generalized) 06/10/2011  . Tennis elbow 04/23/2011  . 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 CELLULITIS, HAND 12/09/2010  . NEOPLASMS UNSPEC NATURE BONE SOFT TISSUE&SKIN 11/18/2010    Plan: Patient will undergo surgical management with hysteroscopically-guided D&C endometrial ablation.   08/07/2015 9:27 AM    By signing my name below, I, Erling Conte, attest that this documentation has been prepared under the direction and in the presence of Jonnie Kind, MD. Electronically Signed: Erling Conte, ED Scribe. 08/07/2015. 9:31 AM.  I personally performed the services described in this documentation, which was SCRIBED in my presence. The recorded information has been reviewed and considered accurate. It has been edited as necessary during review. Jonnie Kind, MD

## 2015-08-07 NOTE — Progress Notes (Signed)
[name] FOR 08-13-15 @ 7:30AM

## 2015-08-07 NOTE — Progress Notes (Signed)
Patient ID: [name], female   DOB: 05/17/1981, 34 y.o.   MRN: [id number] Pt here today for pre op visit. Pt wants to discuss her options and which surgery will be best for her.

## 2015-08-07 NOTE — Patient Instructions (Signed)
[name]  08/07/2015     @PREFPERIOPPHARMACY @   Your procedure is scheduled on  08/13/2015   Report to [facility] at  615 A.M.  Call this number if you have problems the morning of surgery:  [phone]   Remember:  Do not eat food or drink liquids after midnight.  Take these medicines the morning of surgery with A SIP OF WATER : xanax, norvasc, hydrocodone, claritin, zofran, protonix.   Do not wear jewelry, make-up or nail polish.  Do not wear lotions, powders, or perfumes.  You may wear deodorant.  Do not shave 48 hours prior to surgery.  Men may shave face and neck.  Do not bring valuables to the hospital.  [facility] is not responsible for any belongings or valuables.  Contacts, dentures or bridgework may not be worn into surgery.  Leave your suitcase in the car.  After surgery it may be brought to your room.  For patients admitted to the hospital, discharge time will be determined by your treatment team.  Patients discharged the day of surgery will not be allowed to drive home.   Name and phone number of your driver:  Family  Special instructions:  none  Please read over the following fact sheets that you were given. Pain Booklet, Coughing and Deep Breathing, MRSA Information, Surgical Site Infection Prevention and Anesthesia Post-op Instructions      Hysteroscopy Hysteroscopy is a procedure used for looking inside the womb (uterus). It may be done for various reasons, including:  To evaluate abnormal bleeding, fibroid (benign, noncancerous) tumors, polyps, scar tissue (adhesions), and possibly cancer of the uterus.  To look for lumps (tumors) and other uterine growths.  To look for causes of why a woman cannot get pregnant (infertility), causes of recurrent loss of pregnancy (miscarriages), or a lost intrauterine device (IUD).  To perform a sterilization by blocking the fallopian tubes from inside the uterus. In this procedure, a thin,  flexible tube with a tiny light and camera on the end of it (hysteroscope) is used to look inside the uterus. A hysteroscopy should be done right after a menstrual period to be sure you are not pregnant. LET [facility] CARE PROVIDER KNOW ABOUT:   Any allergies you have.  All medicines you are taking, including vitamins, herbs, eye drops, creams, and over-the-counter medicines.  Previous problems you or members of your family have had with the use of anesthetics.  Any blood disorders you have.  Previous surgeries you have had.  Medical conditions you have. RISKS AND COMPLICATIONS  Generally, this is a safe procedure. However, as with any procedure, complications can occur. Possible complications include:  Putting a hole in the uterus.  Excessive bleeding.  Infection.  Damage to the cervix.  Injury to other organs.  Allergic reaction to medicines.  Too much fluid used in the uterus for the procedure. BEFORE THE PROCEDURE   Ask your health care provider about changing or stopping any regular medicines.  Do not take aspirin or blood thinners for 1 week before the procedure, or as directed by your health care provider. These can cause bleeding.  If you smoke, do not smoke for 2 weeks before the procedure.  In some cases, a medicine is placed in the cervix the day before the procedure. This medicine makes the cervix have a larger opening (dilate). This makes it easier for the instrument to be inserted into the uterus during the  procedure.  Do not eat or drink anything for at least 8 hours before the surgery.  Arrange for someone to take you home after the procedure. PROCEDURE   You may be given a medicine to relax you (sedative). You may also be given one of the following:  A medicine that numbs the area around the cervix (local anesthetic).  A medicine that makes you sleep through the procedure (general anesthetic).  The hysteroscope is inserted through the vagina into  the uterus. The camera on the hysteroscope sends a picture to a TV screen. This gives the surgeon a good view inside the uterus.  During the procedure, air or a liquid is put into the uterus, which allows the surgeon to see better.  Sometimes, tissue is gently scraped from inside the uterus. These tissue samples are sent to a lab for testing. AFTER THE PROCEDURE   If you had a general anesthetic, you may be groggy for a couple hours after the procedure.  If you had a local anesthetic, you will be able to go home as soon as you are stable and feel ready.  You may have some cramping. This normally lasts for a couple days.  You may have bleeding, which varies from light spotting for a few days to menstrual-like bleeding for 3-7 days. This is normal.  If your test results are not back during the visit, make an appointment with your health care provider to find out the results.   This information is not intended to replace advice given to you by your health care provider. Make sure you discuss any questions you have with your health care provider.   Document Released: 01/18/2001 Document Revised: 08/02/2013 Document Reviewed: 05/11/2013 Elsevier Interactive Patient Education 2016 [employer]. Endometrial Ablation Endometrial ablation removes the lining of the uterus (endometrium). It is usually a same-day, outpatient treatment. Ablation helps avoid major surgery, such as surgery to remove the cervix and uterus (hysterectomy). After endometrial ablation, you will have little or no menstrual bleeding and may not be able to have children. However, if you are premenopausal, you will need to use a reliable method of birth control following the procedure because of the small chance that pregnancy can occur. There are different reasons to have this procedure. These reasons include:  Heavy periods.  Bleeding that is causing anemia.  Irregular bleeding.  Bleeding fibroids on the lining inside the  uterus if they are smaller than 3 centimeters. This procedure may not be possible for you if:   You want to have children in the future.   You have severe cramps with your menstrual period.   You have precancerous or cancerous cells in your uterus.   You were recently pregnant.   You have gone through menopause.   You have had major surgery on your uterus, resulting in thinning of the uterine wall. Surgeries may include:  The removal of one or more uterine fibroids (myomectomy).  A cesarean section with a classic (vertical) incision on your uterus. Ask your health care provider what type of cesarean you had. Sometimes the scar on your skin is different than the scar on your uterus. Even if you have had surgery on your uterus, certain types of ablation may still be safe for you. Talk with your health care provider. LET [facility] CARE PROVIDER KNOW ABOUT:  Any allergies you have.  All medicines you are taking, including vitamins, herbs, eye drops, creams, and over-the-counter medicines.  Previous problems you or members of  your family have had with the use of anesthetics.  Any blood disorders you have.  Previous surgeries you have had.  Medical conditions you have. RISKS AND COMPLICATIONS  Generally, this is a safe procedure. However, as with any procedure, complications can occur. Possible complications include:  Perforation of the uterus.  Bleeding.  Infection of the uterus, bladder, or vagina.  Injury to surrounding organs.  An air bubble to the lung (air embolus).  Pregnancy following the procedure.  Failure of the procedure to help the problem, requiring hysterectomy.  Decreased ability to diagnose cancer in the lining of the uterus. BEFORE THE PROCEDURE  The lining of the uterus must be tested to make sure there is no pre-cancerous or cancer cells present.  An ultrasound may be performed to look at the size of the uterus and to check for  abnormalities.  Medicines may be given to thin the lining of the uterus. PROCEDURE  During the procedure, your health care provider will use a tool called a resectoscope to help see inside your uterus. There are different ways to remove the lining of your uterus.   Radiofrequency - This method uses a radiofrequency-alternating electric current to remove the lining of the uterus.  Cryotherapy - This method uses extreme cold to freeze the lining of the uterus.  Heated-Free Liquid - This method uses heated salt (saline) solution to remove the lining of the uterus.  Microwave - This method uses high-energy microwaves to heat up the lining of the uterus to remove it.  Thermal balloon - This method involves inserting a catheter with a balloon tip into the uterus. The balloon tip is filled with heated fluid to remove the lining of the uterus. AFTER THE PROCEDURE  After your procedure, do not have sexual intercourse or insert anything into your vagina until permitted by your health care provider. After the procedure, you may experience:  Cramps.  Vaginal discharge.  Frequent urination.   This information is not intended to replace advice given to you by your health care provider. Make sure you discuss any questions you have with your health care provider.   Document Released: 08/21/2004 Document Revised: 07/03/2015 Document Reviewed: 03/15/2013 Elsevier Interactive Patient Education 2016 [employer] Dilation and Curettage or Vacuum Curettage Dilation and curettage (D&C) and vacuum curettage are minor procedures. A D&C involves stretching (dilation) the cervix and scraping (curettage) the inside lining of the womb (uterus). During a D&C, tissue is gently scraped from the inside lining of the uterus. During a vacuum curettage, the lining and tissue in the uterus are removed with the use of gentle suction.  Curettage may be performed to either diagnose or treat a problem. As a diagnostic  procedure, curettage is performed to examine tissues from the uterus. A diagnostic curettage may be performed for the following symptoms:   Irregular bleeding in the uterus.   Bleeding with the development of clots.   Spotting between menstrual periods.   Prolonged menstrual periods.   Bleeding after menopause.   No menstrual period (amenorrhea).   A change in size and shape of the uterus.  As a treatment procedure, curettage may be performed for the following reasons:   Removal of an IUD (intrauterine device).   Removal of retained placenta after giving birth. Retained placenta can cause an infection or bleeding severe enough to require transfusions.   Abortion.   Miscarriage.   Removal of polyps inside the uterus.   Removal of uncommon types of noncancerous lumps (fibroids).  LET [facility] CARE PROVIDER KNOW ABOUT:   Any allergies you have.   All medicines you are taking, including vitamins, herbs, eye drops, creams, and over-the-counter medicines.   Previous problems you or members of your family have had with the use of anesthetics.   Any blood disorders you have.   Previous surgeries you have had.   Medical conditions you have. RISKS AND COMPLICATIONS  Generally, this is a safe procedure. However, as with any procedure, complications can occur. Possible complications include:  Excessive bleeding.   Infection of the uterus.   Damage to the cervix.   Development of scar tissue (adhesions) inside the uterus, later causing abnormal amounts of menstrual bleeding.   Complications from the general anesthetic, if a general anesthetic is used.   Putting a hole (perforation) in the uterus. This is rare.  BEFORE THE PROCEDURE   Eat and drink before the procedure only as directed by your health care provider.   Arrange for someone to take you home.  PROCEDURE  This procedure usually takes about 15-30 minutes.  You will be given one  of the following:  A medicine that numbs the area in and around the cervix (local anesthetic).   A medicine to make you sleep through the procedure (general anesthetic).  You will lie on your back with your legs in stirrups.   A warm metal or plastic instrument (speculum) will be placed in your vagina to keep it open and to allow the health care provider to see the cervix.  There are two ways in which your cervix can be softened and dilated. These include:   Taking a medicine.   Having thin rods (laminaria) inserted into your cervix.   A curved tool (curette) will be used to scrape cells from the inside lining of the uterus. In some cases, gentle suction is applied with the curette. The curette will then be removed.  AFTER THE PROCEDURE   You will rest in the recovery area until you are stable and are ready to go home.   You may feel sick to your stomach (nauseous) or throw up (vomit) if you were given a general anesthetic.   You may have a sore throat if a tube was placed in your throat during general anesthesia.   You may have light cramping and bleeding. This may last for 2 days to 2 weeks after the procedure.   Your uterus needs to make a new lining after the procedure. This may make your next period late.   This information is not intended to replace advice given to you by your health care provider. Make sure you discuss any questions you have with your health care provider.   Document Released: 10/12/2005 Document Revised: 06/14/2013 Document Reviewed: 05/11/2013 Elsevier Interactive Patient Education 2016 [employer] PATIENT INSTRUCTIONS POST-ANESTHESIA  IMMEDIATELY FOLLOWING SURGERY:  Do not drive or operate machinery for the first twenty four hours after surgery.  Do not make any important decisions for twenty four hours after surgery or while taking narcotic pain medications or sedatives.  If you develop intractable nausea and vomiting or a severe headache  please notify your doctor immediately.  FOLLOW-UP:  Please make an appointment with your surgeon as instructed. You do not need to follow up with anesthesia unless specifically instructed to do so.  WOUND CARE INSTRUCTIONS (if applicable):  Keep a dry clean dressing on the anesthesia/puncture wound site if there is drainage.  Once the wound has quit draining you may leave  it open to air.  Generally you should leave the bandage intact for twenty four hours unless there is drainage.  If the epidural site drains for more than 36-48 hours please call the anesthesia department.  QUESTIONS?:  Please feel free to call your physician or the hospital operator if you have any questions, and they will be happy to assist you.

## 2015-08-09 ENCOUNTER — Encounter (HOSPITAL_COMMUNITY): Payer: Self-pay

## 2015-08-09 ENCOUNTER — Other Ambulatory Visit: Payer: Self-pay

## 2015-08-09 ENCOUNTER — Encounter (HOSPITAL_COMMUNITY)
Admission: RE | Admit: 2015-08-09 | Discharge: 2015-08-09 | Disposition: A | Discharge status: Home / Self Care | Payer: Medicaid Other | Source: Ambulatory Visit | Attending: Obstetrics and Gynecology | Admitting: Obstetrics and Gynecology

## 2015-08-09 ENCOUNTER — Other Ambulatory Visit: Payer: Self-pay | Admitting: Obstetrics and Gynecology

## 2015-08-09 DIAGNOSIS — Z01818 Encounter for other preprocedural examination: Secondary | ICD-10-CM | POA: Diagnosis present

## 2015-08-09 DIAGNOSIS — N941 Unspecified dyspareunia: Secondary | ICD-10-CM | POA: Diagnosis not present

## 2015-08-09 HISTORY — DX: Unspecified osteoarthritis, unspecified site: M19.90

## 2015-08-09 HISTORY — DX: Other allergy status, other than to drugs and biological substances: Z91.09

## 2015-08-09 LAB — BASIC METABOLIC PANEL
ANION GAP: 7 (ref 5–15)
BUN: 9 mg/dL (ref 6–20)
CHLORIDE: 108 mmol/L (ref 101–111)
CO2: 22 mmol/L (ref 22–32)
CREATININE: 0.78 mg/dL (ref 0.44–1.00)
Calcium: 9.3 mg/dL (ref 8.9–10.3)
GFR calc non Af Amer: >60 mL/min (ref >60)
Glucose, Bld: 101 mg/dL — ABNORMAL HIGH (ref 65–99)
POTASSIUM: 4 mmol/L (ref 3.5–5.1)
Sodium: 137 mmol/L (ref 135–145)

## 2015-08-09 LAB — URINALYSIS, ROUTINE W REFLEX MICROSCOPIC
BILIRUBIN URINE: NEGATIVE
GLUCOSE, UA: NEGATIVE mg/dL
KETONES UR: NEGATIVE mg/dL
Leukocytes, UA: NEGATIVE
NITRITE: NEGATIVE
PH: 7 (ref 5.0–8.0)
Protein, ur: NEGATIVE mg/dL
Specific Gravity, Urine: 1.02 (ref 1.005–1.030)
Urobilinogen, UA: 0.2 mg/dL (ref 0.0–1.0)

## 2015-08-09 LAB — CBC
HCT: 40.9 % (ref 36.0–46.0)
Hemoglobin: 14.2 g/dL (ref 12.0–15.0)
MCH: 31.8 pg (ref 26.0–34.0)
MCHC: 34.7 g/dL (ref 30.0–36.0)
MCV: 91.7 fL (ref 78.0–100.0)
PLATELETS: 245 10*3/uL (ref 150–400)
RBC: 4.46 MIL/uL (ref 3.87–5.11)
RDW: 12.1 % (ref 11.5–15.5)
WBC: 7.9 10*3/uL (ref 4.0–10.5)

## 2015-08-09 LAB — URINE MICROSCOPIC-ADD ON

## 2015-08-09 LAB — HCG, SERUM, QUALITATIVE: PREG SERUM: NEGATIVE

## 2015-08-09 NOTE — Pre-Procedure Instructions (Signed)
Patient given information to sign up for my chart at home. 

## 2015-08-09 NOTE — H&P (Signed)
Expand All Collapse All   Patient ID: [name], female DOB: April 04, 1981, 34 y.o. MRN: [id number] Pt here today for pre op visit. Pt wants to discuss her options and which surgery will be best for her.             Jonnie Kind, MD at 08/07/2015 9:27 AM     Status: Signed       Expand All Collapse All   Patient ID: [name], female DOB: [birth year], 34 y.o. MRN: [id number]  Preoperative History and Physical  [name] is a 34 y.o. [id number] here for surgical management of cramping/bleeding and dyspareunia. No significant preoperative concerns. Pt had an US done last month s/p IUD removal. Pt has been having ongoing cramping and bleeding. She states since removal of her IUD that dyspareunia has begun to resolve. She states her LNMP was around 07/11/15.   Proposed surgery: hysteroscopy, D&C endometrial ablation.    Past Medical History  Diagnosis Date  . Anxiety   . Acid reflux   . Hypertension    Past Surgical History  Procedure Laterality Date  . Ectopic pregnancy surgery    . Cholecystectomy    . Bilateral salpingectomy    . Cesarean section      x2  . Arm surgery Left     plate in arm  . Leg surgery Left     femur rodding and removal of part of left femur  . Bone biopsy Right     thumb  . Brain surgery      evacuation of hematoma  . Esophagogastroduodenoscopy (egd) with propofol N/A 01/30/2014    Procedure: ESOPHAGOGASTRODUODENOSCOPY (EGD) WITH PROPOFOL; Surgeon: Danie Binder, MD; Location: [facility]; Service: Endoscopy; Laterality: N/A;   OB History  Gravida Para Term Preterm AB SAB TAB Ectopic Multiple Living  5 2 0 0 3 1 0 2 0 2     # Outcome Date GA Lbr Len/2nd Weight Sex Delivery Anes PTL Lv  5 Para 01/20/04    M CS-Unspec   Y  4 Para 07/27/99    M CS-Unspec   Y  3 SAB           2 Ectopic            1 Ectopic             Patient denies any other pertinent gynecologic issues.   Current Outpatient Prescriptions on File Prior to Visit  Medication Sig Dispense Refill  . alprazolam (XANAX) 2 MG tablet Take 1 tablet (2 mg total) by mouth 4 (four) times daily. 30 tablet 0  . amLODipine (NORVASC) 10 MG tablet Take 10 mg by mouth daily.    Marland Kitchen HYDROcodone-acetaminophen (NORCO) 10-325 MG per tablet Take 1 tablet by mouth every 6 (six) hours as needed for moderate pain.    Marland Kitchen loratadine (CLARITIN) 10 MG tablet Take 10 mg by mouth daily.    . ondansetron (ZOFRAN) 8 MG tablet Take 1 tablet (8 mg total) by mouth every 8 (eight) hours as needed. 60 tablet 0  . pantoprazole (PROTONIX) 40 MG tablet Take 1 tablet (40 mg total) by mouth 2 (two) times daily before a meal. 60 tablet 1   No current facility-administered medications on file prior to visit.   Allergies  Allergen Reactions  . Demerol Nausea Only  . Orange Fruit [Citrus]     Fever blisters    Social History:  reports that she quit smoking about 3 years ago. Her smoking  use included Cigarettes. She has a 20 pack-year smoking history. She has never used smokeless tobacco. She reports that she does not drink alcohol or use illicit drugs.  Family History  Problem Relation Age of Onset  . Arthritis    . Cancer    . Diabetes    . Cancer Maternal Grandfather     GASTRIC  . Mental illness Mother   . Diabetes Mother   . Obesity Mother   . Obesity Sister   . Diabetes Son     boarderline    Review of Systems: 10 Systems reviewed and all are negative for acute change except as noted in the HPI.    PHYSICAL EXAM: Blood pressure 130/90, height 5\' 3"  (1.6 m), weight 194 lb (87.998 kg). General appearance - alert, well appearing, and in no distress Abdomen - soft, nontender, nondistended Pelvic -Small uterus, anteflexed,  excellent support Extremities - peripheral pulses normal, no pedal edema, no clubbing or cyanosis   Labs: No results found for this or any previous visit (from the past 336 hour(s)).  Imaging Studies:  Imaging Results    US Transvaginal Non-ob  07/18/2015 GYNECOLOGIC SONOGRAM [name] is a 34 y.o. [id number] LMP unknown,s/p IUD removal, patient is here for a pelvic sonogram for cramping/bleeding,pre op ablation. Uterus 7.8 x 5.69 x 4.3 cm, normal anteverted uterus Endometrium 5.1 mm, symmetrical, wnl Right ovary 3.2 x 2.39 x 1.79 cm, wnl Left ovary 2.6 x 2.2 x 2.3 cm, wnl, simple dominate follicle 2.1 x 1.7 x 1.3 Technician Comments: PELVIC US TA/TV:normal anteverted uterus,EEC 5.63mm,normal ov's bilat (mobile),lt ov dominant follicle 2.1 x 1.7 x 2.2QJ,FH free fluid seen,no pain during ultrasound [employer] 07/12/2015 1:43 PM Clinical Impression and recommendations: I have reviewed the sonogram results above. Combined with the patient's current clinical course, below are my impressions and any appropriate recommendations for management based on the sonographic findings: 1. Small anteflexed uterus, with smooth endometrium, thin at 5.1 mm, and symmetric, there is some heterogeneity of the myometrium on anterior abdominal wall, unable to rule out an area of adenomyosis. Normal ovaries with small simple cyst in left ovary consistent with normal corpus luteum cyst. Shota Kohrs V  US Pelvis Complete  07/18/2015 GYNECOLOGIC SONOGRAM [name] is a 34 y.o. [id number] LMP unknown,s/p IUD removal, patient is here for a pelvic sonogram for cramping/bleeding,pre op ablation. Uterus 7.8 x 5.69 x 4.3 cm, normal anteverted uterus Endometrium 5.1 mm, symmetrical, wnl Right ovary 3.2 x 2.39 x 1.79 cm, wnl Left ovary 2.6 x 2.2 x 2.3 cm, wnl, simple dominate follicle 2.1 x 1.7 x 1.3 Technician Comments:  PELVIC US TA/TV:normal anteverted uterus,EEC 5.23mm,normal ov's bilat (mobile),lt ov dominant follicle 2.1 x 1.7 x 3.8LH,TD free fluid seen,no pain during ultrasound [employer] 07/12/2015 1:43 PM Clinical Impression and recommendations: I have reviewed the sonogram results above. Combined with the patient's current clinical course, below are my impressions and any appropriate recommendations for management based on the sonographic findings: 1. Small anteflexed uterus, with smooth endometrium, thin at 5.1 mm, and symmetric, there is some heterogeneity of the myometrium on anterior abdominal wall, unable to rule out an area of adenomyosis. Normal ovaries with small simple cyst in left ovary consistent with normal corpus luteum cyst. Blakelynn Scheeler V    Assessment: Patient Active Problem List   Diagnosis Date Noted  . IUD check up 06/27/2014  . Chronic pain 04/04/2014  . Chronic anxiety 04/04/2014  . Hypertension 04/04/2014  . Menorrhagia with regular cycle 03/21/2014  .  Dyspareunia 03/21/2014  . Nausea and vomiting 01/26/2014  . Impingement syndrome of shoulder region 12/21/2012  . Pain in joint, shoulder region 12/21/2012  . Elbow pain 07/02/2011  . Acromioclavicular (joint) (ligament) sprain 06/10/2011  . Pain in joint, upper arm 06/10/2011  . Muscle weakness (generalized) 06/10/2011  . Tennis elbow 04/23/2011  . 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 CELLULITIS, HAND 12/09/2010  . NEOPLASMS UNSPEC NATURE BONE SOFT TISSUE&SKIN 11/18/2010    Plan: Patient will undergo surgical management with hysteroscopically-guided D&C endometrial ablation.   08/07/2015 9:27 AM    By signing my name below, I, Erling Conte, attest that this documentation has been prepared under the direction and in the presence of Jonnie Kind, MD. Electronically Signed: Erling Conte, ED Scribe. 08/07/2015. 9:31 AM.  I personally performed the services described in  this documentation, which was SCRIBED in my presence. The recorded information has been reviewed and considered accurate. It has been edited as necessary during review. Jonnie Kind, MD

## 2015-08-13 ENCOUNTER — Observation Stay (HOSPITAL_COMMUNITY): Payer: Medicaid Other

## 2015-08-13 ENCOUNTER — Observation Stay (HOSPITAL_COMMUNITY)
Admission: RE | Admit: 2015-08-13 | Discharge: 2015-08-13 | Disposition: A | Discharge status: Home / Self Care | Payer: Medicaid Other | Source: Ambulatory Visit | Attending: Obstetrics and Gynecology | Admitting: Obstetrics and Gynecology

## 2015-08-13 ENCOUNTER — Ambulatory Visit (HOSPITAL_COMMUNITY): Payer: Medicaid Other | Admitting: Anesthesiology

## 2015-08-13 ENCOUNTER — Encounter (HOSPITAL_COMMUNITY)
Admission: RE | Disposition: A | Discharge status: Home / Self Care | Payer: Self-pay | Source: Ambulatory Visit | Attending: Obstetrics and Gynecology

## 2015-08-13 ENCOUNTER — Encounter (HOSPITAL_COMMUNITY): Payer: Self-pay | Admitting: *Deleted

## 2015-08-13 DIAGNOSIS — Z9049 Acquired absence of other specified parts of digestive tract: Secondary | ICD-10-CM | POA: Diagnosis not present

## 2015-08-13 DIAGNOSIS — N9971 Accidental puncture and laceration of a genitourinary system organ or structure during a genitourinary system procedure: Secondary | ICD-10-CM | POA: Diagnosis present

## 2015-08-13 DIAGNOSIS — I10 Essential (primary) hypertension: Secondary | ICD-10-CM | POA: Diagnosis not present

## 2015-08-13 DIAGNOSIS — Z885 Allergy status to narcotic agent status: Secondary | ICD-10-CM | POA: Insufficient documentation

## 2015-08-13 DIAGNOSIS — K219 Gastro-esophageal reflux disease without esophagitis: Secondary | ICD-10-CM | POA: Diagnosis not present

## 2015-08-13 DIAGNOSIS — N882 Stricture and stenosis of cervix uteri: Secondary | ICD-10-CM | POA: Diagnosis present

## 2015-08-13 DIAGNOSIS — M1990 Unspecified osteoarthritis, unspecified site: Secondary | ICD-10-CM | POA: Diagnosis not present

## 2015-08-13 DIAGNOSIS — N941 Unspecified dyspareunia: Secondary | ICD-10-CM | POA: Diagnosis not present

## 2015-08-13 DIAGNOSIS — Z09 Encounter for follow-up examination after completed treatment for conditions other than malignant neoplasm: Secondary | ICD-10-CM

## 2015-08-13 DIAGNOSIS — N946 Dysmenorrhea, unspecified: Secondary | ICD-10-CM | POA: Diagnosis not present

## 2015-08-13 DIAGNOSIS — Z87891 Personal history of nicotine dependence: Secondary | ICD-10-CM | POA: Insufficient documentation

## 2015-08-13 DIAGNOSIS — Z9079 Acquired absence of other genital organ(s): Secondary | ICD-10-CM | POA: Diagnosis not present

## 2015-08-13 DIAGNOSIS — Z91018 Allergy to other foods: Secondary | ICD-10-CM | POA: Insufficient documentation

## 2015-08-13 DIAGNOSIS — Z79899 Other long term (current) drug therapy: Secondary | ICD-10-CM | POA: Insufficient documentation

## 2015-08-13 DIAGNOSIS — F419 Anxiety disorder, unspecified: Secondary | ICD-10-CM | POA: Insufficient documentation

## 2015-08-13 HISTORY — PX: HYSTEROSCOPY WITH D & C: SHX1775

## 2015-08-13 LAB — CBC
HCT: 37.2 % (ref 36.0–46.0)
Hemoglobin: 12.5 g/dL (ref 12.0–15.0)
MCH: 31 pg (ref 26.0–34.0)
MCHC: 33.6 g/dL (ref 30.0–36.0)
MCV: 92.3 fL (ref 78.0–100.0)
PLATELETS: 228 10*3/uL (ref 150–400)
RBC: 4.03 MIL/uL (ref 3.87–5.11)
RDW: 11.9 % (ref 11.5–15.5)
WBC: 6.6 10*3/uL (ref 4.0–10.5)

## 2015-08-13 LAB — BASIC METABOLIC PANEL
Anion gap: 7 (ref 5–15)
BUN: 6 mg/dL (ref 6–20)
CALCIUM: 8.6 mg/dL — AB (ref 8.9–10.3)
CO2: 23 mmol/L (ref 22–32)
CREATININE: 0.67 mg/dL (ref 0.44–1.00)
Chloride: 108 mmol/L (ref 101–111)
GFR calc Af Amer: >60 mL/min (ref >60)
Glucose, Bld: 101 mg/dL — ABNORMAL HIGH (ref 65–99)
POTASSIUM: 4 mmol/L (ref 3.5–5.1)
SODIUM: 138 mmol/L (ref 135–145)

## 2015-08-13 LAB — CBC WITH DIFFERENTIAL/PLATELET
BASOS ABS: 0 10*3/uL (ref 0.0–0.1)
BASOS PCT: 0 %
Eosinophils Absolute: 0 10*3/uL (ref 0.0–0.7)
Eosinophils Relative: 1 %
Lymphocytes Relative: 25 %
Lymphs Abs: 1.6 10*3/uL (ref 0.7–4.0)
Monocytes Absolute: 0.8 10*3/uL (ref 0.1–1.0)
Monocytes Relative: 12 %
NEUTROS ABS: 4.1 10*3/uL (ref 1.7–7.7)
NEUTROS PCT: 63 %

## 2015-08-13 SURGERY — DILATATION AND CURETTAGE /HYSTEROSCOPY
Anesthesia: General

## 2015-08-13 MED ORDER — EPHEDRINE SULFATE 50 MG/ML IJ SOLN
INTRAMUSCULAR | Status: AC
  Filled 2015-08-13: qty 1

## 2015-08-13 MED ORDER — SODIUM CHLORIDE 0.9 % IJ SOLN
INTRAMUSCULAR | Status: AC
  Filled 2015-08-13: qty 10

## 2015-08-13 MED ORDER — AMLODIPINE BESYLATE 5 MG PO TABS: 10.0000 mg | ORAL_TABLET | Freq: Every day | ORAL | Status: DC

## 2015-08-13 MED ORDER — LIDOCAINE HCL (PF) 1 % IJ SOLN
INTRAMUSCULAR | Status: AC
  Filled 2015-08-13: qty 5

## 2015-08-13 MED ORDER — LIDOCAINE HCL (CARDIAC) 20 MG/ML IV SOLN
INTRAVENOUS | Status: DC | PRN
  Administered 2015-08-13: 50 mg via INTRAVENOUS

## 2015-08-13 MED ORDER — KETOROLAC TROMETHAMINE 30 MG/ML IJ SOLN: 30.0000 mg | Freq: Four times a day (QID) | INTRAMUSCULAR | Status: DC

## 2015-08-13 MED ORDER — PROPOFOL 10 MG/ML IV BOLUS
INTRAVENOUS | Status: AC
  Filled 2015-08-13: qty 20

## 2015-08-13 MED ORDER — MIDAZOLAM HCL 2 MG/2ML IJ SOLN
1.0000 mg | INTRAMUSCULAR | Status: DC | PRN
  Administered 2015-08-13: 2 mg via INTRAVENOUS

## 2015-08-13 MED ORDER — ONDANSETRON HCL 4 MG/2ML IJ SOLN: 4.0000 mg | Freq: Once | INTRAMUSCULAR | Status: DC | PRN

## 2015-08-13 MED ORDER — SODIUM CHLORIDE 0.9 % IV SOLN
INTRAVENOUS | Status: DC
  Administered 2015-08-13: 11:00:00 via INTRAVENOUS

## 2015-08-13 MED ORDER — FENTANYL CITRATE (PF) 100 MCG/2ML IJ SOLN
25.0000 ug | INTRAMUSCULAR | Status: DC
  Administered 2015-08-13 (×2): 25 ug via INTRAVENOUS
  Filled 2015-08-13: qty 2

## 2015-08-13 MED ORDER — OXYCODONE-ACETAMINOPHEN 5-325 MG PO TABS: 1.0000 | ORAL_TABLET | ORAL | Status: DC | PRN

## 2015-08-13 MED ORDER — ARTIFICIAL TEARS OP OINT
TOPICAL_OINTMENT | OPHTHALMIC | Status: AC
  Filled 2015-08-13: qty 3.5

## 2015-08-13 MED ORDER — FENTANYL CITRATE (PF) 100 MCG/2ML IJ SOLN
25.0000 ug | INTRAMUSCULAR | Status: DC | PRN
  Administered 2015-08-13 (×2): 50 ug via INTRAVENOUS

## 2015-08-13 MED ORDER — GLYCOPYRROLATE 0.2 MG/ML IJ SOLN
INTRAMUSCULAR | Status: AC
  Filled 2015-08-13: qty 1

## 2015-08-13 MED ORDER — BUPIVACAINE-EPINEPHRINE (PF) 0.5% -1:200000 IJ SOLN
INTRAMUSCULAR | Status: AC
  Filled 2015-08-13: qty 30

## 2015-08-13 MED ORDER — ONDANSETRON HCL 4 MG/2ML IJ SOLN
4.0000 mg | Freq: Once | INTRAMUSCULAR | Status: AC
  Administered 2015-08-13: 4 mg via INTRAVENOUS

## 2015-08-13 MED ORDER — IBUPROFEN 600 MG PO TABS: 600.0000 mg | ORAL_TABLET | Freq: Four times a day (QID) | ORAL | Status: DC | PRN

## 2015-08-13 MED ORDER — FENTANYL CITRATE (PF) 100 MCG/2ML IJ SOLN
INTRAMUSCULAR | Status: AC
  Filled 2015-08-13: qty 2

## 2015-08-13 MED ORDER — KETOROLAC TROMETHAMINE 30 MG/ML IJ SOLN
30.0000 mg | Freq: Four times a day (QID) | INTRAMUSCULAR | Status: DC
  Administered 2015-08-13: 30 mg via INTRAVENOUS
  Filled 2015-08-13: qty 1

## 2015-08-13 MED ORDER — KETOROLAC TROMETHAMINE 30 MG/ML IJ SOLN
30.0000 mg | Freq: Once | INTRAMUSCULAR | Status: AC
  Administered 2015-08-13: 30 mg via INTRAVENOUS

## 2015-08-13 MED ORDER — FENTANYL CITRATE (PF) 250 MCG/5ML IJ SOLN
INTRAMUSCULAR | Status: AC
  Filled 2015-08-13: qty 25

## 2015-08-13 MED ORDER — 0.9 % SODIUM CHLORIDE (POUR BTL) OPTIME
TOPICAL | Status: DC | PRN
  Administered 2015-08-13: 1000 mL

## 2015-08-13 MED ORDER — ONDANSETRON HCL 4 MG PO TABS: 4.0000 mg | ORAL_TABLET | Freq: Four times a day (QID) | ORAL | Status: DC | PRN

## 2015-08-13 MED ORDER — ALPRAZOLAM 1 MG PO TABS
2.0000 mg | ORAL_TABLET | Freq: Four times a day (QID) | ORAL | Status: DC
Start: 2015-08-13 — End: 2015-08-13
  Filled 2015-08-13: qty 2

## 2015-08-13 MED ORDER — ONDANSETRON HCL 4 MG/2ML IJ SOLN
INTRAMUSCULAR | Status: AC
  Filled 2015-08-13: qty 2

## 2015-08-13 MED ORDER — BUPIVACAINE HCL 0.5 % IJ SOLN
INTRAMUSCULAR | Status: DC | PRN
  Administered 2015-08-13: 20 mL

## 2015-08-13 MED ORDER — MIDAZOLAM HCL 2 MG/2ML IJ SOLN
INTRAMUSCULAR | Status: AC
  Filled 2015-08-13: qty 2

## 2015-08-13 MED ORDER — ONDANSETRON HCL 4 MG/2ML IJ SOLN: 4.0000 mg | Freq: Four times a day (QID) | INTRAMUSCULAR | Status: DC | PRN

## 2015-08-13 MED ORDER — SUCCINYLCHOLINE CHLORIDE 20 MG/ML IJ SOLN
INTRAMUSCULAR | Status: AC
  Filled 2015-08-13: qty 1

## 2015-08-13 MED ORDER — HYDROMORPHONE HCL 1 MG/ML IJ SOLN: 1.0000 mg | INTRAMUSCULAR | Status: DC | PRN

## 2015-08-13 MED ORDER — GLYCOPYRROLATE 0.2 MG/ML IJ SOLN
0.2000 mg | Freq: Once | INTRAMUSCULAR | Status: AC
  Administered 2015-08-13: 0.2 mg via INTRAVENOUS

## 2015-08-13 MED ORDER — KETOROLAC TROMETHAMINE 30 MG/ML IJ SOLN: 30.0000 mg | Freq: Once | INTRAMUSCULAR | Status: DC

## 2015-08-13 MED ORDER — PROPOFOL 10 MG/ML IV BOLUS
INTRAVENOUS | Status: DC | PRN
  Administered 2015-08-13: 160 mg via INTRAVENOUS

## 2015-08-13 MED ORDER — LACTATED RINGERS IV SOLN
INTRAVENOUS | Status: DC
  Administered 2015-08-13 (×2): via INTRAVENOUS

## 2015-08-13 MED ORDER — PANTOPRAZOLE SODIUM 40 MG PO TBEC: 40.0000 mg | DELAYED_RELEASE_TABLET | Freq: Every day | ORAL | Status: DC

## 2015-08-13 MED ORDER — FENTANYL CITRATE (PF) 100 MCG/2ML IJ SOLN
INTRAMUSCULAR | Status: DC | PRN
  Administered 2015-08-13: 25 ug via INTRAVENOUS
  Administered 2015-08-13: 50 ug via INTRAVENOUS
  Administered 2015-08-13 (×2): 25 ug via INTRAVENOUS
  Administered 2015-08-13: 50 ug via INTRAVENOUS
  Administered 2015-08-13: 25 ug via INTRAVENOUS
  Administered 2015-08-13: 50 ug via INTRAVENOUS

## 2015-08-13 MED ORDER — PANTOPRAZOLE SODIUM 40 MG PO TBEC
40.0000 mg | DELAYED_RELEASE_TABLET | Freq: Two times a day (BID) | ORAL | Status: DC
  Administered 2015-08-13: 40 mg via ORAL
  Filled 2015-08-13: qty 1

## 2015-08-13 MED ORDER — KETOROLAC TROMETHAMINE 30 MG/ML IJ SOLN
INTRAMUSCULAR | Status: AC
  Filled 2015-08-13: qty 1

## 2015-08-13 SURGICAL SUPPLY — 31 items
ABLATOR ENDOMETRIAL BIPOLAR (ABLATOR) ×1 IMPLANT
BAG HAMPER (MISCELLANEOUS) ×2 IMPLANT
CATH ROBINSON RED A/P 16FR (CATHETERS) ×1 IMPLANT
CLOTH BEACON ORANGE TIMEOUT ST (SAFETY) ×2 IMPLANT
COVER LIGHT HANDLE STERIS (MISCELLANEOUS) ×4 IMPLANT
DECANTER SPIKE VIAL GLASS SM (MISCELLANEOUS) ×2 IMPLANT
FORMALIN 10 PREFIL 120ML (MISCELLANEOUS) ×1 IMPLANT
GLOVE BIOGEL PI IND STRL 7.0 (GLOVE) IMPLANT
GLOVE BIOGEL PI IND STRL 9 (GLOVE) ×1 IMPLANT
GLOVE BIOGEL PI INDICATOR 7.0 (GLOVE) ×1
GLOVE BIOGEL PI INDICATOR 9 (GLOVE) ×1
GLOVE ECLIPSE 9.0 STRL (GLOVE) ×2 IMPLANT
GLOVE EXAM NITRILE MD LF STRL (GLOVE) ×1 IMPLANT
GLOVE SS BIOGEL STRL SZ 6.5 (GLOVE) IMPLANT
GLOVE SUPERSENSE BIOGEL SZ 6.5 (GLOVE) ×1
GOWN SPEC L3 XXLG W/TWL (GOWN DISPOSABLE) ×2 IMPLANT
GOWN STRL REUS W/TWL LRG LVL3 (GOWN DISPOSABLE) ×2 IMPLANT
INST SET HYSTEROSCOPY (KITS) ×2 IMPLANT
IV NS 1000ML (IV SOLUTION)
IV NS 1000ML BAXH (IV SOLUTION) ×1 IMPLANT
IV NS IRRIG 3000ML ARTHROMATIC (IV SOLUTION) ×1 IMPLANT
KIT ROOM TURNOVER AP CYSTO (KITS) ×2 IMPLANT
KIT ROOM TURNOVER APOR (KITS) ×2 IMPLANT
MANIFOLD NEPTUNE II (INSTRUMENTS) ×2 IMPLANT
NS IRRIG 1000ML POUR BTL (IV SOLUTION) ×2 IMPLANT
PACK PERI GYN (CUSTOM PROCEDURE TRAY) ×2 IMPLANT
PAD ARMBOARD 7.5X6 YLW CONV (MISCELLANEOUS) ×2 IMPLANT
PAD TELFA 3X4 1S STER (GAUZE/BANDAGES/DRESSINGS) ×2 IMPLANT
SET BASIN LINEN APH (SET/KITS/TRAYS/PACK) ×2 IMPLANT
SET IRRIG Y TYPE TUR BLADDER L (SET/KITS/TRAYS/PACK) ×2 IMPLANT
SYR CONTROL 10ML LL (SYRINGE) ×2 IMPLANT

## 2015-08-13 NOTE — Transfer of Care (Signed)
Immediate Anesthesia Transfer of Care Note  Patient: [name]  Procedure(s) Performed: Procedure(s): DILATATION AND CURETTAGE /HYSTEROSCOPY (N/A)  Patient Location: PACU  Anesthesia Type:General  Level of Consciousness: awake, alert , oriented and patient cooperative  Airway & Oxygen Therapy: Patient Spontanous Breathing and Patient connected to face mask oxygen  Post-op Assessment: Report given to RN and Post -op Vital signs reviewed and stable  Post vital signs: Reviewed and stable  Last Vitals:  Filed Vitals:   08/13/15 0730  BP: 117/91  Pulse:   Temp:   Resp: 15    Complications: No apparent anesthesia complications

## 2015-08-13 NOTE — Discharge Instructions (Signed)
General Anesthesia, Adult, Care After Refer to this sheet in the next few weeks. These instructions provide you with information on caring for yourself after your procedure. Your health care provider may also give you more specific instructions. Your treatment has been planned according to current medical practices, but problems sometimes occur. Call your health care provider if you have any problems or questions after your procedure. WHAT TO EXPECT AFTER THE PROCEDURE After the procedure, it is typical to experience:  Sleepiness.  Nausea and vomiting. HOME CARE INSTRUCTIONS  For the first 24 hours after general anesthesia:  Have a responsible person with you.  Do not drive a car. If you are alone, do not take public transportation.  Do not drink alcohol.  Do not take medicine that has not been prescribed by your health care provider.  Do not sign important papers or make important decisions.  You may resume a normal diet and activities as directed by your health care provider.  Change bandages (dressings) as directed.  If you have questions or problems that seem related to general anesthesia, call the hospital and ask for the anesthetist or anesthesiologist on call. SEEK MEDICAL CARE IF:  You have nausea and vomiting that continue the day after anesthesia.  You develop a rash. SEEK IMMEDIATE MEDICAL CARE IF:   You have difficulty breathing.  You have chest pain.  You have any allergic problems.   This information is not intended to replace advice given to you by your health care provider. Make sure you discuss any questions you have with your health care provider.   Document Released: 01/18/2001 Document Revised: 11/02/2014 Document Reviewed: 02/10/2012 Elsevier Interactive Patient Education [employer].   The main risk of your procedure would be if you had internal organ injury during the procedure, which would result in abdominal pain, fever and vomiting.  Please be quick to notify me or the hospital ED for any concerns.

## 2015-08-13 NOTE — Progress Notes (Signed)
Patient discharged home.  Iv removed - WNL.  Reviewed medications and when to call MD.  No questions at this time, verbalizes understanding.  Assisted off unit via WC by NT in stable condition

## 2015-08-13 NOTE — Discharge Summary (Signed)
OB Discharge Summary     Patient Name: [name] DOB: [birth year] MRN: [id number]  Date of admission: 08/13/2015 Delivering MD: This patient has no babies on file.  Date of discharge: 08/13/2015  Admitting diagnosis: DYSPAURUNIA Intrauterine pregnancy: Unknown     Secondary diagnosis: s/p hysteroscopy complicated by uterine perforation with uterine sound     Discharge diagnosis: cervical stenosis                                                                                                   Complications: uterine perforation with uterine sound during hysteroscopy  Hospital course:  See op note. Pt was observed x 8 hours after hysteroscopy due to the uterine perforation. She had no pain, and tolerated regular diet without difficulty. She has been counselled over signs of acute abdomen , and will be seen in 48 hours our office, and phone interview tomorrow.  Physical exam  Filed Vitals:   08/13/15 1106 08/13/15 1333 08/13/15 1731 08/13/15 1847  BP: 107/90 119/79 146/90 150/59  Pulse: 61 69 64 73  Temp:  98.3 F (36.8 C)  98.7 F (37.1 C)  TempSrc:    Oral  Resp: 18 18 18 18   Height:      Weight:      SpO2: 100% 98% 100% 99%   General: alert, cooperative and no distress Lochia:  Uterine Fundus:  Incision:  DVT Evaluation: No evidence of DVT seen on physical exam. Labs: Lab Results  Component Value Date   WBC DUPLICATE REQUEST 37/85/8850   HGB DUPLICATE REQUEST 27/74/1287   HCT DUPLICATE REQUEST 86/76/7209   MCV DUPLICATE REQUEST 47/06/6282   PLT DUPLICATE REQUEST 66/29/4765   CMP Latest Ref Rng 08/13/2015  Glucose 65 - 99 mg/dL 101(H)  BUN 6 - 20 mg/dL 6  Creatinine 0.44 - 1.00 mg/dL 0.67  Sodium 135 - 145 mmol/L 138  Potassium 3.5 - 5.1 mmol/L 4.0  Chloride 101 - 111 mmol/L 108  CO2 22 - 32 mmol/L 23  Calcium 8.9 - 10.3 mg/dL 8.6(L)  Total Protein 6.0 - 8.3 g/dL -  Total Bilirubin 0.3 - 1.2 mg/dL -  Alkaline Phos 39 - 117 U/L -  AST 0 - 37 U/L -   ALT 0 - 35 U/L -    Discharge instruction: per After Visit Summary and "Baby and Me Booklet".  Medications:  Current facility-administered medications:  .  0.9 %  sodium chloride infusion, , Intravenous, Continuous, Jonnie Kind, MD, Last Rate: 75 mL/hr at 08/13/15 1104 .  ALPRAZolam Duanne Moron) tablet 2 mg, 2 mg, Oral, QID, Mallory Shirk V, MD, 2 mg at 08/13/15 1045 .  amLODipine (NORVASC) tablet 10 mg, 10 mg, Oral, Daily, Mallory Shirk V, MD, 10 mg at 08/13/15 1045 .  HYDROmorphone (DILAUDID) injection 1 mg, 1 mg, Intravenous, Q3H PRN, Jonnie Kind, MD .  Derrill Memo ON 08/18/2015] ibuprofen (ADVIL,MOTRIN) tablet 600 mg, 600 mg, Oral, Q6H PRN, Mallory Shirk V, MD .  ketorolac (TORADOL) 30 MG/ML injection 30 mg, 30 mg, Intravenous, 4 times per day, 30 mg at 08/13/15 1538 **OR** ketorolac (  TORADOL) 30 MG/ML injection 30 mg, 30 mg, Intramuscular, 4 times per day, Jonnie Kind, MD .  ondansetron [facility]) tablet 4 mg, 4 mg, Oral, Q6H PRN **OR** ondansetron (ZOFRAN) injection 4 mg, 4 mg, Intravenous, Q6H PRN, Jonnie Kind, MD .  oxyCODONE-acetaminophen (PERCOCET/ROXICET) 5-325 MG per tablet 1-2 tablet, 1-2 tablet, Oral, Q4H PRN, Jonnie Kind, MD .  pantoprazole (PROTONIX) EC tablet 40 mg, 40 mg, Oral, BID AC, Jonnie Kind, MD, 40 mg at 08/13/15 1729  Diet: routine diet  Activity: Advance as tolerated. Pelvic rest for 6 weeks.   Outpatient follow up:2 days       08/13/2015 Jonnie Kind, MD

## 2015-08-13 NOTE — Op Note (Signed)
08/13/2015  8:35 AM  PATIENT:  [name]  34 y.o. female  PRE-OPERATIVE DIAGNOSIS:  DYSPAUREUNIA, dysmenorrhea  POST-OPERATIVE DIAGNOSIS:  DYSPAURUNIA, dysmenorrhea, Cervical stenosis at old cesarean line, suspected uterine perforation  PROCEDURE:  Procedure(s): DILATATION AND CURETTAGE /HYSTEROSCOPY (N/A)  SURGEON:  Surgeon(s) and Role:    * Jonnie Kind, MD - Primary  PHYSICIAN ASSISTANT:   ASSISTANTS: none   ANESTHESIA:   general and paracervical block  EBL:  Total I/O In: 800 [I.V.:800] Out: 10 [Blood:10]  BLOOD ADMINISTERED:none  DRAINS: none   LOCAL MEDICATIONS USED:  MARCAINE    and Amount: 20 ml  SPECIMEN:  No Specimen  DISPOSITION OF SPECIMEN:  N/A  COUNTS:  YES  TOURNIQUET:  * No tourniquets in log *  DICTATION: .Dragon Dictation  PLAN OF CARE: Admit for overnight observation  PATIENT DISPOSITION:  PACU - hemodynamically stable.   Delay start of Pharmacological VTE agent (>24hrs) due to surgical blood loss or risk of bleeding: not applicable  Details of procedure: Patient was taken to the operating room prepped and draped for vaginal procedure with timeout conducted and surgical procedure confirmed by operative team. Speculum was inserted the cervix grasped with single-tooth tenaculum, and paracervical block with 20 cc of Marcaine with epinephrine injected around the cervix. Uterine sounding was attempted and cervical stenosis was encountered approximately 2 cm to 3 cm inside the cervix, allow passage of the uterine sound. An #11 dilator was then used and was able to identify the endocervical canal and the uterus was sounded to 9 cm. There was an area of significant resistance  felt to be the level of the old cesarean section scars. The the cervix was serially dilated up to 66 [place] and a slow fashion due to the significant resistance there was bleeding encountered at the level of the suspected cervical fibrosis and stenosis. Once the cervix was dilated  to 31 [place], efforts at hysteroscopy were attempted. We were using the 30 operative hysteroscope and efforts to insert the camera were difficult due to the area of stenosis, but we were able to gradually get the cervix through the stenotic area into the endometrial cavity and take photos of the tubal ostia and the endometrial cavity which was appropriately shaggy for the patient's  menstrual cycle status. No areas of uterine perforation were identified Since the ablation device requires a 25 French dilation, the camera was removed and efforts made to dilate the lower uterine segment 25 [place]. This was completed, there was greater than usual difficulty due to the degree of stenosis and resistance. The smooth sharp curet was then introduced into the uterine cavity with ease due to its smaller diameter, but the curet seemed to go too far suggesting uterine perforation. Curet was removed and efforts to repeat the hysteroscopy were attempted. Despite several minutes of efforts of gently rotating the camera back and forth to get through the lower uterine segment we were unable to get the camera back again through the stenotic lower uterine segment. At this time efforts that further active mentation of suspected perforation were discontinued. There was no bleeding from the uterus at the end of the procedure. Fluid used for the hysteroscopy was then suctioned from the bag at the end of the table, and there was an approximately 900 cc deficit between fluids used and recovered fluid. It was felt likely that during the efforts a repeat hysteroscopy that some fluid had intravasation through the suspected perforation into the abdominal cavity. This should be reabsorbed  easily. Procedure was discontinued. Patient was allowed to awaken and go recovery room where her comfort levels were considered normal. She will be observed overnight, given antibiotic coverage, times one, and reassessed periodically. Condition to  recovery room stable. Sponge and needle counts correct.

## 2015-08-13 NOTE — Anesthesia Preprocedure Evaluation (Signed)
Anesthesia Evaluation  Patient identified by MRN, date of birth, ID band Patient awake    Reviewed: Allergy & Precautions, NPO status , Patient's Chart, lab work & pertinent test results  Airway Mallampati: II  TM Distance: >3 FB     Dental  (+) Teeth Intact   Pulmonary former smoker,    breath sounds clear to auscultation       Cardiovascular hypertension, Pt. on medications  Rhythm:Regular Rate:Normal     Neuro/Psych PSYCHIATRIC DISORDERS Anxiety    GI/Hepatic GERD  Controlled and Medicated,  Endo/Other    Renal/GU      Musculoskeletal  (+) Arthritis ,   Abdominal   Peds  Hematology   Anesthesia Other Findings Raspy voice - chronic  Reproductive/Obstetrics                             Anesthesia Physical Anesthesia Plan  ASA: II  Anesthesia Plan: General   Post-op Pain Management:    Induction: Intravenous  Airway Management Planned: LMA  Additional Equipment:   Intra-op Plan:   Post-operative Plan: Extubation in OR  Informed Consent: I have reviewed the patients History and Physical, chart, labs and discussed the procedure including the risks, benefits and alternatives for the proposed anesthesia with the patient or authorized representative who has indicated his/her understanding and acceptance.     Plan Discussed with:   Anesthesia Plan Comments:         Anesthesia Quick Evaluation

## 2015-08-13 NOTE — Interval H&P Note (Signed)
History and Physical Interval Note:  08/13/2015 7:24 AM  [name]  has presented today for surgery, with the diagnosis of DYSPAREUNIA , with heavy menses. The various methods of treatment have been discussed with the patient and family. After consideration of risks, benefits and other options for treatment, the patient has consented to  Procedure(s): [place] (N/A) as a surgical intervention .  The patient's history has been reviewed, patient examined, no change in status, stable for surgery.  I have reviewed the patient's chart and labs.  Questions were answered to the patient's satisfaction.     [name]

## 2015-08-13 NOTE — H&P (View-Only) (Signed)
Expand All Collapse All   Patient ID: [name], female DOB: [birth year], 34 y.o. MRN: [id number] Pt here today for pre op visit. Pt wants to discuss her options and which surgery will be best for her.             Jonnie Kind, MD at 08/07/2015 9:27 AM     Status: Signed       Expand All Collapse All   Patient ID: [name], female DOB: 1981/05/20, 34 y.o. MRN: [id number]  Preoperative History and Physical  [name] is a 34 y.o. [id number] here for surgical management of cramping/bleeding and dyspareunia. No significant preoperative concerns. Pt had an US done last month s/p IUD removal. Pt has been having ongoing cramping and bleeding. She states since removal of her IUD that dyspareunia has begun to resolve. She states her LNMP was around 07/11/15.   Proposed surgery: hysteroscopy, D&C endometrial ablation.    Past Medical History  Diagnosis Date  . Anxiety   . Acid reflux   . Hypertension    Past Surgical History  Procedure Laterality Date  . Ectopic pregnancy surgery    . Cholecystectomy    . Bilateral salpingectomy    . Cesarean section      x2  . Arm surgery Left     plate in arm  . Leg surgery Left     femur rodding and removal of part of left femur  . Bone biopsy Right     thumb  . Brain surgery      evacuation of hematoma  . Esophagogastroduodenoscopy (egd) with propofol N/A 01/30/2014    Procedure: ESOPHAGOGASTRODUODENOSCOPY (EGD) WITH PROPOFOL; Surgeon: Danie Binder, MD; Location: [facility]; Service: Endoscopy; Laterality: N/A;   OB History  Gravida Para Term Preterm AB SAB TAB Ectopic Multiple Living  5 2 0 0 3 1 0 2 0 2     # Outcome Date GA Lbr Len/2nd Weight Sex Delivery Anes PTL Lv  5 Para 01/20/04    M CS-Unspec   Y  4 Para 07/27/99    M CS-Unspec   Y  3 SAB           2 Ectopic            1 Ectopic             Patient denies any other pertinent gynecologic issues.   Current Outpatient Prescriptions on File Prior to Visit  Medication Sig Dispense Refill  . alprazolam (XANAX) 2 MG tablet Take 1 tablet (2 mg total) by mouth 4 (four) times daily. 30 tablet 0  . amLODipine (NORVASC) 10 MG tablet Take 10 mg by mouth daily.    Marland Kitchen HYDROcodone-acetaminophen (NORCO) 10-325 MG per tablet Take 1 tablet by mouth every 6 (six) hours as needed for moderate pain.    Marland Kitchen loratadine (CLARITIN) 10 MG tablet Take 10 mg by mouth daily.    . ondansetron (ZOFRAN) 8 MG tablet Take 1 tablet (8 mg total) by mouth every 8 (eight) hours as needed. 60 tablet 0  . pantoprazole (PROTONIX) 40 MG tablet Take 1 tablet (40 mg total) by mouth 2 (two) times daily before a meal. 60 tablet 1   No current facility-administered medications on file prior to visit.   Allergies  Allergen Reactions  . Demerol Nausea Only  . Orange Fruit [Citrus]     Fever blisters    Social History:  reports that she quit smoking about 3 years ago. Her smoking  use included Cigarettes. She has a 20 pack-year smoking history. She has never used smokeless tobacco. She reports that she does not drink alcohol or use illicit drugs.  Family History  Problem Relation Age of Onset  . Arthritis    . Cancer    . Diabetes    . Cancer Maternal Grandfather     GASTRIC  . Mental illness Mother   . Diabetes Mother   . Obesity Mother   . Obesity Sister   . Diabetes Son     boarderline    Review of Systems: 10 Systems reviewed and all are negative for acute change except as noted in the HPI.    PHYSICAL EXAM: Blood pressure 130/90, height 5\' 3"  (1.6 m), weight 194 lb (87.998 kg). General appearance - alert, well appearing, and in no distress Abdomen - soft, nontender, nondistended Pelvic -Small uterus, anteflexed,  excellent support Extremities - peripheral pulses normal, no pedal edema, no clubbing or cyanosis   Labs: No results found for this or any previous visit (from the past 336 hour(s)).  Imaging Studies:  Imaging Results    US Transvaginal Non-ob  07/18/2015 GYNECOLOGIC SONOGRAM [name] is a 34 y.o. [id number] LMP unknown,s/p IUD removal, patient is here for a pelvic sonogram for cramping/bleeding,pre op ablation. Uterus 7.8 x 5.69 x 4.3 cm, normal anteverted uterus Endometrium 5.1 mm, symmetrical, wnl Right ovary 3.2 x 2.39 x 1.79 cm, wnl Left ovary 2.6 x 2.2 x 2.3 cm, wnl, simple dominate follicle 2.1 x 1.7 x 1.3 Technician Comments: PELVIC US TA/TV:normal anteverted uterus,EEC 5.76mm,normal ov's bilat (mobile),lt ov dominant follicle 2.1 x 1.7 x 8.7OM,VE free fluid seen,no pain during ultrasound [employer] 07/12/2015 1:43 PM Clinical Impression and recommendations: I have reviewed the sonogram results above. Combined with the patient's current clinical course, below are my impressions and any appropriate recommendations for management based on the sonographic findings: 1. Small anteflexed uterus, with smooth endometrium, thin at 5.1 mm, and symmetric, there is some heterogeneity of the myometrium on anterior abdominal wall, unable to rule out an area of adenomyosis. Normal ovaries with small simple cyst in left ovary consistent with normal corpus luteum cyst. [name]  US Pelvis Complete  07/18/2015 GYNECOLOGIC SONOGRAM [name] is a 34 y.o. [id number] LMP unknown,s/p IUD removal, patient is here for a pelvic sonogram for cramping/bleeding,pre op ablation. Uterus 7.8 x 5.69 x 4.3 cm, normal anteverted uterus Endometrium 5.1 mm, symmetrical, wnl Right ovary 3.2 x 2.39 x 1.79 cm, wnl Left ovary 2.6 x 2.2 x 2.3 cm, wnl, simple dominate follicle 2.1 x 1.7 x 1.3 Technician Comments:  PELVIC US TA/TV:normal anteverted uterus,EEC 5.79mm,normal ov's bilat (mobile),lt ov dominant follicle 2.1 x 1.7 x 9.6GE,ZM free fluid seen,no pain during ultrasound [employer] 07/12/2015 1:43 PM Clinical Impression and recommendations: I have reviewed the sonogram results above. Combined with the patient's current clinical course, below are my impressions and any appropriate recommendations for management based on the sonographic findings: 1. Small anteflexed uterus, with smooth endometrium, thin at 5.1 mm, and symmetric, there is some heterogeneity of the myometrium on anterior abdominal wall, unable to rule out an area of adenomyosis. Normal ovaries with small simple cyst in left ovary consistent with normal corpus luteum cyst. Evely Gainey V    Assessment: Patient Active Problem List   Diagnosis Date Noted  . IUD check up 06/27/2014  . Chronic pain 04/04/2014  . Chronic anxiety 04/04/2014  . Hypertension 04/04/2014  . Menorrhagia with regular cycle 03/21/2014  .  Dyspareunia 03/21/2014  . Nausea and vomiting 01/26/2014  . Impingement syndrome of shoulder region 12/21/2012  . Pain in joint, shoulder region 12/21/2012  . Elbow pain 07/02/2011  . Acromioclavicular (joint) (ligament) sprain 06/10/2011  . Pain in joint, upper arm 06/10/2011  . Muscle weakness (generalized) 06/10/2011  . Tennis elbow 04/23/2011  . 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 CELLULITIS, HAND 12/09/2010  . NEOPLASMS UNSPEC NATURE BONE SOFT TISSUE&SKIN 11/18/2010    Plan: Patient will undergo surgical management with hysteroscopically-guided D&C endometrial ablation.   08/07/2015 9:27 AM    By signing my name below, I, Erling Conte, attest that this documentation has been prepared under the direction and in the presence of Jonnie Kind, MD. Electronically Signed: Erling Conte, ED Scribe. 08/07/2015. 9:31 AM.  I personally performed the services described in  this documentation, which was SCRIBED in my presence. The recorded information has been reviewed and considered accurate. It has been edited as necessary during review. Jonnie Kind, MD

## 2015-08-13 NOTE — Brief Op Note (Signed)
08/13/2015  8:35 AM  PATIENT:  [name]  34 y.o. female  PRE-OPERATIVE DIAGNOSIS:  DYSPAUREUNIA, dysmenorrhea  POST-OPERATIVE DIAGNOSIS:  DYSPAURUNIA, dysmenorrhea, Cervical stenosis at old cesarean line, suspected uterine perforation  PROCEDURE:  Procedure(s): DILATATION AND CURETTAGE /HYSTEROSCOPY (N/A)  SURGEON:  Surgeon(s) and Role:    * Jonnie Kind, MD - Primary  PHYSICIAN ASSISTANT:   ASSISTANTS: none   ANESTHESIA:   general and paracervical block  EBL:  Total I/O In: 800 [I.V.:800] Out: 10 [Blood:10]  BLOOD ADMINISTERED:none  DRAINS: none   LOCAL MEDICATIONS USED:  MARCAINE    and Amount: 20 ml  SPECIMEN:  No Specimen  DISPOSITION OF SPECIMEN:  N/A  COUNTS:  YES  TOURNIQUET:  * No tourniquets in log *  DICTATION: .Dragon Dictation  PLAN OF CARE: Admit for overnight observation  PATIENT DISPOSITION:  PACU - hemodynamically stable.   Delay start of Pharmacological VTE agent (>24hrs) due to surgical blood loss or risk of bleeding: not applicable

## 2015-08-13 NOTE — Anesthesia Procedure Notes (Signed)
Procedure Name: LMA Insertion Date/Time: 08/13/2015 7:40 AM Performed by: Andree Elk, AMY A Pre-anesthesia Checklist: Timeout performed, Patient identified, Emergency Drugs available, Suction available and Patient being monitored Patient Re-evaluated:Patient Re-evaluated prior to inductionOxygen Delivery Method: Circle system utilized Preoxygenation: Pre-oxygenation with 100% oxygen Intubation Type: IV induction Ventilation: Mask ventilation without difficulty LMA: LMA inserted LMA Size: 4.0 Number of attempts: 1 Placement Confirmation: positive ETCO2 and breath sounds checked- equal and bilateral Tube secured with: Tape Dental Injury: Teeth and Oropharynx as per pre-operative assessment

## 2015-08-13 NOTE — Anesthesia Postprocedure Evaluation (Signed)
  Anesthesia Post-op Note  Patient: [name]  Procedure(s) Performed: Procedure(s): DILATATION AND CURETTAGE /HYSTEROSCOPY (N/A)  Patient Location: PACU  Anesthesia Type:General  Level of Consciousness: awake, alert , oriented and patient cooperative  Airway and Oxygen Therapy: Patient Spontanous Breathing and Patient connected to nasal cannula oxygen  Post-op Pain: mild  Post-op Assessment: Post-op Vital signs reviewed, Patient's Cardiovascular Status Stable, Respiratory Function Stable, Patent Airway, No signs of Nausea or vomiting and Pain level controlled              Post-op Vital Signs: Reviewed and stable  Last Vitals:  Filed Vitals:   08/13/15 0845  BP: 119/71  Pulse: 75  Temp:   Resp: 14    Complications: No apparent anesthesia complications

## 2015-08-14 ENCOUNTER — Encounter (HOSPITAL_COMMUNITY): Payer: Self-pay | Admitting: Obstetrics and Gynecology

## 2015-08-15 ENCOUNTER — Ambulatory Visit (INDEPENDENT_AMBULATORY_CARE_PROVIDER_SITE_OTHER): Payer: Medicaid Other | Admitting: Obstetrics and Gynecology

## 2015-08-15 ENCOUNTER — Encounter: Payer: Self-pay | Admitting: Obstetrics and Gynecology

## 2015-08-15 ENCOUNTER — Other Ambulatory Visit (INDEPENDENT_AMBULATORY_CARE_PROVIDER_SITE_OTHER): Payer: Medicaid Other

## 2015-08-15 ENCOUNTER — Other Ambulatory Visit: Payer: Self-pay | Admitting: Obstetrics and Gynecology

## 2015-08-15 ENCOUNTER — Other Ambulatory Visit: Payer: Self-pay | Admitting: *Deleted

## 2015-08-15 VITALS — BP 140/80 | Wt 191.0 lb

## 2015-08-15 DIAGNOSIS — R102 Pelvic and perineal pain: Secondary | ICD-10-CM | POA: Diagnosis not present

## 2015-08-15 DIAGNOSIS — N920 Excessive and frequent menstruation with regular cycle: Secondary | ICD-10-CM | POA: Diagnosis not present

## 2015-08-15 DIAGNOSIS — G8918 Other acute postprocedural pain: Secondary | ICD-10-CM

## 2015-08-15 DIAGNOSIS — N882 Stricture and stenosis of cervix uteri: Secondary | ICD-10-CM

## 2015-08-15 DIAGNOSIS — N946 Dysmenorrhea, unspecified: Secondary | ICD-10-CM

## 2015-08-15 LAB — CBC WITH DIFFERENTIAL/PLATELET
BASOS: 0 %
Basophils Absolute: 0 10*3/uL (ref 0.0–0.2)
EOS (ABSOLUTE): 0.1 10*3/uL (ref 0.0–0.4)
EOS: 1 %
HEMATOCRIT: 39.2 % (ref 34.0–46.6)
HEMOGLOBIN: 13.4 g/dL (ref 11.1–15.9)
LYMPHS: 22 %
Lymphocytes Absolute: 2.1 10*3/uL (ref 0.7–3.1)
MCH: 31.2 pg (ref 26.6–33.0)
MCHC: 34.2 g/dL (ref 31.5–35.7)
MCV: 91 fL (ref 79–97)
Monocytes Absolute: 1.4 10*3/uL — ABNORMAL HIGH (ref 0.1–0.9)
Monocytes: 15 %
NEUTROS ABS: 5.9 10*3/uL (ref 1.4–7.0)
NEUTROS PCT: 62 %
Platelets: 288 10*3/uL (ref 150–379)
RBC: 4.3 x10E6/uL (ref 3.77–5.28)
RDW: 12.5 % (ref 12.3–15.4)
WBC: 9.6 10*3/uL (ref 3.4–10.8)

## 2015-08-15 LAB — BASIC METABOLIC PANEL
BUN / CREAT RATIO: 8 (ref 8–20)
BUN: 6 mg/dL (ref 6–20)
CO2: 26 mmol/L (ref 18–29)
CREATININE: 0.75 mg/dL (ref 0.57–1.00)
Calcium: 8.9 mg/dL (ref 8.7–10.2)
Chloride: 106 mmol/L (ref 97–106)
GFR, EST AFRICAN AMERICAN: 119 mL/min/{1.73_m2} (ref >59)
GFR, EST NON AFRICAN AMERICAN: 104 mL/min/{1.73_m2} (ref >59)
Glucose: 94 mg/dL (ref 65–99)
POTASSIUM: 4.2 mmol/L (ref 3.5–5.2)
SODIUM: 139 mmol/L (ref 136–144)

## 2015-08-15 NOTE — Progress Notes (Signed)
Pelvic ultrasound today for pelvic pain.  S/P unsuccessful ablation. Normal appearing uterus measuring 8.2 x 3.7 x 6.3 cm.  Endometrium measures 4 mm with trace free fluid within. Right ovary appears normal, mobile and measures 2.8 x 1.7 x 1.6 cm. Left ovary appears normal, mobile and measures 3.1 x 2.8 x 2.4 cm. No evidence of free fluid in the adnexae bilaterally.

## 2015-08-15 NOTE — Progress Notes (Signed)
Patient ID: [name], female   DOB: 1980-11-13, 34 y.o.   MRN: [id number] Pt worked in today for heavy bleeding and severe pain that started last night. Pt states that she started having the pain at about 8:30 last night and then started with heavy bleeding about 3:30 this morning and is still bleeding and cramping.

## 2015-08-15 NOTE — Progress Notes (Signed)
Patient ID: [name], female   DOB: 1980-12-02, 34 y.o.   MRN: [id number] WORK-IN APPOINTMENT Subjective:  [name] is a 34 y.o. female now 2 days status post D&C/Hysteroscopy.  Presenting for a WORK-IN APPOINTMENT for heavy bleeding and severe suprapubic pain that began last night. Pt states that she started having the pain at about 8:30 PM last night and then started having heavy bleeding at 3:30 AM. She is still currently bleeding and cramping. Patient states she is due to have her menstrual period around this time.   Review of Systems Negative except as noted above   Diet:   Normal   Bowel movements : n/a   Objective:  BP 140/80 mmHg  Wt 191 lb (86.637 kg)  LMP 07/10/2015 (Approximate) General:Well developed, well nourished.  No acute distress. Abdomen: Bowel sounds normal, soft, non-tender. Pelvic Exam:    External Genitalia:  Normal.     Vagina: Moderate blood per vagina, nonpurulent.    Cervix: Normal    Uterus: Normal    Adnexa/Bimanual: Normal   Assessment:  Post-Op 2 days s/p D&C/Hysteroscopy.   1. Possible normal menses. No evidence of acute infection.  2. CBC and BMP ordered.   Plan:  1. Awaiting CBC. 2. Consider future hysterectomy if pt pain is simply dysmenorrhea 3. Continue with current pain management., ibuprofen,  4. F/u in 2 weeks.    By signing my name below, I, Stephania Fragmin, attest that this documentation has been prepared under the direction and in the presence of Jonnie Kind, MD. Electronically Signed: Stephania Fragmin, ED Scribe. 08/15/2015. 10:30 AM.  I personally performed the services described in this documentation, which was SCRIBED in my presence. The recorded information has been reviewed and considered accurate. It has been edited as necessary during review. Jonnie Kind, MD

## 2015-08-16 ENCOUNTER — Encounter: Payer: Medicaid Other | Admitting: Obstetrics and Gynecology

## 2015-08-20 ENCOUNTER — Encounter: Payer: Medicaid Other | Admitting: Obstetrics and Gynecology

## 2015-08-30 ENCOUNTER — Encounter: Payer: Self-pay | Admitting: Obstetrics and Gynecology

## 2015-08-30 ENCOUNTER — Ambulatory Visit (INDEPENDENT_AMBULATORY_CARE_PROVIDER_SITE_OTHER): Payer: Medicaid Other | Admitting: Obstetrics and Gynecology

## 2015-08-30 VITALS — BP 120/80 | Ht 63.0 in | Wt 191.0 lb

## 2015-08-30 DIAGNOSIS — IMO0001 Reserved for inherently not codable concepts without codable children: Secondary | ICD-10-CM

## 2015-08-30 DIAGNOSIS — Z09 Encounter for follow-up examination after completed treatment for conditions other than malignant neoplasm: Secondary | ICD-10-CM

## 2015-08-30 DIAGNOSIS — Z9889 Other specified postprocedural states: Secondary | ICD-10-CM

## 2015-08-30 DIAGNOSIS — S3769XD Other injury of uterus, subsequent encounter: Secondary | ICD-10-CM

## 2015-08-30 NOTE — Progress Notes (Signed)
Patient ID: [name], female   DOB: [birth year], 34 y.o.   MRN: [id number]  Subjective:  [name] is a 34 y.o. female now 2 weeks status post Dilation and curettage/hysteroscopy with uterine perforation (N/A).that occurred due to a very firm, resistant cervix during dilation from 23-to25 mm with uterine dilator Pt has no complaints today and wishes to discuss next steps. She is comfortable waiting x 3 months for review of menstrual pattern  Review of Systems Negative    Diet:   none   Bowel movements : normal.  The patient is not having any pain.  Objective:  BP 120/80 mmHg   Ht 5\' 3"  (1.6 m)   Wt 191 lb (86.637 kg)   BMI 33.84 kg/m2   LMP 08/14/2015 General:Well developed, well nourished.  No acute distress. Abdomen: Bowel sounds normal, soft, non-tender. Incision(s):   Healing internally, no drainage, no erythema, no hernia, no swelling, no dehiscence,   Assessment:  Post-Op 3 months s/p decision regarding ablation v. hysterectomy . May need laminaria before hysteroscopy if that is the choice  Doing well postoperatively.   Plan:  1.Wound care discussed   2. . current medications. none 3. Activity restrictions: none 4. return to work: not applicable. 5. Follow up in 3 months. 6. Reassess in three months with menstrual calendar consider laminaria prior to endometrial ablation v. abd hysterectomy.    By signing my name below, I, Terressa Koyanagi, attest that this documentation has been prepared under the direction and in the presence of Mallory Shirk, MD. Electronically Signed: Terressa Koyanagi, ED Scribe. 08/30/2015. 1:58 PM.   I personally performed the services described in this documentation, which was SCRIBED in my presence. The recorded information has been reviewed and considered accurate. It has been edited as necessary during review. Jonnie Kind, MD

## 2015-08-30 NOTE — Progress Notes (Signed)
Patient ID: [name], female   DOB: 08-Oct-1981, 34 y.o.   MRN: [id number] Pt here today for post op visit. Pt states that she feels MUCH better. Pt wants to discuss what's next.

## 2015-12-02 ENCOUNTER — Ambulatory Visit: Payer: Medicaid Other | Admitting: Obstetrics and Gynecology

## 2015-12-10 ENCOUNTER — Encounter: Payer: Self-pay | Admitting: Obstetrics and Gynecology

## 2015-12-10 ENCOUNTER — Ambulatory Visit: Payer: Medicaid Other | Admitting: Obstetrics and Gynecology

## 2016-08-24 DIAGNOSIS — Z1389 Encounter for screening for other disorder: Secondary | ICD-10-CM | POA: Diagnosis not present

## 2016-08-24 DIAGNOSIS — E6609 Other obesity due to excess calories: Secondary | ICD-10-CM | POA: Diagnosis not present

## 2016-08-24 DIAGNOSIS — G894 Chronic pain syndrome: Secondary | ICD-10-CM | POA: Diagnosis not present

## 2016-08-24 DIAGNOSIS — F419 Anxiety disorder, unspecified: Secondary | ICD-10-CM | POA: Diagnosis not present

## 2016-08-24 DIAGNOSIS — I10 Essential (primary) hypertension: Secondary | ICD-10-CM | POA: Diagnosis not present

## 2016-08-24 DIAGNOSIS — Z6830 Body mass index (BMI) 30.0-30.9, adult: Secondary | ICD-10-CM | POA: Diagnosis not present

## 2016-08-24 DIAGNOSIS — L02439 Carbuncle of limb, unspecified: Secondary | ICD-10-CM | POA: Diagnosis not present

## 2016-11-06 DIAGNOSIS — I10 Essential (primary) hypertension: Secondary | ICD-10-CM | POA: Diagnosis not present

## 2016-11-06 DIAGNOSIS — E6609 Other obesity due to excess calories: Secondary | ICD-10-CM | POA: Diagnosis not present

## 2016-11-06 DIAGNOSIS — F419 Anxiety disorder, unspecified: Secondary | ICD-10-CM | POA: Diagnosis not present

## 2016-11-06 DIAGNOSIS — J329 Chronic sinusitis, unspecified: Secondary | ICD-10-CM | POA: Diagnosis not present

## 2016-11-06 DIAGNOSIS — Z1389 Encounter for screening for other disorder: Secondary | ICD-10-CM | POA: Diagnosis not present

## 2016-11-06 DIAGNOSIS — Z6830 Body mass index (BMI) 30.0-30.9, adult: Secondary | ICD-10-CM | POA: Diagnosis not present

## 2016-11-06 DIAGNOSIS — G894 Chronic pain syndrome: Secondary | ICD-10-CM | POA: Diagnosis not present

## 2017-01-20 DIAGNOSIS — M353 Polymyalgia rheumatica: Secondary | ICD-10-CM | POA: Diagnosis not present

## 2017-01-20 DIAGNOSIS — J329 Chronic sinusitis, unspecified: Secondary | ICD-10-CM | POA: Diagnosis not present

## 2017-01-20 DIAGNOSIS — E6609 Other obesity due to excess calories: Secondary | ICD-10-CM | POA: Diagnosis not present

## 2017-01-20 DIAGNOSIS — Z6832 Body mass index (BMI) 32.0-32.9, adult: Secondary | ICD-10-CM | POA: Diagnosis not present

## 2017-01-20 DIAGNOSIS — Z1389 Encounter for screening for other disorder: Secondary | ICD-10-CM | POA: Diagnosis not present

## 2017-01-20 DIAGNOSIS — B349 Viral infection, unspecified: Secondary | ICD-10-CM | POA: Diagnosis not present

## 2017-01-27 DIAGNOSIS — Z6831 Body mass index (BMI) 31.0-31.9, adult: Secondary | ICD-10-CM | POA: Diagnosis not present

## 2017-01-27 DIAGNOSIS — F419 Anxiety disorder, unspecified: Secondary | ICD-10-CM | POA: Diagnosis not present

## 2017-01-27 DIAGNOSIS — M722 Plantar fascial fibromatosis: Secondary | ICD-10-CM | POA: Diagnosis not present

## 2017-01-27 DIAGNOSIS — I10 Essential (primary) hypertension: Secondary | ICD-10-CM | POA: Diagnosis not present

## 2017-01-27 DIAGNOSIS — Z1389 Encounter for screening for other disorder: Secondary | ICD-10-CM | POA: Diagnosis not present

## 2017-01-27 DIAGNOSIS — K219 Gastro-esophageal reflux disease without esophagitis: Secondary | ICD-10-CM | POA: Diagnosis not present

## 2017-05-06 DIAGNOSIS — E6609 Other obesity due to excess calories: Secondary | ICD-10-CM | POA: Diagnosis not present

## 2017-05-06 DIAGNOSIS — G894 Chronic pain syndrome: Secondary | ICD-10-CM | POA: Diagnosis not present

## 2017-05-06 DIAGNOSIS — Z6831 Body mass index (BMI) 31.0-31.9, adult: Secondary | ICD-10-CM | POA: Diagnosis not present

## 2017-05-06 DIAGNOSIS — L309 Dermatitis, unspecified: Secondary | ICD-10-CM | POA: Diagnosis not present

## 2017-05-06 DIAGNOSIS — Z1389 Encounter for screening for other disorder: Secondary | ICD-10-CM | POA: Diagnosis not present

## 2017-05-06 DIAGNOSIS — F419 Anxiety disorder, unspecified: Secondary | ICD-10-CM | POA: Diagnosis not present

## 2017-07-28 DIAGNOSIS — L03011 Cellulitis of right finger: Secondary | ICD-10-CM | POA: Diagnosis not present

## 2017-07-28 DIAGNOSIS — Z6831 Body mass index (BMI) 31.0-31.9, adult: Secondary | ICD-10-CM | POA: Diagnosis not present

## 2017-07-28 DIAGNOSIS — E6609 Other obesity due to excess calories: Secondary | ICD-10-CM | POA: Diagnosis not present

## 2017-07-28 DIAGNOSIS — F419 Anxiety disorder, unspecified: Secondary | ICD-10-CM | POA: Diagnosis not present

## 2017-07-28 DIAGNOSIS — Z1389 Encounter for screening for other disorder: Secondary | ICD-10-CM | POA: Diagnosis not present

## 2017-07-28 DIAGNOSIS — G894 Chronic pain syndrome: Secondary | ICD-10-CM | POA: Diagnosis not present

## 2017-10-21 DIAGNOSIS — I10 Essential (primary) hypertension: Secondary | ICD-10-CM | POA: Diagnosis not present

## 2017-10-21 DIAGNOSIS — E6609 Other obesity due to excess calories: Secondary | ICD-10-CM | POA: Diagnosis not present

## 2017-10-21 DIAGNOSIS — Z6832 Body mass index (BMI) 32.0-32.9, adult: Secondary | ICD-10-CM | POA: Diagnosis not present

## 2017-10-21 DIAGNOSIS — F419 Anxiety disorder, unspecified: Secondary | ICD-10-CM | POA: Diagnosis not present

## 2017-10-21 DIAGNOSIS — G894 Chronic pain syndrome: Secondary | ICD-10-CM | POA: Diagnosis not present

## 2017-10-21 DIAGNOSIS — Z1389 Encounter for screening for other disorder: Secondary | ICD-10-CM | POA: Diagnosis not present

## 2017-10-21 DIAGNOSIS — J329 Chronic sinusitis, unspecified: Secondary | ICD-10-CM | POA: Diagnosis not present

## 2017-11-18 ENCOUNTER — Ambulatory Visit (INDEPENDENT_AMBULATORY_CARE_PROVIDER_SITE_OTHER): Payer: 59 | Admitting: Obstetrics and Gynecology

## 2017-11-18 ENCOUNTER — Other Ambulatory Visit: Payer: Self-pay

## 2017-11-18 ENCOUNTER — Encounter: Payer: Self-pay | Admitting: Obstetrics and Gynecology

## 2017-11-18 ENCOUNTER — Other Ambulatory Visit (HOSPITAL_COMMUNITY)
Admission: RE | Admit: 2017-11-18 | Discharge: 2017-11-18 | Disposition: A | Discharge status: Home / Self Care | Payer: 59 | Source: Ambulatory Visit | Attending: Obstetrics and Gynecology | Admitting: Obstetrics and Gynecology

## 2017-11-18 VITALS — BP 118/76 | HR 91 | Ht 64.0 in | Wt 181.0 lb

## 2017-11-18 DIAGNOSIS — Z01419 Encounter for gynecological examination (general) (routine) without abnormal findings: Secondary | ICD-10-CM | POA: Diagnosis not present

## 2017-11-18 NOTE — Progress Notes (Signed)
Assessment:  1. Annual Gyn Exam 2. Dysmenorrhea, progressively more painful periods as pt ages 37. Dyspareunia 4 hx uterine perforation with attempted hysteroscopy due to stenotic cervix. 5 Left gluteal skin tag, for removal. Plan:  1. pap smear done, next pap due in 3 years 2. return annually or prn 3    Annual mammogram advised after age 37 4    F/u in 4 weeks for pre-op appt  Subjective:  [name] is a 37 y.o. female [phone] who presents for annual exam. Patient's last menstrual period was 11/14/2017. The patient has complaints today of painful periods that last 3-4 days. The first day of her period is very painful. She is pushing out clots during her periods. She did not do any good with birth control. She has had an endometrial ablation with no relief. Pt is interested in a hysterectomy. She has had to miss work due to severity of pain. She endorses occasional dyspareunia in certain positions.   Last pap was 03/21/14, and normal.  The following portions of the patient's history were reviewed and updated as appropriate: allergies, current medications, past family history, past medical history, past social history, past surgical history and problem list. Past Medical History:  Diagnosis Date  . Acid reflux   . Anxiety   . Arthritis   . Environmental allergies   . Hypertension     Past Surgical History:  Procedure Laterality Date  . arm surgery Left    plate in arm  . BILATERAL SALPINGECTOMY    . BONE BIOPSY Right    thumb  . BRAIN SURGERY     evacuation of hematoma  . CESAREAN SECTION     x2  . CHOLECYSTECTOMY    . ECTOPIC PREGNANCY SURGERY     x2  . ESOPHAGOGASTRODUODENOSCOPY (EGD) WITH PROPOFOL N/A 01/30/2014   Procedure: ESOPHAGOGASTRODUODENOSCOPY (EGD) WITH PROPOFOL;  Surgeon: Danie Binder, MD;  Location: [facility];  Service: Endoscopy;  Laterality: N/A;  . HYSTEROSCOPY W/D&C N/A 08/13/2015   Procedure: DILATATION AND CURETTAGE /HYSTEROSCOPY;  Surgeon: Jonnie Kind, MD;  Location: [facility];  Service: Gynecology;  Laterality: N/A;  . LEG SURGERY Left    femur rodding and removal of part of left femur  . otif ankle Right      Current Outpatient Medications:  .  alprazolam (XANAX) 2 MG tablet, Take 1 tablet (2 mg total) by mouth 4 (four) times daily., Disp: 30 tablet, Rfl: 0 .  amLODipine (NORVASC) 10 MG tablet, Take 10 mg by mouth daily., Disp: , Rfl:  .  HYDROcodone-acetaminophen (NORCO) 10-325 MG per tablet, Take 1 tablet by mouth every 6 (six) hours as needed for moderate pain., Disp: , Rfl:  .  pantoprazole (PROTONIX) 40 MG tablet, Take 1 tablet (40 mg total) by mouth 2 (two) times daily before a meal., Disp: 60 tablet, Rfl: 1  Review of Systems Constitutional: negative Gastrointestinal: negative Genitourinary: negative other than HPI                        Debilitating cramps with clot passage in first few days of menses.  Objective:  BP 118/76 (BP Location: Left Arm, Patient Position: Sitting, Cuff Size: Normal)   Pulse 91   Ht 5\' 4"  (1.626 m)   Wt 181 lb (82.1 kg)   LMP 11/14/2017   BMI 31.07 kg/m    BMI: Body mass index is 31.07 kg/m.  General Appearance: Alert, appropriate appearance for age. No acute  distress HEENT: Grossly normal Back: No CVAT Breast Exam: No masses or nodes.No dimpling, nipple retraction or discharge. Gastrointestinal: Soft, non-tender, no masses or organomegaly Pelvic Exam:  External genitalia: normal general appearance, small skin tag present on left perineal body, desires removal Vaginal: normal mucosa without prolapse or lesions, normal without tenderness, induration or masses and normal rugae Cervix: normal appearance Adnexa: removed surgically Uterus: normal single, nontender, anteflexed Rectovaginal: not indicated Skin: no rash or abnormalities Neurologic: Normal gait and speech, no tremor  Psychiatric: Alert and oriented, appropriate affect.  Urinalysis:Not done  Mallory Shirk. MD Pgr  [phone] 4:03 PM   By signing my name below, I, Izna Ahmed, attest that this documentation has been prepared under the direction and in the presence of Jonnie Kind, MD. Electronically Signed: Jabier Gauss, Medical Scribe. 11/18/17. 4:03 PM.  I personally performed the services described in this documentation, which was SCRIBED in my presence. The recorded information has been reviewed and considered accurate. It has been edited as necessary during review. Jonnie Kind, MD

## 2017-11-23 LAB — CYTOLOGY - PAP
Diagnosis: NEGATIVE
HPV: NOT DETECTED

## 2017-12-16 ENCOUNTER — Encounter: Payer: Self-pay | Admitting: Obstetrics and Gynecology

## 2017-12-16 ENCOUNTER — Ambulatory Visit: Payer: 59 | Admitting: Obstetrics and Gynecology

## 2017-12-16 VITALS — BP 120/90 | HR 98 | Ht 63.2 in | Wt 181.4 lb

## 2017-12-16 DIAGNOSIS — Z01818 Encounter for other preprocedural examination: Secondary | ICD-10-CM

## 2017-12-16 DIAGNOSIS — Z113 Encounter for screening for infections with a predominantly sexual mode of transmission: Secondary | ICD-10-CM

## 2017-12-16 NOTE — Progress Notes (Signed)
Patient ID: [name], female   DOB: 1981-01-09, 37 y.o.   MRN: [id number] Preoperative History and Physical  [name] is a 37 y.o. [id number] here for surgical management of dysmenorrhea and dyspareunia. No significant preoperative concerns.  Proposed surgery: Abdominal Supracervical Hysterectomy, Wide excision of old c-section scar and removal of left Gluteal Skin Tag  Past Medical History:  Diagnosis Date  . Acid reflux   . Anxiety   . Arthritis   . Environmental allergies   . Hypertension    Past Surgical History:  Procedure Laterality Date  . arm surgery Left    plate in arm  . BILATERAL SALPINGECTOMY    . BONE BIOPSY Right    thumb  . BRAIN SURGERY     evacuation of hematoma  . CESAREAN SECTION     x2  . CHOLECYSTECTOMY    . ECTOPIC PREGNANCY SURGERY     x2  . ESOPHAGOGASTRODUODENOSCOPY (EGD) WITH PROPOFOL N/A 01/30/2014   Procedure: ESOPHAGOGASTRODUODENOSCOPY (EGD) WITH PROPOFOL;  Surgeon: Danie Binder, MD;  Location: [facility];  Service: Endoscopy;  Laterality: N/A;  . HYSTEROSCOPY W/D&C N/A 08/13/2015   Procedure: DILATATION AND CURETTAGE /HYSTEROSCOPY;  Surgeon: Jonnie Kind, MD;  Location: [facility];  Service: Gynecology;  Laterality: N/A;  . LEG SURGERY Left    femur rodding and removal of part of left femur  . otif ankle Right    OB History  Gravida Para Term Preterm AB Living  5 2 0 0 3 2  SAB TAB Ectopic Multiple Live Births  1 0 2 0 2    # Outcome Date GA Lbr Len/2nd Weight Sex Delivery Anes PTL Lv  5 Para 01/20/04    M CS-Unspec   LIV  4 Para 07/27/99    M CS-Unspec   LIV  3 SAB           2 Ectopic           1 Ectopic             Patient denies any other pertinent gynecologic issues.   Current Outpatient Medications on File Prior to Visit  Medication Sig Dispense Refill  . alprazolam (XANAX) 2 MG tablet Take 1 tablet (2 mg total) by mouth 4 (four) times daily. 30 tablet 0  . amLODipine (NORVASC) 10 MG tablet Take 10 mg by mouth daily.    Marland Kitchen  HYDROcodone-acetaminophen (NORCO) 10-325 MG per tablet Take 1 tablet by mouth every 6 (six) hours as needed for moderate pain.    . pantoprazole (PROTONIX) 40 MG tablet Take 1 tablet (40 mg total) by mouth 2 (two) times daily before a meal. 60 tablet 1   No current facility-administered medications on file prior to visit.    Allergies  Allergen Reactions  . Demerol Nausea Only  . Orange Fruit [Citrus]     Fever blisters    Social History:   reports that she quit smoking about 6 years ago. Her smoking use included cigarettes. She has a 20.00 pack-year smoking history. she has never used smokeless tobacco. She reports that she does not drink alcohol or use drugs.  Family History  Problem Relation Age of Onset  . Mental illness Mother   . Diabetes Mother   . Obesity Mother   . Obesity Sister   . Diabetes Son        boarderline  . Arthritis Unknown   . Cancer Unknown   . Diabetes Unknown   . Cancer Maternal Grandfather  GASTRIC    Review of Systems: Noncontributory  PHYSICAL EXAM: Pulse 98, height 5' 3.2" (1.605 m), weight 181 lb 6.4 oz (82.3 kg), last menstrual period 12/13/2017. General appearance - alert, well appearing, and in no distress Chest - clear to auscultation, no wheezes, rales or rhonchi, symmetric air entry Heart - normal rate and regular rhythm Abdomen - soft, nontender, nondistended, no masses or organomegaly, laxity and old c-section scar x2 Pelvic - examination  External genitalia - normal Cervix - no liparous  Adnexa - non-tender Extremities - peripheral pulses normal, no pedal edema, no clubbing or cyanosis  Labs: No results found for this or any previous visit (from the past 336 hour(s)).  Imaging Studies: No results found.  Assessment: Patient Active Problem List   Diagnosis Date Noted  . Stricture and stenosis of cervix uteri 08/13/2015  . 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 IUD check up 06/27/2014  . Chronic pain  04/04/2014  . Chronic anxiety 04/04/2014  . Hypertension 04/04/2014  . Menorrhagia with regular cycle 03/21/2014  . Dyspareunia 03/21/2014  . Nausea and vomiting 01/26/2014  . Impingement syndrome of shoulder region 12/21/2012  . Pain in joint, shoulder region 12/21/2012  . Elbow pain 07/02/2011  . Acromioclavicular (joint) (ligament) sprain 06/10/2011  . Pain in joint, upper arm 06/10/2011  . Muscle weakness (generalized) 06/10/2011  . Tennis elbow 04/23/2011  . 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 CELLULITIS, HAND 12/09/2010  . NEOPLASMS UNSPEC NATURE BONE SOFT TISSUE&SKIN 11/18/2010    Plan: 1. Patient will undergo surgical management with Abdominal Supracervical Hysterectomy, Wide excision of old c-section scar and removal of left Gluteal Skin Tag. 2. Will call to with date in March 2019 , likely March 12.   By signing my name below, I, Margit Banda, attest that this documentation has been prepared under the direction and in the presence of Jonnie Kind, MD. Electronically Signed: Margit Banda, Medical Scribe. 12/16/17. 3:53 PM.  I personally performed the services described in this documentation, which was SCRIBED in my presence. The recorded information has been reviewed and considered accurate. It has been edited as necessary during review. Jonnie Kind, MD

## 2017-12-18 LAB — GC/CHLAMYDIA PROBE AMP
Chlamydia trachomatis, NAA: NEGATIVE
NEISSERIA GONORRHOEAE BY PCR: NEGATIVE

## 2017-12-23 ENCOUNTER — Telehealth: Payer: Self-pay | Admitting: Obstetrics and Gynecology

## 2017-12-24 NOTE — Telephone Encounter (Signed)
Reviewed with patient and her experience with efforts to avoid hysterectomy and still control her menses.  Chart review and patient history reviewed with patient.  She has had an IUD which had to be removed within a year due to discomfort in continued bleeding, but the discomfort being a major factor in removal.  Patient describes it is my body rejected it".  We attempted an endometrial ablation but the very stenotic cervix resulted in technical difficulties dilating and sounding and we perforation and where had to discontinue the ablation process.  The patient is not interested in repeating attempts at endometrial ablation though I guess technically that could be reattempted . The patient has been on birth control pills of different types with several complaints of how she responded to the pills.  This is how we are getting to the point of considering supracervical hysterectomy to be done through her old C-section scars.  I will likely remove the old scars as a way of accessing the abdomen We will discuss this with insurance for prior approval which is currently being reviewed

## 2017-12-28 ENCOUNTER — Telehealth: Payer: Self-pay | Admitting: Obstetrics and Gynecology

## 2017-12-29 DIAGNOSIS — K219 Gastro-esophageal reflux disease without esophagitis: Secondary | ICD-10-CM | POA: Diagnosis not present

## 2017-12-29 DIAGNOSIS — F419 Anxiety disorder, unspecified: Secondary | ICD-10-CM | POA: Diagnosis not present

## 2017-12-29 DIAGNOSIS — Z6833 Body mass index (BMI) 33.0-33.9, adult: Secondary | ICD-10-CM | POA: Diagnosis not present

## 2017-12-29 DIAGNOSIS — G894 Chronic pain syndrome: Secondary | ICD-10-CM | POA: Diagnosis not present

## 2017-12-29 DIAGNOSIS — Z1389 Encounter for screening for other disorder: Secondary | ICD-10-CM | POA: Diagnosis not present

## 2017-12-30 ENCOUNTER — Other Ambulatory Visit (HOSPITAL_COMMUNITY): Payer: 59

## 2017-12-31 NOTE — Telephone Encounter (Signed)
Patient states she would like to just schedule appointment with Dr Glo Herring to discuss what to do next. Call transferred to [facility].

## 2017-12-31 NOTE — Telephone Encounter (Signed)
LMOVM letting patient know Dr Glo Herring has been out of the office until Monday.

## 2018-01-03 ENCOUNTER — Other Ambulatory Visit (HOSPITAL_COMMUNITY): Payer: 59

## 2018-01-03 ENCOUNTER — Ambulatory Visit: Payer: 59 | Admitting: Obstetrics and Gynecology

## 2018-01-03 ENCOUNTER — Encounter: Payer: Self-pay | Admitting: Obstetrics and Gynecology

## 2018-01-03 VITALS — BP 120/80 | HR 95 | Wt 179.2 lb

## 2018-01-03 DIAGNOSIS — N920 Excessive and frequent menstruation with regular cycle: Secondary | ICD-10-CM | POA: Diagnosis not present

## 2018-01-03 DIAGNOSIS — N882 Stricture and stenosis of cervix uteri: Secondary | ICD-10-CM

## 2018-01-03 NOTE — Progress Notes (Addendum)
Patient ID: [name], female   DOB: 17-May-1981, 37 y.o.   MRN: [id number]    [facility] Visit  @DATE @            Patient name: [name] MRN [id number]  Date of birth: [birth year]  CC & HPI:  [name] is a 37 y.o. female presenting today to talk about her options. She currently is having an issue with her health insurance.   Review of her requested abdominal supracervical hysterectomy was denied by insurance carrier.  We discussed a urgent appeal the patient has decided after consideration to have [place] reattempt the endometrial ablation, and I have explained to her that we would use of laminaria for several hours prior to the procedure to reduce the chances of recurrent repeat uterine perforation She denies fever, chills or any other symptoms or complaints at this time.   ROS:  ROS +dysmenorrhea debilitating pain with menses -fever -chills All systems are negative except as noted in the HPI and PMH.   Pertinent History Reviewed:   Reviewed: Significant for  Medical         Past Medical History:  Diagnosis Date  . Acid reflux   . Anxiety   . Arthritis   . Environmental allergies   . Hypertension                               Surgical Hx:    Past Surgical History:  Procedure Laterality Date  . arm surgery Left    plate in arm  . BILATERAL SALPINGECTOMY    . BONE BIOPSY Right    thumb  . BRAIN SURGERY     evacuation of hematoma  . CESAREAN SECTION     x2  . CHOLECYSTECTOMY    . ECTOPIC PREGNANCY SURGERY     x2  . ESOPHAGOGASTRODUODENOSCOPY (EGD) WITH PROPOFOL N/A 01/30/2014   Procedure: ESOPHAGOGASTRODUODENOSCOPY (EGD) WITH PROPOFOL;  Surgeon: Danie Binder, MD;  Location: [facility];  Service: Endoscopy;  Laterality: N/A;  . HYSTEROSCOPY W/D&C N/A 08/13/2015   Procedure: DILATATION AND CURETTAGE /HYSTEROSCOPY;  Surgeon: Jonnie Kind, MD;  Location: [facility];  Service: Gynecology;  Laterality: N/A;  . LEG SURGERY Left    femur rodding and removal of part of left  femur  . otif ankle Right    Medications: Reviewed & Updated - see associated section                       Current Outpatient Medications:  .  alprazolam (XANAX) 2 MG tablet, Take 1 tablet (2 mg total) by mouth 4 (four) times daily., Disp: 30 tablet, Rfl: 0 .  amLODipine (NORVASC) 10 MG tablet, Take 10 mg by mouth daily., Disp: , Rfl:  .  HYDROcodone-acetaminophen (NORCO) 10-325 MG per tablet, Take 1 tablet by mouth every 6 (six) hours as needed for moderate pain., Disp: , Rfl:  .  pantoprazole (PROTONIX) 40 MG tablet, Take 1 tablet (40 mg total) by mouth 2 (two) times daily before a meal., Disp: 60 tablet, Rfl: 1   Social History: Reviewed -  reports that she quit smoking about 6 years ago. Her smoking use included cigarettes. She has a 20.00 pack-year smoking history. she has never used smokeless tobacco.  Objective Findings:  Vitals: Blood pressure 120/80, pulse 95, weight 179 lb 3.2 oz (81.3 kg), last menstrual period 12/13/2017.  PHYSICAL EXAMINATION General appearance - alert,  well appearing, and in no distress Mental status - alert, oriented to person, place, and time Chest - clear to auscultation, no wheezes, rales or rhonchi, symmetric air entry Heart - normal rate and regular rhythm Abdomen - soft, nontender, nondistended, no masses or organomegaly Well-healed lower abdominal scar from cesarean Breasts -  Skin -   PELVIC not repeated see last visits exam    Assessment & Plan:   A:  1. Debilitating dysmenorrhea, status post sterilization by salpingectomy, status post cesarean section 2.  History of uterine perforation due to cervical stenosis during previous attempted endometrial ablation P:  1.  will proceed toward Hysteroscopy, D&C Endometrial Ablation on 26 March.    By signing my name below, I, Margit Banda, attest that this documentation has been prepared under the direction and in the presence of Jonnie Kind, MD. Electronically Signed: Margit Banda,  Medical Scribe. 01/03/18. 9:21 AM.  I personally performed the services described in this documentation, which was SCRIBED in my presence. The recorded information has been reviewed and considered accurate. It has been edited as necessary during review. Jonnie Kind, MD

## 2018-01-04 ENCOUNTER — Encounter (HOSPITAL_COMMUNITY): Admission: RE | Payer: Self-pay | Source: Ambulatory Visit

## 2018-01-04 ENCOUNTER — Inpatient Hospital Stay (HOSPITAL_COMMUNITY): Admission: RE | Admit: 2018-01-04 | Payer: 59 | Source: Ambulatory Visit | Admitting: Obstetrics and Gynecology

## 2018-01-04 SURGERY — HYSTERECTOMY, SUPRACERVICAL, ABDOMINAL
Anesthesia: General

## 2018-01-12 ENCOUNTER — Encounter: Payer: 59 | Admitting: Obstetrics and Gynecology

## 2018-01-13 ENCOUNTER — Other Ambulatory Visit (HOSPITAL_COMMUNITY): Payer: 59

## 2018-01-13 NOTE — Patient Instructions (Signed)
[name]  01/13/2018     @PREFPERIOPPHARMACY @   Your procedure is scheduled on  01/18/2018   Report to [facility] at  54  A.M.  Call this number if you have problems the morning of surgery:  [phone]   Remember:  Do not eat food or drink liquids after midnight.  Take these medicines the morning of surgery with A SIP OF WATER  Xanax, norvasc, hydrocodone, protonix.   Do not wear jewelry, make-up or nail polish.  Do not wear lotions, powders, or perfumes, or deodorant.  Do not shave 48 hours prior to surgery.  Men may shave face and neck.  Do not bring valuables to the hospital.  [facility] is not responsible for any belongings or valuables.  Contacts, dentures or bridgework may not be worn into surgery.  Leave your suitcase in the car.  After surgery it may be brought to your room.  For patients admitted to the hospital, discharge time will be determined by your treatment team.  Patients discharged the day of surgery will not be allowed to drive home.   Name and phone number of your driver:   family Special instructions:  None  Please read over the following fact sheets that you were given. Anesthesia Post-op Instructions and Care and Recovery After Surgery       Dilation and Curettage or Vacuum Curettage Dilation and curettage (D&C) and vacuum curettage are minor procedures. A D&C involves stretching (dilation) the cervix and scraping (curettage) the inside lining of the uterus (endometrium). During a D&C, tissue is gently scraped from the endometrium, starting from the top portion of the uterus down to the lowest part of the uterus (cervix). During a vacuum curettage, the lining and tissue in the uterus are removed with the use of gentle suction. Curettage may be performed to either diagnose or treat a problem. As a diagnostic procedure, curettage is performed to examine tissues from the uterus. A diagnostic curettage may be done  if you have:  Irregular bleeding in the uterus.  Bleeding with the development of clots.  Spotting between menstrual periods.  Prolonged menstrual periods or other abnormal bleeding.  Bleeding after menopause.  No menstrual period (amenorrhea).  A change in size and shape of the uterus.  Abnormal endometrial cells discovered during a Pap test.  As a treatment procedure, curettage may be performed for the following reasons:  Removal of an IUD (intrauterine device).  Removal of retained placenta after giving birth.  Abortion.  Miscarriage.  Removal of endometrial polyps.  Removal of uncommon types of noncancerous lumps (fibroids).  Tell a health care provider about:  Any allergies you have, including allergies to prescribed medicine or latex.  All medicines you are taking, including vitamins, herbs, eye drops, creams, and over-the-counter medicines. This is especially important if you take any blood-thinning medicine. Bring a list of all of your medicines to your appointment.  Any problems you or family members have had with anesthetic medicines.  Any blood disorders you have.  Any surgeries you have had.  Your medical history and any medical conditions you have.  Whether you are pregnant or may be pregnant.  Recent vaginal infections you have had.  Recent menstrual periods, bleeding problems you have had, and what form of birth control (contraception) you use. What are the risks? Generally, this is a safe procedure. However, problems may occur, including:  Infection.  Heavy vaginal bleeding.  Allergic reactions to medicines.  Damage to the cervix or other structures or organs.  Development of scar tissue (adhesions) inside the uterus, which can cause abnormal amounts of menstrual bleeding. This may make it harder to get pregnant in the future.  A hole (perforation) or puncture in the uterine wall. This is rare.  What happens before the  procedure? Staying hydrated Follow instructions from your health care provider about hydration, which may include:  Up to 2 hours before the procedure - you may continue to drink clear liquids, such as water, clear fruit juice, black coffee, and plain tea.  Eating and drinking restrictions Follow instructions from your health care provider about eating and drinking, which may include:  8 hours before the procedure - stop eating heavy meals or foods such as meat, fried foods, or fatty foods.  6 hours before the procedure - stop eating light meals or foods, such as toast or cereal.  6 hours before the procedure - stop drinking milk or drinks that contain milk.  2 hours before the procedure - stop drinking clear liquids. If your health care provider told you to take your medicine(s) on the day of your procedure, take them with only a sip of water.  Medicines  Ask your health care provider about: ? Changing or stopping your regular medicines. This is especially important if you are taking diabetes medicines or blood thinners. ? Taking medicines such as aspirin and ibuprofen. These medicines can thin your blood. Do not take these medicines before your procedure if your health care provider instructs you not to.  You may be given antibiotic medicine to help prevent infection. General instructions  For 24 hours before your procedure, do not: ? Douche. ? Use tampons. ? Use medicines, creams, or suppositories in the vagina. ? Have sexual intercourse.  You may be given a pregnancy test on the day of the procedure.  Plan to have someone take you home from the hospital or clinic.  You may have a blood or urine sample taken.  If you will be going home right after the procedure, plan to have someone with you for 24 hours. What happens during the procedure?  To reduce your risk of infection: ? Your health care team will wash or sanitize their hands. ? Your skin will be washed with  soap.  An IV tube will be inserted into one of your veins.  You will be given one of the following: ? A medicine that numbs the area in and around the cervix (local anesthetic). ? A medicine to make you fall asleep (general anesthetic).  You will lie down on your back, with your feet in foot rests (stirrups).  The size and position of your uterus will be checked.  A lubricated instrument (speculum or Sims retractor) will be inserted into the back side of your vagina. The speculum will be used to hold apart the walls of your vagina so your health care provider can see your cervix.  A tool (tenaculum) will be attached to the lip of the cervix to stabilize it.  Your cervix will be softened and dilated. This may be done by: ? Taking a medicine. ? Having tapered dilators or thin rods (laminaria) or gradual widening instruments (tapered dilators) inserted into your cervix.  A small, sharp, curved instrument (curette) will be used to scrape a small amount of tissue or cells from the endometrium or cervical canal. In some cases, gentle suction is applied with  the curette. The curette will then be removed. The cells will be taken to a lab for testing. The procedure may vary among health care providers and hospitals. What happens after the procedure?  You may have mild cramping, backache, pain, and light bleeding or spotting. You may pass small blood clots from your vagina.  You may have to wear compression stockings. These stockings help to prevent blood clots and reduce swelling in your legs.  Your blood pressure, heart rate, breathing rate, and blood oxygen level will be monitored until the medicines you were given have worn off. Summary  Dilation and curettage (D&C) involves stretching (dilation) the cervix and scraping (curettage) the inside lining of the uterus (endometrium).  After the procedure, you may have mild cramping, backache, pain, and light bleeding or spotting. You may pass  small blood clots from your vagina.  Plan to have someone take you home from the hospital or clinic. This information is not intended to replace advice given to you by your health care provider. Make sure you discuss any questions you have with your health care provider. Document Released: 10/12/2005 Document Revised: 06/28/2016 Document Reviewed: 06/28/2016 Elsevier Interactive Patient Education  2018 [employer].  Dilation and Curettage or Vacuum Curettage, Care After These instructions give you information about caring for yourself after your procedure. Your doctor may also give you more specific instructions. Call your doctor if you have any problems or questions after your procedure. Follow these instructions at home: Activity  Do not drive or use heavy machinery while taking prescription pain medicine.  For 24 hours after your procedure, avoid driving.  Take short walks often, followed by rest periods. Ask your doctor what activities are safe for you. After one or two days, you may be able to return to your normal activities.  Do not lift anything that is heavier than 10 lb (4.5 kg) until your doctor approves.  For at least 2 weeks, or as long as told by your doctor: ? Do not douche. ? Do not use tampons. ? Do not have sex. General instructions  Take over-the-counter and prescription medicines only as told by your doctor. This is very important if you take blood thinning medicine.  Do not take baths, swim, or use a hot tub until your doctor approves. Take showers instead of baths.  Wear compression stockings as told by your doctor.  It is up to you to get the results of your procedure. Ask your doctor when your results will be ready.  Keep all follow-up visits as told by your doctor. This is important. Contact a doctor if:  You have very bad cramps that get worse or do not get better with medicine.  You have very bad pain in your belly (abdomen).  You cannot drink  fluids without throwing up (vomiting).  You get pain in a different part of the area between your belly and thighs (pelvis).  You have bad-smelling discharge from your vagina.  You have a rash. Get help right away if:  You are bleeding a lot from your vagina. A lot of bleeding means soaking more than one sanitary pad in an hour, for 2 hours in a row.  You have clumps of blood (blood clots) coming from your vagina.  You have a fever or chills.  Your belly feels very tender or hard.  You have chest pain.  You have trouble breathing.  You cough up blood.  You feel dizzy.  You feel light-headed.  You pass  out (faint).  You have pain in your neck or shoulder area. Summary  Take short walks often, followed by rest periods. Ask your doctor what activities are safe for you. After one or two days, you may be able to return to your normal activities.  Do not lift anything that is heavier than 10 lb (4.5 kg) until your doctor approves.  Do not take baths, swim, or use a hot tub until your doctor approves. Take showers instead of baths.  Contact your doctor if you have any symptoms of infection, like bad-smelling discharge from your vagina. This information is not intended to replace advice given to you by your health care provider. Make sure you discuss any questions you have with your health care provider. Document Released: 07/21/2008 Document Revised: 06/29/2016 Document Reviewed: 06/29/2016 Elsevier Interactive Patient Education  2017 [place]. Hysteroscopy Hysteroscopy is a procedure used for looking inside the womb (uterus). It may be done for various reasons, including:  To evaluate abnormal bleeding, fibroid (benign, noncancerous) tumors, polyps, scar tissue (adhesions), and possibly cancer of the uterus.  To look for lumps (tumors) and other uterine growths.  To look for causes of why a woman cannot get pregnant (infertility), causes of recurrent loss of pregnancy  (miscarriages), or a lost intrauterine device (IUD).  To perform a sterilization by blocking the fallopian tubes from inside the uterus.  In this procedure, a thin, flexible tube with a tiny light and camera on the end of it (hysteroscope) is used to look inside the uterus. A hysteroscopy should be done right after a menstrual period to be sure you are not pregnant. LET [facility] CARE PROVIDER KNOW ABOUT:  Any allergies you have.  All medicines you are taking, including vitamins, herbs, eye drops, creams, and over-the-counter medicines.  Previous problems you or members of your family have had with the use of anesthetics.  Any blood disorders you have.  Previous surgeries you have had.  Medical conditions you have. RISKS AND COMPLICATIONS Generally, this is a safe procedure. However, as with any procedure, complications can occur. Possible complications include:  Putting a hole in the uterus.  Excessive bleeding.  Infection.  Damage to the cervix.  Injury to other organs.  Allergic reaction to medicines.  Too much fluid used in the uterus for the procedure.  BEFORE THE PROCEDURE  Ask your health care provider about changing or stopping any regular medicines.  Do not take aspirin or blood thinners for 1 week before the procedure, or as directed by your health care provider. These can cause bleeding.  If you smoke, do not smoke for 2 weeks before the procedure.  In some cases, a medicine is placed in the cervix the day before the procedure. This medicine makes the cervix have a larger opening (dilate). This makes it easier for the instrument to be inserted into the uterus during the procedure.  Do not eat or drink anything for at least 8 hours before the surgery.  Arrange for someone to take you home after the procedure. PROCEDURE  You may be given a medicine to relax you (sedative). You may also be given one of the following: ? A medicine that numbs the area around  the cervix (local anesthetic). ? A medicine that makes you sleep through the procedure (general anesthetic).  The hysteroscope is inserted through the vagina into the uterus. The camera on the hysteroscope sends a picture to a TV screen. This gives the surgeon a good view inside the  uterus.  During the procedure, air or a liquid is put into the uterus, which allows the surgeon to see better.  Sometimes, tissue is gently scraped from inside the uterus. These tissue samples are sent to a lab for testing. What to expect after the procedure  If you had a general anesthetic, you may be groggy for a couple hours after the procedure.  If you had a local anesthetic, you will be able to go home as soon as you are stable and feel ready.  You may have some cramping. This normally lasts for a couple days.  You may have bleeding, which varies from light spotting for a few days to menstrual-like bleeding for 3-7 days. This is normal.  If your test results are not back during the visit, make an appointment with your health care provider to find out the results. This information is not intended to replace advice given to you by your health care provider. Make sure you discuss any questions you have with your health care provider. Document Released: 01/18/2001 Document Revised: 03/19/2016 Document Reviewed: 05/11/2013 Elsevier Interactive Patient Education  2017 [place]. Hysteroscopy, Care After Refer to this sheet in the next few weeks. These instructions provide you with information on caring for yourself after your procedure. Your health care provider may also give you more specific instructions. Your treatment has been planned according to current medical practices, but problems sometimes occur. Call your health care provider if you have any problems or questions after your procedure. What can I expect after the procedure? After your procedure, it is typical to have the following:  You may have  some cramping. This normally lasts for a couple days.  You may have bleeding. This can vary from light spotting for a few days to menstrual-like bleeding for 3-7 days.  Follow these instructions at home:  Rest for the first 1-2 days after the procedure.  Only take over-the-counter or prescription medicines as directed by your health care provider. Do not take aspirin. It can increase the chances of bleeding.  Take showers instead of baths for 2 weeks or as directed by your health care provider.  Do not drive for 24 hours or as directed.  Do not drink alcohol while taking pain medicine.  Do not use tampons, douche, or have sexual intercourse for 2 weeks or until your health care provider says it is okay.  Take your temperature twice a day for 4-5 days. Write it down each time.  Follow your health care provider's advice about diet, exercise, and lifting.  If you develop constipation, you may: ? Take a mild laxative if your health care provider approves. ? Add bran foods to your diet. ? Drink enough fluids to keep your urine clear or pale yellow.  Try to have someone with you or available to you for the first 24-48 hours, especially if you were given a general anesthetic.  Follow up with your health care provider as directed. Contact a health care provider if:  You feel dizzy or lightheaded.  You feel sick to your stomach (nauseous).  You have abnormal vaginal discharge.  You have a rash.  You have pain that is not controlled with medicine. Get help right away if:  You have bleeding that is heavier than a normal menstrual period.  You have a fever.  You have increasing cramps or pain, not controlled with medicine.  You have new belly (abdominal) pain.  You pass out.  You have pain in the  tops of your shoulders (shoulder strap areas).  You have shortness of breath. This information is not intended to replace advice given to you by your health care provider. Make  sure you discuss any questions you have with your health care provider. Document Released: 08/02/2013 Document Revised: 03/19/2016 Document Reviewed: 05/11/2013 Elsevier Interactive Patient Education  2017 [place].  Endometrial Ablation Endometrial ablation is a procedure that destroys the thin inner layer of the lining of the uterus (endometrium). This procedure may be done:  To stop heavy periods.  To stop bleeding that is causing anemia.  To control irregular bleeding.  To treat bleeding caused by small tumors (fibroids) in the endometrium.  This procedure is often an alternative to major surgery, such as removal of the uterus and cervix (hysterectomy). As a result of this procedure:  You may not be able to have children. However, if you are premenopausal (you have not gone through menopause): ? You may still have a small chance of getting pregnant. ? You will need to use a reliable method of birth control after the procedure to prevent pregnancy.  You may stop having a menstrual period, or you may have only a small amount of bleeding during your period. Menstruation may return several years after the procedure.  Tell a health care provider about:  Any allergies you have.  All medicines you are taking, including vitamins, herbs, eye drops, creams, and over-the-counter medicines.  Any problems you or family members have had with the use of anesthetic medicines.  Any blood disorders you have.  Any surgeries you have had.  Any medical conditions you have. What are the risks? Generally, this is a safe procedure. However, problems may occur, including:  A hole (perforation) in the uterus or bowel.  Infection of the uterus, bladder, or vagina.  Bleeding.  Damage to other structures or organs.  An air bubble in the lung (air embolus).  Problems with pregnancy after the procedure.  Failure of the procedure.  Decreased ability to diagnose cancer in the  endometrium.  What happens before the procedure?  You will have tests of your endometrium to make sure there are no pre-cancerous cells or cancer cells present.  You may have an ultrasound of the uterus.  You may be given medicines to thin the endometrium.  Ask your health care provider about: ? Changing or stopping your regular medicines. This is especially important if you take diabetes medicines or blood thinners. ? Taking medicines such as aspirin and ibuprofen. These medicines can thin your blood. Do not take these medicines before your procedure if your doctor tells you not to.  Plan to have someone take you home from the hospital or clinic. What happens during the procedure?  You will lie on an exam table with your feet and legs supported as in a pelvic exam.  To lower your risk of infection: ? Your health care team will wash or sanitize their hands and put on germ-free (sterile) gloves. ? Your genital area will be washed with soap.  An IV tube will be inserted into one of your veins.  You will be given a medicine to help you relax (sedative).  A surgical instrument with a light and camera (resectoscope) will be inserted into your vagina and moved into your uterus. This allows your surgeon to see inside your uterus.  Endometrial tissue will be removed using one of the following methods: ? Radiofrequency. This method uses a radiofrequency-alternating electric current to remove the endometrium. ?  Cryotherapy. This method uses extreme cold to freeze the endometrium. ? Heated-free liquid. This method uses a heated saltwater (saline) solution to remove the endometrium. ? Microwave. This method uses high-energy microwaves to heat up the endometrium and remove it. ? Thermal balloon. This method involves inserting a catheter with a balloon tip into the uterus. The balloon tip is filled with heated fluid to remove the endometrium. The procedure may vary among health care providers  and hospitals. What happens after the procedure?  Your blood pressure, heart rate, breathing rate, and blood oxygen level will be monitored until the medicines you were given have worn off.  As tissue healing occurs, you may notice vaginal bleeding for 4-6 weeks after the procedure. You may also experience: ? Cramps. ? Thin, watery vaginal discharge that is light pink or brown in color. ? A need to urinate more frequently than usual. ? Nausea.  Do not drive for 24 hours if you were given a sedative.  Do not have sex or insert anything into your vagina until your health care provider approves. Summary  Endometrial ablation is done to treat the many causes of heavy menstrual bleeding.  The procedure may be done only after medications have been tried to control the bleeding.  Plan to have someone take you home from the hospital or clinic. This information is not intended to replace advice given to you by your health care provider. Make sure you discuss any questions you have with your health care provider. Document Released: 08/21/2004 Document Revised: 10/29/2016 Document Reviewed: 10/29/2016 Elsevier Interactive Patient Education  2017 [place] Anesthesia, Adult General anesthesia is the use of medicines to make a person "go to sleep" (be unconscious) for a medical procedure. General anesthesia is often recommended when a procedure:  Is long.  Requires you to be still or in an unusual position.  Is major and can cause you to lose blood.  Is impossible to do without general anesthesia.  The medicines used for general anesthesia are called general anesthetics. In addition to making you sleep, the medicines:  Prevent pain.  Control your blood pressure.  Relax your muscles.  Tell a health care provider about:  Any allergies you have.  All medicines you are taking, including vitamins, herbs, eye drops, creams, and over-the-counter medicines.  Any problems  you or family members have had with anesthetic medicines.  Types of anesthetics you have had in the past.  Any bleeding disorders you have.  Any surgeries you have had.  Any medical conditions you have.  Any history of heart or lung conditions, such as heart failure, sleep apnea, or chronic obstructive pulmonary disease (COPD).  Whether you are pregnant or may be pregnant.  Whether you use tobacco, alcohol, marijuana, or street drugs.  Any history of Armed forces logistics/support/administrative officer.  Any history of depression or anxiety. What are the risks? Generally, this is a safe procedure. However, problems may occur, including:  Allergic reaction to anesthetics.  Lung and heart problems.  Inhaling food or liquids from your stomach into your lungs (aspiration).  Injury to nerves.  Waking up during your procedure and being unable to move (rare).  Extreme agitation or a state of mental confusion (delirium) when you wake up from the anesthetic.  Air in the bloodstream, which can lead to stroke.  These problems are more likely to develop if you are having a major surgery or if you have an advanced medical condition. You can prevent some of these complications by  answering all of your health care provider's questions thoroughly and by following all pre-procedure instructions. General anesthesia can cause side effects, including:  Nausea or vomiting  A sore throat from the breathing tube.  Feeling cold or shivery.  Feeling tired, washed out, or achy.  Sleepiness or drowsiness.  Confusion or agitation.  What happens before the procedure? Staying hydrated Follow instructions from your health care provider about hydration, which may include:  Up to 2 hours before the procedure - you may continue to drink clear liquids, such as water, clear fruit juice, black coffee, and plain tea.  Eating and drinking restrictions Follow instructions from your health care provider about eating and drinking,  which may include:  8 hours before the procedure - stop eating heavy meals or foods such as meat, fried foods, or fatty foods.  6 hours before the procedure - stop eating light meals or foods, such as toast or cereal.  6 hours before the procedure - stop drinking milk or drinks that contain milk.  2 hours before the procedure - stop drinking clear liquids.  Medicines  Ask your health care provider about: ? Changing or stopping your regular medicines. This is especially important if you are taking diabetes medicines or blood thinners. ? Taking medicines such as aspirin and ibuprofen. These medicines can thin your blood. Do not take these medicines before your procedure if your health care provider instructs you not to. ? Taking new dietary supplements or medicines. Do not take these during the week before your procedure unless your health care provider approves them.  If you are told to take a medicine or to continue taking a medicine on the day of the procedure, take the medicine with sips of water. General instructions   Ask if you will be going home the same day, the following day, or after a longer hospital stay. ? Plan to have someone take you home. ? Plan to have someone stay with you for the first 24 hours after you leave the hospital or clinic.  For 3-6 weeks before the procedure, try not to use any tobacco products, such as cigarettes, chewing tobacco, and e-cigarettes.  You may brush your teeth on the morning of the procedure, but make sure to spit out the toothpaste. What happens during the procedure?  You will be given anesthetics through a mask and through an IV tube in one of your veins.  You may receive medicine to help you relax (sedative).  As soon as you are asleep, a breathing tube may be used to help you breathe.  An anesthesia specialist will stay with you throughout the procedure. He or she will help keep you comfortable and safe by continuing to give you  medicines and adjusting the amount of medicine that you get. He or she will also watch your blood pressure, pulse, and oxygen levels to make sure that the anesthetics do not cause any problems.  If a breathing tube was used to help you breathe, it will be removed before you wake up. The procedure may vary among health care providers and hospitals. What happens after the procedure?  You will wake up, often slowly, after the procedure is complete, usually in a recovery area.  Your blood pressure, heart rate, breathing rate, and blood oxygen level will be monitored until the medicines you were given have worn off.  You may be given medicine to help you calm down if you feel anxious or agitated.  If you will be going  home the same day, your health care provider may check to make sure you can stand, drink, and urinate.  Your health care providers will treat your pain and side effects before you go home.  Do not drive for 24 hours if you received a sedative.  You may: ? Feel nauseous and vomit. ? Have a sore throat. ? Have mental slowness. ? Feel cold or shivery. ? Feel sleepy. ? Feel tired. ? Feel sore or achy, even in parts of your body where you did not have surgery. This information is not intended to replace advice given to you by your health care provider. Make sure you discuss any questions you have with your health care provider. Document Released: 01/19/2008 Document Revised: 03/24/2016 Document Reviewed: 09/26/2015 Elsevier Interactive Patient Education  2018 [place] Anesthesia, Adult, Care After These instructions provide you with information about caring for yourself after your procedure. Your health care provider may also give you more specific instructions. Your treatment has been planned according to current medical practices, but problems sometimes occur. Call your health care provider if you have any problems or questions after your procedure. What can I  expect after the procedure? After the procedure, it is common to have:  Vomiting.  A sore throat.  Mental slowness.  It is common to feel:  Nauseous.  Cold or shivery.  Sleepy.  Tired.  Sore or achy, even in parts of your body where you did not have surgery.  Follow these instructions at home: For at least 24 hours after the procedure:  Do not: ? Participate in activities where you could fall or become injured. ? Drive. ? Use heavy machinery. ? Drink alcohol. ? Take sleeping pills or medicines that cause drowsiness. ? Make important decisions or sign legal documents. ? Take care of children on your own.  Rest. Eating and drinking  If you vomit, drink water, juice, or soup when you can drink without vomiting.  Drink enough fluid to keep your urine clear or pale yellow.  Make sure you have little or no nausea before eating solid foods.  Follow the diet recommended by your health care provider. General instructions  Have a responsible adult stay with you until you are awake and alert.  Return to your normal activities as told by your health care provider. Ask your health care provider what activities are safe for you.  Take over-the-counter and prescription medicines only as told by your health care provider.  If you smoke, do not smoke without supervision.  Keep all follow-up visits as told by your health care provider. This is important. Contact a health care provider if:  You continue to have nausea or vomiting at home, and medicines are not helpful.  You cannot drink fluids or start eating again.  You cannot urinate after 8-12 hours.  You develop a skin rash.  You have fever.  You have increasing redness at the site of your procedure. Get help right away if:  You have difficulty breathing.  You have chest pain.  You have unexpected bleeding.  You feel that you are having a life-threatening or urgent problem. This information is not intended  to replace advice given to you by your health care provider. Make sure you discuss any questions you have with your health care provider. Document Released: 01/18/2001 Document Revised: 03/16/2016 Document Reviewed: 09/26/2015 Elsevier Interactive Patient Education  [employer].

## 2018-01-16 ENCOUNTER — Telehealth: Payer: Self-pay | Admitting: Obstetrics and Gynecology

## 2018-01-16 ENCOUNTER — Other Ambulatory Visit: Payer: Self-pay | Admitting: Obstetrics and Gynecology

## 2018-01-16 MED ORDER — MISOPROSTOL 200 MCG PO TABS: 200.0000 ug | ORAL_TABLET | Freq: Once | ORAL | 0 refills | Status: DC

## 2018-01-16 NOTE — Telephone Encounter (Signed)
Amberlynn informed of the Rx for Cytotec 200 mcg pill escribed to [facility].

## 2018-01-16 NOTE — Progress Notes (Signed)
  rx for cytotec escribed to pharmacy

## 2018-01-17 ENCOUNTER — Other Ambulatory Visit: Payer: Self-pay | Admitting: *Deleted

## 2018-01-17 ENCOUNTER — Telehealth: Payer: Self-pay | Admitting: *Deleted

## 2018-01-17 ENCOUNTER — Other Ambulatory Visit: Payer: Self-pay

## 2018-01-17 ENCOUNTER — Encounter (HOSPITAL_COMMUNITY): Payer: Self-pay

## 2018-01-17 ENCOUNTER — Encounter (HOSPITAL_COMMUNITY)
Admission: RE | Admit: 2018-01-17 | Discharge: 2018-01-17 | Disposition: A | Discharge status: Home / Self Care | Payer: 59 | Source: Ambulatory Visit | Attending: Obstetrics and Gynecology | Admitting: Obstetrics and Gynecology

## 2018-01-17 DIAGNOSIS — K219 Gastro-esophageal reflux disease without esophagitis: Secondary | ICD-10-CM | POA: Diagnosis not present

## 2018-01-17 DIAGNOSIS — N946 Dysmenorrhea, unspecified: Secondary | ICD-10-CM | POA: Diagnosis not present

## 2018-01-17 DIAGNOSIS — I10 Essential (primary) hypertension: Secondary | ICD-10-CM | POA: Diagnosis not present

## 2018-01-17 DIAGNOSIS — N920 Excessive and frequent menstruation with regular cycle: Secondary | ICD-10-CM | POA: Diagnosis not present

## 2018-01-17 DIAGNOSIS — Z87891 Personal history of nicotine dependence: Secondary | ICD-10-CM | POA: Diagnosis not present

## 2018-01-17 DIAGNOSIS — Z79899 Other long term (current) drug therapy: Secondary | ICD-10-CM | POA: Diagnosis not present

## 2018-01-17 DIAGNOSIS — F419 Anxiety disorder, unspecified: Secondary | ICD-10-CM | POA: Diagnosis not present

## 2018-01-17 DIAGNOSIS — N882 Stricture and stenosis of cervix uteri: Secondary | ICD-10-CM | POA: Diagnosis not present

## 2018-01-17 DIAGNOSIS — Z885 Allergy status to narcotic agent status: Secondary | ICD-10-CM | POA: Diagnosis not present

## 2018-01-17 LAB — CBC
HCT: 39.9 % (ref 36.0–46.0)
Hemoglobin: 12.9 g/dL (ref 12.0–15.0)
MCH: 30 pg (ref 26.0–34.0)
MCHC: 32.3 g/dL (ref 30.0–36.0)
MCV: 92.8 fL (ref 78.0–100.0)
PLATELETS: 295 10*3/uL (ref 150–400)
RBC: 4.3 MIL/uL (ref 3.87–5.11)
RDW: 13.9 % (ref 11.5–15.5)
WBC: 7.4 10*3/uL (ref 4.0–10.5)

## 2018-01-17 LAB — COMPREHENSIVE METABOLIC PANEL
ALT: 14 U/L (ref 14–54)
ANION GAP: 12 (ref 5–15)
AST: 20 U/L (ref 15–41)
Albumin: 4.3 g/dL (ref 3.5–5.0)
Alkaline Phosphatase: 40 U/L (ref 38–126)
BUN: 11 mg/dL (ref 6–20)
CO2: 23 mmol/L (ref 22–32)
Calcium: 9.7 mg/dL (ref 8.9–10.3)
Chloride: 103 mmol/L (ref 101–111)
Creatinine, Ser: 0.73 mg/dL (ref 0.44–1.00)
Glucose, Bld: 105 mg/dL — ABNORMAL HIGH (ref 65–99)
POTASSIUM: 4.1 mmol/L (ref 3.5–5.1)
Sodium: 138 mmol/L (ref 135–145)
Total Bilirubin: 0.8 mg/dL (ref 0.3–1.2)
Total Protein: 7.6 g/dL (ref 6.5–8.1)

## 2018-01-17 LAB — HCG, SERUM, QUALITATIVE: PREG SERUM: NEGATIVE

## 2018-01-17 LAB — TYPE AND SCREEN
ABO/RH(D): O POS
ANTIBODY SCREEN: NEGATIVE

## 2018-01-17 MED ORDER — MISOPROSTOL 200 MCG PO TABS: 200.0000 ug | ORAL_TABLET | Freq: Once | ORAL | 0 refills | Status: DC

## 2018-01-18 ENCOUNTER — Ambulatory Visit (HOSPITAL_COMMUNITY)
Admission: RE | Admit: 2018-01-18 | Discharge: 2018-01-18 | Disposition: A | Discharge status: Home / Self Care | Payer: 59 | Source: Ambulatory Visit | Attending: Obstetrics and Gynecology | Admitting: Obstetrics and Gynecology

## 2018-01-18 ENCOUNTER — Encounter (HOSPITAL_COMMUNITY)
Admission: RE | Disposition: A | Discharge status: Home / Self Care | Payer: Self-pay | Source: Ambulatory Visit | Attending: Obstetrics and Gynecology

## 2018-01-18 ENCOUNTER — Ambulatory Visit (HOSPITAL_COMMUNITY): Payer: 59 | Admitting: Certified Registered Nurse Anesthetist

## 2018-01-18 ENCOUNTER — Encounter (HOSPITAL_COMMUNITY): Payer: Self-pay | Admitting: *Deleted

## 2018-01-18 DIAGNOSIS — F419 Anxiety disorder, unspecified: Secondary | ICD-10-CM | POA: Insufficient documentation

## 2018-01-18 DIAGNOSIS — N946 Dysmenorrhea, unspecified: Secondary | ICD-10-CM | POA: Diagnosis not present

## 2018-01-18 DIAGNOSIS — Z87891 Personal history of nicotine dependence: Secondary | ICD-10-CM | POA: Insufficient documentation

## 2018-01-18 DIAGNOSIS — Z79899 Other long term (current) drug therapy: Secondary | ICD-10-CM | POA: Diagnosis not present

## 2018-01-18 DIAGNOSIS — N882 Stricture and stenosis of cervix uteri: Secondary | ICD-10-CM | POA: Diagnosis not present

## 2018-01-18 DIAGNOSIS — I10 Essential (primary) hypertension: Secondary | ICD-10-CM | POA: Diagnosis not present

## 2018-01-18 DIAGNOSIS — Z885 Allergy status to narcotic agent status: Secondary | ICD-10-CM | POA: Diagnosis not present

## 2018-01-18 DIAGNOSIS — N920 Excessive and frequent menstruation with regular cycle: Secondary | ICD-10-CM | POA: Diagnosis not present

## 2018-01-18 DIAGNOSIS — K219 Gastro-esophageal reflux disease without esophagitis: Secondary | ICD-10-CM | POA: Insufficient documentation

## 2018-01-18 HISTORY — PX: ENDOMETRIAL ABLATION: SHX621

## 2018-01-18 HISTORY — PX: HYSTEROSCOPY WITH D & C: SHX1775

## 2018-01-18 SURGERY — ABLATION, ENDOMETRIUM
Anesthesia: General

## 2018-01-18 MED ORDER — HYDROMORPHONE HCL 1 MG/ML IJ SOLN
INTRAMUSCULAR | Status: AC
  Filled 2018-01-18: qty 0.5

## 2018-01-18 MED ORDER — MIDAZOLAM HCL 2 MG/2ML IJ SOLN
1.0000 mg | INTRAMUSCULAR | Status: AC
  Administered 2018-01-18: 2 mg via INTRAVENOUS

## 2018-01-18 MED ORDER — PROPOFOL 10 MG/ML IV BOLUS
INTRAVENOUS | Status: DC | PRN
  Administered 2018-01-18: 180 mg via INTRAVENOUS

## 2018-01-18 MED ORDER — LIDOCAINE HCL (CARDIAC) 20 MG/ML IV SOLN
INTRAVENOUS | Status: DC | PRN
  Administered 2018-01-18: 40 mg via INTRAVENOUS

## 2018-01-18 MED ORDER — FENTANYL CITRATE (PF) 100 MCG/2ML IJ SOLN
INTRAMUSCULAR | Status: DC | PRN
  Administered 2018-01-18: 50 ug via INTRAVENOUS
  Administered 2018-01-18: 25 ug via INTRAVENOUS
  Administered 2018-01-18: 50 ug via INTRAVENOUS
  Administered 2018-01-18: 25 ug via INTRAVENOUS

## 2018-01-18 MED ORDER — BUPIVACAINE-EPINEPHRINE (PF) 0.5% -1:200000 IJ SOLN
INTRAMUSCULAR | Status: AC
  Filled 2018-01-18: qty 30

## 2018-01-18 MED ORDER — ONDANSETRON HCL 4 MG/2ML IJ SOLN
INTRAMUSCULAR | Status: AC
  Filled 2018-01-18: qty 2

## 2018-01-18 MED ORDER — MIDAZOLAM HCL 2 MG/2ML IJ SOLN
INTRAMUSCULAR | Status: AC
Start: 2018-01-18 — End: 2018-01-18
  Filled 2018-01-18: qty 2

## 2018-01-18 MED ORDER — DEXAMETHASONE SODIUM PHOSPHATE 4 MG/ML IJ SOLN
INTRAMUSCULAR | Status: AC
  Filled 2018-01-18: qty 1

## 2018-01-18 MED ORDER — HYDROMORPHONE HCL 1 MG/ML IJ SOLN
0.2500 mg | INTRAMUSCULAR | Status: DC | PRN
  Administered 2018-01-18 (×4): 0.5 mg via INTRAVENOUS
  Filled 2018-01-18 (×3): qty 0.5

## 2018-01-18 MED ORDER — BUPIVACAINE-EPINEPHRINE 0.5% -1:200000 IJ SOLN
INTRAMUSCULAR | Status: DC | PRN
  Administered 2018-01-18: 20 mL

## 2018-01-18 MED ORDER — LACTATED RINGERS IV SOLN
INTRAVENOUS | Status: DC
  Administered 2018-01-18: 1000 mL via INTRAVENOUS

## 2018-01-18 MED ORDER — FENTANYL CITRATE (PF) 100 MCG/2ML IJ SOLN
INTRAMUSCULAR | Status: AC
  Filled 2018-01-18: qty 2

## 2018-01-18 MED ORDER — PHENYLEPHRINE 40 MCG/ML (10ML) SYRINGE FOR IV PUSH (FOR BLOOD PRESSURE SUPPORT)
PREFILLED_SYRINGE | INTRAVENOUS | Status: AC
  Filled 2018-01-18: qty 10

## 2018-01-18 MED ORDER — SODIUM CHLORIDE 0.9 % IR SOLN
Status: DC | PRN
  Administered 2018-01-18: 1000 mL
  Administered 2018-01-18: 3000 mL

## 2018-01-18 MED ORDER — KETOROLAC TROMETHAMINE 10 MG PO TABS: 10.0000 mg | ORAL_TABLET | Freq: Four times a day (QID) | ORAL | 0 refills | Status: DC | PRN

## 2018-01-18 MED ORDER — DEXAMETHASONE SODIUM PHOSPHATE 4 MG/ML IJ SOLN
4.0000 mg | Freq: Once | INTRAMUSCULAR | Status: AC
  Administered 2018-01-18: 4 mg via INTRAVENOUS

## 2018-01-18 MED ORDER — ONDANSETRON HCL 4 MG/2ML IJ SOLN
4.0000 mg | Freq: Once | INTRAMUSCULAR | Status: AC
  Administered 2018-01-18: 4 mg via INTRAVENOUS

## 2018-01-18 MED ORDER — LIDOCAINE HCL (PF) 1 % IJ SOLN
INTRAMUSCULAR | Status: AC
  Filled 2018-01-18: qty 5

## 2018-01-18 MED ORDER — PROPOFOL 10 MG/ML IV BOLUS
INTRAVENOUS | Status: AC
  Filled 2018-01-18: qty 20

## 2018-01-18 SURGICAL SUPPLY — 27 items
BAG HAMPER (MISCELLANEOUS) ×2 IMPLANT
CLOTH BEACON ORANGE TIMEOUT ST (SAFETY) ×2 IMPLANT
COVER LIGHT HANDLE STERIS (MISCELLANEOUS) ×4 IMPLANT
DECANTER SPIKE VIAL GLASS SM (MISCELLANEOUS) ×2 IMPLANT
GAUZE SPONGE 4X4 12PLY STRL (GAUZE/BANDAGES/DRESSINGS) ×2 IMPLANT
GLOVE BIOGEL PI IND STRL 6.5 (GLOVE) IMPLANT
GLOVE BIOGEL PI IND STRL 7.0 (GLOVE) ×1 IMPLANT
GLOVE BIOGEL PI IND STRL 9 (GLOVE) ×1 IMPLANT
GLOVE BIOGEL PI INDICATOR 6.5 (GLOVE) ×1
GLOVE BIOGEL PI INDICATOR 7.0 (GLOVE) ×1
GLOVE BIOGEL PI INDICATOR 9 (GLOVE) ×1
GLOVE ECLIPSE 9.0 STRL (GLOVE) ×2 IMPLANT
GLOVE SURG SS PI 6.5 STRL IVOR (GLOVE) ×1 IMPLANT
GOWN SPEC L3 XXLG W/TWL (GOWN DISPOSABLE) ×2 IMPLANT
GOWN STRL REUS W/TWL LRG LVL3 (GOWN DISPOSABLE) ×2 IMPLANT
HANDPIECE ABLA MINERVA ENDO (MISCELLANEOUS) ×1 IMPLANT
INST SET HYSTEROSCOPY (KITS) ×2 IMPLANT
IV NS IRRIG 3000ML ARTHROMATIC (IV SOLUTION) ×1 IMPLANT
KIT TURNOVER KIT A (KITS) ×2 IMPLANT
MANIFOLD NEPTUNE II (INSTRUMENTS) ×2 IMPLANT
NS IRRIG 1000ML POUR BTL (IV SOLUTION) ×2 IMPLANT
PACK PERI GYN (CUSTOM PROCEDURE TRAY) ×2 IMPLANT
PAD ARMBOARD 7.5X6 YLW CONV (MISCELLANEOUS) ×2 IMPLANT
PAD TELFA 3X4 1S STER (GAUZE/BANDAGES/DRESSINGS) ×2 IMPLANT
SET BASIN LINEN APH (SET/KITS/TRAYS/PACK) ×2 IMPLANT
SET CYSTO W/LG BORE CLAMP LF (SET/KITS/TRAYS/PACK) ×2 IMPLANT
SYR CONTROL 10ML LL (SYRINGE) ×2 IMPLANT

## 2018-01-18 NOTE — Transfer of Care (Signed)
Immediate Anesthesia Transfer of Care Note  Patient: [name]  Procedure(s) Performed: ENDOMETRIAL ABLATION WITH MINERVA DILATATION AND CURETTAGE /HYSTEROSCOPY  Patient Location: PACU  Anesthesia Type:General  Level of Consciousness: awake, alert , oriented and patient cooperative  Airway & Oxygen Therapy: Patient Spontanous Breathing and Patient connected to face mask oxygen  Post-op Assessment: Report given to RN and Post -op Vital signs reviewed and stable  Post vital signs: Reviewed and stable  Last Vitals:  Vitals Value Taken Time  BP    Temp    Pulse 102 01/18/2018 11:29 AM  Resp 16 01/18/2018 11:29 AM  SpO2 100 % 01/18/2018 11:29 AM  Vitals shown include unvalidated device data.  Last Pain:  Vitals:   01/18/18 0956  TempSrc: Oral  PainSc: 0-No pain      Patients Stated Pain Goal: 7 (01/75/10 2585)  Complications: No apparent anesthesia complications

## 2018-01-18 NOTE — Brief Op Note (Signed)
01/18/2018  11:59 AM  PATIENT:  [name]  37 y.o. female  PRE-OPERATIVE DIAGNOSIS:  dysmenorrhea menorrhagia cervical stenosis  POST-OPERATIVE DIAGNOSIS:  dysmenorrhea menorrhagia cervical stenosis  PROCEDURE:  Procedure(s): ENDOMETRIAL ABLATION WITH MINERVA CERVICAL DILATATION /HYSTEROSCOPY  SURGEON:  Surgeon(s) and Role:    Jonnie Kind, MD - Primary  PHYSICIAN ASSISTANT:   ASSISTANTS: none   ANESTHESIA:   general and paracervical block  EBL minimal  BLOOD ADMINISTERED:none  DRAINS: none   LOCAL MEDICATIONS USED:  MARCAINE    and Amount: 20 ml  SPECIMEN:  Source of Specimen:  Endometrial curettings  DISPOSITION OF SPECIMEN:  PATHOLOGY  COUNTS:  YES  TOURNIQUET:  * No tourniquets in log *  DICTATION: .Dragon Dictation  PLAN OF CARE: Discharge to home after PACU  PATIENT DISPOSITION:  PACU - hemodynamically stable.   Delay start of Pharmacological VTE agent (>24hrs) due to surgical blood loss or risk of bleeding: not applicable Details of procedure: Patient was taken the operating room prepped and draped for vaginal procedure.  Timeout was conducted and procedure confirmed by surgical team.  Speculum was inserted, cervix grasped sounded in the very anteflexed orientation of the uterus to 7-1/2 cm, dilated to 27 [place] allowing introduction with difficulty of the 30 degree operative hysteroscope which showed both tubal ostia and a smooth endometrial cavity.  There is no tissue to brain.  No suspicion of perforation.  The Minerva endometrial ablation device was then activated placed and the 2-minute ablation sequence completed with safety checks throughout.  Paracervical block can be employed at start of the case.  Patient tolerated procedure well and went to recovery room in stable condition will receive Toradol and Vicodin for discharge home

## 2018-01-18 NOTE — H&P (Signed)
[name] is an 37 y.o. female. She is a [id number] s/p cesarean section x 2, with a history of salpingectomy, and discontinued effort at endometrial ablation due to suspected uterine perforation with the dilator during preparation for the procedure at the time of tubal ligation. The patient was being considered for abdominal supracervical hysterectomy, but this was not approved by insurance review. The patient was given multiple options, of attempts to treat her debilitating dysmenorrhea , including reattempting endometrial ablation, with cytotec preparation of the cervix to facilitate cervical dilation, and this is the method she prefers. The patient is fully aware of the risks of the procedure, potential for surgical failure or unanticipated complications. Plans are to use Minerva, which has smaller dilation and good safety processes.  Pertinent Gynecological History: Menses: flow is moderate, usually lasting 5-8 to 8 days and with severe dysmenorrhea Bleeding: Heavy periods with pain Contraception: tubal ligation DES exposure: unknown Blood transfusions: none Sexually transmitted diseases: no past history Previous GYN Procedures: cesarean x 2, tubal ligation  Last mammogram:  Date:  Last pap: normal Date: November 18, 2017 OB History: G5, P0032   Menstrual History: Menarche age: Patient's last menstrual period was 01/09/2018 (exact date).    Past Medical History:  Diagnosis Date  . Acid reflux   . Anxiety   . Arthritis   . Environmental allergies   . Hypertension     Past Surgical History:  Procedure Laterality Date  . arm surgery Left    plate in arm  . BILATERAL SALPINGECTOMY    . BONE BIOPSY Right    thumb  . BRAIN SURGERY     evacuation of hematoma  . CESAREAN SECTION     x2  . CHOLECYSTECTOMY    . ECTOPIC PREGNANCY SURGERY     x2  . ESOPHAGOGASTRODUODENOSCOPY (EGD) WITH PROPOFOL N/A 01/30/2014   Procedure: ESOPHAGOGASTRODUODENOSCOPY (EGD) WITH PROPOFOL;  Surgeon:  Danie Binder, MD;  Location: [facility];  Service: Endoscopy;  Laterality: N/A;  . HYSTEROSCOPY W/D&C N/A 08/13/2015   Procedure: DILATATION AND CURETTAGE /HYSTEROSCOPY;  Surgeon: Jonnie Kind, MD;  Location: [facility];  Service: Gynecology;  Laterality: N/A;  . LEG SURGERY Left    femur rodding and removal of part of left femur  . otif ankle Right     Family History  Problem Relation Age of Onset  . Mental illness Mother   . Diabetes Mother   . Obesity Mother   . Obesity Sister   . Diabetes Son        boarderline  . Arthritis Unknown   . Cancer Unknown   . Diabetes Unknown   . Cancer Maternal Grandfather        GASTRIC    Social History:  reports that she quit smoking about 6 years ago. Her smoking use included cigarettes. She has a 20.00 pack-year smoking history. She has never used smokeless tobacco. She reports that she does not drink alcohol or use drugs.  Allergies:  Allergies  Allergen Reactions  . Demerol Nausea Only  . Orange Fruit [Citrus]     Fever blisters    No medications prior to admission.    ROS  Last menstrual period 01/09/2018. Physical Exam  Constitutional: She is oriented to person, place, and time. She appears well-developed and well-nourished.  HENT:  Head: Normocephalic.  Eyes: Pupils are equal, round, and reactive to light.  Neck: Normal range of motion.  GI: Soft.  Well-healed lower abdominal cesarean section scar with some laxity  in the tissues above the incision  Genitourinary: Vagina normal.  Genitourinary Comments: Normal-appearing cervix, mobile uterus  Musculoskeletal: Normal range of motion.  Neurological: She is alert and oriented to person, place, and time.  Skin: Skin is warm and dry.  Psychiatric: She has a normal mood and affect. Her behavior is normal. Judgment and thought content normal.    Results for orders placed or performed during the hospital encounter of 01/17/18 (from the past 24 hour(s))  CBC     Status: None    Collection Time: 01/17/18  8:27 AM  Result Value Ref Range   WBC 7.4 4.0 - 10.5 K/uL   RBC 4.30 3.87 - 5.11 MIL/uL   Hemoglobin 12.9 12.0 - 15.0 g/dL   HCT 39.9 36.0 - 46.0 %   MCV 92.8 78.0 - 100.0 fL   MCH 30.0 26.0 - 34.0 pg   MCHC 32.3 30.0 - 36.0 g/dL   RDW 13.9 11.5 - 15.5 %   Platelets 295 150 - 400 K/uL  Comprehensive metabolic panel     Status: Abnormal   Collection Time: 01/17/18  8:27 AM  Result Value Ref Range   Sodium 138 135 - 145 mmol/L   Potassium 4.1 3.5 - 5.1 mmol/L   Chloride 103 101 - 111 mmol/L   CO2 23 22 - 32 mmol/L   Glucose, Bld 105 (H) 65 - 99 mg/dL   BUN 11 6 - 20 mg/dL   Creatinine, Ser 0.73 0.44 - 1.00 mg/dL   Calcium 9.7 8.9 - 10.3 mg/dL   Total Protein 7.6 6.5 - 8.1 g/dL   Albumin 4.3 3.5 - 5.0 g/dL   AST 20 15 - 41 U/L   ALT 14 14 - 54 U/L   Alkaline Phosphatase 40 38 - 126 U/L   Total Bilirubin 0.8 0.3 - 1.2 mg/dL   GFR calc non Af Amer >60 >60 mL/min   GFR calc Af Amer >60 >60 mL/min   Anion gap 12 5 - 15  hCG, serum, qualitative     Status: None   Collection Time: 01/17/18  8:27 AM  Result Value Ref Range   Preg, Serum NEGATIVE NEGATIVE  Type and screen     Status: None   Collection Time: 01/17/18  8:27 AM  Result Value Ref Range   ABO/RH(D) O POS    Antibody Screen NEG    Sample Expiration 01/31/2018    Extend sample reason      NO TRANSFUSIONS OR PREGNANCY IN THE PAST 3 MONTHS Performed at [facility], [address], [place], [place] [address]     No results found.  Assessment/Plan: Recurrent dysmenorrhea and heavy menses Status post cesarean section x2 Status post failed endometrial ablation due to uterine perforation during cervical dilation Status post cervical ripening this morning with Cytotec 200 mcg p.o. this morning at 6 AM Plan: Hysteroscopy D&C with endometrial ablation using Minerva to be performed this morning Risks benefits alternatives reviewed in detail during preoperative counseling  Jonnie Kind 01/18/2018, 6:27 AM

## 2018-01-18 NOTE — Anesthesia Postprocedure Evaluation (Signed)
Anesthesia Post Note  Patient: [name]  Procedure(s) Performed: ENDOMETRIAL ABLATION WITH MINERVA DILATATION AND CURETTAGE /HYSTEROSCOPY  Patient location during evaluation: PACU Anesthesia Type: General Level of consciousness: awake, awake and alert, oriented and patient cooperative Pain management: pain level controlled Vital Signs Assessment: post-procedure vital signs reviewed and stable Respiratory status: spontaneous breathing, patient connected to face mask oxygen, nonlabored ventilation and respiratory function stable Cardiovascular status: stable Postop Assessment: no apparent nausea or vomiting Anesthetic complications: no     Last Vitals:  Vitals:   01/18/18 1030 01/18/18 1130  BP: 104/65 (P) 117/62  Pulse:  (P) 98  Resp: (!) 24   Temp:  (P) 36.5 C  SpO2: 97%     Last Pain:  Vitals:   01/18/18 0956  TempSrc: Oral  PainSc: 0-No pain                 Erskin Zinda L

## 2018-01-18 NOTE — Op Note (Signed)
Please see the brief operative note for surgical details 

## 2018-01-18 NOTE — Anesthesia Preprocedure Evaluation (Signed)
Anesthesia Evaluation  Patient identified by MRN, date of birth, ID band Patient awake    Reviewed: Allergy & Precautions, NPO status , Patient's Chart, lab work & pertinent test results  Airway Mallampati: II  TM Distance: >3 FB     Dental  (+) Teeth Intact   Pulmonary former smoker,    breath sounds clear to auscultation       Cardiovascular hypertension, Pt. on medications  Rhythm:Regular Rate:Normal     Neuro/Psych PSYCHIATRIC DISORDERS Anxiety    GI/Hepatic Neg liver ROS, GERD  Controlled and Medicated,  Endo/Other  negative endocrine ROS  Renal/GU negative Renal ROS     Musculoskeletal  (+) Arthritis ,   Abdominal   Peds  Hematology negative hematology ROS (+)   Anesthesia Other Findings Raspy voice - chronic  Reproductive/Obstetrics                             Anesthesia Physical Anesthesia Plan  ASA: II  Anesthesia Plan: General   Post-op Pain Management:    Induction: Intravenous  PONV Risk Score and Plan:   Airway Management Planned: LMA  Additional Equipment:   Intra-op Plan:   Post-operative Plan: Extubation in OR  Informed Consent: I have reviewed the patients History and Physical, chart, labs and discussed the procedure including the risks, benefits and alternatives for the proposed anesthesia with the patient or authorized representative who has indicated his/her understanding and acceptance.     Plan Discussed with:   Anesthesia Plan Comments:         Anesthesia Quick Evaluation

## 2018-01-18 NOTE — Discharge Instructions (Signed)
Endometrial Ablation Endometrial ablation is a procedure that destroys the thin inner layer of the lining of the uterus (endometrium). This procedure may be done:  To stop heavy periods.  To stop bleeding that is causing anemia.  To control irregular bleeding.  To treat bleeding caused by small tumors (fibroids) in the endometrium.  This procedure is often an alternative to major surgery, such as removal of the uterus and cervix (hysterectomy). As a result of this procedure:  You may not be able to have children. However, if you are premenopausal (you have not gone through menopause): ? You may still have a small chance of getting pregnant. ? You will need to use a reliable method of birth control after the procedure to prevent pregnancy.  You may stop having a menstrual period, or you may have only a small amount of bleeding during your period. Menstruation may return several years after the procedure.  Tell a health care provider about:  Any allergies you have.  All medicines you are taking, including vitamins, herbs, eye drops, creams, and over-the-counter medicines.  Any problems you or family members have had with the use of anesthetic medicines.  Any blood disorders you have.  Any surgeries you have had.  Any medical conditions you have. What are the risks? Generally, this is a safe procedure. However, problems may occur, including:  A hole (perforation) in the uterus or bowel.  Infection of the uterus, bladder, or vagina.  Bleeding.  Damage to other structures or organs.  An air bubble in the lung (air embolus).  Problems with pregnancy after the procedure.  Failure of the procedure.  Decreased ability to diagnose cancer in the endometrium.  What happens before the procedure?  You will have tests of your endometrium to make sure there are no pre-cancerous cells or cancer cells present.  You may have an ultrasound of the uterus.  You may be given  medicines to thin the endometrium.  Ask your health care provider about: ? Changing or stopping your regular medicines. This is especially important if you take diabetes medicines or blood thinners. ? Taking medicines such as aspirin and ibuprofen. These medicines can thin your blood. Do not take these medicines before your procedure if your doctor tells you not to.  Plan to have someone take you home from the hospital or clinic. What happens during the procedure?  You will lie on an exam table with your feet and legs supported as in a pelvic exam.  To lower your risk of infection: ? Your health care team will wash or sanitize their hands and put on germ-free (sterile) gloves. ? Your genital area will be washed with soap.  An IV tube will be inserted into one of your veins.  You will be given a medicine to help you relax (sedative).  A surgical instrument with a light and camera (resectoscope) will be inserted into your vagina and moved into your uterus. This allows your surgeon to see inside your uterus.  Endometrial tissue will be removed using one of the following methods: ? Radiofrequency. This method uses a radiofrequency-alternating electric current to remove the endometrium. ? Cryotherapy. This method uses extreme cold to freeze the endometrium. ? Heated-free liquid. This method uses a heated saltwater (saline) solution to remove the endometrium. ? Microwave. This method uses high-energy microwaves to heat up the endometrium and remove it. ? Thermal balloon. This method involves inserting a catheter with a balloon tip into the uterus. The balloon tip is   filled with heated fluid to remove the endometrium. The procedure may vary among health care providers and hospitals. What happens after the procedure?  Your blood pressure, heart rate, breathing rate, and blood oxygen level will be monitored until the medicines you were given have worn off.  As tissue healing occurs, you may  notice vaginal bleeding for 4-6 weeks after the procedure. You may also experience: ? Cramps. ? Thin, watery vaginal discharge that is light pink or brown in color. ? A need to urinate more frequently than usual. ? Nausea.  Do not drive for 24 hours if you were given a sedative.  Do not have sex or insert anything into your vagina until your health care provider approves. Summary  Endometrial ablation is done to treat the many causes of heavy menstrual bleeding.  The procedure may be done only after medications have been tried to control the bleeding.  Plan to have someone take you home from the hospital or clinic. This information is not intended to replace advice given to you by your health care provider. Make sure you discuss any questions you have with your health care provider. Document Released: 08/21/2004 Document Revised: 10/29/2016 Document Reviewed: 10/29/2016 Elsevier Interactive Patient Education  2017 [employer]  

## 2018-01-18 NOTE — Anesthesia Procedure Notes (Signed)
Procedure Name: LMA Insertion Date/Time: 01/18/2018 10:46 AM Performed by: Raenette Rover, CRNA Pre-anesthesia Checklist: Patient identified, Emergency Drugs available, Suction available and Patient being monitored Patient Re-evaluated:Patient Re-evaluated prior to induction Oxygen Delivery Method: Circle system utilized Preoxygenation: Pre-oxygenation with 100% oxygen Induction Type: IV induction LMA: LMA inserted LMA Size: 4.0 Number of attempts: 1 Placement Confirmation: positive ETCO2,  breath sounds checked- equal and bilateral and CO2 detector Tube secured with: Tape Dental Injury: Teeth and Oropharynx as per pre-operative assessment

## 2018-01-19 ENCOUNTER — Encounter (HOSPITAL_COMMUNITY): Payer: Self-pay | Admitting: Obstetrics and Gynecology

## 2018-01-20 ENCOUNTER — Telehealth: Payer: Self-pay | Admitting: Obstetrics and Gynecology

## 2018-01-20 NOTE — Telephone Encounter (Signed)
Called patient to check on her post endometrial ablation.  Unable to reach patient message left advising patient to call for any problems

## 2018-01-26 ENCOUNTER — Encounter: Payer: Self-pay | Admitting: Obstetrics and Gynecology

## 2018-01-26 ENCOUNTER — Ambulatory Visit (INDEPENDENT_AMBULATORY_CARE_PROVIDER_SITE_OTHER): Payer: 59 | Admitting: Obstetrics and Gynecology

## 2018-01-26 VITALS — BP 130/74 | HR 70 | Ht 63.0 in | Wt 183.0 lb

## 2018-01-26 DIAGNOSIS — N920 Excessive and frequent menstruation with regular cycle: Secondary | ICD-10-CM

## 2018-01-26 DIAGNOSIS — Z09 Encounter for follow-up examination after completed treatment for conditions other than malignant neoplasm: Secondary | ICD-10-CM

## 2018-01-26 DIAGNOSIS — Z9889 Other specified postprocedural states: Secondary | ICD-10-CM

## 2018-01-26 DIAGNOSIS — N882 Stricture and stenosis of cervix uteri: Secondary | ICD-10-CM

## 2018-01-26 NOTE — Progress Notes (Signed)
Patient ID: [name], female   DOB: Jun 04, 1981, 37 y.o.   MRN: [id number]    Subjective:  [name] is a 37 y.o. female now 1 weeks status post ENDOMETRIAL ABLATION WITH MINERVA and CERVICAL DILATATION /HYSTEROSCOPY.    Review of Systems Negative except some pain on Friday 01/21/2018 and Saturday 01/22/2018 that has resolved. She also endorses a mild clear/ watery discharge.   Diet:   normal   Bowel movements : normal.  The patient is not having any pain.  Objective:  BP 130/74 (BP Location: Left Arm, Patient Position: Sitting, Cuff Size: Normal)   Pulse 70   Ht 5\' 3"  (1.6 m)   Wt 183 lb (83 kg)   LMP 01/09/2018 (Exact Date)   BMI 32.42 kg/m  General:Well developed, well nourished.  No acute distress. Abdomen: Bowel sounds normal, soft, non-tender. Pelvic Exam:    External Genitalia:  Normal.    Vagina: Normal    Cervix: small area of erosion on interior lip of cervix, watery light pink discharge    Uterus: Normal, non tender    Adnexa/Bimanual: Normal  Incision(s): ROS: Internal exam Healing well, no drainage, no erythema, no hernia, no swelling, no dehiscence,     Assessment:  Post-Op 1 weeks s/p ENDOMETRIAL ABLATION WITH MINERVA and CERVICAL DILATATION /HYSTEROSCOPY   Doing well postoperatively.   Plan:  1.Wound care discussed   2. . current medications. 3. Activity restrictions: intercourse for the next two weeks 4. return to work: now. 5. Follow up PRN.  By signing my name below, I, Margit Banda, attest that this documentation has been prepared under the direction and in the presence of Jonnie Kind, MD. Electronically Signed: Margit Banda, Medical Scribe. 01/26/18. 9:36 AM.  I personally performed the services described in this documentation, which was SCRIBED in my presence. The recorded information has been reviewed and considered accurate. It has been edited as necessary during review. Jonnie Kind, MD

## 2018-04-07 DIAGNOSIS — Z1389 Encounter for screening for other disorder: Secondary | ICD-10-CM | POA: Diagnosis not present

## 2018-04-07 DIAGNOSIS — E6609 Other obesity due to excess calories: Secondary | ICD-10-CM | POA: Diagnosis not present

## 2018-04-07 DIAGNOSIS — G4700 Insomnia, unspecified: Secondary | ICD-10-CM | POA: Diagnosis not present

## 2018-04-07 DIAGNOSIS — F419 Anxiety disorder, unspecified: Secondary | ICD-10-CM | POA: Diagnosis not present

## 2018-04-07 DIAGNOSIS — Z6831 Body mass index (BMI) 31.0-31.9, adult: Secondary | ICD-10-CM | POA: Diagnosis not present

## 2018-04-07 DIAGNOSIS — K219 Gastro-esophageal reflux disease without esophagitis: Secondary | ICD-10-CM | POA: Diagnosis not present

## 2018-04-07 DIAGNOSIS — G894 Chronic pain syndrome: Secondary | ICD-10-CM | POA: Diagnosis not present

## 2018-06-28 DIAGNOSIS — Z0001 Encounter for general adult medical examination with abnormal findings: Secondary | ICD-10-CM | POA: Diagnosis not present

## 2018-06-28 DIAGNOSIS — Z1389 Encounter for screening for other disorder: Secondary | ICD-10-CM | POA: Diagnosis not present

## 2018-06-28 DIAGNOSIS — M2550 Pain in unspecified joint: Secondary | ICD-10-CM | POA: Diagnosis not present

## 2018-06-28 DIAGNOSIS — K219 Gastro-esophageal reflux disease without esophagitis: Secondary | ICD-10-CM | POA: Diagnosis not present

## 2018-06-28 DIAGNOSIS — E6609 Other obesity due to excess calories: Secondary | ICD-10-CM | POA: Diagnosis not present

## 2018-06-28 DIAGNOSIS — Z6830 Body mass index (BMI) 30.0-30.9, adult: Secondary | ICD-10-CM | POA: Diagnosis not present

## 2018-06-28 DIAGNOSIS — M1991 Primary osteoarthritis, unspecified site: Secondary | ICD-10-CM | POA: Diagnosis not present

## 2018-06-28 DIAGNOSIS — I10 Essential (primary) hypertension: Secondary | ICD-10-CM | POA: Diagnosis not present

## 2018-06-28 DIAGNOSIS — F419 Anxiety disorder, unspecified: Secondary | ICD-10-CM | POA: Diagnosis not present

## 2018-06-28 DIAGNOSIS — G894 Chronic pain syndrome: Secondary | ICD-10-CM | POA: Diagnosis not present

## 2018-09-02 DIAGNOSIS — Z1389 Encounter for screening for other disorder: Secondary | ICD-10-CM | POA: Diagnosis not present

## 2018-09-02 DIAGNOSIS — I10 Essential (primary) hypertension: Secondary | ICD-10-CM | POA: Diagnosis not present

## 2018-09-02 DIAGNOSIS — K219 Gastro-esophageal reflux disease without esophagitis: Secondary | ICD-10-CM | POA: Diagnosis not present

## 2018-09-02 DIAGNOSIS — F419 Anxiety disorder, unspecified: Secondary | ICD-10-CM | POA: Diagnosis not present

## 2018-09-02 DIAGNOSIS — G894 Chronic pain syndrome: Secondary | ICD-10-CM | POA: Diagnosis not present

## 2018-10-04 DIAGNOSIS — H52223 Regular astigmatism, bilateral: Secondary | ICD-10-CM | POA: Diagnosis not present

## 2018-10-04 DIAGNOSIS — H5213 Myopia, bilateral: Secondary | ICD-10-CM | POA: Diagnosis not present

## 2018-12-28 NOTE — Telephone Encounter (Signed)
Note sent to nurse. 

## 2020-02-27 ENCOUNTER — Telehealth: Payer: Self-pay | Admitting: Obstetrics and Gynecology

## 2020-02-27 NOTE — Telephone Encounter (Signed)

## 2020-02-28 ENCOUNTER — Other Ambulatory Visit: Payer: Self-pay

## 2020-02-28 ENCOUNTER — Other Ambulatory Visit (HOSPITAL_COMMUNITY)
Admission: RE | Admit: 2020-02-28 | Discharge: 2020-02-28 | Disposition: A | Discharge status: Home / Self Care | Payer: No Typology Code available for payment source | Source: Ambulatory Visit | Attending: Obstetrics and Gynecology | Admitting: Obstetrics and Gynecology

## 2020-02-28 ENCOUNTER — Ambulatory Visit (INDEPENDENT_AMBULATORY_CARE_PROVIDER_SITE_OTHER): Payer: No Typology Code available for payment source | Admitting: Obstetrics and Gynecology

## 2020-02-28 ENCOUNTER — Encounter: Payer: Self-pay | Admitting: Obstetrics and Gynecology

## 2020-02-28 VITALS — BP 119/72 | HR 78 | Ht 63.5 in | Wt 139.6 lb

## 2020-02-28 DIAGNOSIS — Z01419 Encounter for gynecological examination (general) (routine) without abnormal findings: Secondary | ICD-10-CM | POA: Diagnosis not present

## 2020-02-28 NOTE — Progress Notes (Signed)
PATIENT ID: [name], female     DOB: 1981-08-07, 40 y.o.     MRN: [id number]    [facility] Visit  02/28/20           Patient name: [name] MRN [id number]  Date of birth: 03-28-1981  CC & HPI:  [name] is a 39 y.o. female presenting today for pap smear.   She notes that when her period is supposed to come on, she has some cramping, followed by a day of minimal brown discharge, and a day of minimal pink discharge. She only has to wear a small panty liner.    ROS:  Review of Systems  Constitutional: Negative for diaphoresis, fever, malaise/fatigue and weight loss.  HENT: Negative for congestion and sore throat.   Eyes: Negative for blurred vision and double vision.  Respiratory: Negative for cough and shortness of breath.   Cardiovascular: Negative for chest pain, palpitations and leg swelling.  Gastrointestinal: Negative for constipation, diarrhea, nausea and vomiting.  Genitourinary: Negative for frequency and urgency.  Musculoskeletal: Negative for back pain, falls and myalgias.  Skin: Negative for rash.  Neurological: Negative for dizziness, weakness and headaches.  Psychiatric/Behavioral: Negative for depression. The patient is not nervous/anxious.      Pertinent History Reviewed:  Reviewed: Significant for uterine perforation  Medical         Past Medical History:  Diagnosis Date  . Acid reflux   . Anxiety   . Arthritis   . Environmental allergies   . Hypertension                               Surgical Hx:    Past Surgical History:  Procedure Laterality Date  . arm surgery Left    plate in arm  . BILATERAL SALPINGECTOMY    . BONE BIOPSY Right    thumb  . BRAIN SURGERY     evacuation of hematoma  . CESAREAN SECTION     x2  . CHOLECYSTECTOMY    . ECTOPIC PREGNANCY SURGERY     x2  . ENDOMETRIAL ABLATION  01/18/2018   Procedure: ENDOMETRIAL ABLATION WITH MINERVA;  Surgeon: Jonnie Kind, MD;  Location: [facility];  Service:  Gynecology;;  . ESOPHAGOGASTRODUODENOSCOPY (EGD) WITH PROPOFOL N/A 01/30/2014   Procedure: ESOPHAGOGASTRODUODENOSCOPY (EGD) WITH PROPOFOL;  Surgeon: Danie Binder, MD;  Location: [facility];  Service: Endoscopy;  Laterality: N/A;  . HYSTEROSCOPY WITH D & C N/A 08/13/2015   Procedure: DILATATION AND CURETTAGE /HYSTEROSCOPY;  Surgeon: Jonnie Kind, MD;  Location: [facility];  Service: Gynecology;  Laterality: N/A;  . HYSTEROSCOPY WITH D & C  01/18/2018   Procedure: CERVICAL DILATATION /HYSTEROSCOPY;  Surgeon: Jonnie Kind, MD;  Location: [facility];  Service: Gynecology;;  . LEG SURGERY Left    femur rodding and removal of part of left femur  . otif ankle Right    Medications: Reviewed & Updated - see associated section                       Current Outpatient Medications:  .  amLODipine (NORVASC) 10 MG tablet, Take 10 mg by mouth daily., Disp: , Rfl:  .  busPIRone (BUSPAR) 10 MG tablet, Take 10 mg by mouth 2 (two) times daily., Disp: , Rfl:  .  gabapentin (NEURONTIN) 300 MG capsule, Take 300 mg by mouth 3 (three) times daily., Disp: ,  Rfl:  .  HYDROcodone-acetaminophen (NORCO) 10-325 MG per tablet, Take 1 tablet by mouth every 6 (six) hours as needed for moderate pain., Disp: , Rfl:  .  Multiple Vitamins-Minerals (MULTIVITAMIN ADULT) CHEW, Chew 2 each by mouth daily., Disp: , Rfl:  .  Omega-3 Fatty Acids (FISH OIL ADULT GUMMIES PO), Take by mouth., Disp: , Rfl:  .  pantoprazole (PROTONIX) 40 MG tablet, Take 1 tablet (40 mg total) by mouth 2 (two) times daily before a meal., Disp: 60 tablet, Rfl: 1 .  traZODone (DESYREL) 100 MG tablet, Take 100 mg by mouth at bedtime., Disp: , Rfl:    Social History: Reviewed -  reports that she quit smoking about 8 years ago. Her smoking use included cigarettes. She has a 20.00 pack-year smoking history. She has never used smokeless tobacco.    Objective Findings:  Vitals: Blood pressure 119/72, pulse 78, height 5' 3.5" (1.613 m), weight 139 lb 9.6 oz (63.3  kg).  PHYSICAL EXAMINATION General appearance - alert, well appearing, and in no distress, oriented to person, place, and time, normal appearing weight, playful, active and well hydrated Mental status - alert, oriented to person, place, and time, normal mood, behavior, speech, dress, motor activity, and thought processes, affect appropriate to mood Chest - not examined Heart - not examined Abdomen - not examined Breasts - not examined declined by pt. Skin - normal coloration and turgor, no rashes, no suspicious skin lesions noted  PELVIC External genitalia - normal Vulva - normal  Vagina - good support Cervix - clear mucus visualized Uterus - mobile normal ssc Adnexa - nontender [place] - n/a Rectal - rectal exam declined by pt , was to receive Hemoccult cards x 3.    Assessment & Plan:   A:  1.  well woman exam 2. S/p endometrial ablation, with 2 lite days of menses 3. Resolved dysmenorrhea.  P:  1.  pap q 3 yr.    By signing my name below, I, [employer], attest that this documentation has been prepared under the direction and in the presence of Jonnie Kind, MD. Electronically Signed: [place]. 02/28/20. 3:20 PM.  I personally performed the services described in this documentation, which was SCRIBED in my presence. The recorded information has been reviewed and considered accurate. It has been edited as necessary during review. Jonnie Kind, MD

## 2020-03-05 LAB — CYTOLOGY - PAP
Chlamydia: NEGATIVE
Comment: NEGATIVE
Comment: NEGATIVE
Comment: NORMAL
Diagnosis: NEGATIVE
High risk HPV: NEGATIVE
Neisseria Gonorrhea: NEGATIVE

## 2021-04-03 ENCOUNTER — Other Ambulatory Visit: Payer: Self-pay

## 2021-04-03 ENCOUNTER — Ambulatory Visit
Admission: EM | Admit: 2021-04-03 | Discharge: 2021-04-03 | Disposition: A | Discharge status: Home / Self Care | Payer: No Typology Code available for payment source | Attending: Internal Medicine | Admitting: Internal Medicine

## 2021-04-03 DIAGNOSIS — M5489 Other dorsalgia: Secondary | ICD-10-CM

## 2021-04-03 DIAGNOSIS — M549 Dorsalgia, unspecified: Secondary | ICD-10-CM

## 2021-04-03 MED ORDER — TIZANIDINE HCL 4 MG PO TABS: 4.0000 mg | ORAL_TABLET | Freq: Every evening | ORAL | 0 refills | Status: DC | PRN

## 2021-04-03 NOTE — ED Triage Notes (Signed)
Pt presents with c/o low back pain after fall 2 weeks ago

## 2021-04-03 NOTE — Discharge Instructions (Addendum)
Gentle range of motion exercises Heating pad use for 10 minutes on-10 minutes off cycle x4 cycles twice a day Take medications as prescribed Return to urgent care if symptoms worsen.

## 2021-04-06 NOTE — ED Provider Notes (Signed)
[facility]    CSN: [id number] Arrival date & time: 04/03/21  1020      History   Chief Complaint Chief Complaint  Patient presents with   Back Pain    HPI [name] is a 40 y.o. female comes to the low back pain which started a couple of weeks ago after she fell.  Patient describes the pain as sharp and of moderate severity.  Pain is aggravated by movement.  No known relieving factors.  Patient has tried over-the-counter pain medications with no improvement in his symptoms.  She has muscle stiffness in the lower back.  She denies any radiation of the pain into the lower extremities.  No fever or chills.  No urinary problems or bowel problems.  She denies any numbness or tingling or weakness in the lower extremities. 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   IUD check up 06/27/2014   Chronic pain 04/04/2014   Chronic anxiety 04/04/2014   Hypertension 04/04/2014   Menorrhagia with regular cycle 03/21/2014   Dyspareunia 03/21/2014   Nausea and vomiting 01/26/2014   Impingement syndrome of shoulder region 12/21/2012   Pain in joint, shoulder region 12/21/2012   Elbow pain 07/02/2011   Acromioclavicular (joint) (ligament) sprain 06/10/2011   Pain in joint, upper arm 06/10/2011   Muscle weakness (generalized) 06/10/2011   Tennis elbow 04/23/2011   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   CELLULITIS, HAND 12/09/2010   NEOPLASMS UNSPEC NATURE BONE SOFT TISSUE&SKIN 11/18/2010    Past Surgical History:  Procedure Laterality Date   arm surgery Left    plate in arm   BILATERAL SALPINGECTOMY     BONE BIOPSY Right    thumb   BRAIN SURGERY     evacuation of hematoma   CESAREAN SECTION     x2    CHOLECYSTECTOMY     ECTOPIC PREGNANCY SURGERY     x2   ENDOMETRIAL ABLATION  01/18/2018   Procedure: ENDOMETRIAL ABLATION WITH MINERVA;  Surgeon: Jonnie Kind, MD;  Location: [facility];  Service: Gynecology;;   ESOPHAGOGASTRODUODENOSCOPY (EGD) WITH PROPOFOL N/A 01/30/2014   Procedure: ESOPHAGOGASTRODUODENOSCOPY (EGD) WITH PROPOFOL;  Surgeon: Danie Binder, MD;  Location: [facility];  Service: Endoscopy;  Laterality: N/A;   HYSTEROSCOPY WITH D & C N/A 08/13/2015   Procedure: DILATATION AND CURETTAGE /HYSTEROSCOPY;  Surgeon: Jonnie Kind, MD;  Location: [facility];  Service: Gynecology;  Laterality: N/A;   HYSTEROSCOPY WITH D & C  01/18/2018   Procedure: CERVICAL DILATATION /HYSTEROSCOPY;  Surgeon: Jonnie Kind, MD;  Location: [facility];  Service: Gynecology;;   LEG SURGERY Left    femur rodding and removal of part of left femur   otif ankle Right     OB History     Gravida  5   Para  2   Term  0   Preterm  0   AB  3   Living  2      SAB  1   IAB  0   Ectopic  2   Multiple  0   Live Births  2            Home  Medications    Prior to Admission medications   Medication Sig Start Date End Date Taking? Authorizing Provider  tiZANidine (ZANAFLEX) 4 MG tablet Take 1 tablet (4 mg total) by mouth at bedtime as needed for muscle spasms. 04/03/21  Yes Matas Burrows, Myrene Galas, MD  amLODipine (NORVASC) 10 MG tablet Take 10 mg by mouth daily.    [provider]  busPIRone (BUSPAR) 10 MG tablet Take 10 mg by mouth 2 (two) times daily. 01/12/20   [provider]  gabapentin (NEURONTIN) 300 MG capsule Take 300 mg by mouth 3 (three) times daily. 02/09/20   [provider]  HYDROcodone-acetaminophen (NORCO) 10-325 MG per tablet Take 1 tablet by mouth every 6 (six) hours as needed for moderate pain.    [provider]  Multiple Vitamins-Minerals (MULTIVITAMIN ADULT) CHEW Chew 2 each by mouth daily.    [provider]  Omega-3 Fatty Acids (FISH OIL  ADULT GUMMIES PO) Take by mouth.    [provider]  pantoprazole (PROTONIX) 40 MG tablet Take 1 tablet (40 mg total) by mouth 2 (two) times daily before a meal. 01/28/14   Theodis Blaze, MD  traZODone (DESYREL) 100 MG tablet Take 100 mg by mouth at bedtime. 12/21/19   [provider]    Family History Family History  Problem Relation Age of Onset   Mental illness Mother    Diabetes Mother    Obesity Mother    Obesity Sister    Diabetes Son        boarderline   Arthritis Other    Cancer Other    Diabetes Other    Cancer Maternal Grandfather        GASTRIC    Social History Social History   Tobacco Use   Smoking status: Former    Packs/day: 1.00    Years: 20.00    Pack years: 20.00    Types: Cigarettes    Quit date: 12/02/2011    Years since quitting: 9.3   Smokeless tobacco: Never  Vaping Use   Vaping Use: Never used  Substance Use Topics   Alcohol use: No   Drug use: No     Allergies   Demerol and Orange fruit [citrus]   Review of Systems Review of Systems  Gastrointestinal: Negative.   Musculoskeletal:  Positive for arthralgias. Negative for joint swelling, myalgias, neck pain and neck stiffness.  Neurological:  Negative for dizziness and numbness.    Physical Exam Triage Vital Signs ED Triage Vitals  Enc Vitals Group     BP 04/03/21 1102 (!) 144/87     Pulse Rate 04/03/21 1102 97     Resp 04/03/21 1102 18     Temp 04/03/21 1102 98.7 F (37.1 C)     Temp src --      SpO2 04/03/21 1102 99 %     Weight --      Height --      Head Circumference --      Peak Flow --      Pain Score 04/03/21 1059 7     Pain Loc --      Pain Edu? --      Excl. in [place]? --    No data found.  Updated Vital Signs BP (!) 144/87   Pulse 97   Temp 98.7 F (37.1 C)   Resp 18   SpO2 99%   Visual Acuity Right Eye Distance:   Left Eye Distance:   Bilateral Distance:  Right Eye Near:   Left Eye Near:    Bilateral Near:     Physical Exam Vitals  and nursing note reviewed.  Constitutional:      General: She is not in acute distress.    Appearance: She is not ill-appearing.  Cardiovascular:     Rate and Rhythm: Normal rate and regular rhythm.  Musculoskeletal:        General: Normal range of motion.     Comments: Tenderness tenderness over the sacroiliac joints, worse on the right side.  No bruising noted.  Straight leg raise test is negative.  Power is 5/5 in lower extremities.  No changes in sensation.  Neurological:     Mental Status: She is alert.     UC Treatments / Results  Labs (all labs ordered are listed, but only abnormal results are displayed) Labs Reviewed - No data to display  EKG   Radiology No results found.  Procedures Procedures (including critical care time)  Medications Ordered in UC Medications - No data to display  Initial Impression / Assessment and Plan / UC Course  I have reviewed the triage vital signs and the nursing notes.  Pertinent labs & imaging results that were available during my care of the patient were reviewed by me and considered in my medical decision making (see chart for details).     1.  Acute back pain without sciatica: Tizanidine as needed for back stiffness Heating pad use with a 10-minute - 10 minutes off cycle Range of motion exercises Take NSAIDs Return to urgent care if symptoms worsen. There is no indication for imaging at this time. Final Clinical Impressions(s) / UC Diagnoses   Final diagnoses:  Back pain without sciatica     Discharge Instructions      Gentle range of motion exercises Heating pad use for 10 minutes on-10 minutes off cycle x4 cycles twice a day Take medications as prescribed Return to urgent care if symptoms worsen.   ED Prescriptions     Medication Sig Dispense Auth. Provider   tiZANidine (ZANAFLEX) 4 MG tablet Take 1 tablet (4 mg total) by mouth at bedtime as needed for muscle spasms. 15 tablet Allie Ousley, Myrene Galas, MD       PDMP not reviewed this encounter.   Chase Picket, MD 04/06/21 [phone]

## 2021-04-09 ENCOUNTER — Other Ambulatory Visit (HOSPITAL_COMMUNITY): Payer: Self-pay | Admitting: Internal Medicine

## 2021-04-09 DIAGNOSIS — M5459 Other low back pain: Secondary | ICD-10-CM

## 2021-04-10 ENCOUNTER — Ambulatory Visit (HOSPITAL_COMMUNITY)
Admission: RE | Admit: 2021-04-10 | Discharge: 2021-04-10 | Disposition: A | Discharge status: Home / Self Care | Payer: No Typology Code available for payment source | Source: Ambulatory Visit | Attending: Internal Medicine | Admitting: Internal Medicine

## 2021-04-10 DIAGNOSIS — M5459 Other low back pain: Secondary | ICD-10-CM | POA: Insufficient documentation

## 2021-04-10 IMAGING — DX DG LUMBAR SPINE 2-3V
3 series · 3 of 3 positions shown · non-contrast
Comparison: None.

CLINICAL DATA: Fell 2 weeks ago, low back pain

EXAM:
LUMBAR SPINE - 2-3 VIEW

[l-spine ap]
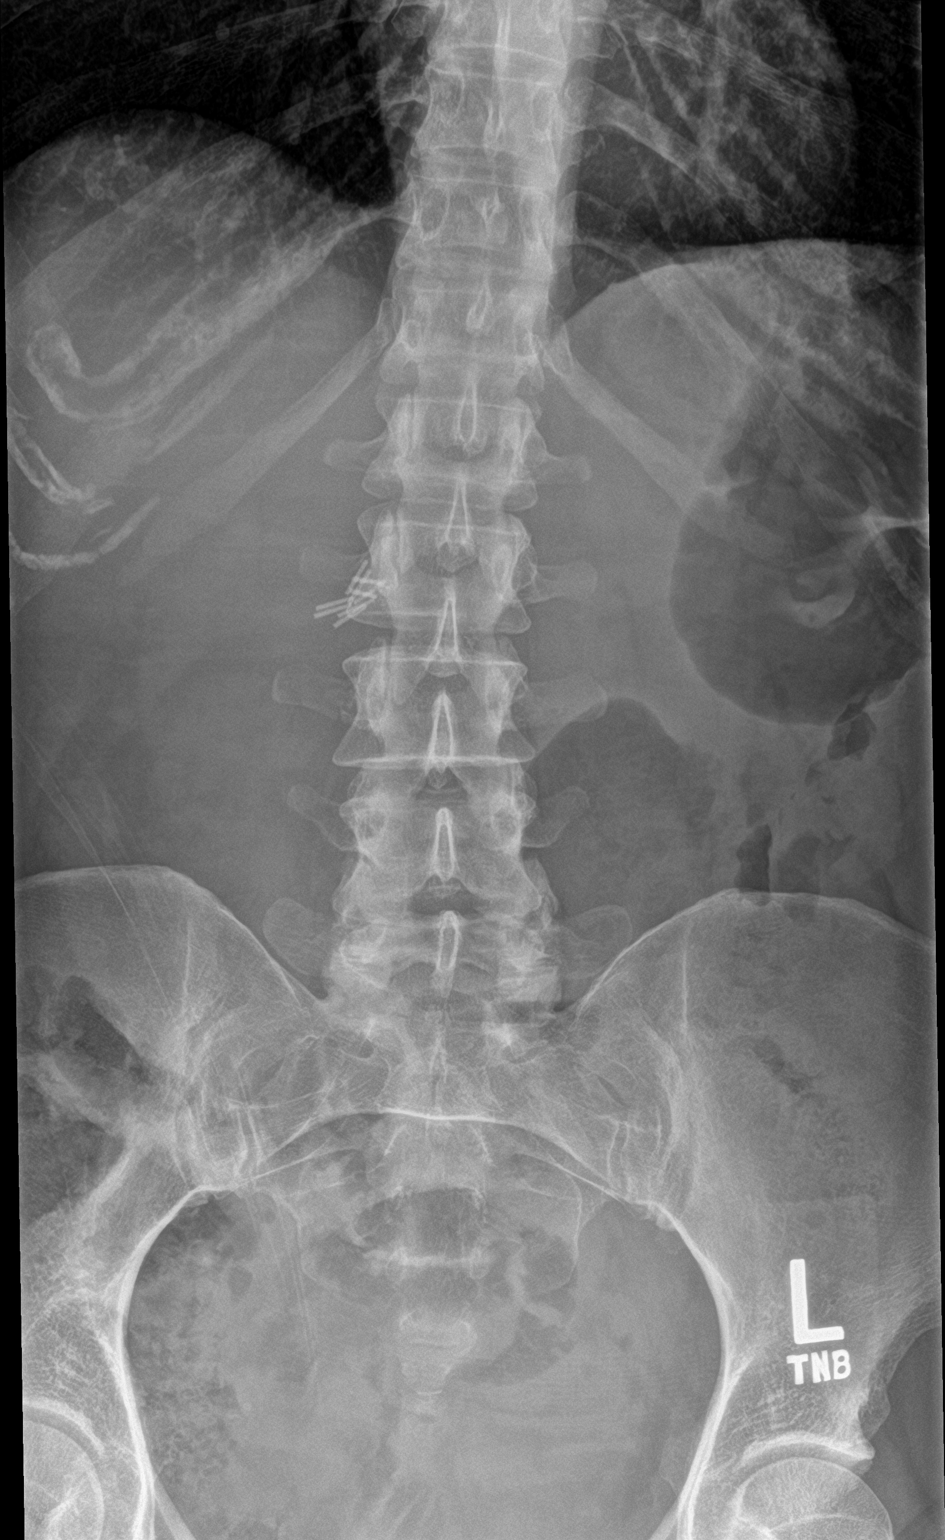

[l-spine lat]
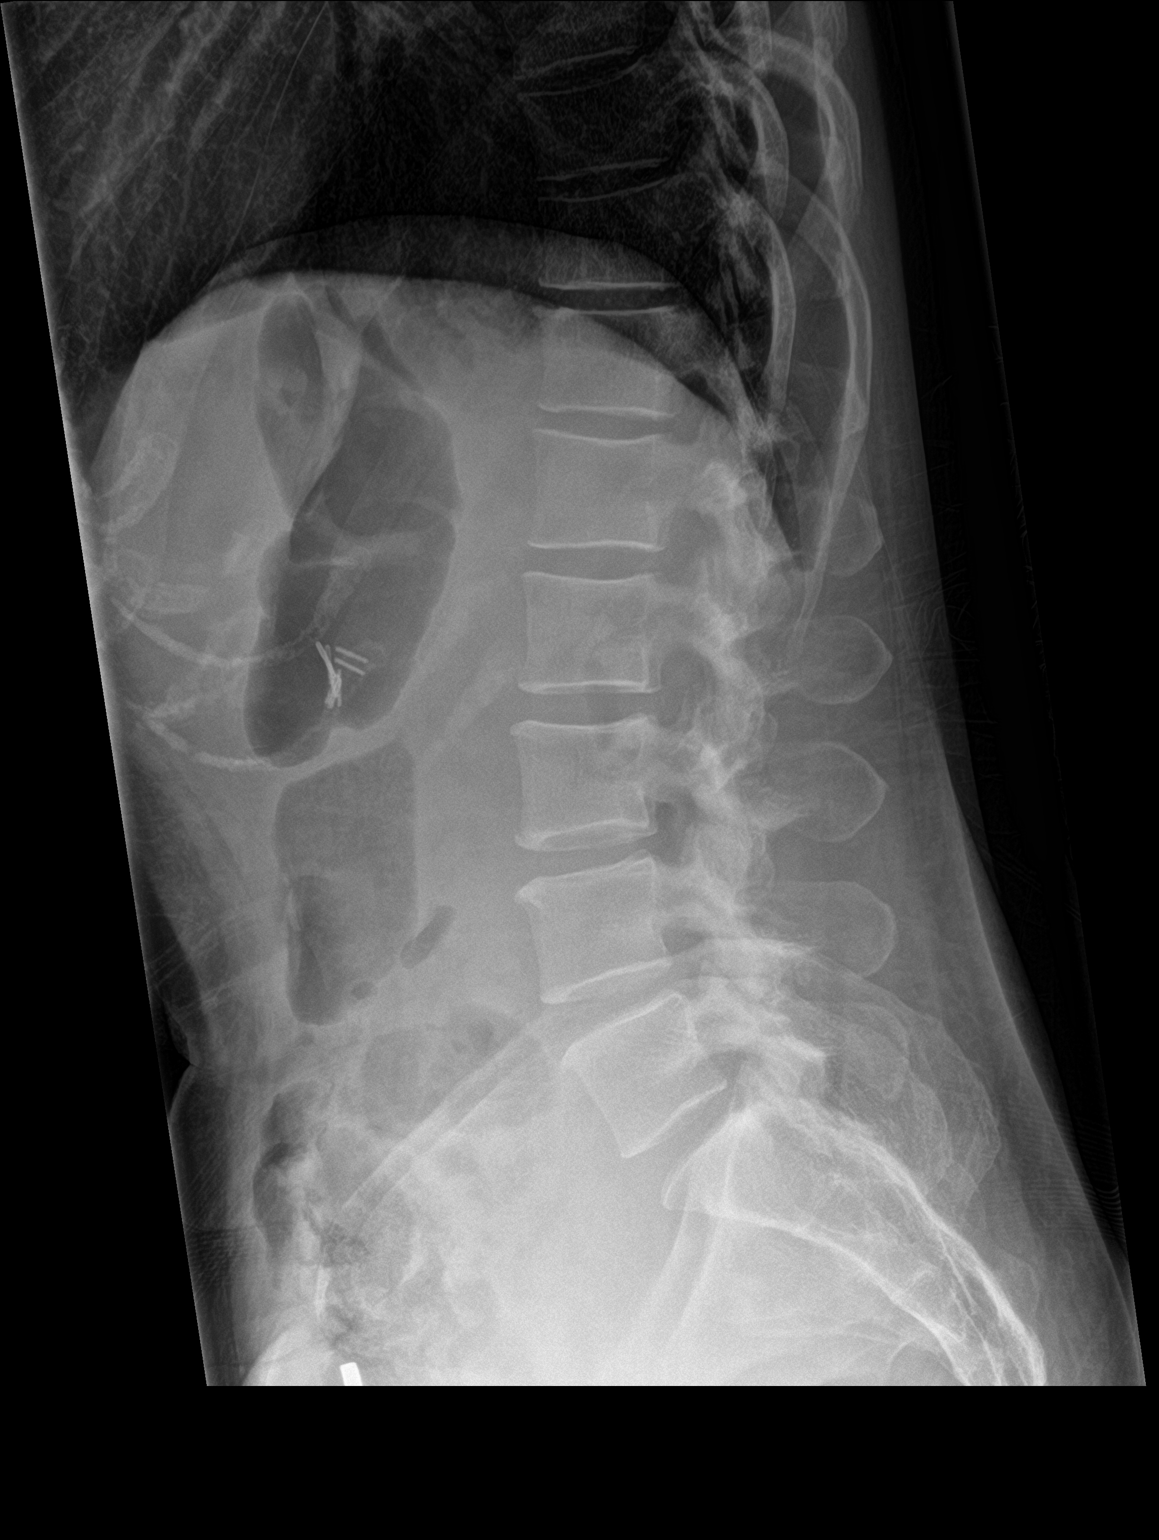

[l-spine spot]
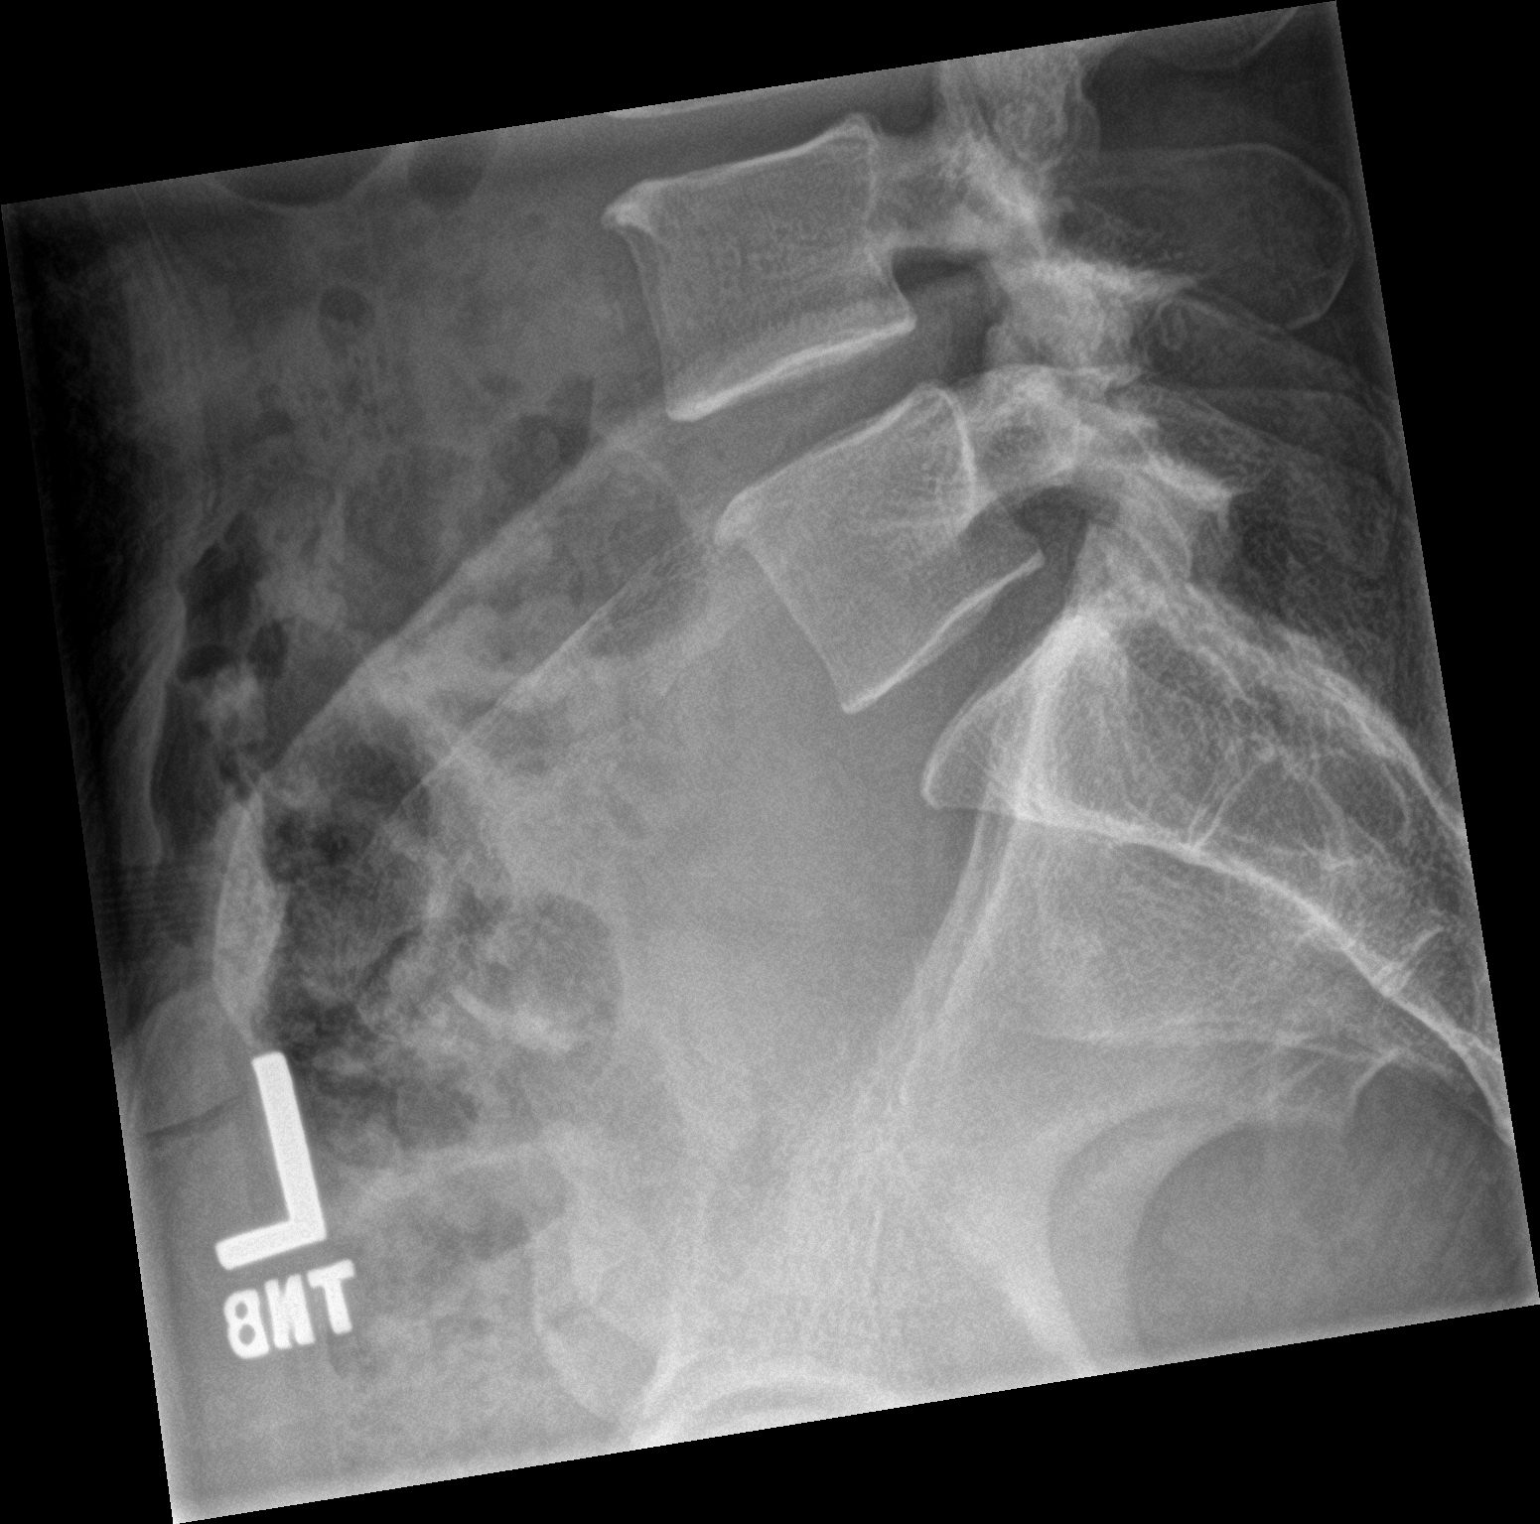

[3 of 3 positions shown; findings below may reference images not displayed]

FINDINGS: Frontal and lateral views of the lumbar spine demonstrate 5
non-rib-bearing lumbar type vertebral bodies in grossly normal
anatomic alignment. There are no acute fractures. Mild spondylosis
at L3-4, with prominent facet hypertrophic changes at L3-4, L4-5,
and L5-S1. Sacroiliac joints are normal.
IMPRESSION: 1. Lower lumbar spondylosis and facet hypertrophy. No acute bony
abnormality.

## 2021-04-21 NOTE — Progress Notes (Signed)
Office Visit Note  Patient: [name]             Date of Birth: 1981-08-29           MRN: [id number]             PCP: [facility] Referring: [employer], MD Visit Date: 04/22/2021 Occupation: Surgical Tech  Subjective:  New Patient (Initial Visit) (Patient complains of bilateral hand pain, stiffness, and swelling and patient complains of bilateral knee and bilateral foot pain. )   History of Present Illness: [name] is a 40 y.o. female here for evaluation of multiple joint pains especially involving bilateral hands knees and feet with positive rheumatoid factor.  She has very longstanding history for chronic joint pains and previous surgery for the head and back but these peripheral area involvements are worsening significantly during the past year.  She thinks especially around the past 3 to 6 months have been significantly increased symptoms.  She notices intermittent joint swelling and morning stiffness but has pain every day.  This is increased by the end of the day to the point she is very limited in her activities after work.  The joint swelling does seem to improve during the day also improved by running hot water over her hands.  She is on chronic low-dose opioid medication for her back pain and chronic pain syndrome with no recent change.  She has tried topical Voltaren with not very noticeable improvement. She has had numerous surgeries including internal fixation for left forearm and left femur. She has had ganglion cyst excision no other hand or wrist surgeries.  Her back pain is also currently in exacerbation after tripping over her cat on the stairs since 2 weeks ago.  Besides joint pain she describes erythematous rash across her chest and bilateral arms that she thinks worsens being out in the sun.  This does not involve the face denies any oral ulcers or lesions.  She notices discoloration and numbness in the tips of her fingers and  sometimes toes worsens with cold exposure.   Labs reviewed ANA neg RF 21.8 ESR 2  Activities of Daily Living:  Patient reports morning stiffness for 1-120 minutes.   Patient Reports nocturnal pain.  Difficulty dressing/grooming: Reports Difficulty climbing stairs: Reports Difficulty getting out of chair: Reports Difficulty using hands for taps, buttons, cutlery, and/or writing: Reports  Review of Systems  Constitutional:  Positive for fatigue.  HENT:  Positive for mouth dryness and nose dryness. Negative for mouth sores.   Eyes:  Positive for pain, itching and visual disturbance. Negative for dryness.  Respiratory:  Positive for cough, shortness of breath and difficulty breathing. Negative for hemoptysis.   Cardiovascular:  Negative for chest pain, palpitations and swelling in legs/feet.  Gastrointestinal:  Negative for abdominal pain, blood in stool, constipation and diarrhea.  Endocrine: Negative for increased urination.  Genitourinary:  Negative for painful urination.  Musculoskeletal:  Positive for joint pain, joint pain, joint swelling, morning stiffness and muscle tenderness. Negative for myalgias, muscle weakness and myalgias.  Skin:  Positive for color change, rash and redness.  Allergic/Immunologic: Negative for susceptible to infections.  Neurological:  Positive for dizziness, numbness, headaches, parasthesias and memory loss. Negative for weakness.  Hematological:  Positive for swollen glands.  Psychiatric/Behavioral:  Positive for sleep disturbance. Negative for confusion.    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   IUD check up 06/27/2014   Chronic pain 04/04/2014   Chronic anxiety 04/04/2014    Hypertension 04/04/2014   Menorrhagia with regular cycle 03/21/2014   Dyspareunia 03/21/2014   Nausea and vomiting 01/26/2014   Impingement syndrome of shoulder region 12/21/2012   Pain in joint, shoulder region 12/21/2012   Elbow pain 07/02/2011   Acromioclavicular (joint) (ligament) sprain 06/10/2011   Pain in joint, upper arm 06/10/2011   Muscle weakness (generalized) 06/10/2011   Tennis elbow 04/23/2011   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   CELLULITIS, HAND 12/09/2010   NEOPLASMS UNSPEC NATURE BONE SOFT TISSUE&SKIN 11/18/2010    Past Medical History:  Diagnosis Date   Acid reflux    Anxiety    Arthritis    Environmental allergies    Hypertension     Family History  Problem Relation Age of Onset   Mental illness Mother    Diabetes Mother    Obesity Mother    Heart disease Father    Obesity Sister    Lupus Paternal Aunt    Cancer Maternal Grandfather        GASTRIC   Lupus Paternal Grandmother    Diabetes Son        boarderline   Arthritis Other    Cancer Other    Diabetes Other    Past Surgical History:  Procedure Laterality Date   arm surgery Left    plate in arm   BILATERAL SALPINGECTOMY     BONE BIOPSY Right    thumb   BRAIN SURGERY     evacuation of hematoma   CESAREAN SECTION     x2   CHOLECYSTECTOMY     ECTOPIC PREGNANCY SURGERY     x2   ENDOMETRIAL ABLATION  01/18/2018   Procedure: ENDOMETRIAL ABLATION WITH MINERVA;  Surgeon: Tilda Burrow, MD;  Location: [facility];  Service: Gynecology;;   ESOPHAGOGASTRODUODENOSCOPY (EGD) WITH PROPOFOL N/A 01/30/2014   Procedure: ESOPHAGOGASTRODUODENOSCOPY (EGD) WITH PROPOFOL;  Surgeon: West Bali, MD;  Location: [facility];  Service: Endoscopy;  Laterality: N/A;   HYSTEROSCOPY WITH D & C N/A 08/13/2015   Procedure: DILATATION AND CURETTAGE /HYSTEROSCOPY;  Surgeon: Tilda Burrow, MD;  Location: [facility];  Service: Gynecology;  Laterality: N/A;   HYSTEROSCOPY WITH D & C  01/18/2018   Procedure: CERVICAL DILATATION  /HYSTEROSCOPY;  Surgeon: Tilda Burrow, MD;  Location: [facility];  Service: Gynecology;;   LEG SURGERY Left    femur rodding and removal of part of left femur   otif ankle Right    Social History   Social History Narrative   Not on file    There is no immunization history on file for this patient.   Objective: Vital Signs: BP 131/86 (BP Location: Right Arm, Patient Position: Sitting, Cuff Size: Normal)   Pulse 97   Ht 5' 2.5" (1.588 m)   Wt 139 lb 6.4 oz (63.2 kg)   BMI 25.09 kg/m    Physical Exam HENT:     Mouth/Throat:     Mouth: Mucous membranes are moist.     Pharynx: Oropharynx is clear.  Cardiovascular:     Rate and Rhythm: Normal rate and regular rhythm.  Pulmonary:     Effort: Pulmonary effort is normal.     Breath sounds: Normal breath sounds.  Skin:  General: Skin is warm and dry.     Comments: Erythematous rash with scattered flat red spots and telangiectasias across upper chest and bilateral upper arms  Neurological:     General: No focal deficit present.  Psychiatric:        Mood and Affect: Mood normal.     Comments: Tearful when discussing effect of symptoms on quality of life     Musculoskeletal Exam:  Neck full ROM, right side tenderness with lateral rotation Shoulders full ROM, bilateral shoulder and upper back pain with full abduction Elbows full ROM no tenderness or swelling Wrists full ROM, right side ganglion cyst radial head, tenderness to palpation with no synovitis Fingers bilateral tenderness to palpation over PIP and some MCP joints bilaterally, decreased active extension range of motion in second digits bilaterally, limited ultrasound inspection negative for synovitis pannus or color Doppler enhancement Diffuse paraspinal muscle tenderness exam limited today Knees full ROM, pain with full extension no palpable effusion Ankles full ROM no tenderness or swelling MTPs full ROM tenderness to squeeze pressure no visible  swelling    Investigation: No additional findings.  Imaging: DG Lumbar Spine 2-3 Views  Result Date: 04/10/2021 CLINICAL DATA:  Larey Seat 2 weeks ago, low back pain EXAM: LUMBAR SPINE - 2-3 VIEW COMPARISON:  None. FINDINGS: Frontal and lateral views of the lumbar spine demonstrate 5 non-rib-bearing lumbar type vertebral bodies in grossly normal anatomic alignment. There are no acute fractures. Mild spondylosis at L3-4, with prominent facet hypertrophic changes at L3-4, L4-5, and L5-S1. Sacroiliac joints are normal. IMPRESSION: 1. Lower lumbar spondylosis and facet hypertrophy. No acute bony abnormality. Electronically Signed   By: Sharlet Salina M.D.   On: 04/10/2021 12:22   XR Foot 2 Views Left  Result Date: 04/23/2021 X-ray left foot 2 views Normal tibiotalar joint space and alignment.  Very early calcaneal enthesophytes present.  Midfoot joints appear normal.  Normal MTP and IP joints. Impression No significant arthritis changes seen  XR Foot 2 Views Right  Result Date: 04/23/2021 X-ray right foot 2 views Surgical hardware present and distal tibia and fibula. Normal tibiotalar joint space and alignment.  No significant plantar posterior calcaneal enthesophytes.  Midfoot joints appear normal.  Normal MTP and IP joint spaces. Impression No significant arthritis changes seen  XR Hand 2 View Left  Result Date: 04/23/2021 X-ray left hand 2 views Surgical hardware in place in distal radius and ulna.  Radiocarpal joint space appears normal.  First CMC and carpal joints appear normal.  MCP PIP and DIP joint spaces are well-preserved.  No erosions or bone demineralization. Impression No significant arthritis changes are seen  XR Hand 2 View Right  Result Date: 04/23/2021 X-ray right hand 2 views Radiocarpal joint space appears normal.  CMC and carpal joints appear normal.  MCP PIP and DIP joint spaces are preserved.  No erosions are seen.  Bone mineralization appears normal. Impression No significant  arthritis changes   Recent Labs: Lab Results  Component Value Date   WBC 7.4 01/17/2018   HGB 12.9 01/17/2018   PLT 295 01/17/2018   NA 138 01/17/2018   K 4.1 01/17/2018   CL 103 01/17/2018   CO2 23 01/17/2018   GLUCOSE 105 (H) 01/17/2018   BUN 11 01/17/2018   CREATININE 0.73 01/17/2018   BILITOT 0.8 01/17/2018   ALKPHOS 40 01/17/2018   AST 20 01/17/2018   ALT 14 01/17/2018   PROT 7.6 01/17/2018   ALBUMIN 4.3 01/17/2018   CALCIUM 9.7 01/17/2018  GFRAA >60 01/17/2018    Speciality Comments: No specialty comments available.  Procedures:  No procedures performed Allergies: Demerol and Orange fruit [citrus]   Assessment / Plan:     Visit Diagnoses: Rheumatoid factor positive - Plan: Sedimentation rate, C-reactive protein, Rheumatoid factor, 14-3-3 eta Protein, Cyclic citrul peptide antibody, IgG, Serum protein electrophoresis with reflex  Positive rheumatoid factor with polyarticular joint pain chronically no specific inflammatory changes are seen on physical exam today.  We will check additional biomarkers 14 3 3  and CCP antibodies.  Also checking inflammatory labs sed rate and CRP.  A limited ultrasound exam was performed today also without evidence of significant inflammatory changes.  Bilateral hand pain - Plan: XR Hand 2 View Right, XR Hand 2 View Left Bilateral foot pain - Plan: XR Foot 2 Views Right, XR Foot 2 Views Left  Bilateral hand and foot pain that is chronic and greater in proportion to the amount of inflammation seen today.  We will check bilateral hand and foot x-rays for any degenerative changes or evidence of chronic inflammatory changes.  Other chronic pain  Numerous in somewhat generalized body pain symptoms with multiple past injuries and chronic medical problems could represent more centralized pain problem and fibromyalgia syndrome.  If no specific inflammatory labs or changes are seen may need further management of this process.  She is also seeing  orthopedics for follow-up of her back injury.  Orders: Orders Placed This Encounter  Procedures   XR Hand 2 View Right   XR Hand 2 View Left   XR Foot 2 Views Right   XR Foot 2 Views Left   Sedimentation rate   C-reactive protein   Rheumatoid factor   14-3-3 eta Protein   Cyclic citrul peptide antibody, IgG   Serum protein electrophoresis with reflex   No orders of the defined types were placed in this encounter.    Follow-Up Instructions: Return in about 3 weeks (around 05/13/2021) for New pt f/u RF+, joint pains.   Collier Salina, MD  Note - This record has been created using [employer].  Chart creation errors have been sought, but may not always  have been located. Such creation errors do not reflect on  the standard of medical care.

## 2021-04-22 ENCOUNTER — Ambulatory Visit: Payer: Self-pay

## 2021-04-22 ENCOUNTER — Other Ambulatory Visit: Payer: Self-pay

## 2021-04-22 ENCOUNTER — Encounter: Payer: Self-pay | Admitting: Internal Medicine

## 2021-04-22 ENCOUNTER — Ambulatory Visit (INDEPENDENT_AMBULATORY_CARE_PROVIDER_SITE_OTHER): Payer: No Typology Code available for payment source | Admitting: Internal Medicine

## 2021-04-22 VITALS — BP 131/86 | HR 97 | Ht 62.5 in | Wt 139.4 lb

## 2021-04-22 DIAGNOSIS — M79641 Pain in right hand: Secondary | ICD-10-CM

## 2021-04-22 DIAGNOSIS — M79642 Pain in left hand: Secondary | ICD-10-CM | POA: Diagnosis not present

## 2021-04-22 DIAGNOSIS — G8929 Other chronic pain: Secondary | ICD-10-CM

## 2021-04-22 DIAGNOSIS — M79672 Pain in left foot: Secondary | ICD-10-CM | POA: Diagnosis not present

## 2021-04-22 DIAGNOSIS — M79671 Pain in right foot: Secondary | ICD-10-CM

## 2021-04-22 DIAGNOSIS — R768 Other specified abnormal immunological findings in serum: Secondary | ICD-10-CM

## 2021-04-22 NOTE — Patient Instructions (Addendum)
Anticyclic-Citrullinated Peptide Antibody Test Why am I having this test? You may have the anticyclic-citrullinated peptide antibody test done to help: Diagnose rheumatoid arthritis (RA). RA is a long-term (chronic) disease that causes inflammation in the joints. Determine the severity of your RA, including how much worse it is getting (progression). This test may be done if you have unexplained joint inflammation and have previously tested negative for rheumatoid factor. It may also be done if you have been diagnosed with undifferentiated arthritis and your health careprovider suspects rheumatoid arthritis. What is being tested? This test checks your blood for the presence of anticyclic-citrullinated peptide antibodies. Antibodies are cells that are part of the body's disease-fighting (immune) system. These antibodies appear early in the course of RA and are thought tobe directly involved in the progression of the disease. What kind of sample is taken?  A blood sample is required for this test. It is usually collected by insertinga needle into a blood vessel. How are the results reported? Your test results will be reported as either positive or negative. A result is considered negative if there is less than 20 units of the antibody per mL ofblood. What do the results mean? A positive blood test may mean that you have RA. A negative blood test means that it is less likely that you have RA. However, a negative test does not completely rule out rheumatoid arthritis. Talk with your health care provider about what your results mean. Questions to ask your health care provider Ask your health care provider, or the department that is doing the test: When will my results be ready? How will I get my results? What are my treatment options? What other tests do I need? What are my next steps? Summary The anticyclic-citrullinated peptide antibody blood test may be done to help your health care provider  diagnose rheumatoid arthritis (RA). This test checks your blood for the presence of anticyclic-citrullinated peptide antibodies. These antibodies appear early in the course of RA. A positive blood test may mean that you have RA.  Rheumatoid Factor Test Why am I having this test? The rheumatoid factor test is used to help diagnose certain autoimmune diseases. Normally, your body makes protective proteins called antibodies (IgM, IgG, and IgA) to help fight off infections. If you have an autoimmune disease, your body may make a collection of antibodies that do not function correctly (autoantibodies). They attack tissues that are wrongly identified as foreign. In some autoimmunediseases, these autoantibodies are known as the rheumatoid factor (RF). You may have this test if your health care provider suspects that you have an autoimmune disease, such as: Rheumatoid arthritis (RA). Systemic lupus erythematosus (SLE). Sjgren's syndrome. Mixed connective tissue disease. A majority of people who have rheumatoid arthritis have a positive rheumatoid factor. Raised levels of these autoantibodies can also sometimes be a sign of other autoimmune diseases. However, it is also possible for the RF test to be negative even when a disease is present. Likewise, a small number of people may have a positive RF test when an autoimmune disease is not actually present.Other tests may be needed to help make a diagnosis. What is being tested? This test checks your blood for the RF autoantibodies. What kind of sample is taken?  A blood sample is required for this test. It is usually collected by insertinga needle into a blood vessel or by sticking a finger with a small needle. How are the results reported? Your test result will be reported as either positive or  negative for RFautoantibodies. What do the results mean? A negative result means that no RF or only a small amount was found in yourblood. This means that it is  unlikely that you have an autoimmune disease. A positive result means that a larger amount of RF autoantibodies was found in your blood. This may indicate that you have RA or another autoimmune disease. Your health care provider will talk to you about doing more tests to confirmyour results. Talk with your health care provider about what your results mean. Questions to ask your health care provider Ask your health care provider, or the department that is doing the test: When will my results be ready? How will I get my results? What are my treatment options? What other tests do I need? What are my next steps? Summary The rheumatoid factor test is used to help diagnose certain autoimmune diseases. In some autoimmune diseases, the body makes autoantibodies known as the rheumatoid factor (RF), which attack tissues that are wrongly identified as foreign. A negative result means that no RF or only a small amount was found in your blood. A positive result means that a larger amount of RF autoantibodies was found in your blood. Talk with your health care provider about what your results mean.  Erythrocyte Sedimentation Rate Test Why am I having this test? The erythrocyte sedimentation rate (ESR) test is used to help find illnesses related to: Sudden (acute) or long-term (chronic) infections. Inflammation. The body's disease-fighting system attacking healthy cells (autoimmune diseases). Cancer. Tissue death. If you have symptoms that may be related to any of these illnesses, your health care provider may do an ESR test before doing more specific tests. If you have an inflammatory immune disease, such as rheumatoid arthritis, you may have thistest to help monitor your therapy. What is being tested? This test measures how long it takes for your red blood cells (erythrocytes) to settle in a solution over a certain amount of time (sedimentation rate). When you have an infection or inflammation, your  red blood cells clump together and settle faster. The sedimentation rate provides information abouthow much inflammation is present in the body. What kind of sample is taken?  A blood sample is required for this test. It is usually collected by insertinga needle into a blood vessel. How do I prepare for this test? Follow any instructions from your health care provider about changing orstopping your regular medicines. Tell a health care provider about: Any allergies you have. All medicines you are taking, including vitamins, herbs, eye drops, creams, and over-the-counter medicines. Any blood disorders you have. Any surgeries you have had. Any medical conditions you have, such as thyroid or kidney disease. Whether you are pregnant or may be pregnant. How are the results reported? Your results will be reported as a value that measures sedimentation rate in millimeters per hour (mm/hr). Your health care provider will compare your results to normal ranges that were established after testing a large group of people (reference values). Reference values may vary among labs and hospitals. For this test, common reference values, which vary by age and gender, are: Newborn: 0-2 mm/hr. Child, up to puberty: 0-10 mm/hr. Female: Under 50 years: 0-20 mm/hr. 50-85 years: 0-30 mm/hr. Over 85 years: 0-42 mm/hr. Female: Under 50 years: 0-15 mm/hr. 50-85 years: 0-20 mm/hr. Over 85 years: 0-30 mm/hr. Certain conditions or medicines may cause ESR levels to be falsely lower or higher, such as: Pregnancy. Obesity. Steroids, birth control pills, and blood thinners. Thyroid  or kidney disease. What do the results mean? Results that are within reference values are considered normal, meaning that the level of inflammation in your body is healthy. High ESR levels mean that there is inflammation in your body. You will have more tests to help make adiagnosis. Inflammation may result from many different conditions or  injuries. Talk with your health care provider about what your results mean. Questions to ask your health care provider Ask your health care provider, or the department that is doing the test: When will my results be ready? How will I get my results? What are my treatment options? What other tests do I need? What are my next steps? Summary The erythrocyte sedimentation rate (ESR) test is used to help find illnesses associated with sudden (acute) or long-term (chronic) infections, inflammation, autoimmune diseases, cancer, or tissue death. If you have symptoms that may be related to any of these illnesses, your health care provider may do an ESR test before doing more specific tests. If you have an inflammatory immune disease, such as rheumatoid arthritis, you may have this test to help monitor your therapy. This test measures how long it takes for your red blood cells (erythrocytes) to settle in a solution over a certain amount of time (sedimentation rate). This provides information about how much inflammation is present in the body.  C-Reactive Protein Test Why am I having this test? The C-reactive protein (CRP) is a substance that the liver releases in response to inflammation within the body. You may have a CRP test to help diagnose: Serious bacterial or fungal infections. Inflammatory diseases, such as inflammatory bowel disease. Lupus, rheumatoid arthritis, or other autoimmune diseases. What is being tested? This test checks for the level of CRP in your blood. The level of CRP in yourbody increases greatly after a heart attack, infection, or injury. What kind of sample is taken?  A blood sample is required for this test. It is usually collected by insertinga needle into a blood vessel. Tell a health care provider about: All medicines you are taking, including vitamins, herbs, eye drops, creams, and over-the-counter medicines. Any medical conditions you have. The use of birth control  pills or hormone replacement therapy such as estrogen. Any blood disorders you have. Whether you are pregnant or may be pregnant. How are the results reported? Your test results will be reported as a value that indicates how much CRP is in your blood. This will be reported as milligrams of CRP per liter (mg/L) ofblood. Your health care provider will compare your results to normal ranges that were established after testing a large group of people (reference ranges). Reference ranges may vary among labs and hospitals. For this test, thestandard CRP reference value is less than 10 mg/L. What do the results mean? A standard CRP test result that is less than 10 mg/L is considered normal, meaning that you do not have an abnormally high level of inflammation in yourbody. A standard CRP test result that is greater than 10 mg/L means that there is an abnormally high level of inflammation in your body that is causing CRP to be released. Inflammation may result from many different conditions or injuries.You will have more tests to help make a diagnosis. Talk with your health care provider about what your results mean. Questions to ask your health care provider Ask your health care provider, or the department that is doing the test: When will my results be ready? How will I get my results? What are  my treatment options? What other tests do I need? What are my next steps? Summary C-reactive protein (CPR) is a substance released by the liver in response to inflammation within the body. The CRP test may be performed to help diagnose infection and inflammatory conditions. Talk with your health care provider about what your results mean.

## 2021-04-25 ENCOUNTER — Other Ambulatory Visit (HOSPITAL_COMMUNITY): Payer: Self-pay | Admitting: Orthopedic Surgery

## 2021-04-25 DIAGNOSIS — M5459 Other low back pain: Secondary | ICD-10-CM

## 2021-04-29 ENCOUNTER — Ambulatory Visit (HOSPITAL_COMMUNITY): Payer: No Typology Code available for payment source

## 2021-04-29 ENCOUNTER — Encounter (HOSPITAL_COMMUNITY): Payer: Self-pay

## 2021-04-29 LAB — PROTEIN ELECTROPHORESIS, SERUM, WITH REFLEX
Albumin ELP: 4.4 g/dL (ref 3.8–4.8)
Alpha 1: 0.2 g/dL (ref 0.2–0.3)
Alpha 2: 0.6 g/dL (ref 0.5–0.9)
Beta 2: 0.3 g/dL (ref 0.2–0.5)
Beta Globulin: 0.4 g/dL (ref 0.4–0.6)
Gamma Globulin: 0.9 g/dL (ref 0.8–1.7)
Total Protein: 6.9 g/dL (ref 6.1–8.1)

## 2021-04-29 LAB — CYCLIC CITRUL PEPTIDE ANTIBODY, IGG: Cyclic Citrullin Peptide Ab: <16 UNITS

## 2021-04-29 LAB — RHEUMATOID FACTOR: Rheumatoid fact SerPl-aCnc: 20 IU/mL — ABNORMAL HIGH (ref <14)

## 2021-04-29 LAB — 14-3-3 ETA PROTEIN: 14-3-3 eta Protein: <0.2 ng/mL (ref <0.2)

## 2021-04-29 LAB — SEDIMENTATION RATE: Sed Rate: 2 mm/h (ref 0–20)

## 2021-04-29 LAB — C-REACTIVE PROTEIN: CRP: 0.2 mg/L (ref <8.0)

## 2021-05-01 NOTE — Progress Notes (Signed)
Hand xrays do not show any significant joint changes or damage from arthritis.

## 2021-05-01 NOTE — Progress Notes (Signed)
Lab results show the rheumatoid arthritis antibodies are positive but no markers indicating a lot of active inflammation at this time. We can follow up as planned to take another look, although I may not recommend starting new medicines for RA if there isn't more clear evidence of inflammation.

## 2021-05-12 ENCOUNTER — Ambulatory Visit (HOSPITAL_COMMUNITY): Payer: No Typology Code available for payment source

## 2021-05-12 NOTE — Progress Notes (Signed)
Office Visit Note  Patient: [name]             Date of Birth: 06-Aug-1981           MRN: [id number]             PCP: [facility] Referring: [facility] Visit Date: 05/13/2021   Subjective:  Follow-up (Patient complains of continued bilateral hand pain, stiffness, and swelling and patient complains of bilateral knee and bilateral foot pain. Lumbar MRI: 05/13/2021. )   History of Present Illness: [name] is a 40 y.o. female here for follow up for multiple chronic joint pains with positive rheumatoid factor.  X-rays and laboratory testing at last visit redemonstrated positive rheumatoid factor but negative for other autoantibodies and negative for elevated inflammatory markers.  X-ray changes reviewed were degenerative without any erosive inflammatory disease evidence.  Since her last visit she continues describing hand pain and stiffness swelling as well as bilateral knee and foot pain.  The worst finger pain she describes is associated with violaceous or erythematous discoloration from the MCP joints distally on her bilateral hands often with extremely cold and tingling sensation.  Previous HPI: 04/22/21 [name] is a 40 y.o. female here for evaluation of multiple joint pains especially involving bilateral hands knees and feet with positive rheumatoid factor.  She has very longstanding history for chronic joint pains and previous surgery for the head and back but these peripheral area involvements are worsening significantly during the past year.  She thinks especially around the past 3 to 6 months have been significantly increased symptoms.  She notices intermittent joint swelling and morning stiffness but has pain every day.  This is increased by the end of the day to the point she is very limited in her activities after work.  The joint swelling does seem to improve during the day also improved by running hot water over her hands.   She is on chronic low-dose opioid medication for her back pain and chronic pain syndrome with no recent change.  She has tried topical Voltaren with not very noticeable improvement. She has had numerous surgeries including internal fixation for left forearm and left femur. She has had ganglion cyst excision no other hand or wrist surgeries.  Her back pain is also currently in exacerbation after tripping over her cat on the stairs since 2 weeks ago.   Besides joint pain she describes erythematous rash across her chest and bilateral arms that she thinks worsens being out in the sun.  This does not involve the face denies any oral ulcers or lesions.  She notices discoloration and numbness in the tips of her fingers and sometimes toes worsens with cold exposure.   Labs reviewed ANA neg RF 21.8 ESR 2   Review of Systems  Constitutional:  Positive for fatigue.  HENT:  Positive for mouth sores and mouth dryness. Negative for nose dryness.   Eyes:  Positive for dryness. Negative for pain, itching and visual disturbance.  Respiratory:  Positive for cough and shortness of breath. Negative for hemoptysis and difficulty breathing.   Cardiovascular:  Positive for swelling in legs/feet. Negative for chest pain and palpitations.  Gastrointestinal:  Positive for abdominal pain, constipation and diarrhea. Negative for blood in stool.  Endocrine: Negative for increased urination.  Genitourinary:  Negative for painful urination.  Musculoskeletal:  Positive for joint pain, joint pain, joint swelling, myalgias, muscle weakness, morning stiffness, muscle tenderness and myalgias.  Skin:  Positive for rash. Negative for color change and redness.  Allergic/Immunologic: Negative for susceptible to infections.  Neurological:  Positive for headaches. Negative for dizziness, numbness, memory loss and weakness.  Hematological:  Positive for swollen glands.  Psychiatric/Behavioral:  Positive for sleep disturbance. Negative  for confusion.    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   IUD check up 06/27/2014   Chronic pain 04/04/2014   Chronic anxiety 04/04/2014   Hypertension 04/04/2014   Menorrhagia with regular cycle 03/21/2014   Dyspareunia 03/21/2014   Nausea and vomiting 01/26/2014   Impingement syndrome of shoulder region 12/21/2012   Pain in joint, shoulder region 12/21/2012   Elbow pain 07/02/2011   Acromioclavicular (joint) (ligament) sprain 06/10/2011   Pain in joint, upper arm 06/10/2011   Muscle weakness (generalized) 06/10/2011   Tennis elbow 04/23/2011   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   CELLULITIS, HAND 12/09/2010   NEOPLASMS UNSPEC NATURE BONE SOFT TISSUE&SKIN 11/18/2010    Past Medical History:  Diagnosis Date   Acid reflux    Anxiety    Arthritis    Environmental allergies    Hypertension     Family History  Problem Relation Age of Onset   Mental illness Mother    Diabetes Mother    Obesity Mother    Heart disease Father    Obesity Sister    Lupus Paternal Aunt    Cancer Maternal Grandfather        GASTRIC   Lupus Paternal Grandmother    Diabetes Son        boarderline   Arthritis Other    Cancer Other    Diabetes Other    Past Surgical History:  Procedure Laterality Date   arm surgery Left    plate in arm   BILATERAL SALPINGECTOMY     BONE BIOPSY Right    thumb   BRAIN SURGERY     evacuation of hematoma   CESAREAN SECTION     x2   CHOLECYSTECTOMY     ECTOPIC PREGNANCY SURGERY     x2   ENDOMETRIAL ABLATION  01/18/2018   Procedure: ENDOMETRIAL ABLATION WITH MINERVA;  Surgeon: Jonnie Kind, MD;  Location: [facility];  Service: Gynecology;;   ESOPHAGOGASTRODUODENOSCOPY  (EGD) WITH PROPOFOL N/A 01/30/2014   Procedure: ESOPHAGOGASTRODUODENOSCOPY (EGD) WITH PROPOFOL;  Surgeon: Danie Binder, MD;  Location: [facility];  Service: Endoscopy;  Laterality: N/A;   HYSTEROSCOPY WITH D & C N/A 08/13/2015   Procedure: DILATATION AND CURETTAGE /HYSTEROSCOPY;  Surgeon: Jonnie Kind, MD;  Location: [facility];  Service: Gynecology;  Laterality: N/A;   HYSTEROSCOPY WITH D & C  01/18/2018   Procedure: CERVICAL DILATATION /HYSTEROSCOPY;  Surgeon: Jonnie Kind, MD;  Location: [facility];  Service: Gynecology;;   LEG SURGERY Left    femur rodding and removal of part of left femur   otif ankle Right    Social History   Social History Narrative   Not on file    There is no immunization history on file for this patient.   Objective: Vital Signs: BP 124/77 (BP Location: Left Arm, Patient Position: Sitting, Cuff Size: Normal)   Pulse 99   Ht 5' 2.5" (1.588 m)  Wt 138 lb (62.6 kg)   BMI 24.84 kg/m    Physical Exam Neurological:     General: No focal deficit present.     Mental Status: She is alert.     Musculoskeletal Exam:  Right wrist ganglion cyst near the radial head normal range of motion bilaterally with no synovitis Fingers bilaterally with tenderness to palpation over MCP and PIP joints, no synovitis, decreased extension range of motion in the second digit   Investigation: No additional findings.  Imaging: MR LUMBAR SPINE WO CONTRAST  Result Date: 05/13/2021 CLINICAL DATA:  Status post fall down the stairs. EXAM: MRI LUMBAR SPINE WITHOUT CONTRAST TECHNIQUE: Multiplanar, multisequence MR imaging of the lumbar spine was performed. No intravenous contrast was administered. COMPARISON:  None. FINDINGS: Segmentation:  Standard. Alignment:  Physiologic. Vertebrae:  No fracture, evidence of discitis, or bone lesion. Conus medullaris and cauda equina: Conus extends to the T12 level. Conus and cauda equina appear normal. Paraspinal and other soft tissues: No acute  paraspinal abnormality. Disc levels: Disc spaces: Disc spaces are maintained. T12-L1: No significant disc bulge. No neural foraminal stenosis. No central canal stenosis. L1-L2: No significant disc bulge. No neural foraminal stenosis. No central canal stenosis. L2-L3: No significant disc bulge. No neural foraminal stenosis. No central canal stenosis. L3-L4: No significant disc bulge. No neural foraminal stenosis. No central canal stenosis. L4-L5: No significant disc bulge. No neural foraminal stenosis. No central canal stenosis. Mild bilateral facet arthropathy. L5-S1: No significant disc bulge. No neural foraminal stenosis. No central canal stenosis. Mild bilateral facet arthropathy. IMPRESSION: 1.  No acute osseous injury of the lumbar spine. 2. No significant lumbar spine disc protrusion, foraminal stenosis or central canal stenosis. Mild bilateral facet arthropathy at L4-5 and L5-S1. Electronically Signed   By: Kathreen Devoid   On: 05/13/2021 09:22   XR Foot 2 Views Left  Result Date: 04/23/2021 X-ray left foot 2 views Normal tibiotalar joint space and alignment.  Very early calcaneal enthesophytes present.  Midfoot joints appear normal.  Normal MTP and IP joints. Impression No significant arthritis changes seen  XR Foot 2 Views Right  Result Date: 04/23/2021 X-ray right foot 2 views Surgical hardware present and distal tibia and fibula. Normal tibiotalar joint space and alignment.  No significant plantar posterior calcaneal enthesophytes.  Midfoot joints appear normal.  Normal MTP and IP joint spaces. Impression No significant arthritis changes seen  XR Hand 2 View Left  Result Date: 04/23/2021 X-ray left hand 2 views Surgical hardware in place in distal radius and ulna.  Radiocarpal joint space appears normal.  First CMC and carpal joints appear normal.  MCP PIP and DIP joint spaces are well-preserved.  No erosions or bone demineralization. Impression No significant arthritis changes are seen  XR  Hand 2 View Right  Result Date: 04/23/2021 X-ray right hand 2 views Radiocarpal joint space appears normal.  CMC and carpal joints appear normal.  MCP PIP and DIP joint spaces are preserved.  No erosions are seen.  Bone mineralization appears normal. Impression No significant arthritis changes   Recent Labs: Lab Results  Component Value Date   WBC 7.4 01/17/2018   HGB 12.9 01/17/2018   PLT 295 01/17/2018   NA 138 01/17/2018   K 4.1 01/17/2018   CL 103 01/17/2018   CO2 23 01/17/2018   GLUCOSE 105 (H) 01/17/2018   BUN 11 01/17/2018   CREATININE 0.73 01/17/2018   BILITOT 0.8 01/17/2018   ALKPHOS 40 01/17/2018   AST 20 01/17/2018  ALT 14 01/17/2018   PROT 6.9 04/22/2021   ALBUMIN 4.3 01/17/2018   CALCIUM 9.7 01/17/2018   GFRAA >60 01/17/2018    Speciality Comments: No specialty comments available.  Procedures:  No procedures performed Allergies: Demerol and Orange fruit [citrus]   Assessment / Plan:     Visit Diagnoses: Bilateral hand pain Rheumatoid factor positive  Continued bilateral hand pain with proximal joint stiffness and decreased range of motion several cystic changes but x-rays lab work and exam are without any evidence of active inflammatory arthritis in the affected areas.  Based on this I do not currently recommend starting any DMARD treatment at this time.  Raynaud's phenomenon without gangrene - Plan: sildenafil (REVATIO) 20 MG tablet  Finger discoloration with cold is an unusual distribution for typical Raynaud's.  However considering the described amount of symptoms impact and positive serology will recommend a trial of oral sildenafil 20 mg taken once daily initially for this.  Orders: No orders of the defined types were placed in this encounter.  Meds ordered this encounter  Medications   sildenafil (REVATIO) 20 MG tablet    Sig: Take 1 tablet (20 mg total) by mouth daily.    Dispense:  30 tablet    Refill:  0      Follow-Up Instructions:  Return in about 4 weeks (around 06/10/2021) for ?RA + Raynauds f/u 4wks.   Collier Salina, MD  Note - This record has been created using [employer].  Chart creation errors have been sought, but may not always  have been located. Such creation errors do not reflect on  the standard of medical care.

## 2021-05-13 ENCOUNTER — Other Ambulatory Visit: Payer: Self-pay

## 2021-05-13 ENCOUNTER — Ambulatory Visit: Payer: No Typology Code available for payment source | Admitting: Internal Medicine

## 2021-05-13 ENCOUNTER — Encounter: Payer: Self-pay | Admitting: Internal Medicine

## 2021-05-13 ENCOUNTER — Ambulatory Visit (HOSPITAL_COMMUNITY)
Admission: RE | Admit: 2021-05-13 | Discharge: 2021-05-13 | Disposition: A | Discharge status: Home / Self Care | Payer: No Typology Code available for payment source | Source: Ambulatory Visit | Attending: Orthopedic Surgery | Admitting: Orthopedic Surgery

## 2021-05-13 ENCOUNTER — Ambulatory Visit (INDEPENDENT_AMBULATORY_CARE_PROVIDER_SITE_OTHER): Payer: No Typology Code available for payment source | Admitting: Internal Medicine

## 2021-05-13 VITALS — BP 124/77 | HR 99 | Ht 62.5 in | Wt 138.0 lb

## 2021-05-13 DIAGNOSIS — M79641 Pain in right hand: Secondary | ICD-10-CM

## 2021-05-13 DIAGNOSIS — M79642 Pain in left hand: Secondary | ICD-10-CM | POA: Diagnosis not present

## 2021-05-13 DIAGNOSIS — R768 Other specified abnormal immunological findings in serum: Secondary | ICD-10-CM

## 2021-05-13 DIAGNOSIS — M5459 Other low back pain: Secondary | ICD-10-CM | POA: Insufficient documentation

## 2021-05-13 DIAGNOSIS — I7300 Raynaud's syndrome without gangrene: Secondary | ICD-10-CM | POA: Diagnosis not present

## 2021-05-13 IMAGING — MR MR LUMBAR SPINE W/O CM
5 series · 31 of 48 positions shown · non-contrast
Comparison: None.

CLINICAL DATA: Status post fall down the stairs.

EXAM:
MRI LUMBAR SPINE WITHOUT CONTRAST
TECHNIQUE: Multiplanar, multisequence MR imaging of the lumbar spine was
performed. No intravenous contrast was administered.

[Series 5: T2 · sagittal · 4.0mm · 0.68mm/px · 6 of 15 slices shown (1 of 2)]
[im 1/15]
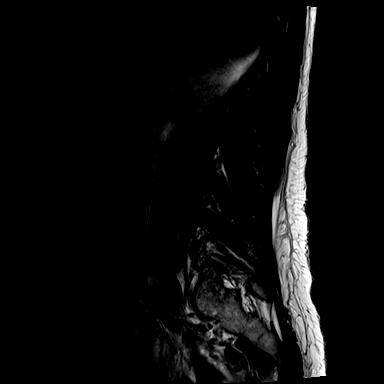
[im 3/15]
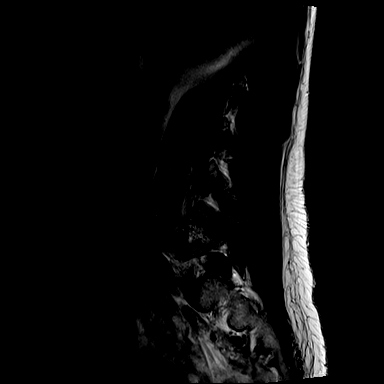
[im 6/15]
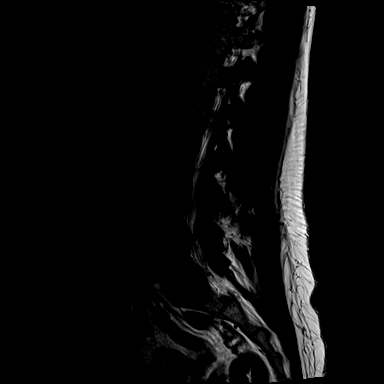
[im 9/15]
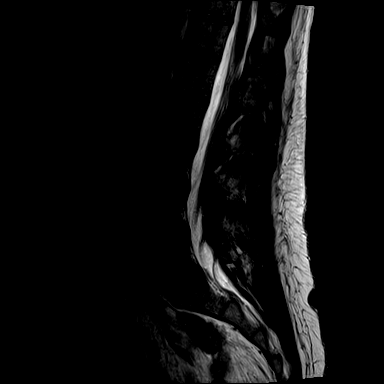
[im 12/15]
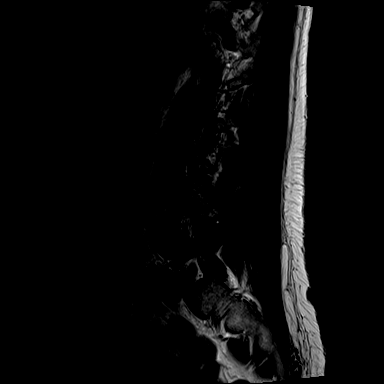
[im 15/15]
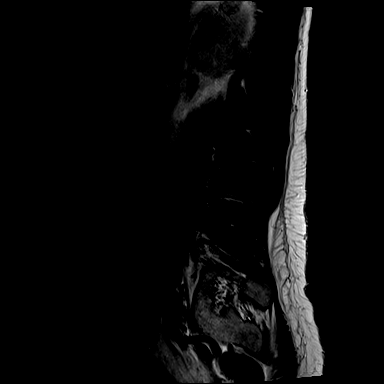

[Series 6: T1 · sagittal · 4.0mm · 0.81mm/px · 7 of 15 slices shown (1 of 2)]
[im 1/15]
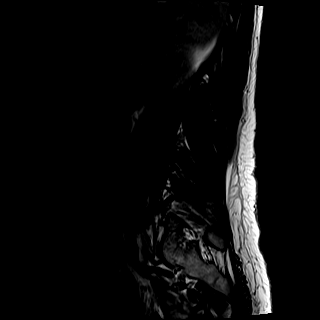
[im 3/15]
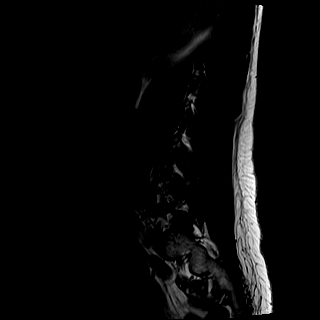
[im 5/15]
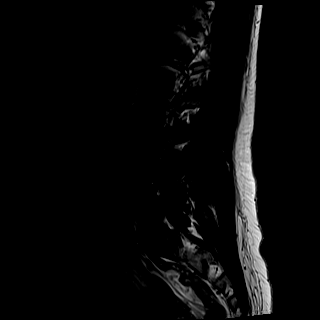
[im 8/15]
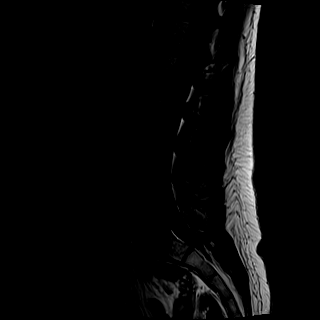
[im 10/15]
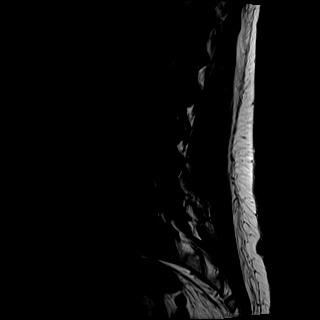
[im 12/15]
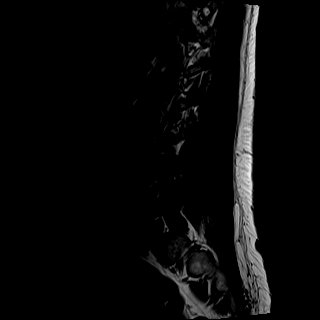
[im 15/15]
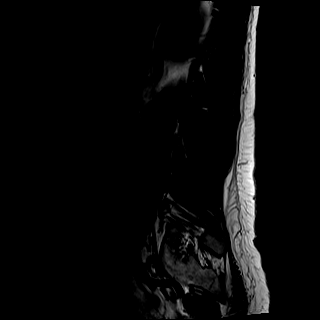

[Series 7: STIR · sagittal · 4.0mm · 0.51mm/px · 2 of 15 slices shown]
[im 1/15]
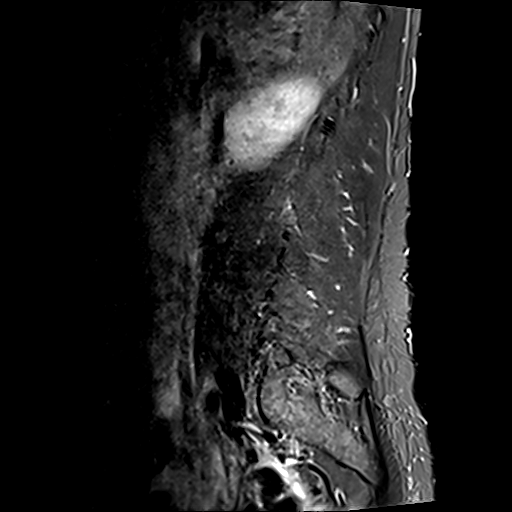
[im 3/15]
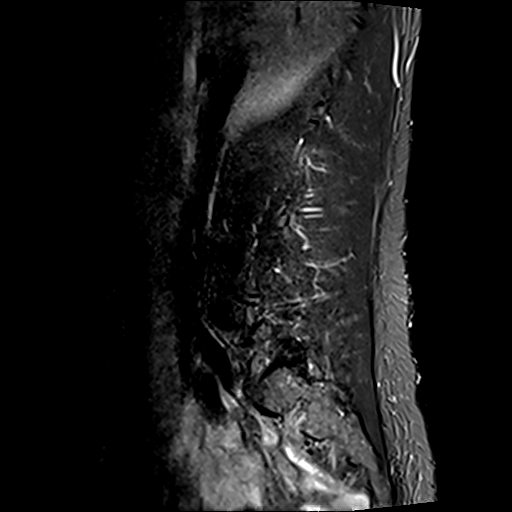

[Series 9: T1 · axial · 4.0mm · 0.35mm/px · z∈[-140,+35]mm · 8 of 29 slices shown (2 of 2)]
[im 1/29]
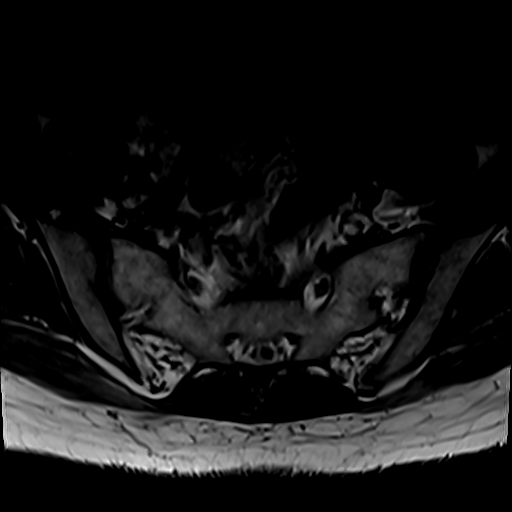
[im 5/29]
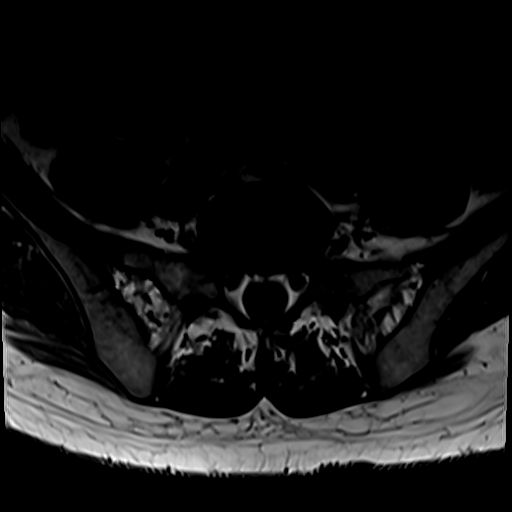
[im 9/29]
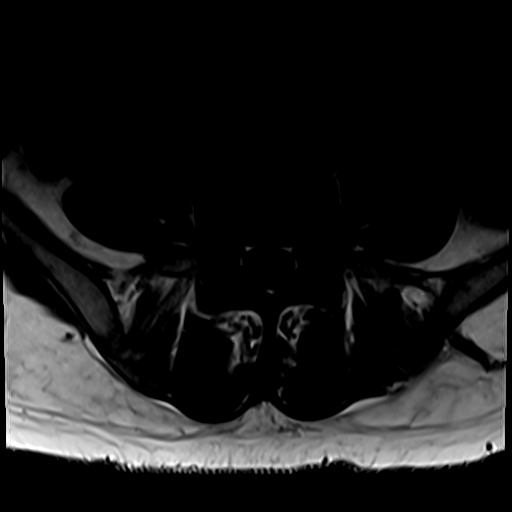
[im 13/29]
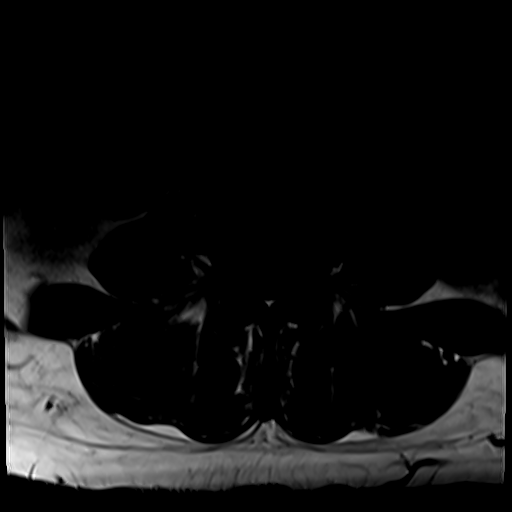
[im 16/29]
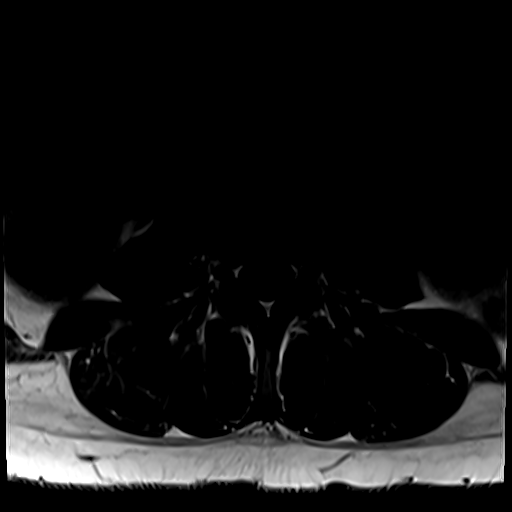
[im 20/29]
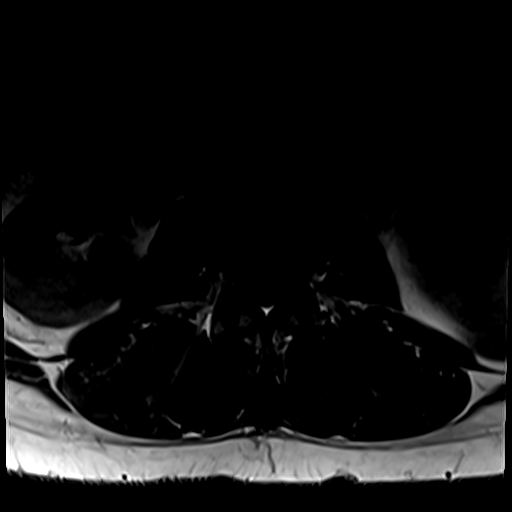
[im 24/29]
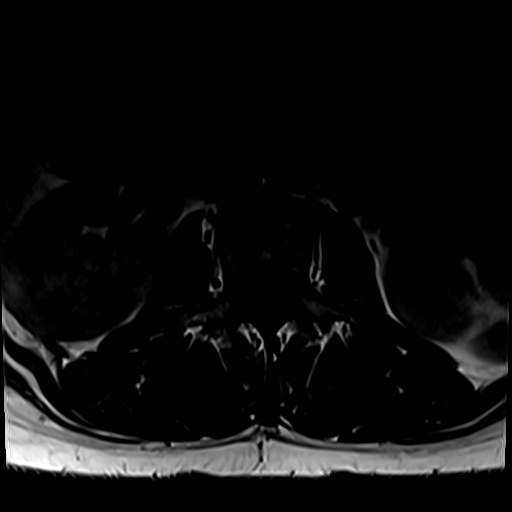
[im 29/29]
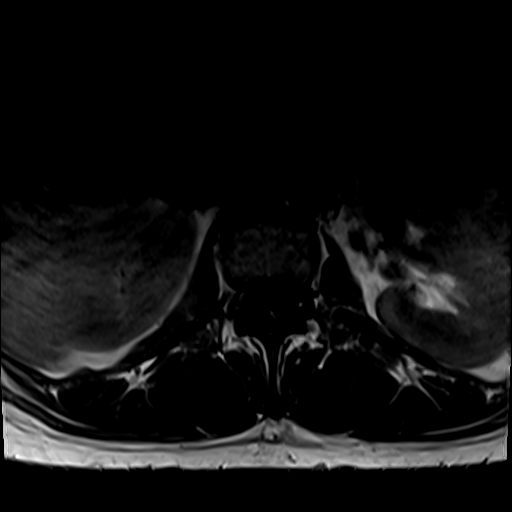

[Series 10: T2 · axial · 4.0mm · 0.70mm/px · z∈[-140,+35]mm · 8 of 29 slices shown (2 of 2)]
[im 1/29]
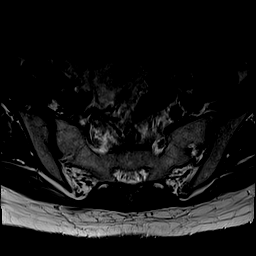
[im 5/29]
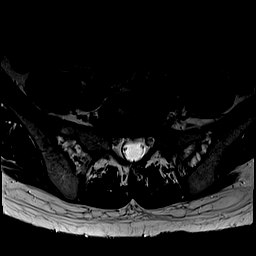
[im 9/29]
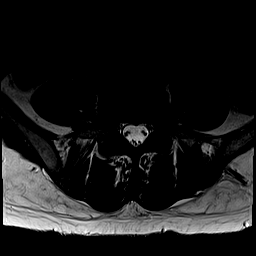
[im 13/29]
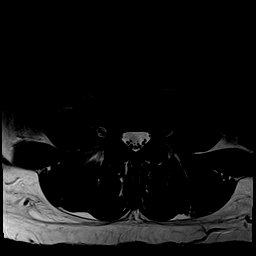
[im 16/29]
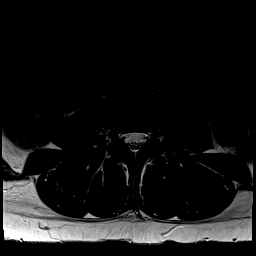
[im 20/29]
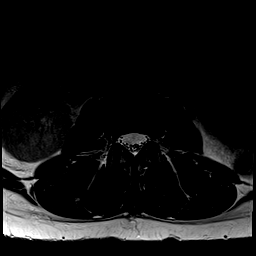
[im 24/29]
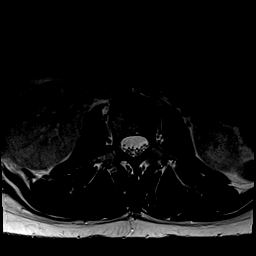
[im 29/29]
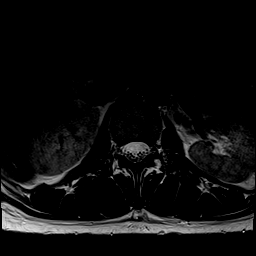

[31 of 48 positions shown; findings below may reference images not displayed]

FINDINGS: Segmentation:  Standard.

Alignment:  Physiologic.

Vertebrae:  No fracture, evidence of discitis, or bone lesion.

Conus medullaris and cauda equina: Conus extends to the T12 level.
Conus and cauda equina appear normal.

Paraspinal and other soft tissues: No acute paraspinal abnormality.

Disc levels:

Disc spaces: Disc spaces are maintained.

T12-L1: No significant disc bulge. No neural foraminal stenosis. No
central canal stenosis.

L1-L2: No significant disc bulge. No neural foraminal stenosis. No
central canal stenosis.

L2-L3: No significant disc bulge. No neural foraminal stenosis. No
central canal stenosis.

L3-L4: No significant disc bulge. No neural foraminal stenosis. No
central canal stenosis.

L4-L5: No significant disc bulge. No neural foraminal stenosis. No
central canal stenosis. Mild bilateral facet arthropathy.

L5-S1: No significant disc bulge. No neural foraminal stenosis. No
central canal stenosis. Mild bilateral facet arthropathy.
IMPRESSION: 1.  No acute osseous injury of the lumbar spine.
2. No significant lumbar spine disc protrusion, foraminal stenosis
or central canal stenosis. Mild bilateral facet arthropathy at L4-5
and L5-S1.

## 2021-05-13 MED ORDER — SILDENAFIL CITRATE 20 MG PO TABS: 20.0000 mg | ORAL_TABLET | Freq: Every day | ORAL | 0 refills | Status: DC

## 2021-05-13 NOTE — Patient Instructions (Signed)
Sildenafil Tablets (Pulmonary Hypertension) What is this medication? SILDENAFIL (sil DEN a fil) treats pulmonary arterial hypertension (PAH), a condition that causes high blood pressure in the lungs. It works by relaxing your blood vessels and lowering the blood pressure in your lungs, which makes it easier for your heart to pump blood to the rest of your body. It can alsohelp you breathe easier and be more active. This medicine may be used for other purposes; ask your health care provider orpharmacist if you have questions. COMMON BRAND NAME(S): Revatio What should I tell my care team before I take this medication? They need to know if you have any of these conditions: Anatomical deformation of the penis, Peyronie's disease, or history of priapism (painful and prolonged erection) Bleeding disorders Eye disease, vision problems Heart disease High or low blood pressure History of blood diseases, like sickle cell anemia or leukemia Kidney disease Liver disease Pulmonary veno-occlusive disease (PVOD) Stomach ulcer An unusual or allergic reaction to sildenafil, other medications, foods, dyes, or preservatives Pregnant or trying to get pregnant Breast-feeding How should I use this medication? Take this medication by mouth with a glass of water. Follow the directions on the prescription label. You can take it with or without food. If it upsets your stomach, take it with food. Take your doses at regular intervals about 4 to 6 hours apart. Do not take it more often than directed. Do not stop taking excepton your care team's advice. Talk to your care team regarding the use of this medication in children. Thismedication is not approved for use in children. Overdosage: If you think you have taken too much of this medicine contact apoison control center or emergency room at once. NOTE: This medicine is only for you. Do not share this medicine with others. What if I miss a dose? If you miss a dose, take  it as soon as you can. If it is almost time for yournext dose, take only that dose. Do not take double or extra doses. What may interact with this medication? Do not take this medication with any of the following: Cisapride Cobicistat Nitrates like amyl nitrite, isosorbide dinitrate, isosorbide mononitrate, nitroglycerin Riociguat Telaprevir This medication may also interact with the following: Antiviral medications for HIV or AIDS Bosentan Certain medications for benign prostatic hyperplasia (BPH) Certain medications for blood pressure Certain medications for fungal infections like ketoconazole and itraconazole Cimetidine Erythromycin Rifampin This list may not describe all possible interactions. Give your health care provider a list of all the medicines, herbs, non-prescription drugs, or dietary supplements you use. Also tell them if you smoke, drink alcohol, or use illegaldrugs. Some items may interact with your medicine. What should I watch for while using this medication? Tell your care team if your symptoms do not start to get better or if they getworse. Tell your care team right away if you have any change in your eyesight orhearing. You may get dizzy. Do not drive, use machinery, or do anything that needs mental alertness until you know how this medication affects you. Do not stand or sit up quickly, especially if you are an older patient. This reduces the risk of dizzy or fainting spells. Avoid alcoholic drinks; they can make youmore dizzy. What side effects may I notice from receiving this medication? Side effects that you should report to your care team as soon as possible: Allergic reactions-skin rash, itching, hives, swelling of the face, lips, tongue, or throat Hearing loss or ringing in ears Heart attack-pain or  tightness in the chest, shoulders, arms, or jaw, nausea, shortness of breath, cold or clammy skin, feeling faint or lightheaded Heart rhythm changes-fast or  irregular heartbeat, dizziness, feeling faint or lightheaded, chest pain, trouble breathing Low blood pressure-dizziness, feeling faint or lightheaded, blurry vision New or worsening shortness of breath Prolonged or painful erection Stroke-sudden numbness or weakness of the face, arm, or leg, trouble speaking, confusion, trouble walking, loss of balance or coordination, dizziness, severe headache, change in vision Sudden vision loss in one or both eyes Side effects that usually do not require medical attention (report to your careteam if they continue or are bothersome): Facial flushing or redness Headache Nosebleed Runny or stuffy nose Trouble sleeping Upset stomach This list may not describe all possible side effects. Call your doctor for medical advice about side effects. You may report side effects to FDA at1-800-FDA-1088. Where should I keep my medication? Keep out of reach of children and pets. Store at room temperature between 15 and 30 degrees C (59 and 86 degrees F).Throw away any unused medication after the expiration date.

## 2021-05-16 DIAGNOSIS — M545 Low back pain, unspecified: Secondary | ICD-10-CM | POA: Insufficient documentation

## 2021-06-10 ENCOUNTER — Ambulatory Visit: Payer: No Typology Code available for payment source | Admitting: Internal Medicine

## 2021-06-10 NOTE — Progress Notes (Deleted)
Office Visit Note  Patient: [name]             Date of Birth: 07/20/1981           MRN: [id number]             PCP: [facility] Referring: [facility]* Visit Date: 06/10/2021   Subjective:  No chief complaint on file.   History of Present Illness: [name] is a 40 y.o. female here for follow up ***     No Rheumatology ROS completed.   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   IUD check up 06/27/2014   Chronic pain 04/04/2014   Chronic anxiety 04/04/2014   Hypertension 04/04/2014   Menorrhagia with regular cycle 03/21/2014   Dyspareunia 03/21/2014   Nausea and vomiting 01/26/2014   Impingement syndrome of shoulder region 12/21/2012   Pain in joint, shoulder region 12/21/2012   Elbow pain 07/02/2011   Acromioclavicular (joint) (ligament) sprain 06/10/2011   Pain in joint, upper arm 06/10/2011   Muscle weakness (generalized) 06/10/2011   Tennis elbow 04/23/2011   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   CELLULITIS, HAND 12/09/2010   NEOPLASMS UNSPEC NATURE BONE SOFT TISSUE&SKIN 11/18/2010    Past Medical History:  Diagnosis Date   Acid reflux    Anxiety    Arthritis    Environmental allergies    Hypertension     Family History  Problem Relation Age of Onset   Mental illness Mother    Diabetes Mother    Obesity Mother    Heart disease Father    Obesity Sister    Lupus Paternal Aunt    Cancer Maternal Grandfather        GASTRIC   Lupus Paternal Grandmother    Diabetes Son        boarderline   Arthritis Other    Cancer Other    Diabetes Other    Past Surgical History:  Procedure Laterality Date   arm surgery Left     plate in arm   BILATERAL SALPINGECTOMY     BONE BIOPSY Right    thumb   BRAIN SURGERY     evacuation of hematoma   CESAREAN SECTION     x2   CHOLECYSTECTOMY     ECTOPIC PREGNANCY SURGERY     x2   ENDOMETRIAL ABLATION  01/18/2018   Procedure: ENDOMETRIAL ABLATION WITH MINERVA;  Surgeon: Jonnie Kind, MD;  Location: [facility];  Service: Gynecology;;   ESOPHAGOGASTRODUODENOSCOPY (EGD) WITH PROPOFOL N/A 01/30/2014   Procedure: ESOPHAGOGASTRODUODENOSCOPY (EGD) WITH PROPOFOL;  Surgeon: Danie Binder, MD;  Location: [facility];  Service: Endoscopy;  Laterality: N/A;   HYSTEROSCOPY WITH D & C N/A 08/13/2015   Procedure: DILATATION AND CURETTAGE /HYSTEROSCOPY;  Surgeon: Jonnie Kind, MD;  Location: [facility];  Service: Gynecology;  Laterality: N/A;   HYSTEROSCOPY WITH D & C  01/18/2018   Procedure: CERVICAL DILATATION /HYSTEROSCOPY;  Surgeon: Jonnie Kind, MD;  Location: [facility];  Service: Gynecology;;   LEG SURGERY Left    femur rodding and removal of part of left femur   otif ankle Right    Social History   Social History Narrative  Not on file    There is no immunization history on file for this patient.   Objective: Vital Signs: There were no vitals taken for this visit.   Physical Exam   Musculoskeletal Exam: ***  CDAI Exam: CDAI Score: -- Patient Global: --; Provider Global: -- Swollen: --; Tender: -- Joint Exam 06/10/2021   No joint exam has been documented for this visit   There is currently no information documented on the homunculus. Go to the Rheumatology activity and complete the homunculus joint exam.  Investigation: No additional findings.  Imaging: MR LUMBAR SPINE WO CONTRAST  Result Date: 05/13/2021 CLINICAL DATA:  Status post fall down the stairs. EXAM: MRI LUMBAR SPINE WITHOUT CONTRAST TECHNIQUE: Multiplanar, multisequence MR imaging of the lumbar spine was performed. No intravenous contrast was administered. COMPARISON:  None. FINDINGS: Segmentation:   Standard. Alignment:  Physiologic. Vertebrae:  No fracture, evidence of discitis, or bone lesion. Conus medullaris and cauda equina: Conus extends to the T12 level. Conus and cauda equina appear normal. Paraspinal and other soft tissues: No acute paraspinal abnormality. Disc levels: Disc spaces: Disc spaces are maintained. T12-L1: No significant disc bulge. No neural foraminal stenosis. No central canal stenosis. L1-L2: No significant disc bulge. No neural foraminal stenosis. No central canal stenosis. L2-L3: No significant disc bulge. No neural foraminal stenosis. No central canal stenosis. L3-L4: No significant disc bulge. No neural foraminal stenosis. No central canal stenosis. L4-L5: No significant disc bulge. No neural foraminal stenosis. No central canal stenosis. Mild bilateral facet arthropathy. L5-S1: No significant disc bulge. No neural foraminal stenosis. No central canal stenosis. Mild bilateral facet arthropathy. IMPRESSION: 1.  No acute osseous injury of the lumbar spine. 2. No significant lumbar spine disc protrusion, foraminal stenosis or central canal stenosis. Mild bilateral facet arthropathy at L4-5 and L5-S1. Electronically Signed   By: Kathreen Devoid   On: 05/13/2021 09:22    Recent Labs: Lab Results  Component Value Date   WBC 7.4 01/17/2018   HGB 12.9 01/17/2018   PLT 295 01/17/2018   NA 138 01/17/2018   K 4.1 01/17/2018   CL 103 01/17/2018   CO2 23 01/17/2018   GLUCOSE 105 (H) 01/17/2018   BUN 11 01/17/2018   CREATININE 0.73 01/17/2018   BILITOT 0.8 01/17/2018   ALKPHOS 40 01/17/2018   AST 20 01/17/2018   ALT 14 01/17/2018   PROT 6.9 04/22/2021   ALBUMIN 4.3 01/17/2018   CALCIUM 9.7 01/17/2018   GFRAA >60 01/17/2018    Speciality Comments: No specialty comments available.  Procedures:  No procedures performed Allergies: Demerol and Orange fruit [citrus]   Assessment / Plan:     Visit Diagnoses: No diagnosis found.  ***  Orders: No orders of the defined types  were placed in this encounter.  No orders of the defined types were placed in this encounter.    Follow-Up Instructions: No follow-ups on file.   Collier Salina, MD  Note - This record has been created using [employer].  Chart creation errors have been sought, but may not always  have been located. Such creation errors do not reflect on  the standard of medical care.

## 2021-06-17 NOTE — Progress Notes (Deleted)
Office Visit Note  Patient: [name]             Date of Birth: June 13, 1981           MRN: [id number]             PCP: [facility] Referring: [facility]* Visit Date: 06/18/2021   Subjective:  No chief complaint on file.   History of Present Illness: [name] is a 40 y.o. female here for follow up after starting trial of sildenafil for her hand pain and discoloration suggestive for atypical raynaud's symptoms. ***   05/13/21 [name] is a 40 y.o. female here for follow up for multiple chronic joint pains with positive rheumatoid factor.  X-rays and laboratory testing at last visit redemonstrated positive rheumatoid factor but negative for other autoantibodies and negative for elevated inflammatory markers.  X-ray changes reviewed were degenerative without any erosive inflammatory disease evidence.  Since her last visit she continues describing hand pain and stiffness swelling as well as bilateral knee and foot pain.  The worst finger pain she describes is associated with violaceous or erythematous discoloration from the MCP joints distally on her bilateral hands often with extremely cold and tingling sensation.   Previous HPI: 04/22/21 [name] is a 40 y.o. female here for evaluation of multiple joint pains especially involving bilateral hands knees and feet with positive rheumatoid factor.  She has very longstanding history for chronic joint pains and previous surgery for the head and back but these peripheral area involvements are worsening significantly during the past year.  She thinks especially around the past 3 to 6 months have been significantly increased symptoms.  She notices intermittent joint swelling and morning stiffness but has pain every day.  This is increased by the end of the day to the point she is very limited in her activities after work.  The joint swelling does seem to improve during the day also  improved by running hot water over her hands.  She is on chronic low-dose opioid medication for her back pain and chronic pain syndrome with no recent change.  She has tried topical Voltaren with not very noticeable improvement. She has had numerous surgeries including internal fixation for left forearm and left femur. She has had ganglion cyst excision no other hand or wrist surgeries.  Her back pain is also currently in exacerbation after tripping over her cat on the stairs since 2 weeks ago.   Besides joint pain she describes erythematous rash across her chest and bilateral arms that she thinks worsens being out in the sun.  This does not involve the face denies any oral ulcers or lesions.  She notices discoloration and numbness in the tips of her fingers and sometimes toes worsens with cold exposure.   Labs reviewed ANA neg RF 21.8 ESR 2   No Rheumatology ROS completed.   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   IUD check up 06/27/2014   Chronic pain 04/04/2014   Chronic anxiety 04/04/2014   Hypertension 04/04/2014   Menorrhagia with regular cycle 03/21/2014   Dyspareunia 03/21/2014   Nausea and vomiting 01/26/2014   Impingement syndrome  of shoulder region 12/21/2012   Pain in joint, shoulder region 12/21/2012   Elbow pain 07/02/2011   Acromioclavicular (joint) (ligament) sprain 06/10/2011   Pain in joint, upper arm 06/10/2011   Muscle weakness (generalized) 06/10/2011   Tennis elbow 04/23/2011   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   CELLULITIS, HAND 12/09/2010   NEOPLASMS UNSPEC NATURE BONE SOFT TISSUE&SKIN 11/18/2010    Past Medical History:  Diagnosis Date   Acid reflux    Anxiety     Arthritis    Environmental allergies    Hypertension     Family History  Problem Relation Age of Onset   Mental illness Mother    Diabetes Mother    Obesity Mother    Heart disease Father    Obesity Sister    Lupus Paternal Aunt    Cancer Maternal Grandfather        GASTRIC   Lupus Paternal Grandmother    Diabetes Son        boarderline   Arthritis Other    Cancer Other    Diabetes Other    Past Surgical History:  Procedure Laterality Date   arm surgery Left    plate in arm   BILATERAL SALPINGECTOMY     BONE BIOPSY Right    thumb   BRAIN SURGERY     evacuation of hematoma   CESAREAN SECTION     x2   CHOLECYSTECTOMY     ECTOPIC PREGNANCY SURGERY     x2   ENDOMETRIAL ABLATION  01/18/2018   Procedure: ENDOMETRIAL ABLATION WITH MINERVA;  Surgeon: Jonnie Kind, MD;  Location: [facility];  Service: Gynecology;;   ESOPHAGOGASTRODUODENOSCOPY (EGD) WITH PROPOFOL N/A 01/30/2014   Procedure: ESOPHAGOGASTRODUODENOSCOPY (EGD) WITH PROPOFOL;  Surgeon: Danie Binder, MD;  Location: [facility];  Service: Endoscopy;  Laterality: N/A;   HYSTEROSCOPY WITH D & C N/A 08/13/2015   Procedure: DILATATION AND CURETTAGE /HYSTEROSCOPY;  Surgeon: Jonnie Kind, MD;  Location: [facility];  Service: Gynecology;  Laterality: N/A;   HYSTEROSCOPY WITH D & C  01/18/2018   Procedure: CERVICAL DILATATION /HYSTEROSCOPY;  Surgeon: Jonnie Kind, MD;  Location: [facility];  Service: Gynecology;;   LEG SURGERY Left    femur rodding and removal of part of left femur   otif ankle Right    Social History   Social History Narrative   Not on file    There is no immunization history on file for this patient.   Objective: Vital Signs: There were no vitals taken for this visit.   Physical Exam   Musculoskeletal Exam: ***  CDAI Exam: CDAI Score: -- Patient Global: --; Provider Global: -- Swollen: --; Tender: -- Joint Exam 06/18/2021   No joint exam has been documented for this visit   There is currently  no information documented on the homunculus. Go to the Rheumatology activity and complete the homunculus joint exam.  Investigation: No additional findings.  Imaging: No results found.  Recent Labs: Lab Results  Component Value Date   WBC 7.4 01/17/2018   HGB 12.9 01/17/2018   PLT 295 01/17/2018   NA 138 01/17/2018   K 4.1 01/17/2018   CL 103 01/17/2018   CO2 23 01/17/2018   GLUCOSE 105 (H) 01/17/2018   BUN 11 01/17/2018   CREATININE 0.73 01/17/2018   BILITOT 0.8 01/17/2018   ALKPHOS 40 01/17/2018   AST 20 01/17/2018   ALT 14 01/17/2018   PROT 6.9 04/22/2021   ALBUMIN 4.3 01/17/2018   CALCIUM 9.7 01/17/2018  GFRAA >60 01/17/2018    Speciality Comments: No specialty comments available.  Procedures:  No procedures performed Allergies: Demerol and Orange fruit [citrus]   Assessment / Plan:     Visit Diagnoses: No diagnosis found.  ***  Orders: No orders of the defined types were placed in this encounter.  No orders of the defined types were placed in this encounter.    Follow-Up Instructions: No follow-ups on file.   Collier Salina, MD  Note - This record has been created using [employer].  Chart creation errors have been sought, but may not always  have been located. Such creation errors do not reflect on  the standard of medical care.

## 2021-06-18 ENCOUNTER — Ambulatory Visit: Payer: No Typology Code available for payment source | Admitting: Internal Medicine

## 2021-07-07 ENCOUNTER — Other Ambulatory Visit: Payer: Self-pay | Admitting: Chiropractic Medicine

## 2021-07-07 DIAGNOSIS — M47896 Other spondylosis, lumbar region: Secondary | ICD-10-CM

## 2021-08-05 ENCOUNTER — Other Ambulatory Visit (HOSPITAL_COMMUNITY): Payer: Self-pay | Admitting: Internal Medicine

## 2021-08-05 DIAGNOSIS — Z1231 Encounter for screening mammogram for malignant neoplasm of breast: Secondary | ICD-10-CM

## 2021-08-18 ENCOUNTER — Other Ambulatory Visit: Payer: Self-pay

## 2021-08-18 ENCOUNTER — Ambulatory Visit (HOSPITAL_COMMUNITY)
Admission: RE | Admit: 2021-08-18 | Discharge: 2021-08-18 | Disposition: A | Discharge status: Home / Self Care | Payer: No Typology Code available for payment source | Source: Ambulatory Visit | Attending: Internal Medicine | Admitting: Internal Medicine

## 2021-08-18 DIAGNOSIS — Z1231 Encounter for screening mammogram for malignant neoplasm of breast: Secondary | ICD-10-CM | POA: Insufficient documentation

## 2021-08-18 IMAGING — MG MM DIGITAL SCREENING BILAT W/ TOMO AND CAD
8 series · 9 of 24 positions shown · non-contrast
Comparison: None.

CLINICAL DATA: Screening.

EXAM:
DIGITAL SCREENING BILATERAL MAMMOGRAM WITH TOMOSYNTHESIS AND CAD
TECHNIQUE: Bilateral screening digital craniocaudal and mediolateral oblique
mammograms were obtained. Bilateral screening digital breast
tomosynthesis was performed. The images were evaluated with
computer-aided detection.

[L CC synth-2D]
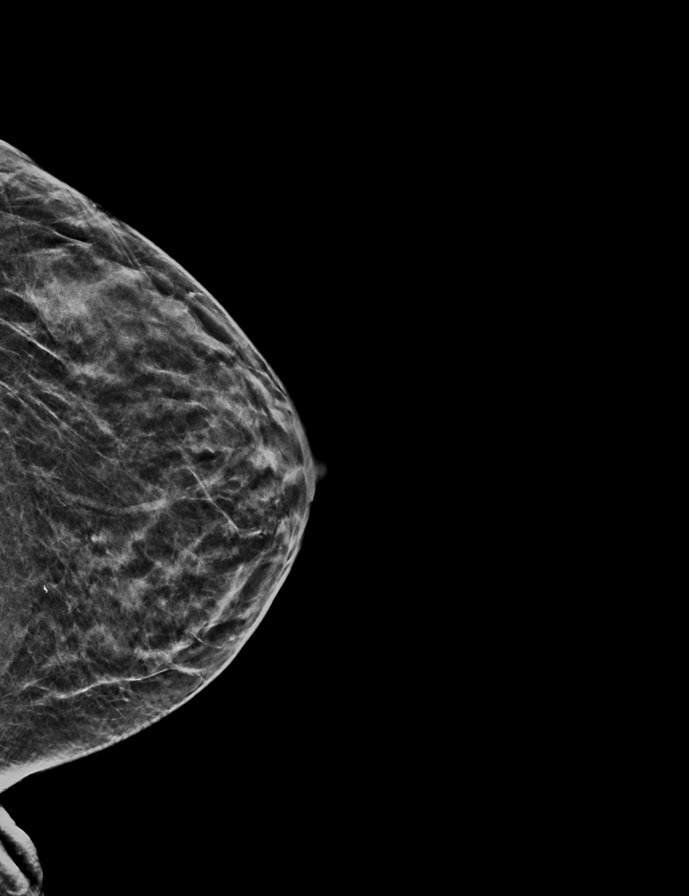

[R CC synth-2D]
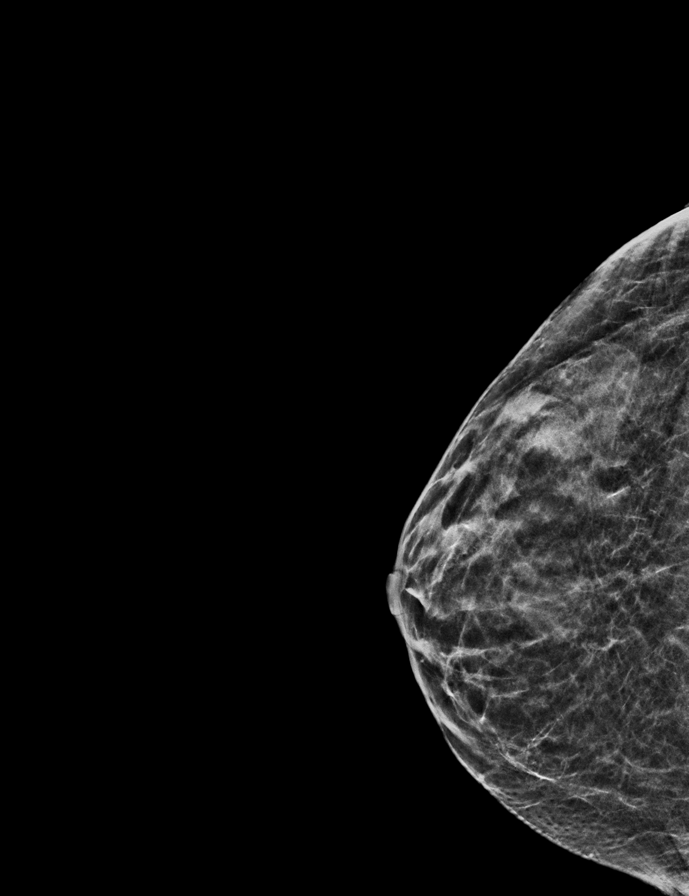

[R MLO synth-2D]
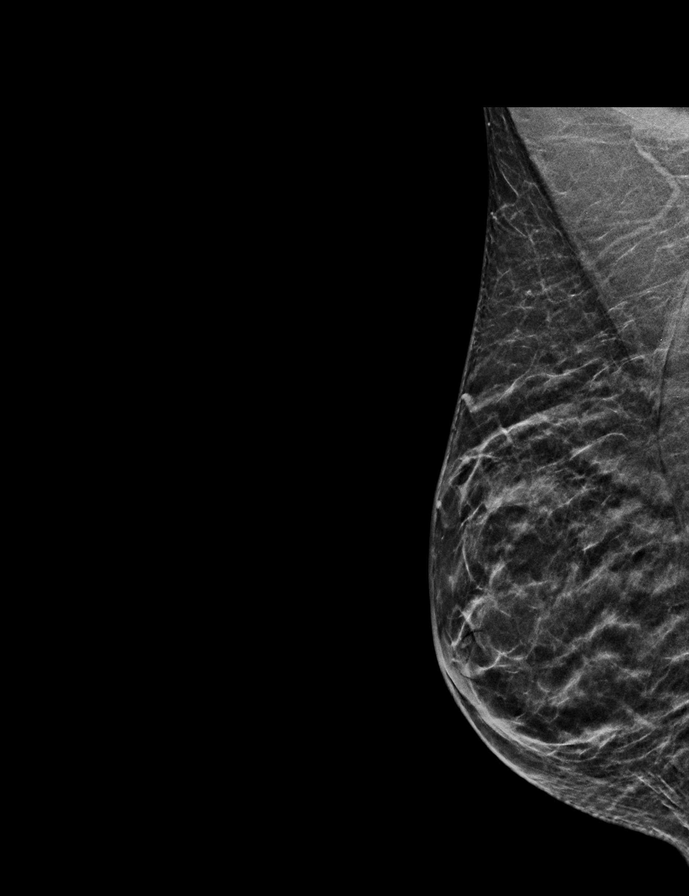

[L MLO synth-2D]
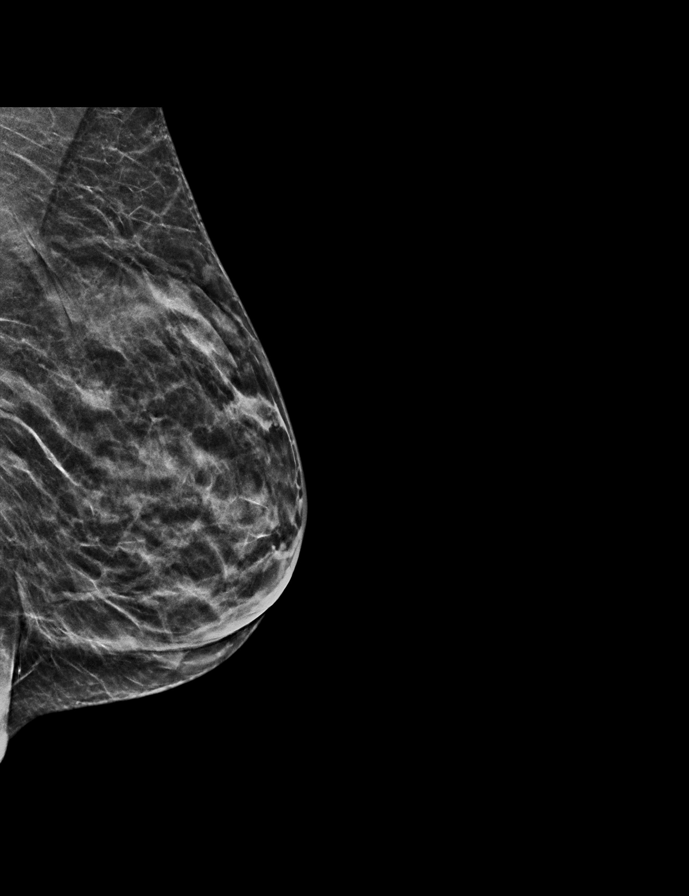

[L MLO tomo · 2 of 41 frames shown]
[frame 14/41]
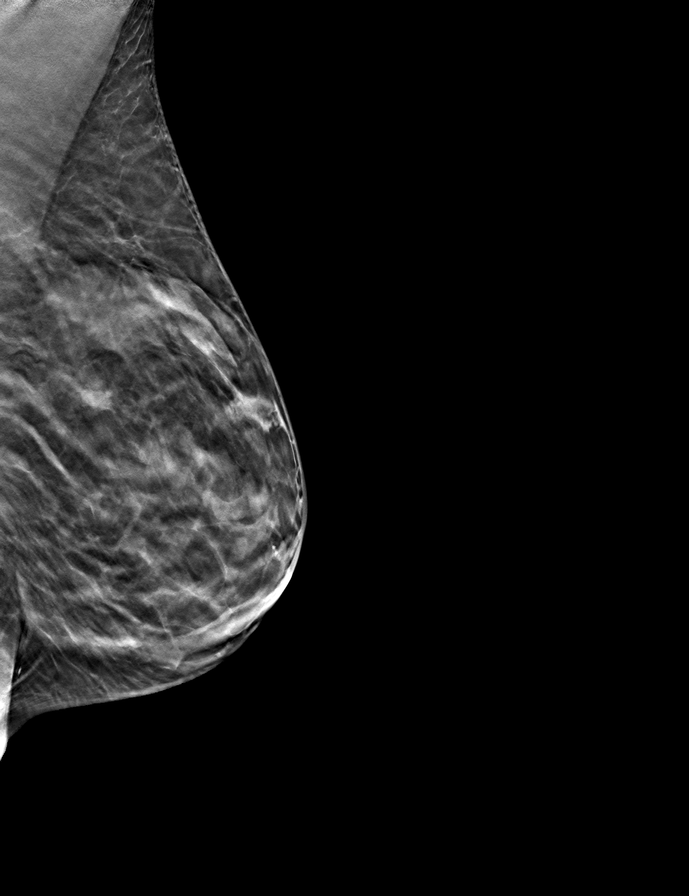
[frame 21/41]
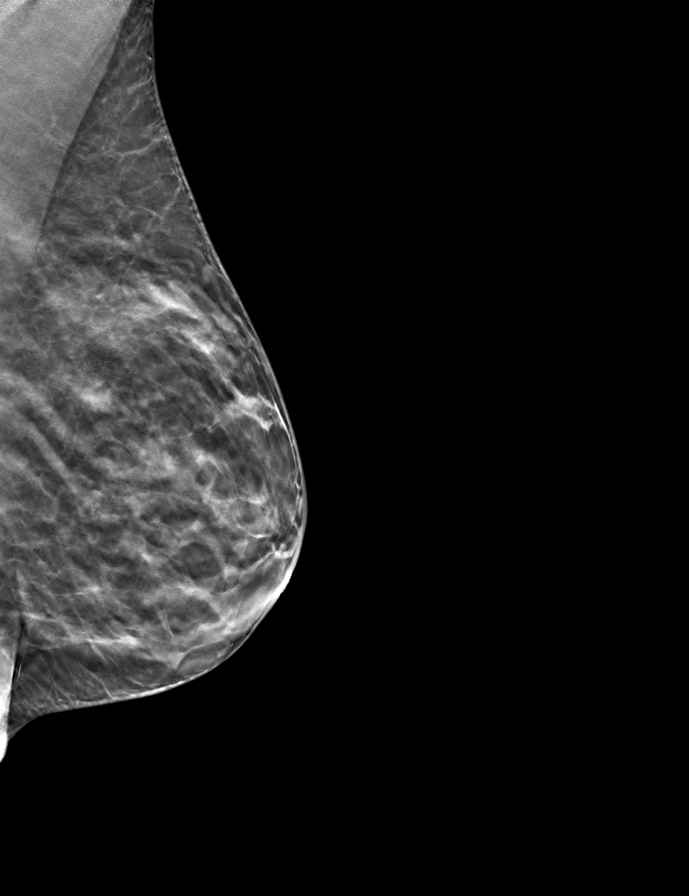

[L CC tomo · tomo slice 20/39.0]
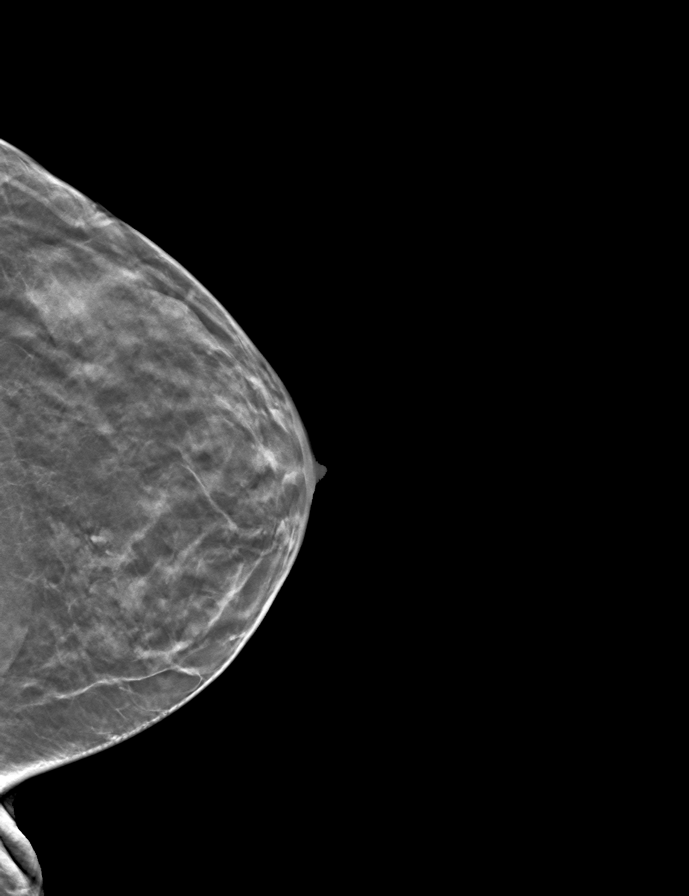

[R MLO tomo · tomo slice 21/41.0]
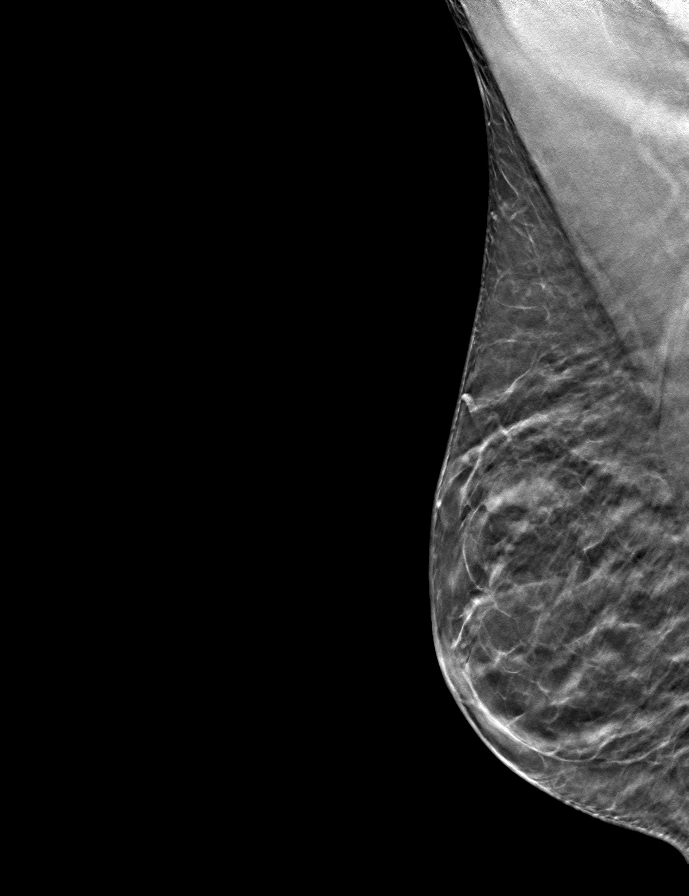

[R CC tomo · tomo slice 21/42.0]
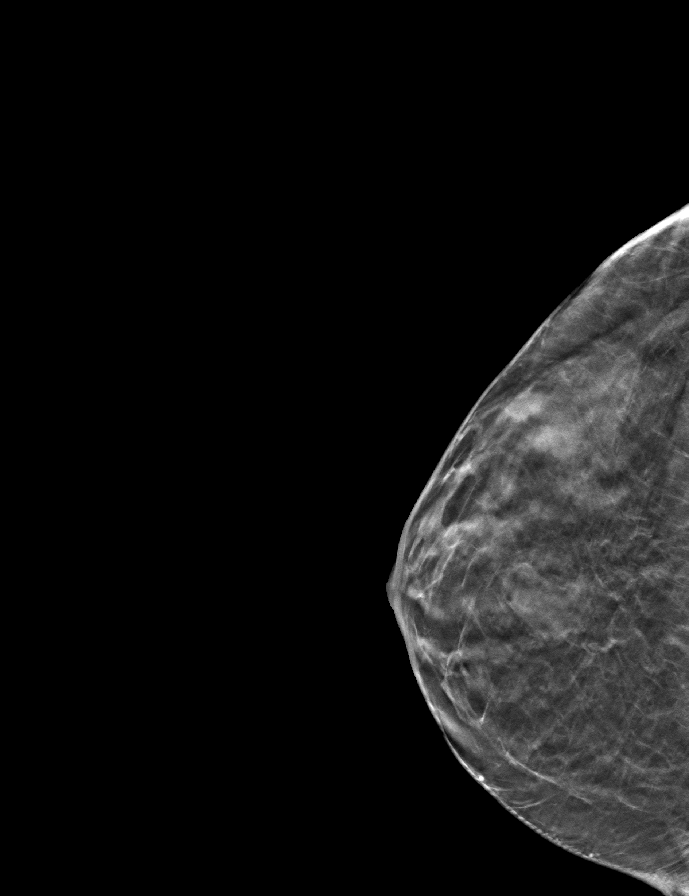

[9 of 24 positions shown; findings below may reference images not displayed]

ACR Breast Density Category c: The breast tissue is heterogeneously
dense, which may obscure small masses
FINDINGS: There are no findings suspicious for malignancy.
IMPRESSION: No mammographic evidence of malignancy. A result letter of this
screening mammogram will be mailed directly to the patient.

RECOMMENDATION:
Screening mammogram in one year. (Code:[6Z])

BI-RADS CATEGORY  1: Negative.

## 2022-01-06 ENCOUNTER — Other Ambulatory Visit (HOSPITAL_COMMUNITY): Payer: Self-pay | Admitting: Internal Medicine

## 2022-01-06 DIAGNOSIS — M542 Cervicalgia: Secondary | ICD-10-CM

## 2022-01-06 DIAGNOSIS — M5412 Radiculopathy, cervical region: Secondary | ICD-10-CM

## 2022-01-23 ENCOUNTER — Ambulatory Visit (HOSPITAL_COMMUNITY)
Admission: RE | Admit: 2022-01-23 | Discharge: 2022-01-23 | Disposition: A | Discharge status: Home / Self Care | Payer: No Typology Code available for payment source | Source: Ambulatory Visit | Attending: Internal Medicine | Admitting: Internal Medicine

## 2022-01-23 DIAGNOSIS — M5412 Radiculopathy, cervical region: Secondary | ICD-10-CM | POA: Insufficient documentation

## 2022-01-23 DIAGNOSIS — M542 Cervicalgia: Secondary | ICD-10-CM | POA: Diagnosis not present

## 2022-01-23 IMAGING — MR MR CERVICAL SPINE W/O CM
5 series · 39 of 48 positions shown · non-contrast
Comparison: None.

CLINICAL DATA: Neck pain, numbness in both arms

EXAM:
MRI CERVICAL SPINE WITHOUT CONTRAST
TECHNIQUE: Multiplanar, multisequence MR imaging of the cervical spine was
performed. No intravenous contrast was administered.

[Series 5: T2 · sagittal · 3.0mm · 0.69mm/px · 6 of 15 slices shown (1 of 2)]
[im 1/15]
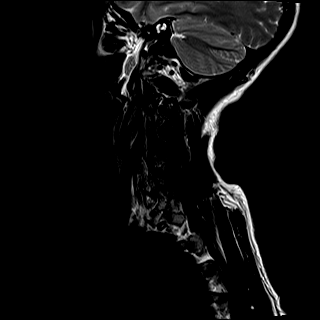
[im 3/15]
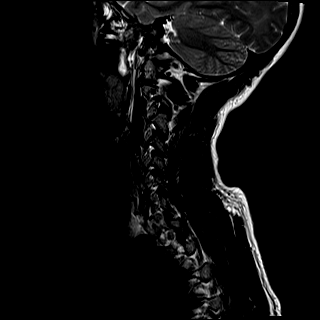
[im 6/15]
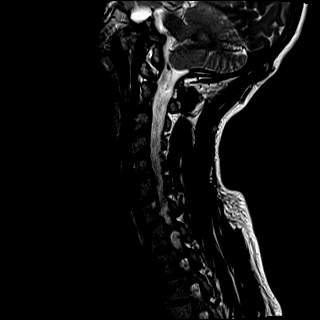
[im 9/15]
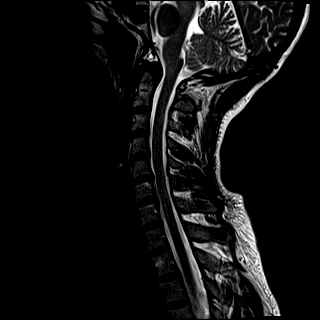
[im 12/15]
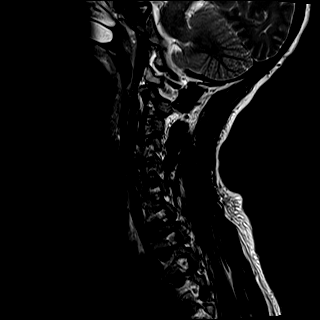
[im 15/15]
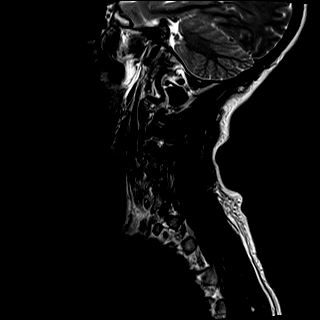

[Series 6: T1 · sagittal · 3.0mm · 0.86mm/px · 6 of 15 slices shown]
[im 1/15]
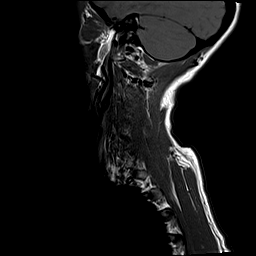
[im 3/15]
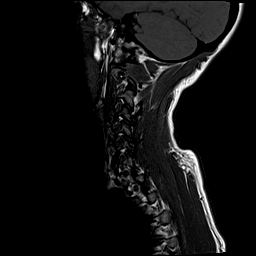
[im 6/15]
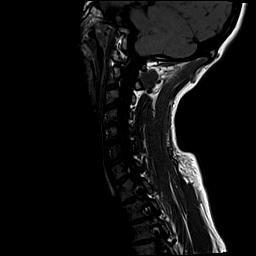
[im 9/15]
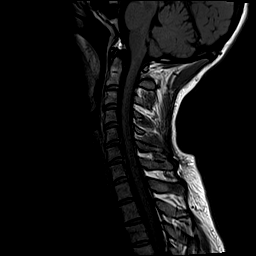
[im 12/15]
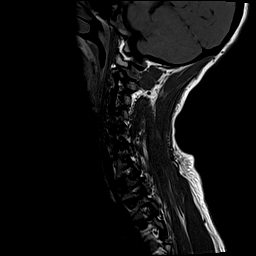
[im 15/15]
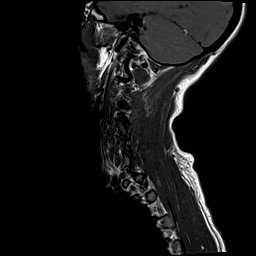

[Series 7: STIR · sagittal · 3.0mm · 0.69mm/px · 6 of 15 slices shown]
[im 1/15]
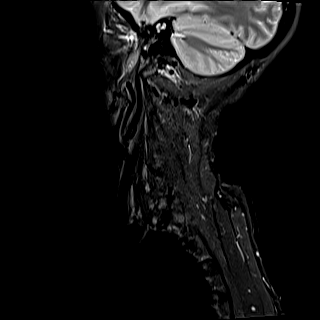
[im 3/15]
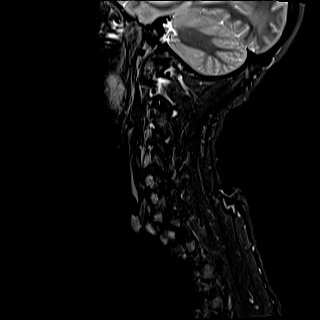
[im 6/15]
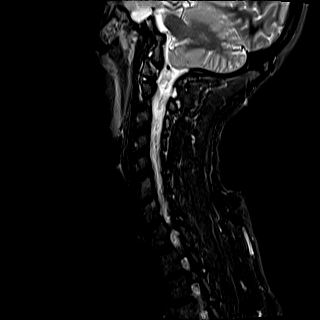
[im 9/15]
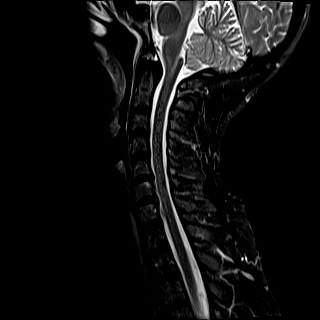
[im 12/15]
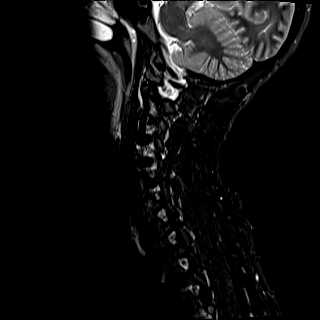
[im 15/15]
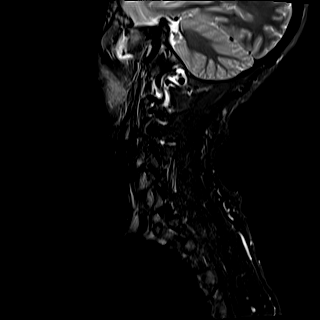

[Series 8: T2 · axial · 3.0mm · 0.70mm/px · z∈[-82,+27]mm · 13 of 35 slices shown (2 of 2)]
[im 1/35]
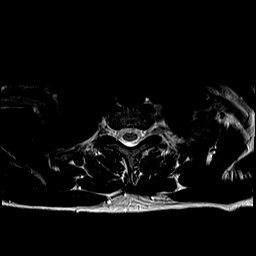
[im 3/35]
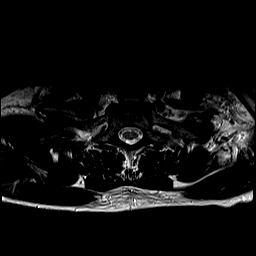
[im 5/35]
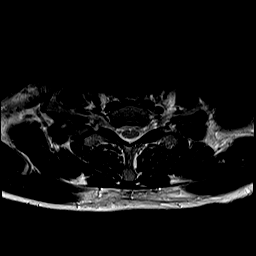
[im 8/35]
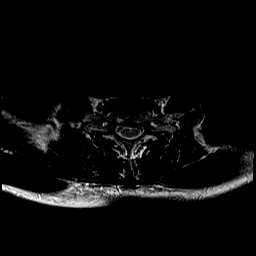
[im 10/35]
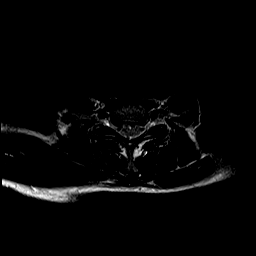
[im 13/35]
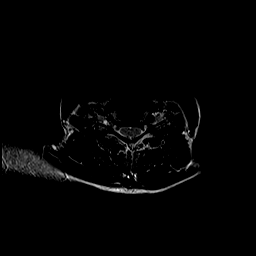
[im 15/35]
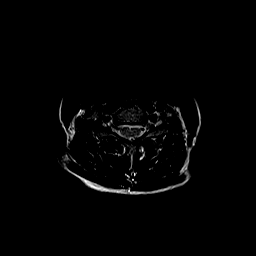
[im 18/35]
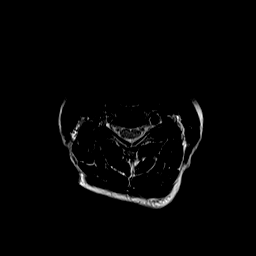
[im 20/35]
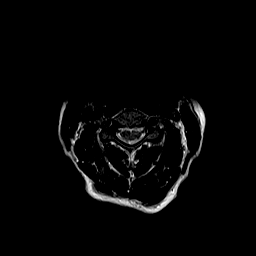
[im 22/35]
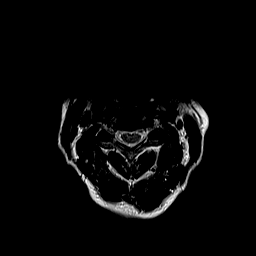
[im 25/35]
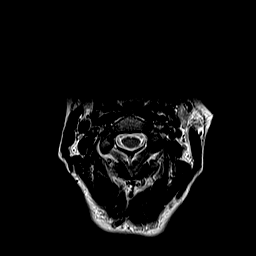
[im 30/35]
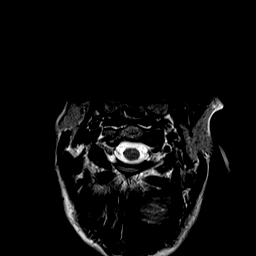
[im 35/35]
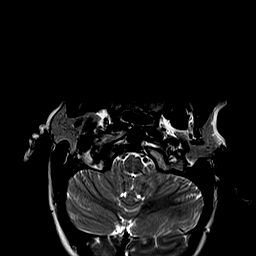

[Series 9: GRE · axial · 3.0mm · 0.35mm/px · z∈[-82,+27]mm · 8 of 35 slices shown]
[im 1/35]
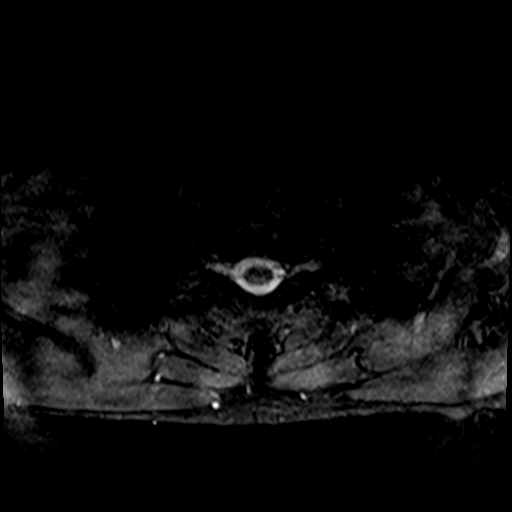
[im 5/35]
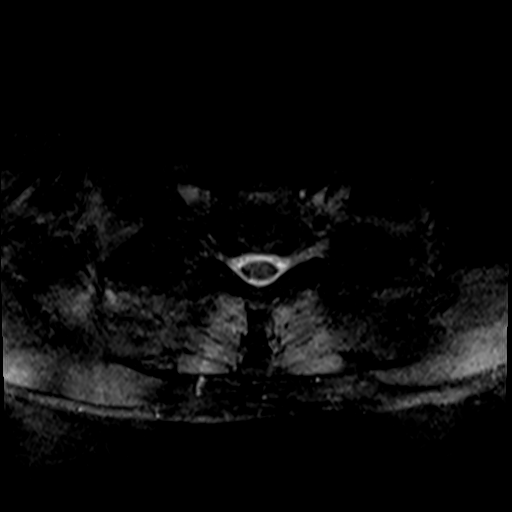
[im 10/35]
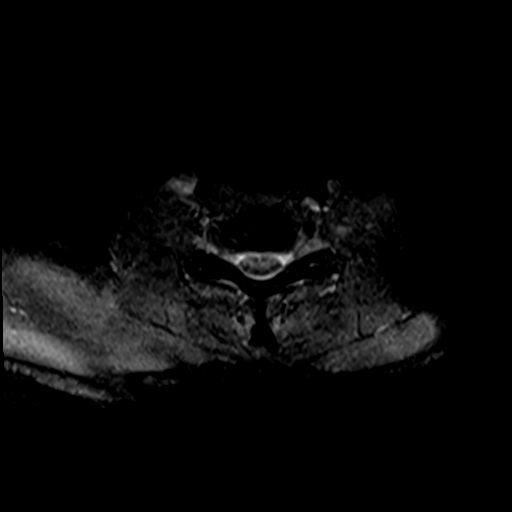
[im 15/35]
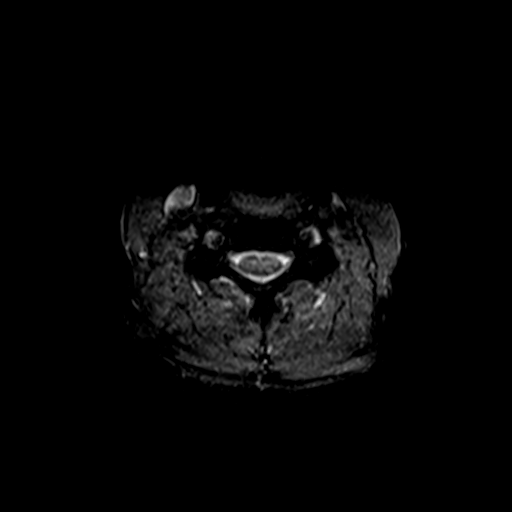
[im 20/35]
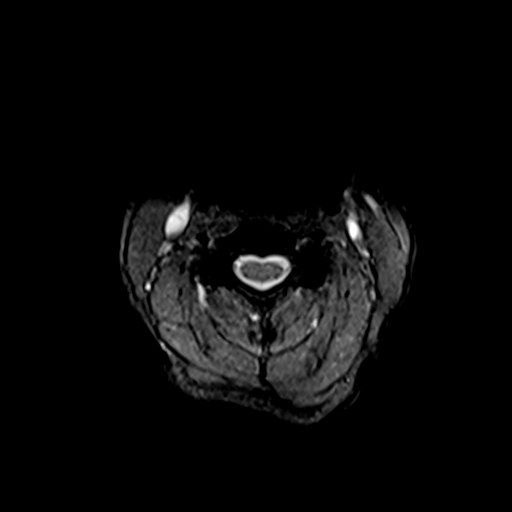
[im 25/35]
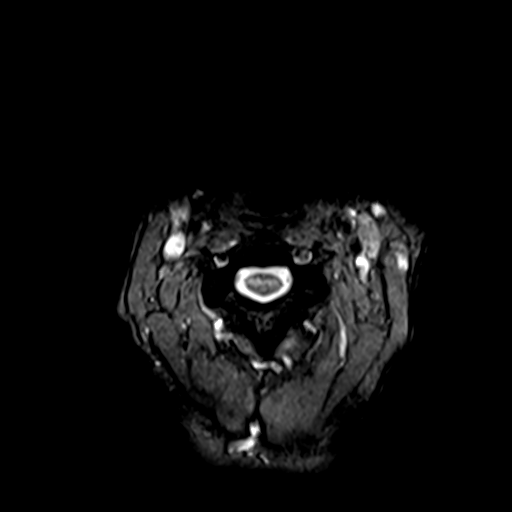
[im 30/35]
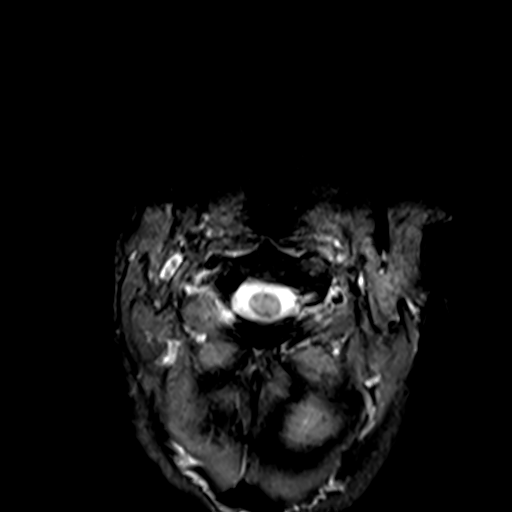
[im 35/35]
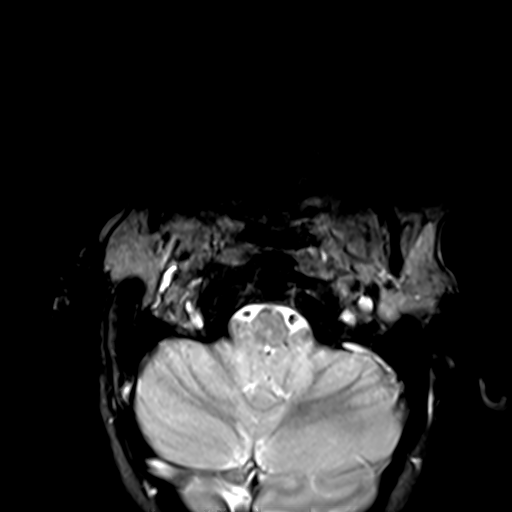

[39 of 48 positions shown; findings below may reference images not displayed]

FINDINGS: Alignment: Normal.

Vertebrae: Vertebral body heights are preserved. A focus of T1 and
T2 hyperintensity in the T2 vertebral body likely reflects an
intraosseous hemangioma. In marrow signal is otherwise normal. There
is no suspicious marrow signal abnormality or marrow edema.

Cord: Normal in signal and morphology.

Posterior Fossa, vertebral arteries, paraspinal tissues: Imaged
posterior fossa is unremarkable. The vertebral artery flow voids are
normal, left dominant. The paraspinal soft tissues are unremarkable.

Disc levels:

The disc heights are overall preserved.

C2-C3: No significant spinal canal or neural foraminal stenosis.

C3-C4: There is a small central protrusion without significant
spinal canal or neural foraminal stenosis.

C4-C5: There is a small central protrusion without significant
spinal canal or neural foraminal stenosis.

C5-C6: There is a mild posterior disc osteophyte complex with mild
uncovertebral ridging and minimal facet arthropathy resulting in
mild left and no significant right neural foraminal stenosis and
mild spinal canal stenosis

C6-C7: There is a mild posterior disc osteophyte complex with mild
uncovertebral ridging and minimal facet arthropathy resulting in
mild right and no significant left neural foraminal stenosis and
mild spinal canal stenosis.

C7-T1: No significant spinal canal or neural foraminal stenosis.
IMPRESSION: Mild degenerative changes at C5-C6 and C6-C7 resulting in mild
spinal canal stenosis at both levels, mild left neural foraminal
stenosis at C5-C6, and mild right neural foraminal stenosis at
C6-C7. No high-grade spinal canal or neural foraminal stenosis or
cord or nerve root compression.

## 2022-08-18 ENCOUNTER — Other Ambulatory Visit (HOSPITAL_COMMUNITY): Payer: Self-pay | Admitting: Internal Medicine

## 2022-08-18 DIAGNOSIS — Z1231 Encounter for screening mammogram for malignant neoplasm of breast: Secondary | ICD-10-CM

## 2022-08-26 ENCOUNTER — Ambulatory Visit (HOSPITAL_COMMUNITY)
Admission: RE | Admit: 2022-08-26 | Discharge: 2022-08-26 | Disposition: A | Discharge status: Home / Self Care | Payer: No Typology Code available for payment source | Source: Ambulatory Visit | Attending: Internal Medicine | Admitting: Internal Medicine

## 2022-08-26 DIAGNOSIS — Z1231 Encounter for screening mammogram for malignant neoplasm of breast: Secondary | ICD-10-CM | POA: Insufficient documentation

## 2022-10-27 DIAGNOSIS — E0590 Thyrotoxicosis, unspecified without thyrotoxic crisis or storm: Secondary | ICD-10-CM | POA: Diagnosis not present

## 2022-11-30 DIAGNOSIS — G894 Chronic pain syndrome: Secondary | ICD-10-CM | POA: Diagnosis not present

## 2022-11-30 DIAGNOSIS — G43101 Migraine with aura, not intractable, with status migrainosus: Secondary | ICD-10-CM | POA: Diagnosis not present

## 2022-11-30 DIAGNOSIS — M5412 Radiculopathy, cervical region: Secondary | ICD-10-CM | POA: Diagnosis not present

## 2022-11-30 DIAGNOSIS — M1991 Primary osteoarthritis, unspecified site: Secondary | ICD-10-CM | POA: Diagnosis not present

## 2022-12-30 DIAGNOSIS — M5412 Radiculopathy, cervical region: Secondary | ICD-10-CM | POA: Diagnosis not present

## 2022-12-30 DIAGNOSIS — G894 Chronic pain syndrome: Secondary | ICD-10-CM | POA: Diagnosis not present

## 2022-12-30 DIAGNOSIS — M1991 Primary osteoarthritis, unspecified site: Secondary | ICD-10-CM | POA: Diagnosis not present

## 2022-12-30 DIAGNOSIS — G43101 Migraine with aura, not intractable, with status migrainosus: Secondary | ICD-10-CM | POA: Diagnosis not present

## 2023-01-07 ENCOUNTER — Other Ambulatory Visit (HOSPITAL_COMMUNITY): Payer: Self-pay | Admitting: Internal Medicine

## 2023-01-07 DIAGNOSIS — M5416 Radiculopathy, lumbar region: Secondary | ICD-10-CM

## 2023-01-08 ENCOUNTER — Other Ambulatory Visit (HOSPITAL_COMMUNITY): Payer: Self-pay | Admitting: Internal Medicine

## 2023-01-08 DIAGNOSIS — S3091XA Unspecified superficial injury of lower back and pelvis, initial encounter: Secondary | ICD-10-CM

## 2023-01-11 ENCOUNTER — Ambulatory Visit (HOSPITAL_COMMUNITY)
Admission: RE | Admit: 2023-01-11 | Discharge: 2023-01-11 | Disposition: A | Discharge status: Home / Self Care | Payer: 59 | Source: Ambulatory Visit | Attending: Internal Medicine | Admitting: Internal Medicine

## 2023-01-11 DIAGNOSIS — R102 Pelvic and perineal pain: Secondary | ICD-10-CM | POA: Diagnosis not present

## 2023-01-11 DIAGNOSIS — S3091XA Unspecified superficial injury of lower back and pelvis, initial encounter: Secondary | ICD-10-CM

## 2023-01-11 DIAGNOSIS — M545 Low back pain, unspecified: Secondary | ICD-10-CM | POA: Diagnosis not present

## 2023-01-11 DIAGNOSIS — M48061 Spinal stenosis, lumbar region without neurogenic claudication: Secondary | ICD-10-CM | POA: Diagnosis not present

## 2023-02-01 ENCOUNTER — Ambulatory Visit (HOSPITAL_COMMUNITY)
Admission: RE | Admit: 2023-02-01 | Discharge: 2023-02-01 | Disposition: A | Discharge status: Home / Self Care | Payer: 59 | Source: Ambulatory Visit | Attending: Internal Medicine | Admitting: Internal Medicine

## 2023-02-01 DIAGNOSIS — M545 Low back pain, unspecified: Secondary | ICD-10-CM | POA: Diagnosis not present

## 2023-02-01 DIAGNOSIS — M5416 Radiculopathy, lumbar region: Secondary | ICD-10-CM | POA: Insufficient documentation

## 2023-02-26 DIAGNOSIS — G894 Chronic pain syndrome: Secondary | ICD-10-CM | POA: Diagnosis not present

## 2023-02-26 DIAGNOSIS — M5416 Radiculopathy, lumbar region: Secondary | ICD-10-CM | POA: Diagnosis not present

## 2023-02-26 DIAGNOSIS — K219 Gastro-esophageal reflux disease without esophagitis: Secondary | ICD-10-CM | POA: Diagnosis not present

## 2023-02-26 DIAGNOSIS — F419 Anxiety disorder, unspecified: Secondary | ICD-10-CM | POA: Diagnosis not present

## 2023-03-08 DIAGNOSIS — M47816 Spondylosis without myelopathy or radiculopathy, lumbar region: Secondary | ICD-10-CM | POA: Diagnosis not present

## 2023-03-08 DIAGNOSIS — M7918 Myalgia, other site: Secondary | ICD-10-CM | POA: Diagnosis not present

## 2023-03-30 DIAGNOSIS — M1991 Primary osteoarthritis, unspecified site: Secondary | ICD-10-CM | POA: Diagnosis not present

## 2023-03-30 DIAGNOSIS — G894 Chronic pain syndrome: Secondary | ICD-10-CM | POA: Diagnosis not present

## 2023-03-30 DIAGNOSIS — F419 Anxiety disorder, unspecified: Secondary | ICD-10-CM | POA: Diagnosis not present

## 2023-03-30 DIAGNOSIS — M5416 Radiculopathy, lumbar region: Secondary | ICD-10-CM | POA: Diagnosis not present

## 2023-04-26 ENCOUNTER — Other Ambulatory Visit (HOSPITAL_COMMUNITY): Payer: Self-pay | Admitting: Internal Medicine

## 2023-04-26 DIAGNOSIS — K529 Noninfective gastroenteritis and colitis, unspecified: Secondary | ICD-10-CM

## 2023-04-26 DIAGNOSIS — R519 Headache, unspecified: Secondary | ICD-10-CM

## 2023-04-26 DIAGNOSIS — K625 Hemorrhage of anus and rectum: Secondary | ICD-10-CM

## 2023-04-26 DIAGNOSIS — R634 Abnormal weight loss: Secondary | ICD-10-CM

## 2023-05-04 DIAGNOSIS — K625 Hemorrhage of anus and rectum: Secondary | ICD-10-CM | POA: Diagnosis not present

## 2023-05-04 DIAGNOSIS — K2100 Gastro-esophageal reflux disease with esophagitis, without bleeding: Secondary | ICD-10-CM | POA: Diagnosis not present

## 2023-05-04 DIAGNOSIS — K529 Noninfective gastroenteritis and colitis, unspecified: Secondary | ICD-10-CM | POA: Diagnosis not present

## 2023-05-25 DIAGNOSIS — F419 Anxiety disorder, unspecified: Secondary | ICD-10-CM | POA: Diagnosis not present

## 2023-05-25 DIAGNOSIS — G43101 Migraine with aura, not intractable, with status migrainosus: Secondary | ICD-10-CM | POA: Diagnosis not present

## 2023-05-25 DIAGNOSIS — G894 Chronic pain syndrome: Secondary | ICD-10-CM | POA: Diagnosis not present

## 2023-05-25 DIAGNOSIS — I10 Essential (primary) hypertension: Secondary | ICD-10-CM | POA: Diagnosis not present

## 2023-06-15 ENCOUNTER — Ambulatory Visit (HOSPITAL_COMMUNITY)
Admission: RE | Admit: 2023-06-15 | Discharge: 2023-06-15 | Disposition: A | Discharge status: Home / Self Care | Payer: 59 | Source: Ambulatory Visit | Attending: Internal Medicine | Admitting: Internal Medicine

## 2023-06-15 ENCOUNTER — Encounter (HOSPITAL_COMMUNITY): Payer: Self-pay

## 2023-06-15 DIAGNOSIS — K529 Noninfective gastroenteritis and colitis, unspecified: Secondary | ICD-10-CM

## 2023-06-15 DIAGNOSIS — R519 Headache, unspecified: Secondary | ICD-10-CM | POA: Diagnosis not present

## 2023-06-15 DIAGNOSIS — R634 Abnormal weight loss: Secondary | ICD-10-CM

## 2023-06-15 DIAGNOSIS — K625 Hemorrhage of anus and rectum: Secondary | ICD-10-CM | POA: Diagnosis not present

## 2023-06-15 MED ORDER — IOHEXOL 300 MG/ML  SOLN
100.0000 mL | Freq: Once | INTRAMUSCULAR | Status: AC | PRN
  Administered 2023-06-15: 100 mL via INTRAVENOUS

## 2023-06-23 DIAGNOSIS — G894 Chronic pain syndrome: Secondary | ICD-10-CM | POA: Diagnosis not present

## 2023-06-23 DIAGNOSIS — I10 Essential (primary) hypertension: Secondary | ICD-10-CM | POA: Diagnosis not present

## 2023-06-23 DIAGNOSIS — F419 Anxiety disorder, unspecified: Secondary | ICD-10-CM | POA: Diagnosis not present

## 2023-06-23 DIAGNOSIS — M1991 Primary osteoarthritis, unspecified site: Secondary | ICD-10-CM | POA: Diagnosis not present

## 2023-08-04 ENCOUNTER — Other Ambulatory Visit (HOSPITAL_COMMUNITY)
Admission: RE | Admit: 2023-08-04 | Discharge: 2023-08-04 | Disposition: A | Discharge status: Home / Self Care | Payer: 59 | Source: Ambulatory Visit | Attending: Obstetrics & Gynecology | Admitting: Obstetrics & Gynecology

## 2023-08-04 ENCOUNTER — Ambulatory Visit (INDEPENDENT_AMBULATORY_CARE_PROVIDER_SITE_OTHER): Payer: 59 | Admitting: Obstetrics & Gynecology

## 2023-08-04 ENCOUNTER — Encounter: Payer: Self-pay | Admitting: Obstetrics & Gynecology

## 2023-08-04 VITALS — BP 132/82 | HR 80 | Ht 63.5 in | Wt 128.8 lb

## 2023-08-04 DIAGNOSIS — Z01419 Encounter for gynecological examination (general) (routine) without abnormal findings: Secondary | ICD-10-CM | POA: Insufficient documentation

## 2023-08-04 DIAGNOSIS — N941 Unspecified dyspareunia: Secondary | ICD-10-CM | POA: Diagnosis not present

## 2023-08-04 DIAGNOSIS — Z9889 Other specified postprocedural states: Secondary | ICD-10-CM

## 2023-08-04 DIAGNOSIS — N951 Menopausal and female climacteric states: Secondary | ICD-10-CM | POA: Diagnosis not present

## 2023-08-04 DIAGNOSIS — Z1231 Encounter for screening mammogram for malignant neoplasm of breast: Secondary | ICD-10-CM

## 2023-08-04 DIAGNOSIS — N939 Abnormal uterine and vaginal bleeding, unspecified: Secondary | ICD-10-CM

## 2023-08-04 MED ORDER — NORETHINDRONE 0.35 MG PO TABS: 1.0000 | ORAL_TABLET | Freq: Every day | ORAL | 4 refills | Status: DC

## 2023-08-04 NOTE — Progress Notes (Signed)
WELL-WOMAN EXAMINATION Patient name: [name] MRN [id number]  Date of birth: Jul 15, 1981 Chief Complaint:   Gynecologic Exam  History of Present Illness:   [name] is a 42 y.o. [id number]  female being seen today for a routine well-woman exam and the following concerns:  AUB: s/p endometrial ablation- 2019 -Having sex heard something pop- started to spot ever since, which was about 4-6 mos ago.  Typically twice a month.    Old brown spotting, random.  Requires a pad, will last for about 1-2 days.  Also note occasional pain with sex.    -She also notes night sweats/hot flashes.  Rates her symptoms 4-5/10.  She has not tried any supplements  Migraines with aura  No LMP recorded. Patient has had an ablation. Denies issues with her menses The current method of family planning is tubal ligation.    Last pap 2021.  Last mammogram: 08/2022. Last colonoscopy: NA     08/04/2023    3:02 PM 02/28/2020    2:55 PM 11/18/2017    3:38 PM  Depression screen PHQ 2/9  Decreased Interest 0 0 0  Down, Depressed, Hopeless 0 0 0  PHQ - 2 Score 0 0 0  Altered sleeping 0 1   Tired, decreased energy 1 0   Change in appetite 0 0   Feeling bad or failure about yourself  0 0   Trouble concentrating  0   Moving slowly or fidgety/restless  0   Suicidal thoughts  0   PHQ-9 Score 1 1       Review of Systems:   Pertinent items are noted in HPI Denies any headaches, blurred vision, fatigue, shortness of breath, chest pain, abdominal pain, bowel movements, urination, or intercourse unless otherwise stated above.  Pertinent History Reviewed:  Reviewed past medical,surgical, social and family history.  Reviewed problem list, medications and allergies. Physical Assessment:   Vitals:   08/04/23 1505  BP: 132/82  Pulse: 80  Weight: 128 lb 12.8 oz (58.4 kg)  Height: 5' 3.5" (1.613 m)  Body mass index is 22.46 kg/m.        Physical Examination:   General appearance - well  appearing, and in no distress  Mental status - alert, oriented to person, place, and time  Psych:  She has a normal mood and affect  Skin - warm and dry, normal color, no suspicious lesions noted  Chest - effort normal, all lung fields clear to auscultation bilaterally  Heart - normal rate and regular rhythm  Neck:  midline trachea, no thyromegaly or nodules  Breasts - breasts appear normal, no suspicious masses, no skin or nipple changes or  axillary nodes  Abdomen - soft, nontender, nondistended, no masses or organomegaly  Pelvic - VULVA: normal appearing vulva with no masses, tenderness or lesions  VAGINA: normal appearing vagina with normal color and discharge, no lesions  CERVIX: normal appearing cervix without discharge or lesions, no CMT  Thin prep pap is done with HR HPV cotesting  UTERUS: uterus is felt to be normal size, shape, consistency and nontender   ADNEXA: No adnexal masses or tenderness noted.  Extremities:  No swelling or varicosities noted  Chaperone: Faith Rogue     Assessment & Plan:  1) Well-Woman Exam -pap collected, reviewed ASCCP guidelines -Mammogram to be done next month  2) AUB,Dyspareunia, Vasomotor symptoms s/p endometrial ablation -Plan for pelvic ultrasound to rule out underlying etiology -Reviewed perimenopausal status -Discussed over-the-counter herbal supplements such as Jeffie Pollock,  ancient minerals -Due to migraines with aura, patient has contraindication to combined therapy -Plan for trial of POPs, follow-up in 3 months   Meds ordered this encounter  Medications   norethindrone (MICRONOR) 0.35 MG tablet    Sig: Take 1 tablet (0.35 mg total) by mouth daily.    Dispense:  90 tablet    Refill:  4     Follow-up: Return in about 3 months (around 11/04/2023) for Medication follow up.   Myna Hidalgo, DO Attending Obstetrician & Gynecologist, [facility] for [employer], [facility]

## 2023-08-09 LAB — CYTOLOGY - PAP
Comment: NEGATIVE
Diagnosis: NEGATIVE
High risk HPV: NEGATIVE

## 2023-08-17 ENCOUNTER — Other Ambulatory Visit: Payer: 59

## 2023-08-19 ENCOUNTER — Other Ambulatory Visit: Payer: 59

## 2023-08-29 ENCOUNTER — Other Ambulatory Visit: Payer: Self-pay

## 2023-08-29 ENCOUNTER — Emergency Department (HOSPITAL_COMMUNITY)
Admission: EM | Admit: 2023-08-29 | Discharge: 2023-08-30 | Disposition: A | Discharge status: Home / Self Care | Payer: 59

## 2023-08-29 ENCOUNTER — Encounter (HOSPITAL_COMMUNITY): Payer: Self-pay

## 2023-08-29 DIAGNOSIS — I10 Essential (primary) hypertension: Secondary | ICD-10-CM | POA: Diagnosis not present

## 2023-08-29 DIAGNOSIS — R1013 Epigastric pain: Secondary | ICD-10-CM | POA: Insufficient documentation

## 2023-08-29 DIAGNOSIS — D72829 Elevated white blood cell count, unspecified: Secondary | ICD-10-CM | POA: Insufficient documentation

## 2023-08-29 DIAGNOSIS — R109 Unspecified abdominal pain: Secondary | ICD-10-CM | POA: Diagnosis not present

## 2023-08-29 DIAGNOSIS — R1011 Right upper quadrant pain: Secondary | ICD-10-CM | POA: Diagnosis not present

## 2023-08-29 DIAGNOSIS — R112 Nausea with vomiting, unspecified: Secondary | ICD-10-CM | POA: Diagnosis not present

## 2023-08-29 DIAGNOSIS — R1012 Left upper quadrant pain: Secondary | ICD-10-CM | POA: Insufficient documentation

## 2023-08-29 DIAGNOSIS — E876 Hypokalemia: Secondary | ICD-10-CM | POA: Diagnosis not present

## 2023-08-29 DIAGNOSIS — R7401 Elevation of levels of liver transaminase levels: Secondary | ICD-10-CM | POA: Insufficient documentation

## 2023-08-29 LAB — CBC
HCT: 40.7 % (ref 36.0–46.0)
Hemoglobin: 13.9 g/dL (ref 12.0–15.0)
MCH: 32.1 pg (ref 26.0–34.0)
MCHC: 34.2 g/dL (ref 30.0–36.0)
MCV: 94 fL (ref 80.0–100.0)
Platelets: 258 10*3/uL (ref 150–400)
RBC: 4.33 MIL/uL (ref 3.87–5.11)
RDW: 11.1 % — ABNORMAL LOW (ref 11.5–15.5)
WBC: 12 10*3/uL — ABNORMAL HIGH (ref 4.0–10.5)
nRBC: 0 % (ref 0.0–0.2)

## 2023-08-29 LAB — URINALYSIS, ROUTINE W REFLEX MICROSCOPIC
Bacteria, UA: NONE SEEN
Bilirubin Urine: NEGATIVE
Glucose, UA: NEGATIVE mg/dL
Ketones, ur: 80 mg/dL — AB
Leukocytes,Ua: NEGATIVE
Nitrite: NEGATIVE
Protein, ur: 100 mg/dL — AB
RBC / HPF: >50 RBC/hpf (ref 0–5)
Specific Gravity, Urine: 1.028 (ref 1.005–1.030)
pH: 5 (ref 5.0–8.0)

## 2023-08-29 LAB — COMPREHENSIVE METABOLIC PANEL
ALT: 21 U/L (ref 0–44)
AST: 21 U/L (ref 15–41)
Albumin: 4.4 g/dL (ref 3.5–5.0)
Alkaline Phosphatase: 31 U/L — ABNORMAL LOW (ref 38–126)
Anion gap: 11 (ref 5–15)
BUN: 11 mg/dL (ref 6–20)
CO2: 23 mmol/L (ref 22–32)
Calcium: 9.2 mg/dL (ref 8.9–10.3)
Chloride: 101 mmol/L (ref 98–111)
Creatinine, Ser: 0.72 mg/dL (ref 0.44–1.00)
GFR, Estimated: >60 mL/min (ref >60)
Glucose, Bld: 122 mg/dL — ABNORMAL HIGH (ref 70–99)
Potassium: 3.6 mmol/L (ref 3.5–5.1)
Sodium: 135 mmol/L (ref 135–145)
Total Bilirubin: 0.6 mg/dL (ref 0.3–1.2)
Total Protein: 7.6 g/dL (ref 6.5–8.1)

## 2023-08-29 LAB — POC URINE PREG, ED: Preg Test, Ur: NEGATIVE

## 2023-08-29 LAB — LIPASE, BLOOD: Lipase: 27 U/L (ref 11–51)

## 2023-08-29 MED ORDER — METOCLOPRAMIDE HCL 5 MG/ML IJ SOLN
10.0000 mg | Freq: Once | INTRAMUSCULAR | Status: AC
  Administered 2023-08-29: 10 mg via INTRAVENOUS
  Filled 2023-08-29: qty 2

## 2023-08-29 MED ORDER — ONDANSETRON HCL 4 MG/2ML IJ SOLN
4.0000 mg | Freq: Once | INTRAMUSCULAR | Status: AC
  Administered 2023-08-29: 4 mg via INTRAVENOUS
  Filled 2023-08-29: qty 2

## 2023-08-29 MED ORDER — METOCLOPRAMIDE HCL 5 MG PO TABS: 5.0000 mg | ORAL_TABLET | Freq: Four times a day (QID) | ORAL | 0 refills | Status: DC | PRN

## 2023-08-29 MED ORDER — PANTOPRAZOLE SODIUM 20 MG PO TBEC: 20.0000 mg | DELAYED_RELEASE_TABLET | Freq: Every day | ORAL | 0 refills | Status: DC

## 2023-08-29 MED ORDER — SODIUM CHLORIDE 0.9 % IV BOLUS
1000.0000 mL | Freq: Once | INTRAVENOUS | Status: AC
  Administered 2023-08-29: 1000 mL via INTRAVENOUS

## 2023-08-29 MED ORDER — DIPHENHYDRAMINE HCL 50 MG/ML IJ SOLN: 25.0000 mg | Freq: Once | INTRAMUSCULAR | Status: DC

## 2023-08-29 NOTE — Discharge Instructions (Signed)
You should return to the hospital if you experience return of persistent nausea and vomiting that does not resolve and does not allow you to tolerate any food or fluids, persistent fevers for greater than 2-3 more days, increasing abdominal pain that persists despite medications, persistent diarrhea, dizziness, syncope (fainting), or for any other concerns.    Please return to the emergency department immediately for any new or concerning symptoms, or if you get worse. 

## 2023-08-29 NOTE — ED Provider Notes (Signed)
[place] EMERGENCY DEPARTMENT AT [facility] Provider Note   CSN: [id number] Arrival date & time: 08/29/23  2019     History  Chief Complaint  Patient presents with   Emesis    [name] is a 42 y.o. female.  Patient to ED with TNTC episodes of vomiting today. No fever or diarrhea. Emesis has been nonbloody but she reports it has been bilious. Prior surgeries include cholecystectomy. No alcohol use.   The history is provided by the patient. No language interpreter was used.  Emesis      Home Medications Prior to Admission medications   Medication Sig Start Date End Date Taking? Authorizing Provider  busPIRone (BUSPAR) 10 MG tablet Take 10 mg by mouth 2 (two) times daily. 01/12/20   [provider]  gabapentin (NEURONTIN) 300 MG capsule Take 300 mg by mouth 3 (three) times daily. 02/09/20   [provider]  HYDROcodone-acetaminophen (NORCO) 10-325 MG per tablet Take 1 tablet by mouth every 6 (six) hours as needed for moderate pain.    [provider]  Multiple Vitamins-Minerals (MULTIVITAMIN ADULT) CHEW Chew 2 each by mouth daily.    [provider]  norethindrone (MICRONOR) 0.35 MG tablet Take 1 tablet (0.35 mg total) by mouth daily. 08/04/23 11/02/23  Myna Hidalgo, DO  pantoprazole (PROTONIX) 40 MG tablet Take 1 tablet (40 mg total) by mouth 2 (two) times daily before a meal. 01/28/14   Dorothea Ogle, MD  traZODone (DESYREL) 100 MG tablet Take 100 mg by mouth at bedtime.    [provider]      Allergies    Demerol and Orange fruit [citrus]    Review of Systems   Review of Systems  Gastrointestinal:  Positive for vomiting.    Physical Exam Updated Vital Signs BP 128/76   Pulse 73   Temp (!) 97.2 F (36.2 C)   Resp 16   Ht 5\' 3"  (1.6 m)   Wt 57.6 kg   SpO2 99%   BMI 22.50 kg/m  Physical Exam Vitals and nursing note reviewed.  Constitutional:      Appearance: Normal appearance. She is not  toxic-appearing.  HENT:     Mouth/Throat:     Mouth: Mucous membranes are dry.  Cardiovascular:     Rate and Rhythm: Normal rate.  Pulmonary:     Effort: Pulmonary effort is normal.  Abdominal:     General: Abdomen is flat. There is no distension.     Palpations: Abdomen is soft.     Tenderness: There is abdominal tenderness (Epigastric tenderness.).  Musculoskeletal:        General: Normal range of motion.     Cervical back: Normal range of motion.  Skin:    General: Skin is warm and dry.  Neurological:     Mental Status: She is alert and oriented to person, place, and time.     ED Results / Procedures / Treatments   Labs (all labs ordered are listed, but only abnormal results are displayed) Labs Reviewed  COMPREHENSIVE METABOLIC PANEL - Abnormal; Notable for the following components:      Result Value   Glucose, Bld 122 (*)    Alkaline Phosphatase 31 (*)    All other components within normal limits  CBC - Abnormal; Notable for the following components:   WBC 12.0 (*)    RDW 11.1 (*)    All other components within normal limits  LIPASE, BLOOD  URINALYSIS, ROUTINE W REFLEX  MICROSCOPIC  POC URINE PREG, ED   Results for orders placed or performed during the hospital encounter of 08/29/23  Lipase, blood  Result Value Ref Range   Lipase 27 11 - 51 U/L  Comprehensive metabolic panel  Result Value Ref Range   Sodium 135 135 - 145 mmol/L   Potassium 3.6 3.5 - 5.1 mmol/L   Chloride 101 98 - 111 mmol/L   CO2 23 22 - 32 mmol/L   Glucose, Bld 122 (H) 70 - 99 mg/dL   BUN 11 6 - 20 mg/dL   Creatinine, Ser 1.61 0.44 - 1.00 mg/dL   Calcium 9.2 8.9 - 09.6 mg/dL   Total Protein 7.6 6.5 - 8.1 g/dL   Albumin 4.4 3.5 - 5.0 g/dL   AST 21 15 - 41 U/L   ALT 21 0 - 44 U/L   Alkaline Phosphatase 31 (L) 38 - 126 U/L   Total Bilirubin 0.6 0.3 - 1.2 mg/dL   GFR, Estimated >04 >54 mL/min   Anion gap 11 5 - 15  CBC  Result Value Ref Range   WBC 12.0 (H) 4.0 - 10.5 K/uL   RBC 4.33  3.87 - 5.11 MIL/uL   Hemoglobin 13.9 12.0 - 15.0 g/dL   HCT 09.8 11.9 - 14.7 %   MCV 94.0 80.0 - 100.0 fL   MCH 32.1 26.0 - 34.0 pg   MCHC 34.2 30.0 - 36.0 g/dL   RDW 82.9 (L) 56.2 - 13.0 %   Platelets 258 150 - 400 K/uL   nRBC 0.0 0.0 - 0.2 %    EKG None  Radiology No results found.  Procedures Procedures    Medications Ordered in ED Medications  diphenhydrAMINE (BENADRYL) injection 25 mg (has no administration in time range)  sodium chloride 0.9 % bolus 1,000 mL (0 mLs Intravenous Stopped 08/29/23 2221)  ondansetron (ZOFRAN) injection 4 mg (4 mg Intravenous Given 08/29/23 2141)  metoCLOPramide (REGLAN) injection 10 mg (10 mg Intravenous Given 08/29/23 2249)  sodium chloride 0.9 % bolus 1,000 mL (1,000 mLs Intravenous New Bag/Given 08/29/23 2249)    ED Course/ Medical Decision Making/ A&P Clinical Course as of 08/29/23 2313  Sun Aug 29, 2023  2220 Recheck: IV fluids (1L) complete. No significant relief with IV Zofran. Reglan ordered. No vomiting in ED.  [SU]    Clinical Course User Index [SU] Elpidio Anis, PA-C                                 Medical Decision Making This patient presents to the ED for concern of vomiting, this involves an extensive number of treatment options, and is a complaint that carries with it a high risk of complications and morbidity.  The differential diagnosis includes SBO/LBO, PUD, pancreatitis, viral process, gastritis   Co morbidities that complicate the patient evaluation  Arthritis, anxiety, acid reflux, HTN   Lab Tests:  I Ordered, and personally interpreted labs.  The pertinent results include:  Mild leukocytosis of 12.7, likely reactive. Normal electrolytes.       Problem List / ED Course / Critical interventions / Medication management  Copious vomiting throughout today. Epigastric pain. No fever. No vomiting in ED - IV fluids, antiemetics.   Test / Admission - Considered:  Patient care signed out to Dr. Wallace Cullens pending  review of urinalysis and PO challenge.     Amount and/or Complexity of Data Reviewed Labs: ordered.  Risk Prescription drug management.  Final Clinical Impression(s) / ED Diagnoses Final diagnoses:  Nausea and vomiting, unspecified vomiting type    Rx / DC Orders ED Discharge Orders     None         Elpidio Anis, PA-C 08/29/23 2313    Coral Spikes, DO 09/01/23 2319

## 2023-08-29 NOTE — ED Triage Notes (Signed)
Pt reports she has been vomiting for the past 24 hours and has not been able to urinate in the past 4 hours.

## 2023-08-29 NOTE — ED Provider Notes (Signed)
  Provider Note MRN:  [id number]  Arrival date & time: 08/29/23    ED Course and Medical Decision Making  Assumed care from Dr Ronna Polio upstill at shift change.  See note from prior team for complete details, in brief:  42 year old female here with vomiting, abdominal cramping No diarrhea Labs okay   Plan per prior physician po chall/ ua  UA neg Tolerating PO Ivf completed Requesting dc  Patient presents with vomiting and abdominal cramping. Sx suggestive of enteritis or food born illness, gastritis. Surgical or other more serious etiology appears very unlikely. The patient is improved with ED treatment. Will discharge with observation and symptomatic treatment. Abdominal pain warnings discussed.    Procedures  Final Clinical Impressions(s) / ED Diagnoses     ICD-10-CM   1. Nausea and vomiting, unspecified vomiting type  R11.2       ED Discharge Orders     None       Discharge Instructions   None        Sloan Leiter, DO 08/29/23 2353

## 2023-08-30 ENCOUNTER — Other Ambulatory Visit: Payer: Self-pay

## 2023-08-30 ENCOUNTER — Encounter (HOSPITAL_COMMUNITY): Payer: Self-pay

## 2023-08-30 ENCOUNTER — Emergency Department (HOSPITAL_COMMUNITY)
Admission: EM | Admit: 2023-08-30 | Discharge: 2023-08-30 | Disposition: A | Discharge status: Home / Self Care | Payer: 59 | Source: Home / Self Care | Attending: Emergency Medicine | Admitting: Emergency Medicine

## 2023-08-30 ENCOUNTER — Emergency Department (HOSPITAL_COMMUNITY): Payer: 59

## 2023-08-30 DIAGNOSIS — R7401 Elevation of levels of liver transaminase levels: Secondary | ICD-10-CM | POA: Insufficient documentation

## 2023-08-30 DIAGNOSIS — I10 Essential (primary) hypertension: Secondary | ICD-10-CM | POA: Insufficient documentation

## 2023-08-30 DIAGNOSIS — R1011 Right upper quadrant pain: Secondary | ICD-10-CM | POA: Insufficient documentation

## 2023-08-30 DIAGNOSIS — R1013 Epigastric pain: Secondary | ICD-10-CM | POA: Insufficient documentation

## 2023-08-30 DIAGNOSIS — E876 Hypokalemia: Secondary | ICD-10-CM | POA: Insufficient documentation

## 2023-08-30 DIAGNOSIS — R1012 Left upper quadrant pain: Secondary | ICD-10-CM | POA: Insufficient documentation

## 2023-08-30 DIAGNOSIS — R112 Nausea with vomiting, unspecified: Secondary | ICD-10-CM | POA: Insufficient documentation

## 2023-08-30 DIAGNOSIS — R109 Unspecified abdominal pain: Secondary | ICD-10-CM | POA: Diagnosis not present

## 2023-08-30 LAB — CBC WITH DIFFERENTIAL/PLATELET
Abs Immature Granulocytes: 0.03 10*3/uL (ref 0.00–0.07)
Basophils Absolute: 0 10*3/uL (ref 0.0–0.1)
Basophils Relative: 0 %
Eosinophils Absolute: 0 10*3/uL (ref 0.0–0.5)
Eosinophils Relative: 0 %
HCT: 41 % (ref 36.0–46.0)
Hemoglobin: 14.1 g/dL (ref 12.0–15.0)
Immature Granulocytes: 0 %
Lymphocytes Relative: 19 %
Lymphs Abs: 1.9 10*3/uL (ref 0.7–4.0)
MCH: 32.5 pg (ref 26.0–34.0)
MCHC: 34.4 g/dL (ref 30.0–36.0)
MCV: 94.5 fL (ref 80.0–100.0)
Monocytes Absolute: 1.2 10*3/uL — ABNORMAL HIGH (ref 0.1–1.0)
Monocytes Relative: 11 %
Neutro Abs: 7.3 10*3/uL (ref 1.7–7.7)
Neutrophils Relative %: 70 %
Platelets: 229 10*3/uL (ref 150–400)
RBC: 4.34 MIL/uL (ref 3.87–5.11)
RDW: 11.3 % — ABNORMAL LOW (ref 11.5–15.5)
WBC: 10.4 10*3/uL (ref 4.0–10.5)
nRBC: 0 % (ref 0.0–0.2)

## 2023-08-30 LAB — COMPREHENSIVE METABOLIC PANEL
ALT: 46 U/L — ABNORMAL HIGH (ref 0–44)
AST: 46 U/L — ABNORMAL HIGH (ref 15–41)
Albumin: 3.6 g/dL (ref 3.5–5.0)
Alkaline Phosphatase: 28 U/L — ABNORMAL LOW (ref 38–126)
Anion gap: 9 (ref 5–15)
BUN: 7 mg/dL (ref 6–20)
CO2: 21 mmol/L — ABNORMAL LOW (ref 22–32)
Calcium: 7.9 mg/dL — ABNORMAL LOW (ref 8.9–10.3)
Chloride: 107 mmol/L (ref 98–111)
Creatinine, Ser: 0.62 mg/dL (ref 0.44–1.00)
GFR, Estimated: >60 mL/min (ref >60)
Glucose, Bld: 105 mg/dL — ABNORMAL HIGH (ref 70–99)
Potassium: 3.2 mmol/L — ABNORMAL LOW (ref 3.5–5.1)
Sodium: 137 mmol/L (ref 135–145)
Total Bilirubin: 0.9 mg/dL (ref <1.2)
Total Protein: 5.8 g/dL — ABNORMAL LOW (ref 6.5–8.1)

## 2023-08-30 LAB — LIPASE, BLOOD: Lipase: 31 U/L (ref 11–51)

## 2023-08-30 MED ORDER — PROMETHAZINE HCL 25 MG RE SUPP: 25.0000 mg | Freq: Four times a day (QID) | RECTAL | 0 refills | Status: DC | PRN

## 2023-08-30 MED ORDER — MORPHINE SULFATE (PF) 4 MG/ML IV SOLN
4.0000 mg | Freq: Once | INTRAVENOUS | Status: AC
  Administered 2023-08-30: 4 mg via INTRAVENOUS
  Filled 2023-08-30: qty 1

## 2023-08-30 MED ORDER — IOHEXOL 300 MG/ML  SOLN
100.0000 mL | Freq: Once | INTRAMUSCULAR | Status: AC | PRN
  Administered 2023-08-30: 75 mL via INTRAVENOUS

## 2023-08-30 MED ORDER — SUCRALFATE 1 G PO TABS: 1.0000 g | ORAL_TABLET | Freq: Three times a day (TID) | ORAL | 0 refills | Status: DC

## 2023-08-30 MED ORDER — POTASSIUM CHLORIDE CRYS ER 20 MEQ PO TBCR
40.0000 meq | EXTENDED_RELEASE_TABLET | Freq: Once | ORAL | Status: AC
  Administered 2023-08-30: 40 meq via ORAL
  Filled 2023-08-30: qty 2

## 2023-08-30 MED ORDER — LIDOCAINE VISCOUS HCL 2 % MT SOLN
15.0000 mL | Freq: Once | OROMUCOSAL | Status: AC
  Administered 2023-08-30: 15 mL via ORAL
  Filled 2023-08-30: qty 15

## 2023-08-30 MED ORDER — ALUM & MAG HYDROXIDE-SIMETH 200-200-20 MG/5ML PO SUSP
30.0000 mL | Freq: Once | ORAL | Status: AC
  Administered 2023-08-30: 30 mL via ORAL
  Filled 2023-08-30: qty 30

## 2023-08-30 MED ORDER — SODIUM CHLORIDE 0.9 % IV BOLUS
1000.0000 mL | Freq: Once | INTRAVENOUS | Status: AC
  Administered 2023-08-30: 1000 mL via INTRAVENOUS

## 2023-08-30 MED ORDER — DROPERIDOL 2.5 MG/ML IJ SOLN
1.2500 mg | Freq: Once | INTRAMUSCULAR | Status: AC
  Administered 2023-08-30: 1.25 mg via INTRAVENOUS
  Filled 2023-08-30: qty 2

## 2023-08-30 NOTE — ED Provider Notes (Signed)
[place] EMERGENCY DEPARTMENT AT [facility] Provider Note   CSN: [id number] Arrival date & time: 08/30/23  1551     History  Chief Complaint  Patient presents with   Abdominal Pain    [name] is a 42 y.o. female.  With history of anxiety, hypertension, GERD presenting to the ED for evaluation of abdominal pain, nausea and vomiting.  Symptoms began Saturday.  She states she got some chicken Alfredo on Friday, a half of them for the rest of the fridge.  She ate the other half on Saturday and a few hours later began having abdominal pain, nausea and vomiting.  She presented to the emergency department yesterday and had improvement in her symptoms and was discharged home with antiemetics and Protonix.  She states she picked of these medications and use them but has continued to vomit and have abdominal pain today.  Pain is localized in the upper abdomen.  It is described as a cramping pain.  It is constant.  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  She denies any fevers, hematemesis.  She did have some diarrhea yesterday.  She denies melena or hematochezia.  She denies smoking, recreational drug use, alcohol use.  She denies any urinary symptoms or lower abdominal pain.   Abdominal Pain Associated symptoms: nausea and vomiting        Home Medications Prior to Admission medications   Medication Sig Start Date End Date Taking? Authorizing Provider  promethazine (PHENERGAN) 25 MG suppository Place 1 suppository (25 mg total) rectally every 6 (six) hours as needed for nausea or vomiting. 08/30/23  Yes Evy Lutterman, Edsel Petrin, PA-C  sucralfate (CARAFATE) 1 g tablet Take 1 tablet (1 g total) by mouth 4 (four) times daily -  with meals and at bedtime for 4 days. 08/30/23 09/03/23 Yes Jonty Morrical, Edsel Petrin, PA-C  busPIRone (BUSPAR) 10 MG tablet Take 10 mg by mouth 2 (two) times daily. 01/12/20   [provider]  gabapentin (NEURONTIN) 300 MG capsule Take 300 mg  by mouth 3 (three) times daily. 02/09/20   [provider]  HYDROcodone-acetaminophen (NORCO) 10-325 MG per tablet Take 1 tablet by mouth every 6 (six) hours as needed for moderate pain.    [provider]  metoCLOPramide (REGLAN) 5 MG tablet Take 1 tablet (5 mg total) by mouth every 6 (six) hours as needed for nausea. 08/29/23   Sloan Leiter, DO  Multiple Vitamins-Minerals (MULTIVITAMIN ADULT) CHEW Chew 2 each by mouth daily.    [provider]  norethindrone (MICRONOR) 0.35 MG tablet Take 1 tablet (0.35 mg total) by mouth daily. 08/04/23 11/02/23  Myna Hidalgo, DO  pantoprazole (PROTONIX) 20 MG tablet Take 1 tablet (20 mg total) by mouth daily for 14 days. 08/29/23 09/12/23  Sloan Leiter, DO  traZODone (DESYREL) 100 MG tablet Take 100 mg by mouth at bedtime.    [provider]      Allergies    Demerol and Orange fruit [citrus]    Review of Systems   Review of Systems  Gastrointestinal:  Positive for abdominal pain, nausea and vomiting.  All other systems reviewed and are negative.   Physical Exam Updated Vital Signs BP 125/61   Pulse 70   Temp 98.4 F (36.9 C) (Oral)   Resp 18   Ht 5\' 3"  (1.6 m)   Wt 57.6 kg   SpO2 95%   BMI 22.50 kg/m  Physical Exam Vitals and nursing note reviewed.  Constitutional:      General: She is not in acute distress.    Appearance: She is well-developed.     Comments: Resting comfortably in bed  HENT:     Head: Normocephalic and atraumatic.  Eyes:     Conjunctiva/sclera: Conjunctivae normal.  Cardiovascular:     Rate and Rhythm: Normal rate and regular rhythm.     Heart sounds: No murmur heard. Pulmonary:     Effort: Pulmonary effort is normal. No respiratory distress.     Breath sounds: Normal breath sounds.  Abdominal:     Palpations: Abdomen is soft.     Tenderness: There is abdominal tenderness in the right upper quadrant, epigastric area and left upper quadrant. Negative signs include Rovsing's  sign, McBurney's sign, psoas sign and obturator sign.  Musculoskeletal:        General: No swelling.     Cervical back: Neck supple.  Skin:    General: Skin is warm and dry.     Capillary Refill: Capillary refill takes less than 2 seconds.  Neurological:     Mental Status: She is alert.  Psychiatric:        Mood and Affect: Mood normal.     ED Results / Procedures / Treatments   Labs (all labs ordered are listed, but only abnormal results are displayed) Labs Reviewed  CBC WITH DIFFERENTIAL/PLATELET - Abnormal; Notable for the following components:      Result Value   RDW 11.3 (*)    Monocytes Absolute 1.2 (*)    All other components within normal limits  COMPREHENSIVE METABOLIC PANEL - Abnormal; Notable for the following components:   Potassium 3.2 (*)    CO2 21 (*)    Glucose, Bld 105 (*)    Calcium 7.9 (*)    Total Protein 5.8 (*)    AST 46 (*)    ALT 46 (*)    Alkaline Phosphatase 28 (*)    All other components within normal limits  LIPASE, BLOOD    EKG None  Radiology CT ABDOMEN PELVIS W CONTRAST  Result Date: 08/30/2023 CLINICAL DATA:  Abdominal pain, vomiting EXAM: CT ABDOMEN AND PELVIS WITH CONTRAST TECHNIQUE: Multidetector CT imaging of the abdomen and pelvis was performed using the standard protocol following bolus administration of intravenous contrast. RADIATION DOSE REDUCTION: This exam was performed according to the departmental dose-optimization program which includes automated exposure control, adjustment of the mA and/or kV according to patient size and/or use of iterative reconstruction technique. CONTRAST:  75mL OMNIPAQUE IOHEXOL 300 MG/ML  SOLN COMPARISON:  06/15/2023 FINDINGS: Lower chest: No acute abnormality Hepatobiliary: No focal hepatic abnormality. Gallbladder unremarkable. Pancreas: No focal abnormality or ductal dilatation. Spleen: No focal abnormality.  Normal size. Adrenals/Urinary Tract: No adrenal abnormality. No focal renal abnormality. No  stones or hydronephrosis. Urinary bladder is unremarkable. Stomach/Bowel: Stomach, large and small bowel grossly unremarkable. Normal appendix. Vascular/Lymphatic: No evidence of aneurysm or adenopathy. Reproductive: Uterus and adnexa unremarkable.  No mass. Other: No free fluid or free air. Musculoskeletal: No acute bony abnormality. IMPRESSION: No acute findings in the abdomen or pelvis. Electronically Signed   By: Charlett Nose M.D.   On: 08/30/2023 20:05    Procedures Procedures    Medications Ordered in ED Medications  potassium chloride SA (KLOR-CON M) CR tablet 40 mEq (has no administration in time range)  sodium chloride 0.9 % bolus 1,000 mL (1,000 mLs Intravenous Bolus 08/30/23 1645)  droperidol (INAPSINE) 2.5 MG/ML injection 1.25 mg (1.25 mg Intravenous Given 08/30/23  1645)  morphine (PF) 4 MG/ML injection 4 mg (4 mg Intravenous Given 08/30/23 1652)  iohexol (OMNIPAQUE) 300 MG/ML solution 100 mL (75 mLs Intravenous Contrast Given 08/30/23 1816)  alum & mag hydroxide-simeth (MAALOX/MYLANTA) 200-200-20 MG/5ML suspension 30 mL (30 mLs Oral Given 08/30/23 1821)    And  lidocaine (XYLOCAINE) 2 % viscous mouth solution 15 mL (15 mLs Oral Given 08/30/23 1821)    ED Course/ Medical Decision Making/ A&P                                 Medical Decision Making Amount and/or Complexity of Data Reviewed Labs: ordered. Radiology: ordered.  Risk OTC drugs. Prescription drug management.  This patient presents to the ED for concern nausea, vomiting, abdominal pain, this involves an extensive number of treatment options, and is a complaint that carries with it a high risk of complications and morbidity.  The emergent differential diagnosis for vomiting includes, but is not limited to ACS/MI, DKA, Ischemic bowel, Meningitis, Sepsis, Acute gastric dilation, Adrenal insufficiency, Appendicitis,  Bowel obstruction/ileus, Carbon monoxide poisoning, Cholecystitis, Electrolyte abnormalities, Elevated ICP,  Gastric outlet obstruction, Pancreatitis, Ruptured viscus, Biliary colic, Cannabinoid hyperemesis syndrome, Gastritis, Gastroenteritis, Gastroparesis,  Narcotic withdrawal, Peptic ulcer disease, and UTI   My initial workup includes labs, imaging, symptom control  Additional history obtained from: Nursing notes from this visit. Previous records within EMR system  ED visit for same yesterday  I ordered, reviewed and interpreted labs which include: CBC, CMP, lipase.  Mild hypokalemia of 3.2.  Mild elevation of AST and ALT.  Hypocalcemia of 7.9.  Labs otherwise reassuring  I ordered imaging studies including CT abdomen pelvis I independently visualized and interpreted imaging which showed normal I agree with the radiologist interpretation  Afebrile, hemodynamically stable.  42 year old female presenting for evaluation of upper abdominal pain, nausea and vomiting.  This began after she ate some leftover she cannot.  Presented yesterday for same and felt improved on discharge, however went home and had recurrence of symptoms which she could not manage today.  She does have a history of H. pylori in the stomach ulcers.  She was initially uncomfortable on exam.  She did have upper abdominal tenderness to palpation.  Repeat lab work and a CT abdomen pelvis.  Obtained to rule out perforated ulcer or other acute intra-abdominal abnormalities.  Hypokalemia replaced in the emergency department.  CT abdomen pelvis was negative.  Patient reported significant improvement after GI cocktail and IV droperidol.  Will send prescription for rectal Phenergan and Carafate.  She was encouraged to follow-up with her primary care provider in 1 week for reevaluation.  She was given return precautions.  Stable at discharge.  At this time there does not appear to be any evidence of an acute emergency medical condition and the patient appears stable for discharge with appropriate outpatient follow up. Diagnosis was discussed with  patient who verbalizes understanding of care plan and is agreeable to discharge. I have discussed return precautions with patient who verbalizes understanding. Patient encouraged to follow-up with their PCP within 1 week. All questions answered.  Note: Portions of this report may have been transcribed using voice recognition software. Every effort was made to ensure accuracy; however, inadvertent computerized transcription errors may still be present.        Final Clinical Impression(s) / ED Diagnoses Final diagnoses:  Nausea and vomiting, unspecified vomiting type    Rx / DC Orders ED  Discharge Orders          Ordered    promethazine (PHENERGAN) 25 MG suppository  Every 6 hours PRN        08/30/23 2015    sucralfate (CARAFATE) 1 g tablet  3 times daily with meals & bedtime        08/30/23 2015              Michelle Piper, PA-C 08/30/23 2019    Bethann Berkshire, MD 08/31/23 1042

## 2023-08-30 NOTE — ED Triage Notes (Signed)
Pt to er, pt states that she is here for vomiting.  States that she stated vomiting on Saturday, states that she was here yesterday for the same.  States that she also has a headache, states that she has a hx of migraines.  States that she can't keep anything down.

## 2023-08-30 NOTE — Discharge Instructions (Signed)
You have been seen today for your complaint of nausea and vomiting and belly pain. Your lab work was reassuring. Your imaging was reassuring. Your discharge medications include rectal Phenergan.  This is to help with your nausea.  I have also sent a prescription for Carafate which may help with stomach pain. Follow up with: Your primary care provider in 1 week for reevaluation Please seek immediate medical care if you develop any of the following symptoms: You have pain in your chest, neck, arm, or jaw. You feel extremely weak or you faint. You have persistent vomiting. You have vomit that is bright red or looks like black coffee grounds. You have bloody or black stools (feces) or stools that look like tar. You have a severe headache, a stiff neck, or both. You have severe pain, cramping, or bloating in your abdomen. You have difficulty breathing, or you are breathing very quickly. Your heart is beating very quickly. Your skin feels cold and clammy. You feel confused. You have signs of dehydration, such as: Dark urine, very little urine, or no urine. Cracked lips. Dry mouth. Sunken eyes. Sleepiness. Weakness. At this time there does not appear to be the presence of an emergent medical condition, however there is always the potential for conditions to change. Please read and follow the below instructions.  Do not take your medicine if  develop an itchy rash, swelling in your mouth or lips, or difficulty breathing; call 911 and seek immediate emergency medical attention if this occurs.  You may review your lab tests and imaging results in their entirety on your MyChart account.  Please discuss all results of fully with your primary care provider and other specialist at your follow-up visit.  Note: Portions of this text may have been transcribed using voice recognition software. Every effort was made to ensure accuracy; however, inadvertent computerized transcription errors may still be  present.

## 2023-08-30 NOTE — ED Notes (Signed)
Pt states her "headache has eased up a bit but abdominal pain is worse" also states that she feels "really restless and just wants to sleep bc I havent been able to in days" requesting something to help with that--PA-C made aware

## 2023-08-30 NOTE — ED Notes (Signed)
Phlebotomist to redraw cmp for CT scan

## 2023-08-30 NOTE — ED Notes (Signed)
Per PA-C, pts creatinine from her lab work yesterday was WNL and UA pregnancy was negative, pt is good to go to CT

## 2023-08-30 NOTE — ED Notes (Signed)
Pt back from CT

## 2023-08-30 NOTE — ED Notes (Signed)
Patient transported to CT 

## 2023-08-31 ENCOUNTER — Other Ambulatory Visit: Payer: 59

## 2023-09-02 ENCOUNTER — Emergency Department (HOSPITAL_COMMUNITY): Payer: 59

## 2023-09-02 ENCOUNTER — Observation Stay (HOSPITAL_COMMUNITY): Payer: 59

## 2023-09-02 ENCOUNTER — Other Ambulatory Visit: Payer: Self-pay

## 2023-09-02 ENCOUNTER — Inpatient Hospital Stay (HOSPITAL_COMMUNITY)
Admission: EM | Admit: 2023-09-02 | Discharge: 2023-09-04 | DRG: 379 | Disposition: A | Discharge status: Home / Self Care | Payer: 59 | Attending: Internal Medicine | Admitting: Internal Medicine

## 2023-09-02 ENCOUNTER — Encounter (HOSPITAL_COMMUNITY): Payer: Self-pay | Admitting: Radiology

## 2023-09-02 DIAGNOSIS — R519 Headache, unspecified: Secondary | ICD-10-CM | POA: Diagnosis not present

## 2023-09-02 DIAGNOSIS — R110 Nausea: Secondary | ICD-10-CM | POA: Diagnosis not present

## 2023-09-02 DIAGNOSIS — G43909 Migraine, unspecified, not intractable, without status migrainosus: Secondary | ICD-10-CM | POA: Insufficient documentation

## 2023-09-02 DIAGNOSIS — I10 Essential (primary) hypertension: Secondary | ICD-10-CM | POA: Diagnosis present

## 2023-09-02 DIAGNOSIS — K2961 Other gastritis with bleeding: Secondary | ICD-10-CM | POA: Diagnosis not present

## 2023-09-02 DIAGNOSIS — Z885 Allergy status to narcotic agent status: Secondary | ICD-10-CM

## 2023-09-02 DIAGNOSIS — G8929 Other chronic pain: Secondary | ICD-10-CM | POA: Diagnosis present

## 2023-09-02 DIAGNOSIS — Z8249 Family history of ischemic heart disease and other diseases of the circulatory system: Secondary | ICD-10-CM

## 2023-09-02 DIAGNOSIS — K3189 Other diseases of stomach and duodenum: Secondary | ICD-10-CM | POA: Diagnosis present

## 2023-09-02 DIAGNOSIS — F419 Anxiety disorder, unspecified: Secondary | ICD-10-CM | POA: Diagnosis present

## 2023-09-02 DIAGNOSIS — E860 Dehydration: Secondary | ICD-10-CM | POA: Diagnosis present

## 2023-09-02 DIAGNOSIS — E876 Hypokalemia: Secondary | ICD-10-CM | POA: Diagnosis not present

## 2023-09-02 DIAGNOSIS — R109 Unspecified abdominal pain: Secondary | ICD-10-CM | POA: Diagnosis not present

## 2023-09-02 DIAGNOSIS — Z79899 Other long term (current) drug therapy: Secondary | ICD-10-CM

## 2023-09-02 DIAGNOSIS — Z87891 Personal history of nicotine dependence: Secondary | ICD-10-CM

## 2023-09-02 DIAGNOSIS — K5900 Constipation, unspecified: Secondary | ICD-10-CM | POA: Diagnosis not present

## 2023-09-02 DIAGNOSIS — R112 Nausea with vomiting, unspecified: Principal | ICD-10-CM | POA: Diagnosis present

## 2023-09-02 DIAGNOSIS — Z91018 Allergy to other foods: Secondary | ICD-10-CM

## 2023-09-02 DIAGNOSIS — R079 Chest pain, unspecified: Secondary | ICD-10-CM | POA: Diagnosis not present

## 2023-09-02 DIAGNOSIS — K254 Chronic or unspecified gastric ulcer with hemorrhage: Secondary | ICD-10-CM | POA: Diagnosis present

## 2023-09-02 DIAGNOSIS — Z833 Family history of diabetes mellitus: Secondary | ICD-10-CM

## 2023-09-02 DIAGNOSIS — K219 Gastro-esophageal reflux disease without esophagitis: Secondary | ICD-10-CM | POA: Diagnosis present

## 2023-09-02 HISTORY — DX: Migraine, unspecified, not intractable, without status migrainosus: G43.909

## 2023-09-02 LAB — CBG MONITORING, ED: Glucose-Capillary: 116 mg/dL — ABNORMAL HIGH (ref 70–99)

## 2023-09-02 LAB — BASIC METABOLIC PANEL
Anion gap: 12 (ref 5–15)
BUN: 9 mg/dL (ref 6–20)
CO2: 26 mmol/L (ref 22–32)
Calcium: 9.2 mg/dL (ref 8.9–10.3)
Chloride: 98 mmol/L (ref 98–111)
Creatinine, Ser: 0.73 mg/dL (ref 0.44–1.00)
GFR, Estimated: >60 mL/min (ref >60)
Glucose, Bld: 107 mg/dL — ABNORMAL HIGH (ref 70–99)
Potassium: 2.9 mmol/L — ABNORMAL LOW (ref 3.5–5.1)
Sodium: 136 mmol/L (ref 135–145)

## 2023-09-02 LAB — CBC
HCT: 40.1 % (ref 36.0–46.0)
Hemoglobin: 14.1 g/dL (ref 12.0–15.0)
MCH: 32.3 pg (ref 26.0–34.0)
MCHC: 35.2 g/dL (ref 30.0–36.0)
MCV: 92 fL (ref 80.0–100.0)
Platelets: 268 10*3/uL (ref 150–400)
RBC: 4.36 MIL/uL (ref 3.87–5.11)
RDW: 10.8 % — ABNORMAL LOW (ref 11.5–15.5)
WBC: 10.7 10*3/uL — ABNORMAL HIGH (ref 4.0–10.5)
nRBC: 0 % (ref 0.0–0.2)

## 2023-09-02 LAB — TROPONIN I (HIGH SENSITIVITY)
Troponin I (High Sensitivity): 3 ng/L (ref <18)
Troponin I (High Sensitivity): 4 ng/L (ref <18)

## 2023-09-02 LAB — LIPASE, BLOOD: Lipase: 54 U/L — ABNORMAL HIGH (ref 11–51)

## 2023-09-02 LAB — MAGNESIUM: Magnesium: 1.9 mg/dL (ref 1.7–2.4)

## 2023-09-02 MED ORDER — KETOROLAC TROMETHAMINE 30 MG/ML IJ SOLN
30.0000 mg | Freq: Once | INTRAMUSCULAR | Status: AC
  Administered 2023-09-02: 30 mg via INTRAVENOUS
  Filled 2023-09-02: qty 1

## 2023-09-02 MED ORDER — RIMEGEPANT SULFATE 75 MG PO TBDP: 1.0000 | ORAL_TABLET | ORAL | Status: DC

## 2023-09-02 MED ORDER — PANTOPRAZOLE SODIUM 40 MG IV SOLR
40.0000 mg | Freq: Once | INTRAVENOUS | Status: AC
  Administered 2023-09-02: 40 mg via INTRAVENOUS
  Filled 2023-09-02: qty 10

## 2023-09-02 MED ORDER — POTASSIUM CHLORIDE 10 MEQ/100ML IV SOLN
10.0000 meq | INTRAVENOUS | Status: AC
Start: 2023-09-02 — End: 2023-09-02
  Administered 2023-09-02 (×2): 10 meq via INTRAVENOUS
  Filled 2023-09-02 (×2): qty 100

## 2023-09-02 MED ORDER — ACETAMINOPHEN 325 MG PO TABS: 650.0000 mg | ORAL_TABLET | Freq: Four times a day (QID) | ORAL | Status: DC | PRN

## 2023-09-02 MED ORDER — SUMATRIPTAN SUCCINATE 6 MG/0.5ML ~~LOC~~ SOLN: 6.0000 mg | SUBCUTANEOUS | Status: DC | PRN

## 2023-09-02 MED ORDER — METOCLOPRAMIDE HCL 5 MG/ML IJ SOLN
10.0000 mg | Freq: Once | INTRAMUSCULAR | Status: AC
Start: 2023-09-02 — End: 2023-09-02
  Administered 2023-09-02: 10 mg via INTRAVENOUS
  Filled 2023-09-02: qty 2

## 2023-09-02 MED ORDER — SODIUM CHLORIDE 0.9 % IV SOLN: INTRAVENOUS | Status: AC

## 2023-09-02 MED ORDER — ONDANSETRON HCL 4 MG/2ML IJ SOLN
4.0000 mg | Freq: Four times a day (QID) | INTRAMUSCULAR | Status: DC | PRN
  Administered 2023-09-02 – 2023-09-03 (×2): 4 mg via INTRAVENOUS
  Filled 2023-09-02 (×2): qty 2

## 2023-09-02 MED ORDER — GABAPENTIN 300 MG PO CAPS
300.0000 mg | ORAL_CAPSULE | Freq: Three times a day (TID) | ORAL | Status: DC
  Administered 2023-09-02 – 2023-09-04 (×5): 300 mg via ORAL
  Filled 2023-09-02 (×5): qty 1

## 2023-09-02 MED ORDER — ACETAMINOPHEN 650 MG RE SUPP: 650.0000 mg | Freq: Four times a day (QID) | RECTAL | Status: DC | PRN

## 2023-09-02 MED ORDER — POTASSIUM CHLORIDE CRYS ER 20 MEQ PO TBCR
40.0000 meq | EXTENDED_RELEASE_TABLET | Freq: Once | ORAL | Status: AC
  Administered 2023-09-02: 40 meq via ORAL
  Filled 2023-09-02: qty 2

## 2023-09-02 MED ORDER — BUSPIRONE HCL 5 MG PO TABS
10.0000 mg | ORAL_TABLET | Freq: Two times a day (BID) | ORAL | Status: DC
  Administered 2023-09-02 – 2023-09-04 (×3): 10 mg via ORAL
  Filled 2023-09-02 (×3): qty 2

## 2023-09-02 MED ORDER — ONDANSETRON HCL 4 MG/2ML IJ SOLN
4.0000 mg | Freq: Once | INTRAMUSCULAR | Status: AC
  Administered 2023-09-02: 4 mg via INTRAVENOUS
  Filled 2023-09-02: qty 2

## 2023-09-02 MED ORDER — DROPERIDOL 2.5 MG/ML IJ SOLN
1.2500 mg | Freq: Once | INTRAMUSCULAR | Status: AC
  Administered 2023-09-02: 1.25 mg via INTRAVENOUS
  Filled 2023-09-02: qty 2

## 2023-09-02 MED ORDER — PANTOPRAZOLE SODIUM 40 MG IV SOLR
40.0000 mg | INTRAVENOUS | Status: DC
  Administered 2023-09-03: 40 mg via INTRAVENOUS
  Filled 2023-09-02: qty 10

## 2023-09-02 MED ORDER — DIPHENHYDRAMINE HCL 50 MG/ML IJ SOLN
25.0000 mg | Freq: Once | INTRAMUSCULAR | Status: AC
Start: 2023-09-02 — End: 2023-09-02
  Administered 2023-09-02: 25 mg via INTRAVENOUS
  Filled 2023-09-02: qty 1

## 2023-09-02 MED ORDER — PANTOPRAZOLE SODIUM 40 MG PO TBEC: 40.0000 mg | DELAYED_RELEASE_TABLET | Freq: Every day | ORAL | Status: DC

## 2023-09-02 MED ORDER — TRAZODONE HCL 50 MG PO TABS
300.0000 mg | ORAL_TABLET | Freq: Every day | ORAL | Status: DC
  Administered 2023-09-02 – 2023-09-03 (×2): 300 mg via ORAL
  Filled 2023-09-02 (×2): qty 6

## 2023-09-02 MED ORDER — LORAZEPAM 2 MG/ML IJ SOLN
0.5000 mg | Freq: Once | INTRAMUSCULAR | Status: AC
  Administered 2023-09-02: 0.5 mg via INTRAVENOUS
  Filled 2023-09-02: qty 1

## 2023-09-02 NOTE — ED Notes (Signed)
Patient transported to CT 

## 2023-09-02 NOTE — ED Triage Notes (Signed)
Pt states this is the third time she has had to come this week. Pt has a severe migraine with nausea. Pt states she now has chest pain on the left side that goes up her neck and down her left arm. Pt also endorses weakness and lethargy at times finding it difficult to get her words out.  Provider Hyacinth Meeker to triage for a look at patient.

## 2023-09-02 NOTE — H&P (Signed)
TRH H&P   Patient Demographics:    [name], is a 42 y.o. female  MRN: [id number]   DOB - 1981/03/19  Admit Date - 09/02/2023  Outpatient Primary MD for the patient is [facility], [facility]  Referring MD/NP/PA: PA Tammy    Patient coming from: home  Chief Complaint  Patient presents with   Chest Pain   Migraine      HPI:    [name]  is a 42 y.o. female, with past medical history for anxiety, GERD, hypertension, migraine headaches, arthritis, patient presents to ED with multiple complaints, mainly nausea, vomiting and diffuse abdominal pain, will he did report single episode of chest pain, and he reports migraine, this is patient's third ED visit, she was here on 11/3, and 11/4, for nausea, vomiting and abdominal pain, CT abdomen pelvis with no acute findings, labs with no acute findings as well, she did report still no improvement, as well does report migraine, he is with known history of migraine, but reported has worsened recently, she does report single episode of chest pain, she thinks is likely related to vomiting, patient noted to be tachycardic, received fluid bolus while in ED, she had p.o. challenge after receiving multiple medications for nausea and vomiting, she still had vomiting after p.o. challenge, [facility] hospitalist consulted to admit -ED she was noted to be tachycardic, hypokalemia with potassium of 2.9, CT abdomen pelvis in 11/4 with no acute findings, her troponins were negative x 2, but EKG with some changing since 2019, ED physician discussed with GI, who recommended admission for endoscopy in a.m.    Review of systems:     A full 10 point Review of Systems was done, except as stated above, all other Review of Systems were negative.   With Past History of the following :    Past Medical History:  Diagnosis Date   Acid reflux    Anxiety     Arthritis    Environmental allergies    Hypertension    Migraine       Past Surgical History:  Procedure Laterality Date   arm surgery Left    plate in arm   BILATERAL SALPINGECTOMY     BONE BIOPSY Right    thumb   BRAIN SURGERY     evacuation of hematoma   CESAREAN SECTION     x2   CHOLECYSTECTOMY     ECTOPIC PREGNANCY SURGERY     x2   ENDOMETRIAL ABLATION  01/18/2018   Procedure: ENDOMETRIAL ABLATION WITH MINERVA;  Surgeon: Tilda Burrow, MD;  Location: [facility];  Service: Gynecology;;   ESOPHAGOGASTRODUODENOSCOPY (EGD) WITH PROPOFOL N/A 01/30/2014   Procedure: ESOPHAGOGASTRODUODENOSCOPY (EGD) WITH PROPOFOL;  Surgeon: West Bali, MD;  Location: [facility];  Service: Endoscopy;  Laterality: N/A;   HYSTEROSCOPY WITH D & C N/A 08/13/2015   Procedure: DILATATION AND CURETTAGE /HYSTEROSCOPY;  Surgeon: Tilda Burrow, MD;  Location: [facility];  Service: Gynecology;  Laterality: N/A;   HYSTEROSCOPY WITH D & C  01/18/2018   Procedure: CERVICAL DILATATION /HYSTEROSCOPY;  Surgeon: Tilda Burrow, MD;  Location: [facility];  Service: Gynecology;;   LEG SURGERY Left    femur rodding and removal of part of left femur   otif ankle Right       Social History:     Social History   Tobacco Use   Smoking status: Former    Current packs/day: 0.00    Average packs/day: 1 pack/day for 20.0 years (20.0 ttl pk-yrs)    Types: Cigarettes    Start date: 12/02/1991    Quit date: 12/02/2011    Years since quitting: 11.7   Smokeless tobacco: Never  Substance Use Topics   Alcohol use: No        Family History :     Family History  Problem Relation Age of Onset   Mental illness Mother    Diabetes Mother    Obesity Mother    Heart disease Father    Obesity Sister    Lupus Paternal Aunt    Cancer Maternal Grandfather        GASTRIC   Lupus Paternal Grandmother    Diabetes Son        boarderline   Arthritis Other    Cancer Other    Diabetes Other       Home Medications:   Prior  to Admission medications   Medication Sig Start Date End Date Taking? Authorizing Provider  busPIRone (BUSPAR) 10 MG tablet Take 10 mg by mouth 2 (two) times daily. 01/12/20  Yes [provider]  gabapentin (NEURONTIN) 300 MG capsule Take 300 mg by mouth 3 (three) times daily. 02/09/20  Yes [provider]  HYDROcodone-acetaminophen (NORCO) 10-325 MG per tablet Take 1 tablet by mouth every 6 (six) hours as needed for moderate pain.   Yes [provider]  metoCLOPramide (REGLAN) 5 MG tablet Take 1 tablet (5 mg total) by mouth every 6 (six) hours as needed for nausea. 08/29/23  Yes Sloan Leiter, DO  Multiple Vitamins-Minerals (MULTIVITAMIN ADULT) CHEW Chew 2 each by mouth daily.   Yes [provider]  pantoprazole (PROTONIX) 20 MG tablet Take 1 tablet (20 mg total) by mouth daily for 14 days. 08/29/23 09/12/23 Yes Sloan Leiter, DO  promethazine (PHENERGAN) 25 MG suppository Place 1 suppository (25 mg total) rectally every 6 (six) hours as needed for nausea or vomiting. 08/30/23  Yes Schutt, Edsel Petrin, PA-C  trazodone (DESYREL) 300 MG tablet Take 300 mg by mouth daily. 08/10/23  Yes [provider]  amoxicillin-clavulanate (AUGMENTIN) 875-125 MG tablet Take 1 tablet by mouth 2 (two) times daily. Patient not taking: Reported on 09/02/2023 08/17/23   [provider]  methylPREDNISolone (MEDROL DOSEPAK) 4 MG TBPK tablet Take 4 mg by mouth as directed. Patient not taking: Reported on 09/02/2023 08/17/23   [provider]  norethindrone (MICRONOR) 0.35 MG tablet Take 1 tablet (0.35 mg total) by mouth daily. Patient not taking: Reported on 09/02/2023 08/04/23 11/02/23  Diane Hidalgo, DO  NURTEC 75 MG TBDP Take 1 tablet by mouth every other day. Patient not taking: Reported on 09/02/2023 08/26/23   [provider]  sucralfate (CARAFATE) 1 g tablet Take 1 tablet (1 g total) by mouth 4 (four) times daily -  with meals and at bedtime for 4  days. Patient not taking: Reported on  09/02/2023 08/30/23 09/03/23  Michelle Piper, PA-C     Allergies:     Allergies  Allergen Reactions   Demerol Nausea Only   Orange Fruit [Citrus]     Fever blisters     Physical Exam:   Vitals  Blood pressure (!) 155/91, pulse 71, temperature 99.1 F (37.3 C), temperature source Oral, resp. rate 20, height 5\' 3"  (1.6 m), weight 57.6 kg, SpO2 97%.   1. General Well-developed female, laying in bed, in mild discomfort  2. Normal affect and insight, Not Suicidal or Homicidal, Awake Alert, Oriented X 3.  3. No F.N deficits, ALL C.Nerves Intact, Strength 5/5 all 4 extremities, Sensation intact all 4 extremities, Plantars down going.  4. Ears and Eyes appear Normal, Conjunctivae clear, PERRLA. Moist Oral Mucosa.  5. Supple Neck, No JVD, No cervical lymphadenopathy appriciated, No Carotid Bruits.  6. Symmetrical Chest wall movement, Good air movement bilaterally, CTAB.  7. RRR, No Gallops, Rubs or Murmurs, No Parasternal Heave.  8. Positive Bowel Sounds, Abdomen Soft, generalized tenderness,No rebound -guarding or rigidity.  9.  No Cyanosis, Normal Skin Turgor, No Skin Rash or Bruise.  10. Good muscle tone,  joints appear normal , no effusions, Normal ROM.     Data Review:    CBC Recent Labs  Lab 08/29/23 2101 08/30/23 1641 09/02/23 1314  WBC 12.0* 10.4 10.7*  HGB 13.9 14.1 14.1  HCT 40.7 41.0 40.1  PLT 258 229 268  MCV 94.0 94.5 92.0  MCH 32.1 32.5 32.3  MCHC 34.2 34.4 35.2  RDW 11.1* 11.3* 10.8*  LYMPHSABS  --  1.9  --   MONOABS  --  1.2*  --   EOSABS  --  0.0  --   BASOSABS  --  0.0  --    ------------------------------------------------------------------------------------------------------------------  Chemistries  Recent Labs  Lab 08/29/23 2101 08/30/23 1803 09/02/23 1314 09/02/23 1454  NA 135 137 136  --   K 3.6 3.2* 2.9*  --   CL 101 107 98  --   CO2 23 21* 26  --   GLUCOSE 122* 105* 107*  --   BUN  11 7 9   --   CREATININE 0.72 0.62 0.73  --   CALCIUM 9.2 7.9* 9.2  --   MG  --   --   --  1.9  AST 21 46*  --   --   ALT 21 46*  --   --   ALKPHOS 31* 28*  --   --   BILITOT 0.6 0.9  --   --    ------------------------------------------------------------------------------------------------------------------ estimated creatinine clearance is 75 mL/min (by C-G formula based on SCr of 0.73 mg/dL). ------------------------------------------------------------------------------------------------------------------ No results for input(s): "TSH", "T4TOTAL", "T3FREE", "THYROIDAB" in the last 72 hours.  Invalid input(s): "FREET3"  Coagulation profile No results for input(s): "INR", "PROTIME" in the last 168 hours. ------------------------------------------------------------------------------------------------------------------- No results for input(s): "DDIMER" in the last 72 hours. -------------------------------------------------------------------------------------------------------------------  Cardiac Enzymes No results for input(s): "CKMB", "TROPONINI", "MYOGLOBIN" in the last 168 hours.  Invalid input(s): "CK" ------------------------------------------------------------------------------------------------------------------ No results found for: "BNP"   ---------------------------------------------------------------------------------------------------------------  Urinalysis    Component Value Date/Time   COLORURINE YELLOW 08/29/2023 0315   APPEARANCEUR CLEAR 08/29/2023 0315   LABSPEC 1.028 08/29/2023 0315   PHURINE 5.0 08/29/2023 0315   GLUCOSEU NEGATIVE 08/29/2023 0315   HGBUR SMALL (A) 08/29/2023 0315   BILIRUBINUR NEGATIVE 08/29/2023 0315   KETONESUR 80 (A) 08/29/2023 0315   PROTEINUR 100 (A) 08/29/2023 0315   UROBILINOGEN 0.2  08/09/2015 0846   NITRITE NEGATIVE 08/29/2023 0315   LEUKOCYTESUR NEGATIVE 08/29/2023 0315     ----------------------------------------------------------------------------------------------------------------   Imaging Results:    DG Abd Portable 1 View  Result Date: 09/02/2023 CLINICAL DATA:  Abdominal pain, nausea, constipation EXAM: PORTABLE ABDOMEN - 1 VIEW COMPARISON:  08/30/2023 FINDINGS: Supine frontal view of the abdomen and pelvis demonstrates an unremarkable bowel gas pattern. No bowel obstruction or ileus. No significant fecal retention. No masses or abnormal calcifications. No acute bony abnormalities. IMPRESSION: 1. Unremarkable bowel gas pattern. Electronically Signed   By: Sharlet Salina M.D.   On: 09/02/2023 17:03   DG Chest 2 View  Result Date: 09/02/2023 CLINICAL DATA:  Chest pain. EXAM: CHEST - 2 VIEW COMPARISON:  03/18/2010. FINDINGS: Bilateral lung fields are clear. Bilateral costophrenic angles are clear. Normal cardio-mediastinal silhouette. No acute osseous abnormalities. The soft tissues are within normal limits. IMPRESSION: *No active cardiopulmonary disease. Electronically Signed   By: Jules Schick M.D.   On: 09/02/2023 15:02     EKG:  Vent. rate 116 BPM PR interval 118 ms QRS duration 80 ms QT/QTcB 328/455 ms P-R-T axes 71 90 -21 Sinus tachycardia Right atrial enlargement Rightward axis Possible Anterior infarct , age undetermined Marked ST abnormality, possible inferior subendocardial injury Abnormal ECG When compared with ECG of 17-Jan-2018 08:19, ST now depressed in Inferior leads T wave inversion now evident in Inferior leads Nonspecific T wave abnormality now evident in Lateral leads ...   Assessment & Plan:    Principal Problem:   Intractable nausea and vomiting Active Problems:   Chronic pain   Chronic anxiety   Hypertension   Migraine   Chest pain  Intractable nausea and vomiting -So far no clear etiology, this can be related to her migraine -CT abdomen pelvis with no acute finding 11/4 -Similar presentation in 2015,  endoscopy was significant for erosive gastritis, started on IV Protonix -Will keep on clear liquid diet, n.p.o. after midnight in anticipation for endoscopy as ED physician discussed with GI Dr. Tasia Catchings -20 with as needed Zofran and Reglan  Chest pain -Appears to be not typical, most likely in the setting of her nausea and vomiting, troponins negative x 2, but given some EKG changes from 2019, obtain 2D echo and monitor her on telemetry.    Migraine -Nurtec in none formulary, meanwhile we will continue with gabapentin -CT head is pending -Add Imitrex as needed  Hypokalemia -Due to nausea and vomiting, repleted  Chronic anxiety -Continue home medications   DVT Prophylaxis SCDs   AM Labs Ordered, also please review Full Orders  Family Communication: Admission, patients condition and plan of care including tests being ordered have been discussed with the patient and husband at bedside who indicate understanding and agree with the plan and Code Status.  Code Status full code  Likely DC to home  Condition GUARDED  Consults called: GI  Admission status: Observation  Time spent in minutes : 70 minutes   Huey Bienenstock M.D on 09/02/2023 at 6:49 PM   [facility] - Office  [phone]

## 2023-09-02 NOTE — ED Notes (Signed)
ED TO INPATIENT HANDOFF REPORT  ED Nurse Name and Phone #: [phone]  S Name/Age/Gender [name] 42 y.o. female Room/Bed: APA07/APA07  Code Status   Code Status: Prior  Home/SNF/Other Home Patient oriented to: self, place, time, and situation Is this baseline? Yes   Triage Complete: Triage complete  Chief Complaint Intractable nausea and vomiting [R11.2]  Triage Note Pt states this is the third time she has had to come this week. Pt has a severe migraine with nausea. Pt states she now has chest pain on the left side that goes up her neck and down her left arm. Pt also endorses weakness and lethargy at times finding it difficult to get her words out.  Provider Hyacinth Meeker to triage for a look at patient.    Allergies Allergies  Allergen Reactions   Demerol Nausea Only   Orange Fruit [Citrus]     Fever blisters    Level of Care/Admitting Diagnosis ED Disposition     ED Disposition  Admit   Condition  --   Comment  Hospital Area: [facility] [[id number]]  Level of Care: Telemetry [5]  Covid Evaluation: Asymptomatic - no recent exposure (last 10 days) testing not required  Diagnosis: Intractable nausea and vomiting [720114]  Admitting Physician: Chiquita Loth  Attending Physician: Randol Kern, DAWOOD S [[id number]]          B Medical/Surgery History Past Medical History:  Diagnosis Date   Acid reflux    Anxiety    Arthritis    Environmental allergies    Hypertension    Migraine    Past Surgical History:  Procedure Laterality Date   arm surgery Left    plate in arm   BILATERAL SALPINGECTOMY     BONE BIOPSY Right    thumb   BRAIN SURGERY     evacuation of hematoma   CESAREAN SECTION     x2   CHOLECYSTECTOMY     ECTOPIC PREGNANCY SURGERY     x2   ENDOMETRIAL ABLATION  01/18/2018   Procedure: ENDOMETRIAL ABLATION WITH MINERVA;  Surgeon: Tilda Burrow, MD;  Location: [facility];  Service: Gynecology;;    ESOPHAGOGASTRODUODENOSCOPY (EGD) WITH PROPOFOL N/A 01/30/2014   Procedure: ESOPHAGOGASTRODUODENOSCOPY (EGD) WITH PROPOFOL;  Surgeon: West Bali, MD;  Location: [facility];  Service: Endoscopy;  Laterality: N/A;   HYSTEROSCOPY WITH D & C N/A 08/13/2015   Procedure: DILATATION AND CURETTAGE /HYSTEROSCOPY;  Surgeon: Tilda Burrow, MD;  Location: [facility];  Service: Gynecology;  Laterality: N/A;   HYSTEROSCOPY WITH D & C  01/18/2018   Procedure: CERVICAL DILATATION /HYSTEROSCOPY;  Surgeon: Tilda Burrow, MD;  Location: [facility];  Service: Gynecology;;   LEG SURGERY Left    femur rodding and removal of part of left femur   otif ankle Right      A IV Location/Drains/Wounds Patient Lines/Drains/Airways Status     Active Line/Drains/Airways     Name Placement date Placement time Site Days   Peripheral IV 09/02/23 20 G 1" Anterior;Proximal;Right Forearm 09/02/23  1330  Forearm  less than 1            Intake/Output Last 24 hours  Intake/Output Summary (Last 24 hours) at 09/02/2023 1817 Last data filed at 09/02/2023 1726 Gross per 24 hour  Intake 100 ml  Output --  Net 100 ml    Labs/Imaging Results for orders placed or performed during the hospital encounter of 09/02/23 (from the past 48 hour(s))  CBG monitoring,  ED     Status: Abnormal   Collection Time: 09/02/23 12:45 PM  Result Value Ref Range   Glucose-Capillary 116 (H) 70 - 99 mg/dL    Comment: Glucose reference range applies only to samples taken after fasting for at least 8 hours.  Basic metabolic panel     Status: Abnormal   Collection Time: 09/02/23  1:14 PM  Result Value Ref Range   Sodium 136 135 - 145 mmol/L   Potassium 2.9 (L) 3.5 - 5.1 mmol/L   Chloride 98 98 - 111 mmol/L   CO2 26 22 - 32 mmol/L   Glucose, Bld 107 (H) 70 - 99 mg/dL    Comment: Glucose reference range applies only to samples taken after fasting for at least 8 hours.   BUN 9 6 - 20 mg/dL   Creatinine, Ser 1.30 0.44 - 1.00 mg/dL   Calcium 9.2 8.9 -  86.5 mg/dL   GFR, Estimated >78 >46 mL/min    Comment: (NOTE) Calculated using the CKD-EPI Creatinine Equation (2021)    Anion gap 12 5 - 15    Comment: Performed at [facility], [address], [place], [place] [address]  CBC     Status: Abnormal   Collection Time: 09/02/23  1:14 PM  Result Value Ref Range   WBC 10.7 (H) 4.0 - 10.5 K/uL   RBC 4.36 3.87 - 5.11 MIL/uL   Hemoglobin 14.1 12.0 - 15.0 g/dL   HCT 28.4 13.2 - 44.0 %   MCV 92.0 80.0 - 100.0 fL   MCH 32.3 26.0 - 34.0 pg   MCHC 35.2 30.0 - 36.0 g/dL   RDW 10.2 (L) 72.5 - 36.6 %   Platelets 268 150 - 400 K/uL   nRBC 0.0 0.0 - 0.2 %    Comment: Performed at [facility], [address], [place], [place] [address]  Troponin I (High Sensitivity)     Status: None   Collection Time: 09/02/23  1:14 PM  Result Value Ref Range   Troponin I (High Sensitivity) 3 <18 ng/L    Comment: (NOTE) Elevated high sensitivity troponin I (hsTnI) values and significant  changes across serial measurements may suggest ACS but many other  chronic and acute conditions are known to elevate hsTnI results.  Refer to the "Links" section for chest pain algorithms and additional  guidance. Performed at [facility], [address] [place], [place] [address]   Troponin I (High Sensitivity)     Status: None   Collection Time: 09/02/23  2:54 PM  Result Value Ref Range   Troponin I (High Sensitivity) 4 <18 ng/L    Comment: (NOTE) Elevated high sensitivity troponin I (hsTnI) values and significant  changes across serial measurements may suggest ACS but many other  chronic and acute conditions are known to elevate hsTnI results.  Refer to the "Links" section for chest pain algorithms and additional  guidance. Performed at [facility], [address] [place], [place] [address]   Magnesium     Status: None   Collection Time: 09/02/23  2:54 PM  Result Value Ref Range   Magnesium 1.9 1.7 - 2.4 mg/dL    Comment: Performed at [facility], [address], [place], [place] [address]  Lipase, blood     Status: Abnormal   Collection Time: 09/02/23  2:54 PM  Result Value Ref Range   Lipase 54 (H) 11 - 51 U/L    Comment: Performed at [facility], [address] [place], [place] [address]  DG Abd Portable 1 View  Result Date: 09/02/2023 CLINICAL DATA:  Abdominal pain, nausea, constipation EXAM: PORTABLE ABDOMEN - 1 VIEW COMPARISON:  08/30/2023 FINDINGS: Supine frontal view of the abdomen and pelvis demonstrates an unremarkable bowel gas pattern. No bowel obstruction or ileus. No significant fecal retention. No masses or abnormal calcifications. No acute bony abnormalities. IMPRESSION: 1. Unremarkable bowel gas pattern. Electronically Signed   By: Sharlet Salina M.D.   On: 09/02/2023 17:03   DG Chest 2 View  Result Date: 09/02/2023 CLINICAL DATA:  Chest pain. EXAM: CHEST - 2 VIEW COMPARISON:  03/18/2010. FINDINGS: Bilateral lung fields are clear. Bilateral costophrenic angles are clear. Normal cardio-mediastinal silhouette. No acute osseous abnormalities. The soft tissues are within normal limits. IMPRESSION: *No active cardiopulmonary disease. Electronically Signed   By: Jules Schick M.D.   On: 09/02/2023 15:02    Pending Labs Unresulted Labs (From admission, onward)    None       Vitals/Pain Today's Vitals   09/02/23 1401 09/02/23 1630 09/02/23 1703 09/02/23 1800  BP:  (!) 157/91  (!) 155/91  Pulse:  64  71  Resp:  14  20  Temp:   99.1 F (37.3 C)   TempSrc:   Oral   SpO2:  97%  97%  Weight:      Height:      PainSc: 9        Isolation Precautions No active isolations  Medications Medications  0.9 %  sodium chloride infusion ( Intravenous Bolus 09/02/23 1333)  metoCLOPramide (REGLAN) injection 10 mg (10 mg Intravenous Given 09/02/23 1330)  diphenhydrAMINE (BENADRYL) injection 25 mg (25 mg Intravenous Given 09/02/23 1330)  ketorolac (TORADOL) 30 MG/ML injection 30 mg (30 mg Intravenous Given 09/02/23 1333)  ondansetron (ZOFRAN)  injection 4 mg (4 mg Intravenous Given 09/02/23 1439)  LORazepam (ATIVAN) injection 0.5 mg (0.5 mg Intravenous Given 09/02/23 1440)  potassium chloride 10 mEq in 100 mL IVPB (0 mEq Intravenous Stopped 09/02/23 1726)  potassium chloride SA (KLOR-CON M) CR tablet 40 mEq (40 mEq Oral Given 09/02/23 1449)  pantoprazole (PROTONIX) injection 40 mg (40 mg Intravenous Given 09/02/23 1610)  droperidol (INAPSINE) 2.5 MG/ML injection 1.25 mg (1.25 mg Intravenous Given 09/02/23 1705)    Mobility walks     Focused Assessments Cardiac Assessment Handoff:  Cardiac Rhythm: Normal sinus rhythm Lab Results  Component Value Date   CKTOTAL 146 03/19/2010   CKMB 1.2 03/19/2010   TROPONINI <0.01        NO INDICATION OF MYOCARDIAL INJURY. 03/19/2010   No results found for: "DDIMER" Does the Patient currently have chest pain? No    R Recommendations: See Admitting Provider Note  Report given to:   Additional Notes: pt is having severe abdominal pain unsure of cause, will ambulate to restroom and back to room.

## 2023-09-02 NOTE — ED Provider Triage Note (Signed)
Emergency Medicine Provider Triage Evaluation Note  [name] , a 42 y.o. female  was evaluated in triage.  Pt complains of headache, diffuse weakness, persistent nausea and vomiting, feeling weak and dehydrated  Review of Systems  Positive: Weak, diffusely Negative: Fevers, chills, abdominal pain  Physical Exam  BP (!) 153/82   Pulse (!) 124   Temp 97.8 F (36.6 C) (Oral)   Resp 18   Ht 1.6 m (5\' 3" )   Wt 57.6 kg   SpO2 97%   BMI 22.50 kg/m  Gen:   Awake, no distress anxiety Resp:  Normal effort no abnormal findings on lung exam MSK:   Moves extremities without difficulty no deformities edema or swelling Other:  Neurologically this patient has no facial droop and normal speech, when she starts to talk about her speech being abnormal she starts to stutter, when she is answering other questions she has no stuttering, when I ask her to do certain movements with her arms and legs she has totally normal facial symmetry including grimace, when I asked her to do a intentional grimace she only uses the left side of her face but has right-sided facial droop.  Medical Decision Making  Medically screening exam initiated at 12:59 PM.  Appropriate orders placed.  [name] was informed that the remainder of the evaluation will be completed by another provider, this initial triage assessment does not replace that evaluation, and the importance of remaining in the ED until their evaluation is complete.  Likely migraine related, anxiety related, could be hypokalemic, check labs, fluids, headache cocktail, doubt stroke   Eber Hong, MD 09/02/23 1300

## 2023-09-02 NOTE — ED Notes (Signed)
Pt failed fluid challenge.

## 2023-09-02 NOTE — ED Provider Notes (Signed)
[place] EMERGENCY DEPARTMENT AT [facility] Provider Note   CSN: [id number] Arrival date & time: 09/02/23  1220     History  Chief Complaint  Patient presents with   Chest Pain   Migraine    [name] is a 42 y.o. female.   Chest Pain Associated symptoms: abdominal pain, headache, nausea, vomiting and weakness   Associated symptoms: no back pain, no cough, no dizziness, no fever and no shortness of breath   Migraine Associated symptoms include chest pain, abdominal pain and headaches. Pertinent negatives include no shortness of breath.       [name] is a 42 y.o. female with past medical history of anxiety, acid reflux, hypertension migraine headaches and arthritis who presents to the Emergency Department complaining of recurrent nausea vomiting and diffuse abdominal pain.  This is her third ER visit for the same.  She was here on 11/3 and 11/4 for nausea vomiting and abdominal pain.  States her nausea vomiting and abdominal pain returned shortly after returning home from her last visit.  Endorses multiple episodes of vomiting, unable to keep down any liquids or food.  Endorses significant weight loss over the last week.  Having some pains of her upper chest today along with migraine headache.  States she has history of migraines describes throbbing pounding pain to the front of her head and into her temples.  Feels like she is having some speech difficulties and tingling sensations of her right face.  No prior history of stroke.  No extremity weakness. she denies any shortness of breath, fever, hematemesis. history of prior cholecystectomy    Home Medications Prior to Admission medications   Medication Sig Start Date End Date Taking? Authorizing Provider  busPIRone (BUSPAR) 10 MG tablet Take 10 mg by mouth 2 (two) times daily. 01/12/20   [provider]  gabapentin (NEURONTIN) 300 MG capsule Take 300 mg by mouth 3 (three) times daily.  02/09/20   [provider]  HYDROcodone-acetaminophen (NORCO) 10-325 MG per tablet Take 1 tablet by mouth every 6 (six) hours as needed for moderate pain.    [provider]  metoCLOPramide (REGLAN) 5 MG tablet Take 1 tablet (5 mg total) by mouth every 6 (six) hours as needed for nausea. 08/29/23   Sloan Leiter, DO  Multiple Vitamins-Minerals (MULTIVITAMIN ADULT) CHEW Chew 2 each by mouth daily.    [provider]  norethindrone (MICRONOR) 0.35 MG tablet Take 1 tablet (0.35 mg total) by mouth daily. 08/04/23 11/02/23  Myna Hidalgo, DO  pantoprazole (PROTONIX) 20 MG tablet Take 1 tablet (20 mg total) by mouth daily for 14 days. 08/29/23 09/12/23  Sloan Leiter, DO  promethazine (PHENERGAN) 25 MG suppository Place 1 suppository (25 mg total) rectally every 6 (six) hours as needed for nausea or vomiting. 08/30/23   Schutt, Edsel Petrin, PA-C  sucralfate (CARAFATE) 1 g tablet Take 1 tablet (1 g total) by mouth 4 (four) times daily -  with meals and at bedtime for 4 days. 08/30/23 09/03/23  Schutt, Edsel Petrin, PA-C  traZODone (DESYREL) 100 MG tablet Take 100 mg by mouth at bedtime.    [provider]      Allergies    Demerol and Orange fruit [citrus]    Review of Systems   Review of Systems  Constitutional:  Positive for appetite change. Negative for chills and fever.  Respiratory:  Negative for cough and shortness of breath.   Cardiovascular:  Positive for chest  pain.  Gastrointestinal:  Positive for abdominal pain, constipation, nausea and vomiting. Negative for diarrhea.  Genitourinary:  Negative for dysuria.  Musculoskeletal:  Negative for back pain, neck pain and neck stiffness.  Skin:  Negative for color change and wound.  Neurological:  Positive for weakness and headaches. Negative for dizziness, syncope and speech difficulty.    Physical Exam Updated Vital Signs BP (!) 153/82   Pulse (!) 124   Temp 97.8 F (36.6 C) (Oral)   Resp 18   Ht 5\' 3"  (1.6  m)   Wt 57.6 kg   SpO2 97%   BMI 22.50 kg/m  Physical Exam Vitals and nursing note reviewed.  Constitutional:      General: She is not in acute distress.    Appearance: She is not toxic-appearing.  HENT:     Head: Atraumatic.     Mouth/Throat:     Mouth: Mucous membranes are dry.  Eyes:     Extraocular Movements: Extraocular movements intact.     Conjunctiva/sclera: Conjunctivae normal.     Pupils: Pupils are equal, round, and reactive to light.  Cardiovascular:     Rate and Rhythm: Normal rate and regular rhythm.     Pulses: Normal pulses.  Pulmonary:     Effort: Pulmonary effort is normal.  Abdominal:     General: There is no distension.     Palpations: Abdomen is soft.     Tenderness: There is abdominal tenderness. There is no guarding.     Comments: Generalized tenderness of the abdomen.  Abdomen is soft.  No distention no guarding or rebound tenderness.  Musculoskeletal:        General: Normal range of motion.     Cervical back: Normal range of motion.  Skin:    General: Skin is warm.     Capillary Refill: Capillary refill takes less than 2 seconds.  Neurological:     General: No focal deficit present.     Mental Status: She is alert and oriented to person, place, and time.     GCS: GCS eye subscore is 4. GCS verbal subscore is 5. GCS motor subscore is 6.     Cranial Nerves: Cranial nerves 2-12 are intact.     Sensory: Sensation is intact. No sensory deficit.     Motor: Motor function is intact. No weakness, tremor or pronator drift.     Comments: CN II through XII intact.  Speech clear.  No facial droop.  No pronator drift normal finger-nose testing     ED Results / Procedures / Treatments   Labs (all labs ordered are listed, but only abnormal results are displayed) Labs Reviewed  BASIC METABOLIC PANEL - Abnormal; Notable for the following components:      Result Value   Potassium 2.9 (*)    Glucose, Bld 107 (*)    All other components within normal limits   CBC - Abnormal; Notable for the following components:   WBC 10.7 (*)    RDW 10.8 (*)    All other components within normal limits  CBG MONITORING, ED - Abnormal; Notable for the following components:   Glucose-Capillary 116 (*)    All other components within normal limits  MAGNESIUM  LIPASE, BLOOD  TROPONIN I (HIGH SENSITIVITY)  TROPONIN I (HIGH SENSITIVITY)    EKG EKG Interpretation Date/Time:  Thursday September 02 2023 12:51:40 EST Ventricular Rate:  116 PR Interval:  118 QRS Duration:  80 QT Interval:  328 QTC Calculation: 455 R Axis:  90  Text Interpretation: Sinus tachycardia Right atrial enlargement Rightward axis Possible Anterior infarct , age undetermined Marked ST abnormality, possible inferior subendocardial injury Abnormal ECG When compared with ECG of 17-Jan-2018 08:19, ST now depressed in Inferior leads T wave inversion now evident in Inferior leads Nonspecific T wave abnormality now evident in Lateral leads Confirmed by Eber Hong ([id number]) on 09/02/2023 1:33:13 PM  Radiology DG Abd Portable 1 View  Result Date: 09/02/2023 CLINICAL DATA:  Abdominal pain, nausea, constipation EXAM: PORTABLE ABDOMEN - 1 VIEW COMPARISON:  08/30/2023 FINDINGS: Supine frontal view of the abdomen and pelvis demonstrates an unremarkable bowel gas pattern. No bowel obstruction or ileus. No significant fecal retention. No masses or abnormal calcifications. No acute bony abnormalities. IMPRESSION: 1. Unremarkable bowel gas pattern. Electronically Signed   By: Sharlet Salina M.D.   On: 09/02/2023 17:03   DG Chest 2 View  Result Date: 09/02/2023 CLINICAL DATA:  Chest pain. EXAM: CHEST - 2 VIEW COMPARISON:  03/18/2010. FINDINGS: Bilateral lung fields are clear. Bilateral costophrenic angles are clear. Normal cardio-mediastinal silhouette. No acute osseous abnormalities. The soft tissues are within normal limits. IMPRESSION: *No active cardiopulmonary disease. Electronically Signed   By: Jules Schick M.D.   On: 09/02/2023 15:02    Procedures Procedures    Medications Ordered in ED Medications  0.9 %  sodium chloride infusion ( Intravenous Bolus 09/02/23 1333)  metoCLOPramide (REGLAN) injection 10 mg (10 mg Intravenous Given 09/02/23 1330)  diphenhydrAMINE (BENADRYL) injection 25 mg (25 mg Intravenous Given 09/02/23 1330)  ketorolac (TORADOL) 30 MG/ML injection 30 mg (30 mg Intravenous Given 09/02/23 1333)  ondansetron (ZOFRAN) injection 4 mg (4 mg Intravenous Given 09/02/23 1439)  LORazepam (ATIVAN) injection 0.5 mg (0.5 mg Intravenous Given 09/02/23 1440)  potassium chloride 10 mEq in 100 mL IVPB (0 mEq Intravenous Stopped 09/02/23 1726)  potassium chloride SA (KLOR-CON M) CR tablet 40 mEq (40 mEq Oral Given 09/02/23 1449)  pantoprazole (PROTONIX) injection 40 mg (40 mg Intravenous Given 09/02/23 1610)  droperidol (INAPSINE) 2.5 MG/ML injection 1.25 mg (1.25 mg Intravenous Given 09/02/23 1705)    ED Course/ Medical Decision Making/ A&P                                 Medical Decision Making Patient here with continued abdominal pain nausea and vomiting.  Has been unable to tolerate foods or liquids at home endorses significant weight loss over the last week to 2 weeks.  This is her third ER visit for same.  Anxious appearing on arrival.  Diffuse tenderness on abdominal exam.  Denies bowel movement in several days  Had CT scan of abdomen and pelvis on 08/30/2023 without acute findings.  Taking antiemetics at home as well as PPI  Differential includes esophagitis, dehydration, peptic ulcer H. pylori, gastroparesis  Amount and/or Complexity of Data Reviewed External Data Reviewed: notes.    Details: On of medical records, patient had CT abdomen and pelvis on 08/30/2023 without acute finding.  She had CT head in August of this year ordered by PCP without acute intracranial finding. Labs: ordered.    Details: Labs interpreted by me, white count without significant cytosis, chemistries  show hypokalemia with potassium of 2.9 it was 3.2  3 days ago.  Delta troponin remains flat, lipase mildly elevated at 54 Radiology: ordered.    Details: X-ray of the chest without acute cardiopulmonary process.  1 view abdomen with unremarkable bowel gas  pattern. ECG/medicine tests: ordered.    Details: EKG shows sinus tachycardia right atrial enlargement right axis Discussion of management or test interpretation with external provider(s): Patient initially anxious appearing when examined, complained of headache and chest pain along with speech difficulties. Patient has reassuring neurologic exam, do not appreciate any slurred speech or facial droop.  No unilateral extremity weakness.  Does appear anxious and tearful on arrival.  Cardiac workup reassuring.  Patient likely with atypical migraine on arrival improved after migraine cocktail.  No indication for TIA stroke Had CT abdomen and pelvis 3 days ago without acute findings no indication for need of repeat imaging of the abdomen pelvis today.  Patient continues to have nausea and has required IV fluids PPI and multiple antiemetics.  I have consulted with GI, Dr. Tasia Catchings who request patient be admitted to hospitalist service, n.p.o. after midnight with plans for endoscopy tomorrow  Discussed findings with [facility] hospitalist, Dr. Randol Kern who agrees to admit    Risk Prescription drug management.           Final Clinical Impression(s) / ED Diagnoses Final diagnoses:  Intractable nausea and vomiting  Hypokalemia    Rx / DC Orders ED Discharge Orders     None         Pauline Aus, PA-C 09/02/23 1807    Eber Hong, MD 09/03/23 [phone]

## 2023-09-03 ENCOUNTER — Encounter (HOSPITAL_COMMUNITY)
Admission: EM | Disposition: A | Discharge status: Home / Self Care | Payer: Self-pay | Source: Home / Self Care | Attending: Internal Medicine

## 2023-09-03 ENCOUNTER — Observation Stay (HOSPITAL_COMMUNITY): Payer: 59 | Admitting: Certified Registered"

## 2023-09-03 ENCOUNTER — Encounter (HOSPITAL_COMMUNITY): Payer: Self-pay | Admitting: Internal Medicine

## 2023-09-03 DIAGNOSIS — K254 Chronic or unspecified gastric ulcer with hemorrhage: Secondary | ICD-10-CM

## 2023-09-03 DIAGNOSIS — G43909 Migraine, unspecified, not intractable, without status migrainosus: Secondary | ICD-10-CM | POA: Diagnosis not present

## 2023-09-03 DIAGNOSIS — G8929 Other chronic pain: Secondary | ICD-10-CM | POA: Diagnosis not present

## 2023-09-03 DIAGNOSIS — R1010 Upper abdominal pain, unspecified: Secondary | ICD-10-CM

## 2023-09-03 DIAGNOSIS — Z8249 Family history of ischemic heart disease and other diseases of the circulatory system: Secondary | ICD-10-CM | POA: Diagnosis not present

## 2023-09-03 DIAGNOSIS — E860 Dehydration: Secondary | ICD-10-CM | POA: Diagnosis not present

## 2023-09-03 DIAGNOSIS — Z79899 Other long term (current) drug therapy: Secondary | ICD-10-CM | POA: Diagnosis not present

## 2023-09-03 DIAGNOSIS — Z885 Allergy status to narcotic agent status: Secondary | ICD-10-CM | POA: Diagnosis not present

## 2023-09-03 DIAGNOSIS — I10 Essential (primary) hypertension: Secondary | ICD-10-CM

## 2023-09-03 DIAGNOSIS — Z833 Family history of diabetes mellitus: Secondary | ICD-10-CM | POA: Diagnosis not present

## 2023-09-03 DIAGNOSIS — Z91018 Allergy to other foods: Secondary | ICD-10-CM | POA: Diagnosis not present

## 2023-09-03 DIAGNOSIS — F419 Anxiety disorder, unspecified: Secondary | ICD-10-CM | POA: Diagnosis not present

## 2023-09-03 DIAGNOSIS — E876 Hypokalemia: Secondary | ICD-10-CM

## 2023-09-03 DIAGNOSIS — K2961 Other gastritis with bleeding: Secondary | ICD-10-CM | POA: Diagnosis not present

## 2023-09-03 DIAGNOSIS — R079 Chest pain, unspecified: Secondary | ICD-10-CM | POA: Diagnosis not present

## 2023-09-03 DIAGNOSIS — K922 Gastrointestinal hemorrhage, unspecified: Secondary | ICD-10-CM | POA: Diagnosis not present

## 2023-09-03 DIAGNOSIS — K3189 Other diseases of stomach and duodenum: Secondary | ICD-10-CM | POA: Diagnosis present

## 2023-09-03 DIAGNOSIS — K219 Gastro-esophageal reflux disease without esophagitis: Secondary | ICD-10-CM | POA: Diagnosis not present

## 2023-09-03 DIAGNOSIS — R102 Pelvic and perineal pain: Secondary | ICD-10-CM | POA: Diagnosis not present

## 2023-09-03 DIAGNOSIS — Z87891 Personal history of nicotine dependence: Secondary | ICD-10-CM | POA: Diagnosis not present

## 2023-09-03 DIAGNOSIS — R112 Nausea with vomiting, unspecified: Secondary | ICD-10-CM | POA: Diagnosis not present

## 2023-09-03 HISTORY — PX: ESOPHAGOGASTRODUODENOSCOPY (EGD) WITH PROPOFOL: SHX5813

## 2023-09-03 HISTORY — PX: BIOPSY: SHX5522

## 2023-09-03 LAB — BASIC METABOLIC PANEL
Anion gap: 8 (ref 5–15)
BUN: 7 mg/dL (ref 6–20)
CO2: 25 mmol/L (ref 22–32)
Calcium: 8.5 mg/dL — ABNORMAL LOW (ref 8.9–10.3)
Chloride: 102 mmol/L (ref 98–111)
Creatinine, Ser: 0.83 mg/dL (ref 0.44–1.00)
GFR, Estimated: >60 mL/min (ref >60)
Glucose, Bld: 94 mg/dL (ref 70–99)
Potassium: 3.7 mmol/L (ref 3.5–5.1)
Sodium: 135 mmol/L (ref 135–145)

## 2023-09-03 LAB — CBC
HCT: 40.1 % (ref 36.0–46.0)
Hemoglobin: 13.6 g/dL (ref 12.0–15.0)
MCH: 31.9 pg (ref 26.0–34.0)
MCHC: 33.9 g/dL (ref 30.0–36.0)
MCV: 94.1 fL (ref 80.0–100.0)
Platelets: 232 10*3/uL (ref 150–400)
RBC: 4.26 MIL/uL (ref 3.87–5.11)
RDW: 11 % — ABNORMAL LOW (ref 11.5–15.5)
WBC: 9.3 10*3/uL (ref 4.0–10.5)
nRBC: 0 % (ref 0.0–0.2)

## 2023-09-03 LAB — HIV ANTIBODY (ROUTINE TESTING W REFLEX): HIV Screen 4th Generation wRfx: NONREACTIVE

## 2023-09-03 SURGERY — ESOPHAGOGASTRODUODENOSCOPY (EGD) WITH PROPOFOL
Anesthesia: General

## 2023-09-03 MED ORDER — FENTANYL CITRATE (PF) 100 MCG/2ML IJ SOLN: 50.0000 ug | Freq: Once | INTRAMUSCULAR | Status: DC

## 2023-09-03 MED ORDER — ONDANSETRON HCL 4 MG/2ML IJ SOLN
4.0000 mg | Freq: Once | INTRAMUSCULAR | Status: AC | PRN
  Administered 2023-09-03: 4 mg via INTRAVENOUS

## 2023-09-03 MED ORDER — ONDANSETRON HCL 4 MG/2ML IJ SOLN
INTRAMUSCULAR | Status: AC
Start: 2023-09-03 — End: ?
  Filled 2023-09-03: qty 2

## 2023-09-03 MED ORDER — HYDROMORPHONE HCL 1 MG/ML IJ SOLN
1.0000 mg | INTRAMUSCULAR | Status: DC | PRN
  Administered 2023-09-03 – 2023-09-04 (×5): 1 mg via INTRAVENOUS
  Filled 2023-09-03 (×5): qty 1

## 2023-09-03 MED ORDER — LACTATED RINGERS IV SOLN: INTRAVENOUS | Status: DC | PRN

## 2023-09-03 MED ORDER — PANTOPRAZOLE SODIUM 40 MG IV SOLR
40.0000 mg | Freq: Two times a day (BID) | INTRAVENOUS | Status: DC
  Administered 2023-09-03 – 2023-09-04 (×2): 40 mg via INTRAVENOUS
  Filled 2023-09-03 (×3): qty 10

## 2023-09-03 MED ORDER — ONDANSETRON HCL 4 MG/2ML IJ SOLN
INTRAMUSCULAR | Status: DC | PRN
  Administered 2023-09-03: 4 mg via INTRAVENOUS

## 2023-09-03 MED ORDER — OXYCODONE HCL 5 MG/5ML PO SOLN: 5.0000 mg | Freq: Once | ORAL | Status: DC | PRN

## 2023-09-03 MED ORDER — OXYCODONE HCL 5 MG PO TABS: 5.0000 mg | ORAL_TABLET | Freq: Once | ORAL | Status: DC | PRN

## 2023-09-03 MED ORDER — FENTANYL CITRATE PF 50 MCG/ML IJ SOSY
PREFILLED_SYRINGE | INTRAMUSCULAR | Status: AC
  Filled 2023-09-03: qty 1

## 2023-09-03 MED ORDER — PROPOFOL 10 MG/ML IV BOLUS
INTRAVENOUS | Status: DC | PRN
  Administered 2023-09-03: 20 mg via INTRAVENOUS
  Administered 2023-09-03: 50 mg via INTRAVENOUS
  Administered 2023-09-03: 100 mg via INTRAVENOUS
  Administered 2023-09-03: 50 mg via INTRAVENOUS

## 2023-09-03 MED ORDER — LIDOCAINE HCL (CARDIAC) PF 100 MG/5ML IV SOSY
PREFILLED_SYRINGE | INTRAVENOUS | Status: DC | PRN
  Administered 2023-09-03: 60 mg via INTRAVENOUS

## 2023-09-03 MED ORDER — FENTANYL CITRATE (PF) 100 MCG/2ML IJ SOLN
25.0000 ug | INTRAMUSCULAR | Status: DC | PRN
  Administered 2023-09-03: 50 ug via INTRAVENOUS

## 2023-09-03 MED ORDER — DICYCLOMINE HCL 10 MG PO CAPS: 10.0000 mg | ORAL_CAPSULE | Freq: Two times a day (BID) | ORAL | Status: DC | PRN

## 2023-09-03 MED ORDER — MORPHINE SULFATE (PF) 2 MG/ML IV SOLN
2.0000 mg | Freq: Once | INTRAVENOUS | Status: AC
  Administered 2023-09-03: 2 mg via INTRAVENOUS
  Filled 2023-09-03: qty 1

## 2023-09-03 NOTE — Progress Notes (Signed)
  Progress Note   Patient: [name] NWG:[id number] DOB: Jun 09, 1981 DOA: 09/02/2023     0 DOS: the patient was seen and examined on 09/03/2023   Brief hospital admission course: For full details/information refer to H&P written by Dr. Randol Kern on 09/02/2023.  Briefly 42 year old female with past medical history significant for gastroesophageal flux disease, hypertension and allergy rhinitis; presenting to the hospital with intractable nausea/vomiting and abdominal pain.  Patient symptom has been present for the last 5-7 days and worsening.  Patient expressing ability to keep things down at home.  Assessment and Plan: 1-intractable nausea and vomiting -With concerns for severe gastritis/peptic ulcer disease -Case has been discussed with GI service with plan for endoscopy evaluation later today -Continue as needed analgesics; n.p.o. for now and continue PPI twice a day. -As needed analgesics will be provided -Continue as needed antiemetics.  2-chest pain -Most likely associated with GERD -No ischemic changes appreciated on telemetry or EKG. -Patient chest pain-free currently.  3-hypokalemia -The setting of decreased oral intake and ongoing GI losses -Continue to follow ultralights and further replete as needed.  4-chronic anxiety -Continue home medications.  5-history of migraine -Continue as needed Imitrex and continue the use of gabapentin. -CT head without acute intracranial normalities.   Subjective:  Afebrile, no chest pain, no shortness of breath, good saturation on room air.  Continue expressing mild nausea without vomiting currently.  Still having abdominal pain/epigastric discomfort.  Physical Exam: Vitals:   09/03/23 1330 09/03/23 1345 09/03/23 1400 09/03/23 1415  BP: 123/74 139/78 136/77 (!) 126/59  Pulse: (!) 56 (!) 57 (!) 57 (!) 54  Resp: 13 18 19 12   Temp:      TempSrc:      SpO2: 98% 97% 97% 99%  Weight:      Height:       General exam: Alert, awake,  oriented x 3; no major distress appreciated. Respiratory system: Clear to auscultation. Respiratory effort normal.  Good saturation on room air. Cardiovascular system:RRR. No rubs or gallops. Gastrointestinal system: Abdomen is nondistended, soft and with vague mid/epigastric abdominal discomfort on palpation.  Positive bowel sounds. Central nervous system: Alert and oriented. No focal neurological deficits. Extremities: No cyanosis or clubbing. Skin: No petechiae. Psychiatry: Judgement and insight appear normal. Mood & affect appropriate.   Data Reviewed: CBC: White blood cells 9.3, hemoglobin 13.6 and platelet count 232K Basic metabolic panel: Sodium 135, potassium 3.7, chloride 102, bicarb 25, BUN 7, creatinine 0.83. GFR >60  Family Communication: No family at bedside.  Disposition: Status is: Inpatient Remains inpatient appropriate because: Continue supportive care, as needed IV analgesics, continue PPI and follow GI service recommendation.   Planned Discharge Destination: Home   Time spent: 45 minutes  Author: Vassie Loll, MD 09/03/2023 6:09 PM  For on call review [email or web address].

## 2023-09-03 NOTE — Progress Notes (Signed)
   09/03/23 1314  TOC Brief Assessment  Patient has primary care physician Yes  Home environment has been reviewed Home with spouse  Prior level of function: Independent  Prior/Current Home Services No current home services  Social Determinants of Health Reivew SDOH reviewed no interventions necessary  Readmission risk has been reviewed No  Transition of care needs no transition of care needs at this time

## 2023-09-03 NOTE — Op Note (Signed)
[facility] Patient Name: Special Gartley Procedure Date: 09/03/2023 12:36 PM MRN: [id number] Date of Birth: 1981-01-25 Attending MD: Katrinka Blazing , , [id number] CSN: [id number] Age: 42 Admit Type: Inpatient Procedure:                Upper GI endoscopy Indications:              Upper abdominal pain, Nausea with vomiting Providers:                Katrinka Blazing, Angelica Ran, Francoise Ceo RN, RN,                            Crystal Page Referring MD:              Medicines:                Monitored Anesthesia Care Complications:            No immediate complications. Estimated Blood Loss:     Estimated blood loss: none. Procedure:                Pre-Anesthesia Assessment:                           - Prior to the procedure, a History and Physical                            was performed, and patient medications, allergies                            and sensitivities were reviewed. The patient's                            tolerance of previous anesthesia was reviewed.                           - The risks and benefits of the procedure and the                            sedation options and risks were discussed with the                            patient. All questions were answered and informed                            consent was obtained.                           - ASA Grade Assessment: II - A patient with mild                            systemic disease.                           After obtaining informed consent, the endoscope was                            passed under direct vision.  Throughout the                            procedure, the patient's blood pressure, pulse, and                            oxygen saturations were monitored continuously. The                            [id number]-[id number] ([id number]) scope was introduced through the                            mouth, and advanced to the second part of duodenum.                            The upper GI endoscopy was accomplished  without                            difficulty. The patient tolerated the procedure                            well. Scope In: 12:58:55 PM Scope Out: 1:05:37 PM Total Procedure Duration: 0 hours 6 minutes 42 seconds  Findings:      The examined esophagus was normal.      The gastroesophageal flap valve was visualized endoscopically and       classified as Hill Grade II (fold present, opens with respiration).      A few localized diminutive erosions with stigmata of recent bleeding       were found in the gastric antrum. Biopsies were taken with a cold       forceps for Helicobacter pylori testing.      The examined duodenum was normal. Impression:               - Normal esophagus.                           - Erosive gastropathy with stigmata of recent                            bleeding. Biopsied.                           - Normal examined duodenum. Moderate Sedation:      Per Anesthesia Care Recommendation:           - Return patient to hospital ward for ongoing care.                           - Clear liquid diet today. Advance as tolerated.                           - Await pathology results.                           - Use Prilosec (omeprazole) 40 mg PO BID.                           -  Continue Zofran and phenergan as needed for                            nausea.                           - Dicyclomine 10 mg as needed every 12 hours for                            abdominal pain.                           - If persisting abdominal pain, consider CT angio                            abdomen and pelvis with IV contrast. Procedure Code(s):        --- Professional ---                           [phone], Esophagogastroduodenoscopy, flexible,                            transoral; with biopsy, single or multiple Diagnosis Code(s):        --- Professional ---                           K92.2, Gastrointestinal hemorrhage, unspecified                           R10.10, Upper abdominal pain,  unspecified                           R11.2, Nausea with vomiting, unspecified CPT copyright 2022 American Medical Association. All rights reserved. The codes documented in this report are preliminary and upon coder review may  be revised to meet current compliance requirements. Katrinka Blazing, MD Katrinka Blazing,  09/03/2023 1:16:30 PM This report has been signed electronically. Number of Addenda: 0

## 2023-09-03 NOTE — Progress Notes (Signed)
Patient admitted to room 321. Patient brought up to the floor via wheelchair. Steady gait to bed. No nausea and vomiting.Room and unit orientation given. Bed in low position.

## 2023-09-03 NOTE — Transfer of Care (Signed)
Immediate Anesthesia Transfer of Care Note  Patient: [name]  Procedure(s) Performed: ESOPHAGOGASTRODUODENOSCOPY (EGD) WITH PROPOFOL BIOPSY  Patient Location: PACU  Anesthesia Type:General  Level of Consciousness: drowsy and patient cooperative  Airway & Oxygen Therapy: Patient Spontanous Breathing and Patient connected to nasal cannula oxygen  Post-op Assessment: Report given to RN, Post -op Vital signs reviewed and stable, and Patient moving all extremities X 4  Post vital signs: Reviewed and stable  Last Vitals:  Vitals Value Taken Time  BP 114/61 09/03/23 1310  Temp 98.1 09/03/23   1311  Pulse 56 09/03/23 1311  Resp 14 09/03/23 1311  SpO2 100 % 09/03/23 1311  Vitals shown include unfiled device data.  Last Pain:  Vitals:   09/03/23 1253  TempSrc:   PainSc: 5       Patients Stated Pain Goal: 3 (09/03/23 1208)  Complications: No notable events documented.

## 2023-09-03 NOTE — Consult Note (Signed)
Gastroenterology Consult   Referring Provider: No ref. provider found Primary Care Physician:  [facility] Primary Gastroenterologist: Dolores Frame, MD (previously Dr. Darrick Penna)  Patient ID: [name]; [id number]; 09/16/1981   Admit date: 09/02/2023  LOS: 0 days   Date of Consultation: 09/03/2023  Reason for Consultation:  intractable N/V, abdominal pain  History of Present Illness   [name] is a 42 y.o. year old [name] with history of anxiety, GERD, HTN, migraines, arthritis, and nonerosive gastritis in 2015 who presented to the ED with complaints of chest pain, diffuse abdominal pain, and recurrent nausea and vomiting.  She has had multiple prior ED visits on 11/3 and 11/4 for the same complaints and was provided IV fluids and antiemetics and discharged home.  She was treated with multiple antiemetics in the ED but failed p.o. challenge therefore she was admitted for treatment of electrolytes and further evaluation.  ED Course: Labs -potassium 2.9, glucose 107, WBC 10.7.  Hemoglobin 14.1, lipase 54 Vital signs stable, mild intermittent hypertension KUB with unremarkable bowel gas pattern, no significant fecal retention noted. CXR unremarkable  Consult: Per review of chart she has had multiple prior ED visits and underwent a CT of the abdomen pelvis with contrast on 08/30/2023 without any acute findings noted.  Gallbladder and pancreas within normal limits.  No evidence of constipation or bowel dilation.  During her visit 08/30/23 she reportedly had recently eaten some leftover chicken Alfredo and several others later began having abdominal pain, nausea, and vomiting.  She reported pain was localized to the upper abdomen and was described as a constant cramping.  She reports a history of H. pylori and gastric ulcers in the past.  She noted improvement with GI cocktail and IV droperidol.  Was given Phenergan suppositories and oral  Carafate.  Also appears patient had a CT scan of the abdomen pelvis in August 2024 for chronic diarrhea, rectal bleeding, and weight loss ordered by Dr. Sherwood Gambler which was also normal and reported history of cholecystectomy.   Today: Symptoms began shortly after migraine pain on Saturday.  Started having extreme nausea and then recurrent vomiting.  States she vomited for about 24 hours straight prior to proceeding to the ED.  Last bowel movement was Saturday morning.  She had some mild improvement after medications given in the ED and was able to tolerate water and then discharged and then shortly after had recurrent vomiting.  Emesis has been described as yellow and are dark green in color, at times has seemed almost dark brown/black.  Abdominal pain more centrally in the upper abdomen however has also reported pain in the right lower quadrant as well.  States she recently got approved for Nurtec after multiple prior authorizations, used to be on Imitrex for her migraines.  Has not been able to start Nurtec just yet as she has not had time to pick it up from the pharmacy.  She is a former smoker and denies any regular alcohol use.  Has never vaped.  She reports history of H. pylori and a gastric ulcer many years ago, had a EGD performed at [facility].    EGD April 2015: -Normal esophagus -Nonerosive gastritis in the antrum s/p biopsy -Normal duodenum s/p biopsy -No source for hematemesis found, suspected Elisabeth Cara tear given vomiting -Advised PPI twice daily - Path: Minimal chronic gastritis, chronic duodenitis consistent with peptic duodenitis     Past Medical History:  Diagnosis Date   Acid reflux  Anxiety    Arthritis    Environmental allergies    Hypertension    Migraine     Past Surgical History:  Procedure Laterality Date   arm surgery Left    plate in arm   BILATERAL SALPINGECTOMY     BONE BIOPSY Right    thumb   BRAIN SURGERY     evacuation of hematoma    CESAREAN SECTION     x2   CHOLECYSTECTOMY     ECTOPIC PREGNANCY SURGERY     x2   ENDOMETRIAL ABLATION  01/18/2018   Procedure: ENDOMETRIAL ABLATION WITH MINERVA;  Surgeon: Tilda Burrow, MD;  Location: [facility];  Service: Gynecology;;   ESOPHAGOGASTRODUODENOSCOPY (EGD) WITH PROPOFOL N/A 01/30/2014   Procedure: ESOPHAGOGASTRODUODENOSCOPY (EGD) WITH PROPOFOL;  Surgeon: West Bali, MD;  Location: [facility];  Service: Endoscopy;  Laterality: N/A;   HYSTEROSCOPY WITH D & C N/A 08/13/2015   Procedure: DILATATION AND CURETTAGE /HYSTEROSCOPY;  Surgeon: Tilda Burrow, MD;  Location: [facility];  Service: Gynecology;  Laterality: N/A;   HYSTEROSCOPY WITH D & C  01/18/2018   Procedure: CERVICAL DILATATION /HYSTEROSCOPY;  Surgeon: Tilda Burrow, MD;  Location: [facility];  Service: Gynecology;;   LEG SURGERY Left    femur rodding and removal of part of left femur   otif ankle Right     Prior to Admission medications   Medication Sig Start Date End Date Taking? Authorizing Provider  busPIRone (BUSPAR) 10 MG tablet Take 10 mg by mouth 2 (two) times daily. 01/12/20  Yes [provider]  gabapentin (NEURONTIN) 300 MG capsule Take 300 mg by mouth 3 (three) times daily. 02/09/20  Yes [provider]  HYDROcodone-acetaminophen (NORCO) 10-325 MG per tablet Take 1 tablet by mouth every 6 (six) hours as needed for moderate pain.   Yes [provider]  metoCLOPramide (REGLAN) 5 MG tablet Take 1 tablet (5 mg total) by mouth every 6 (six) hours as needed for nausea. 08/29/23  Yes Sloan Leiter, DO  Multiple Vitamins-Minerals (MULTIVITAMIN ADULT) CHEW Chew 2 each by mouth daily.   Yes [provider]  pantoprazole (PROTONIX) 20 MG tablet Take 1 tablet (20 mg total) by mouth daily for 14 days. 08/29/23 09/12/23 Yes Sloan Leiter, DO  promethazine (PHENERGAN) 25 MG suppository Place 1 suppository (25 mg total) rectally every 6 (six) hours as needed for nausea or vomiting. 08/30/23  Yes  Schutt, Edsel Petrin, PA-C  trazodone (DESYREL) 300 MG tablet Take 300 mg by mouth daily. 08/10/23  Yes [provider]  amoxicillin-clavulanate (AUGMENTIN) 875-125 MG tablet Take 1 tablet by mouth 2 (two) times daily. Patient not taking: Reported on 09/02/2023 08/17/23   [provider]  methylPREDNISolone (MEDROL DOSEPAK) 4 MG TBPK tablet Take 4 mg by mouth as directed. Patient not taking: Reported on 09/02/2023 08/17/23   [provider]  norethindrone (MICRONOR) 0.35 MG tablet Take 1 tablet (0.35 mg total) by mouth daily. Patient not taking: Reported on 09/02/2023 08/04/23 11/02/23  Myna Hidalgo, DO  NURTEC 75 MG TBDP Take 1 tablet by mouth every other day. Patient not taking: Reported on 09/02/2023 08/26/23   [provider]  sucralfate (CARAFATE) 1 g tablet Take 1 tablet (1 g total) by mouth 4 (four) times daily -  with meals and at bedtime for 4 days. Patient not taking: Reported on 09/02/2023 08/30/23 09/03/23  Michelle Piper, PA-C    Current Facility-Administered Medications  Medication Dose Route Frequency Provider Last Rate  Last Admin   0.9 %  sodium chloride infusion   Intravenous Continuous Elgergawy, Leana Roe, MD 100 mL/hr at 09/03/23 0647 New Bag at 09/03/23 0647   acetaminophen (TYLENOL) tablet 650 mg  650 mg Oral Q6H PRN Elgergawy, Leana Roe, MD       Or   acetaminophen (TYLENOL) suppository 650 mg  650 mg Rectal Q6H PRN Elgergawy, Leana Roe, MD       busPIRone (BUSPAR) tablet 10 mg  10 mg Oral BID Elgergawy, Leana Roe, MD   10 mg at 09/02/23 2302   gabapentin (NEURONTIN) capsule 300 mg  300 mg Oral TID Elgergawy, Leana Roe, MD   300 mg at 09/02/23 2303   HYDROmorphone (DILAUDID) injection 1 mg  1 mg Intravenous Q4H PRN Vassie Loll, MD   1 mg at 09/03/23 0821   ondansetron (ZOFRAN) injection 4 mg  4 mg Intravenous Q6H PRN Elgergawy, Leana Roe, MD   4 mg at 09/03/23 0419   pantoprazole (PROTONIX) injection 40 mg  40 mg Intravenous Q24H Elgergawy,  Leana Roe, MD   40 mg at 09/03/23 0820   SUMAtriptan (IMITREX) injection 6 mg  6 mg Subcutaneous Q2H PRN Elgergawy, Leana Roe, MD       traZODone (DESYREL) tablet 300 mg  300 mg Oral QHS Elgergawy, Leana Roe, MD   300 mg at 09/02/23 2303    Allergies as of 09/02/2023 - Review Complete 09/02/2023  Allergen Reaction Noted   Demerol Nausea Only 01/14/2011   Orange fruit [citrus]  01/26/2014    Family History  Problem Relation Age of Onset   Mental illness Mother    Diabetes Mother    Obesity Mother    Heart disease Father    Obesity Sister    Lupus Paternal Aunt    Cancer Maternal Grandfather        GASTRIC   Lupus Paternal Grandmother    Diabetes Son        boarderline   Arthritis Other    Cancer Other    Diabetes Other     Social History   Socioeconomic History   Marital status: Married    Spouse name: Not on file   Number of children: Not on file   Years of education: Not on file   Highest education level: Not on file  Occupational History   Not on file  Tobacco Use   Smoking status: Former    Current packs/day: 0.00    Average packs/day: 1 pack/day for 20.0 years (20.0 ttl pk-yrs)    Types: Cigarettes    Start date: 12/02/1991    Quit date: 12/02/2011    Years since quitting: 11.7   Smokeless tobacco: Never  Vaping Use   Vaping status: Never Used  Substance and Sexual Activity   Alcohol use: No   Drug use: No   Sexual activity: Yes    Birth control/protection: Surgical  Other Topics Concern   Not on file  Social History Narrative   Not on file   Social Determinants of Health   Financial Resource Strain: Low Risk  (08/04/2023)   Overall Financial Resource Strain (CARDIA)    Difficulty of Paying Living Expenses: Not very hard  Food Insecurity: No Food Insecurity (09/02/2023)   Hunger Vital Sign    Worried About Running Out of Food in the Last Year: Never true    Ran Out of Food in the Last Year: Never true  Transportation Needs: No Transportation Needs  (09/02/2023)   PRAPARE -  Administrator, Civil Service (Medical): No    Lack of Transportation (Non-Medical): No  Physical Activity: Insufficiently Active (08/04/2023)   Exercise Vital Sign    Days of Exercise per Week: 3 days    Minutes of Exercise per Session: 30 min  Stress: No Stress Concern Present (08/04/2023)   [employer] of Occupational Health - Occupational Stress Questionnaire    Feeling of Stress : Only a little  Social Connections: Moderately Integrated (08/04/2023)   Social Connection and Isolation Panel [NHANES]    Frequency of Communication with Friends and Family: Three times a week    Frequency of Social Gatherings with Friends and Family: Twice a week    Attends Religious Services: 1 to 4 times per year    Active Member of [employer] or Organizations: No    Attends Banker Meetings: Never    Marital Status: Married  Catering manager Violence: Not At Risk (09/02/2023)   Humiliation, Afraid, Rape, and Kick questionnaire    Fear of Current or Ex-Partner: No    Emotionally Abused: No    Physically Abused: No    Sexually Abused: No     Review of Systems   Gen: Denies any fever, chills, loss of appetite, change in weight or weight loss CV: + chest pain. Denies  heart palpitations, syncope, edema  Resp: Denies shortness of breath with rest, cough, wheezing, coughing up blood, and pleurisy. GI: see HPI GU : Denies urinary burning, blood in urine, urinary frequency, and urinary incontinence. MS: Denies joint pain, limitation of movement, swelling, cramps, and atrophy.  Derm: Denies rash, itching, dry skin, hives. Psych: Denies depression, anxiety, memory loss, hallucinations, and confusion. Heme: Denies bruising or bleeding Neuro:  + neck pain, headaches. Denies any dizziness, paresthesias, shaking  Physical Exam   Vital Signs in last 24 hours: Temp:  [97.8 F (36.6 C)-99.7 F (37.6 C)] 98.2 F (36.8 C) (11/08 0900) Pulse Rate:  [60-124]  60 (11/08 0900) Resp:  [13-20] 20 (11/08 0900) BP: (117-159)/(72-91) 122/77 (11/08 0900) SpO2:  [97 %-100 %] 99 % (11/08 0900) Weight:  [55.2 kg-57.6 kg] 55.2 kg (11/07 2022) Last BM Date : 08/28/23  General:   Alert,  Well-developed, well-nourished, pleasant and cooperative in NAD Head:  Normocephalic and atraumatic. Eyes:  Sclera clear, no icterus.   Conjunctiva pink. Ears:  Normal auditory acuity. Neck:  Supple; no masses Lungs:  Clear throughout to auscultation.   No wheezes, crackles, or rhonchi. No acute distress. Heart:  Regular rate and rhythm; no murmurs, clicks, rubs,  or gallops. Abdomen:  Soft, non-distended. + BS. Significant ttp and guarding  across upper abdomen. Moderate ttp to RLQ. No HSM. Rectal: deferred   Msk:  Symmetrical without gross deformities. Normal posture. Extremities:  Without clubbing or edema. Neurologic:  Alert and oriented x4. Right facial palsy.  Skin:  Intact without significant lesions or rashes. Psych:  Alert and cooperative. Normal mood and affect.  Intake/Output from previous day: 11/07 0701 - 11/08 0700 In: 817.4 [I.V.:717.4; IV Piggyback:100] Out: -  Intake/Output this shift: No intake/output data recorded.  Labs/Studies   Recent Labs Recent Labs    09/02/23 1314 09/03/23 0535  WBC 10.7* 9.3  HGB 14.1 13.6  HCT 40.1 40.1  PLT 268 232   BMET Recent Labs    09/02/23 1314 09/03/23 0535  NA 136 135  K 2.9* 3.7  CL 98 102  CO2 26 25  GLUCOSE 107* 94  BUN 9 7  CREATININE  0.73 0.83  CALCIUM 9.2 8.5*   LFT No results for input(s): "PROT", "ALBUMIN", "AST", "ALT", "ALKPHOS", "BILITOT", "BILIDIR", "IBILI" in the last 72 hours. PT/INR No results for input(s): "LABPROT", "INR" in the last 72 hours. Hepatitis Panel No results for input(s): "HEPBSAG", "HCVAB", "HEPAIGM", "HEPBIGM" in the last 72 hours. C-Diff No results for input(s): "CDIFFTOX" in the last 72 hours.  Radiology/Studies CT HEAD WO CONTRAST ( )  Result  Date: 09/02/2023 CLINICAL DATA:  Headache, classic migraine. EXAM: CT HEAD WITHOUT CONTRAST TECHNIQUE: Contiguous axial images were obtained from the base of the skull through the vertex without intravenous contrast. RADIATION DOSE REDUCTION: This exam was performed according to the departmental dose-optimization program which includes automated exposure control, adjustment of the mA and/or kV according to patient size and/or use of iterative reconstruction technique. COMPARISON:  06/15/2023. FINDINGS: Brain: No acute intracranial hemorrhage, midline shift or mass effect. No extra-axial fluid collection. Gray-white matter differentiation is within normal limits. No hydrocephalus. Vascular: No hyperdense vessel or unexpected calcification. Skull: No acute fracture. Fixation hardware is noted in the frontal parietal region on the left. Sinuses/Orbits: No acute finding. Other: None. IMPRESSION: No acute intracranial process. Electronically Signed   By: Thornell Sartorius M.D.   On: 09/02/2023 20:24   DG Abd Portable 1 View  Result Date: 09/02/2023 CLINICAL DATA:  Abdominal pain, nausea, constipation EXAM: PORTABLE ABDOMEN - 1 VIEW COMPARISON:  08/30/2023 FINDINGS: Supine frontal view of the abdomen and pelvis demonstrates an unremarkable bowel gas pattern. No bowel obstruction or ileus. No significant fecal retention. No masses or abnormal calcifications. No acute bony abnormalities. IMPRESSION: 1. Unremarkable bowel gas pattern. Electronically Signed   By: Sharlet Salina M.D.   On: 09/02/2023 17:03   DG Chest 2 View  Result Date: 09/02/2023 CLINICAL DATA:  Chest pain. EXAM: CHEST - 2 VIEW COMPARISON:  03/18/2010. FINDINGS: Bilateral lung fields are clear. Bilateral costophrenic angles are clear. Normal cardio-mediastinal silhouette. No acute osseous abnormalities. The soft tissues are within normal limits. IMPRESSION: *No active cardiopulmonary disease. Electronically Signed   By: Jules Schick M.D.   On:  09/02/2023 15:02     Assessment   Calisa Mychelle Errickson is a 42 y.o. year old [name] with history of anxiety, GERD, HTN, migraines, arthritis, and nonerosive gastritis in 2015 who presented to the ED with complaints of chest pain, diffuse abdominal pain, and recurrent nausea and vomiting.  She has had multiple prior ED visits on 11/3 and 11/4 for the same complaints and was provided IV fluids and antiemetics and discharged home.  She was treated with multiple antiemetics in the ED but failed p.o. challenge therefore she was admitted for treatment of electrolyte derangements. GI consulted for further evaluation.   Intractable nausea, vomiting, abdominal pain: Symptoms intermittent and consistent since Saturday.  Has not had much oral intake.  Also no bowel movement since Saturday.  Emesis described as dark green and bile in color and with intermittent brown color.  Suspect intermittent brown emesis likely secondary to esophagitis or Mallory-Weiss tear given recurrent vomiting.  Notes history of chronic migraines, previously on Imitrex and recently prescribed Nurtec although has not been able to start medication yet.  Denies regular alcohol use, is a former smoker.  No NSAIDs. Etiology unclear at this time however differentials include esophagitis, gastritis, duodenitis, peptic ulcer disease.  Unlikely biliary etiology given she is s/p cholecystectomy and does not have any risk factors currently for gastroparesis.  Abdominal migraine/CVS also within differential.  Last upper endoscopy performed in  2015 with evidence of nonerosive gastritis and chronic peptic duodenitis. Reported history of gastric ulcer and H. Pylori many years ago with EGD at [facility].   Plan / Recommendations   IV PPI BID Zofran and Reglan as needed NPO EGD today with Dr. Levon Hedger     09/03/2023, 10:02 AM  Brooke Bonito, MSN, FNP-BC, AGACNP-BC [facility]

## 2023-09-03 NOTE — Plan of Care (Signed)

## 2023-09-03 NOTE — Plan of Care (Signed)
NPO since midnight for possible EGD this morning. Medicated with zofran for nausea and morphine for abdominal pain during the shift.  Problem: Education: Goal: Knowledge of General Education information will improve Description: Including pain rating scale, medication(s)/side effects and non-pharmacologic comfort measures Outcome: Progressing   Problem: Health Behavior/Discharge Planning: Goal: Ability to manage health-related needs will improve Outcome: Progressing   Problem: Clinical Measurements: Goal: Ability to maintain clinical measurements within normal limits will improve Outcome: Progressing Goal: Will remain free from infection Outcome: Progressing Goal: Diagnostic test results will improve Outcome: Progressing Goal: Respiratory complications will improve Outcome: Progressing Goal: Cardiovascular complication will be avoided Outcome: Progressing   Problem: Activity: Goal: Risk for activity intolerance will decrease Outcome: Progressing   Problem: Coping: Goal: Level of anxiety will decrease Outcome: Progressing   Problem: Elimination: Goal: Will not experience complications related to bowel motility Outcome: Progressing Goal: Will not experience complications related to urinary retention Outcome: Progressing   Problem: Pain Management: Goal: General experience of comfort will improve Outcome: Progressing   Problem: Safety: Goal: Ability to remain free from injury will improve Outcome: Progressing   Problem: Skin Integrity: Goal: Risk for impaired skin integrity will decrease Outcome: Progressing   Problem: Nutrition: Goal: Adequate nutrition will be maintained Outcome: Not Progressing

## 2023-09-03 NOTE — Progress Notes (Signed)
Awake. Sprite given to drink. Took 2 sips of sprite. Emesis of amount of sprite taken. Resting quietly.

## 2023-09-03 NOTE — Brief Op Note (Signed)
09/03/2023  1:14 PM  PATIENT:  [name]  42 y.o. female  PRE-OPERATIVE DIAGNOSIS:  intractable nausea nad vomiting, abdominal pain  POST-OPERATIVE DIAGNOSIS:  gastric erosions  PROCEDURE:  Procedure(s): ESOPHAGOGASTRODUODENOSCOPY (EGD) WITH PROPOFOL (N/A) BIOPSY  SURGEON:  Surgeons and Role:    * Dolores Frame, MD - Primary  Patient underwent EGD under propofol sedation.  Tolerated the procedure adequately.   FINDINGS: - Normal esophagus.  - Erosive gastropathy with stigmata of recent bleeding.  Biopsied.  - Normal examined duodenum.   RECOMMENDATIONS - Return patient to hospital ward for ongoing care.  - Clear liquid diet today. Advance as tolerated. - Await pathology results.  - Use Prilosec (omeprazole) 40 mg PO BID.  - Continue Zofran and phenergan as needed for nausea. - Dicyclomine 10 mg as needed every 12 hours for abdominal pain. - If persisting abdominal pain, consider CT angio abdomen and pelvis with IV contrast.  Katrinka Blazing, MD [facility] [address] [facility]

## 2023-09-04 ENCOUNTER — Telehealth (INDEPENDENT_AMBULATORY_CARE_PROVIDER_SITE_OTHER): Payer: Self-pay | Admitting: Gastroenterology

## 2023-09-04 DIAGNOSIS — R1010 Upper abdominal pain, unspecified: Secondary | ICD-10-CM | POA: Diagnosis not present

## 2023-09-04 DIAGNOSIS — R112 Nausea with vomiting, unspecified: Secondary | ICD-10-CM | POA: Diagnosis not present

## 2023-09-04 DIAGNOSIS — E876 Hypokalemia: Secondary | ICD-10-CM | POA: Diagnosis not present

## 2023-09-04 DIAGNOSIS — G43909 Migraine, unspecified, not intractable, without status migrainosus: Secondary | ICD-10-CM | POA: Diagnosis not present

## 2023-09-04 DIAGNOSIS — G8929 Other chronic pain: Secondary | ICD-10-CM | POA: Diagnosis not present

## 2023-09-04 MED ORDER — ONDANSETRON 8 MG PO TBDP: 8.0000 mg | ORAL_TABLET | Freq: Three times a day (TID) | ORAL | 0 refills | Status: DC | PRN

## 2023-09-04 MED ORDER — SUCRALFATE 1 G PO TABS: 1.0000 g | ORAL_TABLET | Freq: Three times a day (TID) | ORAL | 0 refills | Status: DC

## 2023-09-04 MED ORDER — PANTOPRAZOLE SODIUM 40 MG PO TBEC: 40.0000 mg | DELAYED_RELEASE_TABLET | Freq: Two times a day (BID) | ORAL | 1 refills | Status: DC

## 2023-09-04 MED ORDER — HYDROCODONE-ACETAMINOPHEN 10-325 MG PO TABS: 1.0000 | ORAL_TABLET | Freq: Four times a day (QID) | ORAL | 0 refills | Status: DC | PRN

## 2023-09-04 MED ORDER — DICYCLOMINE HCL 10 MG PO CAPS: 10.0000 mg | ORAL_CAPSULE | Freq: Three times a day (TID) | ORAL | 0 refills | Status: DC | PRN

## 2023-09-04 MED ORDER — PROMETHAZINE HCL 25 MG RE SUPP: 25.0000 mg | Freq: Four times a day (QID) | RECTAL | Status: DC | PRN

## 2023-09-04 NOTE — Discharge Summary (Signed)
Physician Discharge Summary   Patient: [name] MRN: [id number] DOB: 11-24-1980  Admit date:     09/02/2023  Discharge date: 09/04/23  Discharge Physician: Vassie Loll   PCP: [facility]   Recommendations at discharge:  Repeat basic metabolic panel to follow ultralights renal function Repeat CBC to follow hemoglobin trend/stability Reassess blood pressure and if needed*antihypertensive regimen.  Discharge Diagnoses: Principal Problem:   Intractable nausea and vomiting Active Problems:   Chronic pain   Chronic anxiety   Hypertension   Migraine   Chest pain  Brief hospital admission course: For full details/information refer to H&P written by Dr. Randol Kern on 09/02/2023.  Briefly 42 year old female with past medical history significant for gastroesophageal flux disease, hypertension and allergy rhinitis; presenting to the hospital with intractable nausea/vomiting and abdominal pain.  Patient symptom has been present for the last 5-7 days and worsening.  Patient expressing ability to keep things down at home.    Assessment and Plan: 1-intractable nausea and vomiting -Appreciate assistance and recommendation by gastroenterology service; status post endoscopic evaluation that demonstrated erosive gastritis. -Continue as needed analgesics; and day use of PPI twice a day. -Prescription for Carafate and Bentyl has been provided -Outpatient follow-up with GI service as an outpatient will be arranged -Patient instructed to avoid the use of NSAIDs. -Continue as needed antiemetics. -At discharge no nausea, no vomiting and tolerating diet.   2-chest pain -Most likely associated with GERD -No ischemic changes appreciated on telemetry or EKG. -Patient chest pain-free currently.   3-hypokalemia -In the setting of decreased oral intake and ongoing GI losses -Repleted and within normal limits at discharge Repeat basic metabolic panel follow-up visit to assess  electrolytes trend.   4-chronic anxiety -Continue home medications. -Stable mood appreciated throughout hospitalization.   5-history of migraine -Continue treatment with Neurontin and resume home as needed Nurtec medication. -CT head without acute intracranial normalities.  6-history of hypertension -Currently not taking any antihypertensive agent -Continue lifestyle changes and follow heart healthy/low-sodium diet -Continue to follow vital signs intermittently and follow further recommendations and treatment initiation as required by PCP.    Consultants: Gastroenterology service. Procedures performed: See below for x-ray report; endoscopy. Disposition: Home Diet recommendation: Heart healthy diet.  DISCHARGE MEDICATION: Allergies as of 09/04/2023       Reactions   Demerol Nausea Only   Orange Fruit [citrus]    Fever blisters        Medication List     STOP taking these medications    amoxicillin-clavulanate 875-125 MG tablet Commonly known as: AUGMENTIN   methylPREDNISolone 4 MG Tbpk tablet Commonly known as: MEDROL DOSEPAK   metoCLOPramide 5 MG tablet Commonly known as: REGLAN   norethindrone 0.35 MG tablet Commonly known as: MICRONOR       TAKE these medications    busPIRone 10 MG tablet Commonly known as: BUSPAR Take 10 mg by mouth 2 (two) times daily.   dicyclomine 10 MG capsule Commonly known as: BENTYL Take 1 capsule (10 mg total) by mouth every 8 (eight) hours as needed (abdominal pain).   gabapentin 300 MG capsule Commonly known as: NEURONTIN Take 300 mg by mouth 3 (three) times daily.   HYDROcodone-acetaminophen 10-325 MG tablet Commonly known as: NORCO Take 1-2 tablets by mouth every 6 (six) hours as needed for moderate pain (pain score 4-6). What changed: how much to take   Multivitamin Adult Chew Chew 2 each by mouth daily.   Nurtec 75 MG Tbdp Generic drug: Rimegepant Sulfate  Take 1 tablet by mouth every other day.    ondansetron 8 MG disintegrating tablet Commonly known as: ZOFRAN-ODT Take 1 tablet (8 mg total) by mouth every 8 (eight) hours as needed.   pantoprazole 40 MG tablet Commonly known as: PROTONIX Take 1 tablet (40 mg total) by mouth 2 (two) times daily. What changed:  medication strength how much to take when to take this   promethazine 25 MG suppository Commonly known as: PHENERGAN Place 1 suppository (25 mg total) rectally every 6 (six) hours as needed for refractory nausea / vomiting. What changed: reasons to take this   sucralfate 1 g tablet Commonly known as: Carafate Take 1 tablet (1 g total) by mouth in the morning, at noon, and at bedtime for 10 days. What changed: when to take this   trazodone 300 MG tablet Commonly known as: DESYREL Take 300 mg by mouth daily.        Follow-up Information     [facility] [employer] [facility]. Schedule an appointment as soon as possible for a visit in 10 day(s).   Specialty: Family Medicine Contact information: [address] [place] [address] [phone]                Discharge Exam: Filed Weights   09/02/23 1239 09/02/23 2022 09/03/23 1208  Weight: 57.6 kg 55.2 kg 55.2 kg   General exam: Alert, awake, oriented x 3; no nausea or vomiting and tolerating diet.  Still having some mild intermittent mid epigastric discomfort. Respiratory system: Clear to auscultation. Respiratory effort normal. Cardiovascular system:RRR. No murmurs, rubs, gallops. Gastrointestinal system: Abdomen is nondistended, soft and mildly tender to palpation in mid epigastric/mid abdomen; no guarding, positive bowel sounds. Central nervous system: Alert and oriented. No focal neurological deficits. Extremities: No C/C/E, +pedal pulses Skin: No rashes, lesions or ulcers Psychiatry: Judgement and insight appear normal. Mood & affect appropriate.    Condition at discharge: Stable and improved.  The results of significant  diagnostics from this hospitalization (including imaging, microbiology, ancillary and laboratory) are listed below for reference.   Imaging Studies: CT HEAD WO CONTRAST ( )  Result Date: 09/02/2023 CLINICAL DATA:  Headache, classic migraine. EXAM: CT HEAD WITHOUT CONTRAST TECHNIQUE: Contiguous axial images were obtained from the base of the skull through the vertex without intravenous contrast. RADIATION DOSE REDUCTION: This exam was performed according to the departmental dose-optimization program which includes automated exposure control, adjustment of the mA and/or kV according to patient size and/or use of iterative reconstruction technique. COMPARISON:  06/15/2023. FINDINGS: Brain: No acute intracranial hemorrhage, midline shift or mass effect. No extra-axial fluid collection. Gray-white matter differentiation is within normal limits. No hydrocephalus. Vascular: No hyperdense vessel or unexpected calcification. Skull: No acute fracture. Fixation hardware is noted in the frontal parietal region on the left. Sinuses/Orbits: No acute finding. Other: None. IMPRESSION: No acute intracranial process. Electronically Signed   By: Thornell Sartorius M.D.   On: 09/02/2023 20:24   DG Abd Portable 1 View  Result Date: 09/02/2023 CLINICAL DATA:  Abdominal pain, nausea, constipation EXAM: PORTABLE ABDOMEN - 1 VIEW COMPARISON:  08/30/2023 FINDINGS: Supine frontal view of the abdomen and pelvis demonstrates an unremarkable bowel gas pattern. No bowel obstruction or ileus. No significant fecal retention. No masses or abnormal calcifications. No acute bony abnormalities. IMPRESSION: 1. Unremarkable bowel gas pattern. Electronically Signed   By: Sharlet Salina M.D.   On: 09/02/2023 17:03   DG Chest 2 View  Result Date: 09/02/2023 CLINICAL DATA:  Chest  pain. EXAM: CHEST - 2 VIEW COMPARISON:  03/18/2010. FINDINGS: Bilateral lung fields are clear. Bilateral costophrenic angles are clear. Normal cardio-mediastinal  silhouette. No acute osseous abnormalities. The soft tissues are within normal limits. IMPRESSION: *No active cardiopulmonary disease. Electronically Signed   By: Jules Schick M.D.   On: 09/02/2023 15:02   CT ABDOMEN PELVIS W CONTRAST  Result Date: 08/30/2023 CLINICAL DATA:  Abdominal pain, vomiting EXAM: CT ABDOMEN AND PELVIS WITH CONTRAST TECHNIQUE: Multidetector CT imaging of the abdomen and pelvis was performed using the standard protocol following bolus administration of intravenous contrast. RADIATION DOSE REDUCTION: This exam was performed according to the departmental dose-optimization program which includes automated exposure control, adjustment of the mA and/or kV according to patient size and/or use of iterative reconstruction technique. CONTRAST:  75mL OMNIPAQUE IOHEXOL 300 MG/ML  SOLN COMPARISON:  06/15/2023 FINDINGS: Lower chest: No acute abnormality Hepatobiliary: No focal hepatic abnormality. Gallbladder unremarkable. Pancreas: No focal abnormality or ductal dilatation. Spleen: No focal abnormality.  Normal size. Adrenals/Urinary Tract: No adrenal abnormality. No focal renal abnormality. No stones or hydronephrosis. Urinary bladder is unremarkable. Stomach/Bowel: Stomach, large and small bowel grossly unremarkable. Normal appendix. Vascular/Lymphatic: No evidence of aneurysm or adenopathy. Reproductive: Uterus and adnexa unremarkable.  No mass. Other: No free fluid or free air. Musculoskeletal: No acute bony abnormality. IMPRESSION: No acute findings in the abdomen or pelvis. Electronically Signed   By: Charlett Nose M.D.   On: 08/30/2023 20:05    Microbiology: Results for orders placed or performed in visit on 12/16/17  GC/Chlamydia Probe Amp     Status: None   Collection Time: 12/16/17  4:30 PM   [facility]  Result Value Ref Range Status   Chlamydia trachomatis, NAA Negative Negative Final   Neisseria gonorrhoeae by PCR Negative Negative Final    Labs: CBC: Recent Labs  Lab  08/29/23 2101 08/30/23 1641 09/02/23 1314 09/03/23 0535  WBC 12.0* 10.4 10.7* 9.3  NEUTROABS  --  7.3  --   --   HGB 13.9 14.1 14.1 13.6  HCT 40.7 41.0 40.1 40.1  MCV 94.0 94.5 92.0 94.1  PLT 258 229 268 232   Basic Metabolic Panel: Recent Labs  Lab 08/29/23 2101 08/30/23 1803 09/02/23 1314 09/02/23 1454 09/03/23 0535  NA 135 137 136  --  135  K 3.6 3.2* 2.9*  --  3.7  CL 101 107 98  --  102  CO2 23 21* 26  --  25  GLUCOSE 122* 105* 107*  --  94  BUN 11 7 9   --  7  CREATININE 0.72 0.62 0.73  --  0.83  CALCIUM 9.2 7.9* 9.2  --  8.5*  MG  --   --   --  1.9  --    Liver Function Tests: Recent Labs  Lab 08/29/23 2101 08/30/23 1803  AST 21 46*  ALT 21 46*  ALKPHOS 31* 28*  BILITOT 0.6 0.9  PROT 7.6 5.8*  ALBUMIN 4.4 3.6   CBG: Recent Labs  Lab 09/02/23 1245  GLUCAP 116*    Discharge time spent: greater than 30 minutes.  Signed: Vassie Loll, MD [facility] 09/04/2023

## 2023-09-04 NOTE — Anesthesia Preprocedure Evaluation (Signed)
Anesthesia Evaluation  Patient identified by MRN, date of birth, ID band Patient awake    Reviewed: Allergy & Precautions, H&P , NPO status , Patient's Chart, lab work & pertinent test results, reviewed documented beta blocker date and time   Airway Mallampati: II  TM Distance: >3 FB Neck ROM: full    Dental no notable dental hx.    Pulmonary neg pulmonary ROS, former smoker   Pulmonary exam normal breath sounds clear to auscultation       Cardiovascular Exercise Tolerance: Good hypertension, negative cardio ROS  Rhythm:regular Rate:Normal     Neuro/Psych  Headaches  Anxiety     negative neurological ROS  negative psych ROS   GI/Hepatic negative GI ROS, Neg liver ROS,GERD  ,,  Endo/Other  negative endocrine ROS    Renal/GU negative Renal ROS  negative genitourinary   Musculoskeletal   Abdominal   Peds  Hematology negative hematology ROS (+)   Anesthesia Other Findings   Reproductive/Obstetrics negative OB ROS                             Anesthesia Physical Anesthesia Plan  ASA: 3  Anesthesia Plan: General   Post-op Pain Management:    Induction:   PONV Risk Score and Plan:   Airway Management Planned:   Additional Equipment:   Intra-op Plan:   Post-operative Plan:   Informed Consent: I have reviewed the patients History and Physical, chart, labs and discussed the procedure including the risks, benefits and alternatives for the proposed anesthesia with the patient or authorized representative who has indicated his/her understanding and acceptance.     Dental Advisory Given  Plan Discussed with: CRNA  Anesthesia Plan Comments:        Anesthesia Quick Evaluation

## 2023-09-04 NOTE — Progress Notes (Signed)
Patient discharged home today, transported home by family. Discharge paperwork went over with patient, patient verbalized understanding. Belongings sent home with patient.  ?

## 2023-09-04 NOTE — Telephone Encounter (Signed)
Hi Mitzie,  Can you please schedule a follow up appointment for this patient in 2-3 weeks with me or any of the APPs?  Thanks,  Jahaziel Francois Castaneda, MD Gastroenterology and Hepatology [facility]  

## 2023-09-04 NOTE — Plan of Care (Signed)
Rested well during the night. No c/o abdominal pain. No nausea and no vomiting during the shift.  Problem: Education: Goal: Knowledge of General Education information will improve Description: Including pain rating scale, medication(s)/side effects and non-pharmacologic comfort measures Outcome: Progressing   Problem: Health Behavior/Discharge Planning: Goal: Ability to manage health-related needs will improve Outcome: Progressing   Problem: Clinical Measurements: Goal: Ability to maintain clinical measurements within normal limits will improve Outcome: Progressing Goal: Will remain free from infection Outcome: Progressing Goal: Diagnostic test results will improve Outcome: Progressing Goal: Respiratory complications will improve Outcome: Progressing Goal: Cardiovascular complication will be avoided Outcome: Progressing   Problem: Activity: Goal: Risk for activity intolerance will decrease Outcome: Progressing   Problem: Nutrition: Goal: Adequate nutrition will be maintained Outcome: Progressing   Problem: Coping: Goal: Level of anxiety will decrease Outcome: Progressing   Problem: Elimination: Goal: Will not experience complications related to bowel motility Outcome: Progressing Goal: Will not experience complications related to urinary retention Outcome: Progressing   Problem: Pain Management: Goal: General experience of comfort will improve Outcome: Progressing   Problem: Safety: Goal: Ability to remain free from injury will improve Outcome: Progressing   Problem: Skin Integrity: Goal: Risk for impaired skin integrity will decrease Outcome: Progressing

## 2023-09-04 NOTE — Progress Notes (Signed)
[name], M.D. Gastroenterology & Hepatology   Interval History:  Patient reports feeling better today, states that she has milder pain in her abdomen, 3 out of 10 in severity.  Has not vomited anymore and was able to tolerate clear liquid diet. Patient states that she believes she may have had initial symptoms as she has been under significant stress since she recently found out her aunt passed away.  She believes this may have led to exacerbation of her symptoms.  Inpatient Medications:  Current Facility-Administered Medications:    acetaminophen (TYLENOL) tablet 650 mg, 650 mg, Oral, Q6H PRN **OR** acetaminophen (TYLENOL) suppository 650 mg, 650 mg, Rectal, Q6H PRN, Elgergawy, Leana Roe, MD   busPIRone (BUSPAR) tablet 10 mg, 10 mg, Oral, BID, Elgergawy, Leana Roe, MD, 10 mg at 09/04/23 0859   dicyclomine (BENTYL) capsule 10 mg, 10 mg, Oral, Q12H PRN, Marguerita Merles, Cartier Mapel, MD   gabapentin (NEURONTIN) capsule 300 mg, 300 mg, Oral, TID, Elgergawy, Leana Roe, MD, 300 mg at 09/04/23 0859   HYDROmorphone (DILAUDID) injection 1 mg, 1 mg, Intravenous, Q4H PRN, Vassie Loll, MD, 1 mg at 09/04/23 0802   ondansetron (ZOFRAN) injection 4 mg, 4 mg, Intravenous, Q6H PRN, Elgergawy, Leana Roe, MD, 4 mg at 09/03/23 0419   pantoprazole (PROTONIX) injection 40 mg, 40 mg, Intravenous, Q12H, Marguerita Merles, Reuel Boom, MD, 40 mg at 09/04/23 0802   SUMAtriptan (IMITREX) injection 6 mg, 6 mg, Subcutaneous, Q2H PRN, Elgergawy, Leana Roe, MD   traZODone (DESYREL) tablet 300 mg, 300 mg, Oral, QHS, Elgergawy, Leana Roe, MD, 300 mg at 09/03/23 2218   I/O    Intake/Output Summary (Last 24 hours) at 09/04/2023 1002 Last data filed at 09/04/2023 0958 Gross per 24 hour  Intake 1671 ml  Output --  Net 1671 ml     Physical Exam: Temp:  [98.1 F (36.7 C)-98.5 F (36.9 C)] 98.5 F (36.9 C) (11/09 0406) Pulse Rate:  [54-85] 85 (11/09 0406) Resp:  [12-22] 18 (11/09 0406) BP: (114-149)/(59-88) 120/75 (11/09  0406) SpO2:  [97 %-100 %] 99 % (11/09 0406) Weight:  [55.2 kg] 55.2 kg (11/08 1208)  Temp (24hrs), Avg:98.3 F (36.8 C), Min:98.1 F (36.7 C), Max:98.5 F (36.9 C)  GENERAL: The patient is AO x3, in no acute distress. HEENT: Head is normocephalic and atraumatic. EOMI are intact. Mouth is well hydrated and without lesions. NECK: Supple. No masses LUNGS: Clear to auscultation. No presence of rhonchi/wheezing/rales. Adequate chest expansion HEART: RRR, normal s1 and s2. ABDOMEN: Mild tender upon palpation of the upper abdomen, no guarding, no peritoneal signs, and nondistended. BS +. No masses. EXTREMITIES: Without any cyanosis, clubbing, rash, lesions or edema. NEUROLOGIC: AOx3, no focal motor deficit. SKIN: no jaundice, no rashes  Laboratory Data: CBC:     Component Value Date/Time   WBC 9.3 09/03/2023 0535   RBC 4.26 09/03/2023 0535   HGB 13.6 09/03/2023 0535   HGB 13.4 08/15/2015 0935   HCT 40.1 09/03/2023 0535   HCT 39.2 08/15/2015 0935   PLT 232 09/03/2023 0535   PLT 288 08/15/2015 0935   MCV 94.1 09/03/2023 0535   MCV 91 08/15/2015 0935   MCH 31.9 09/03/2023 0535   MCHC 33.9 09/03/2023 0535   RDW 11.0 (L) 09/03/2023 0535   RDW 12.5 08/15/2015 0935   LYMPHSABS 1.9 08/30/2023 1641   LYMPHSABS 2.1 08/15/2015 0935   MONOABS 1.2 (H) 08/30/2023 1641   EOSABS 0.0 08/30/2023 1641   EOSABS 0.1 08/15/2015 0935   BASOSABS 0.0 08/30/2023 1641  BASOSABS 0.0 08/15/2015 0935   COAG: No results found for: "INR", "PROTIME"  BMP:     Latest Ref Rng & Units 09/03/2023    5:35 AM 09/02/2023    1:14 PM 08/30/2023    6:03 PM  BMP  Glucose 70 - 99 mg/dL 94  811  914   BUN 6 - 20 mg/dL 7  9  7    Creatinine 0.44 - 1.00 mg/dL 7.82  9.56  2.13   Sodium 135 - 145 mmol/L 135  136  137   Potassium 3.5 - 5.1 mmol/L 3.7  2.9  3.2   Chloride 98 - 111 mmol/L 102  98  107   CO2 22 - 32 mmol/L 25  26  21    Calcium 8.9 - 10.3 mg/dL 8.5  9.2  7.9     HEPATIC:     Latest Ref Rng & Units  08/30/2023    6:03 PM 08/29/2023    9:01 PM 04/22/2021    8:58 AM  Hepatic Function  Total Protein 6.5 - 8.1 g/dL 5.8  7.6  6.9   Albumin 3.5 - 5.0 g/dL 3.6  4.4    AST 15 - 41 U/L 46  21    ALT 0 - 44 U/L 46  21    Alk Phosphatase 38 - 126 U/L 28  31    Total Bilirubin <1.2 mg/dL 0.9  0.6      CARDIAC:  Lab Results  Component Value Date   CKTOTAL 146 03/19/2010   CKMB 1.2 03/19/2010   TROPONINI <0.01        NO INDICATION OF MYOCARDIAL INJURY. 03/19/2010      Imaging: I personally reviewed and interpreted the available labs, imaging and endoscopic files.   Assessment/Plan: 42 year old female with past medical history of anxiety, GERD, HTN, migraines, arthritis, and nonerosive gastritis, who came to the hospital after presenting recurrent episodes of nausea and vomiting with abdominal pain.  The patient had acute onset of symptoms which were preceded by a migraine, presented multiple episodes of nausea and vomiting with abdominal pain in her upper abdomen.  Vomiting led to significant hypokalemia.  CT scan of the abdomen and pelvis with IV contrast was unremarkable.  Head CT was normal as well.  Due to this, she underwent an EGD yesterday, which showed few erosions in her stomach, biopsies were taken from the stomach.  Otherwise was completely normal.  Patient has been able to advance her diet and has not presented any more vomiting or nausea episodes.  Abdominal exam is more benign today.  I suspect that her symptoms could be related to brain gut axis disorder/exacerbation of IBS in the setting of recent stressful situations.  For now, we will advance her diet and continue her on dicyclomine every 12 hours as needed.  She should continue with PPI twice daily for now.  -Bentyl as needed for abdominal pain - Pantoprazole 40 mg q12h PO - 2 large bore IV lines - Active T/S -Advance diet as tolerated.  If able to tolerate diet and with significant symptom improvement, may be discharged home  today or tomorrow morning -Zofran as needed for nausea, can use rescue doses of Phenergan as needed - Avoid NSAIDs -Follow-up pathology reports   Diane Blazing, MD Gastroenterology and Hepatology [facility]

## 2023-09-04 NOTE — Anesthesia Postprocedure Evaluation (Signed)
Anesthesia Post Note  Patient: [name]  Procedure(s) Performed: ESOPHAGOGASTRODUODENOSCOPY (EGD) WITH PROPOFOL BIOPSY  Patient location during evaluation: Phase II Anesthesia Type: General Level of consciousness: awake Pain management: pain level controlled Vital Signs Assessment: post-procedure vital signs reviewed and stable Respiratory status: spontaneous breathing and respiratory function stable Cardiovascular status: blood pressure returned to baseline and stable Postop Assessment: no headache and no apparent nausea or vomiting Anesthetic complications: no Comments: Late entry   No notable events documented.   Last Vitals:  Vitals:   09/03/23 2217 09/04/23 0406  BP: 116/68 120/75  Pulse: 72 85  Resp: 20 18  Temp: 36.8 C 36.9 C  SpO2: 100% 99%    Last Pain:  Vitals:   09/04/23 0832  TempSrc:   PainSc: 2                  Windell Norfolk

## 2023-09-06 LAB — SURGICAL PATHOLOGY

## 2023-09-09 ENCOUNTER — Encounter (HOSPITAL_COMMUNITY): Payer: Self-pay | Admitting: Gastroenterology

## 2023-09-20 DIAGNOSIS — J209 Acute bronchitis, unspecified: Secondary | ICD-10-CM | POA: Diagnosis not present

## 2023-09-20 DIAGNOSIS — R109 Unspecified abdominal pain: Secondary | ICD-10-CM | POA: Diagnosis not present

## 2023-09-20 DIAGNOSIS — G894 Chronic pain syndrome: Secondary | ICD-10-CM | POA: Diagnosis not present

## 2023-09-20 DIAGNOSIS — G43101 Migraine with aura, not intractable, with status migrainosus: Secondary | ICD-10-CM | POA: Diagnosis not present

## 2023-10-05 ENCOUNTER — Ambulatory Visit (INDEPENDENT_AMBULATORY_CARE_PROVIDER_SITE_OTHER): Payer: 59 | Admitting: Gastroenterology

## 2023-10-29 DIAGNOSIS — G894 Chronic pain syndrome: Secondary | ICD-10-CM | POA: Diagnosis not present

## 2023-11-23 ENCOUNTER — Ambulatory Visit (INDEPENDENT_AMBULATORY_CARE_PROVIDER_SITE_OTHER): Payer: Commercial Managed Care - PPO | Admitting: Gastroenterology

## 2023-12-29 DIAGNOSIS — I10 Essential (primary) hypertension: Secondary | ICD-10-CM | POA: Diagnosis not present

## 2023-12-29 DIAGNOSIS — G894 Chronic pain syndrome: Secondary | ICD-10-CM | POA: Diagnosis not present

## 2023-12-29 DIAGNOSIS — M1991 Primary osteoarthritis, unspecified site: Secondary | ICD-10-CM | POA: Diagnosis not present

## 2023-12-29 DIAGNOSIS — G43101 Migraine with aura, not intractable, with status migrainosus: Secondary | ICD-10-CM | POA: Diagnosis not present

## 2024-01-27 DIAGNOSIS — M1991 Primary osteoarthritis, unspecified site: Secondary | ICD-10-CM | POA: Diagnosis not present

## 2024-01-27 DIAGNOSIS — F419 Anxiety disorder, unspecified: Secondary | ICD-10-CM | POA: Diagnosis not present

## 2024-01-27 DIAGNOSIS — I10 Essential (primary) hypertension: Secondary | ICD-10-CM | POA: Diagnosis not present

## 2024-01-27 DIAGNOSIS — G894 Chronic pain syndrome: Secondary | ICD-10-CM | POA: Diagnosis not present

## 2024-03-23 DIAGNOSIS — H6991 Unspecified Eustachian tube disorder, right ear: Secondary | ICD-10-CM | POA: Diagnosis not present

## 2024-03-23 DIAGNOSIS — H70002 Acute mastoiditis without complications, left ear: Secondary | ICD-10-CM | POA: Diagnosis not present

## 2024-03-23 DIAGNOSIS — G510 Bell's palsy: Secondary | ICD-10-CM | POA: Diagnosis not present

## 2024-03-23 DIAGNOSIS — G43101 Migraine with aura, not intractable, with status migrainosus: Secondary | ICD-10-CM | POA: Diagnosis not present

## 2024-03-24 ENCOUNTER — Other Ambulatory Visit (HOSPITAL_COMMUNITY): Payer: Self-pay | Admitting: Internal Medicine

## 2024-03-24 DIAGNOSIS — H9190 Unspecified hearing loss, unspecified ear: Secondary | ICD-10-CM

## 2024-03-24 DIAGNOSIS — G510 Bell's palsy: Secondary | ICD-10-CM

## 2024-03-24 DIAGNOSIS — R519 Headache, unspecified: Secondary | ICD-10-CM

## 2024-03-28 ENCOUNTER — Ambulatory Visit (HOSPITAL_COMMUNITY)
Admission: RE | Admit: 2024-03-28 | Discharge: 2024-03-28 | Disposition: A | Discharge status: Home / Self Care | Source: Ambulatory Visit | Attending: Internal Medicine | Admitting: Internal Medicine

## 2024-03-28 DIAGNOSIS — R220 Localized swelling, mass and lump, head: Secondary | ICD-10-CM | POA: Diagnosis not present

## 2024-03-28 DIAGNOSIS — R519 Headache, unspecified: Secondary | ICD-10-CM | POA: Diagnosis not present

## 2024-03-28 DIAGNOSIS — G510 Bell's palsy: Secondary | ICD-10-CM | POA: Insufficient documentation

## 2024-03-28 DIAGNOSIS — H9190 Unspecified hearing loss, unspecified ear: Secondary | ICD-10-CM | POA: Diagnosis not present

## 2024-03-30 ENCOUNTER — Ambulatory Visit (HOSPITAL_COMMUNITY)

## 2024-03-30 ENCOUNTER — Other Ambulatory Visit (HOSPITAL_COMMUNITY): Payer: Self-pay | Admitting: Internal Medicine

## 2024-03-30 ENCOUNTER — Ambulatory Visit (HOSPITAL_COMMUNITY): Admission: RE | Admit: 2024-03-30 | Source: Ambulatory Visit

## 2024-03-30 DIAGNOSIS — Z1231 Encounter for screening mammogram for malignant neoplasm of breast: Secondary | ICD-10-CM

## 2024-04-03 ENCOUNTER — Ambulatory Visit (HOSPITAL_COMMUNITY)
Admission: RE | Admit: 2024-04-03 | Discharge: 2024-04-03 | Disposition: A | Discharge status: Home / Self Care | Source: Ambulatory Visit | Attending: Internal Medicine | Admitting: Internal Medicine

## 2024-04-03 DIAGNOSIS — Z1231 Encounter for screening mammogram for malignant neoplasm of breast: Secondary | ICD-10-CM | POA: Insufficient documentation

## 2024-04-13 DIAGNOSIS — M25552 Pain in left hip: Secondary | ICD-10-CM | POA: Diagnosis not present

## 2024-04-17 ENCOUNTER — Encounter (INDEPENDENT_AMBULATORY_CARE_PROVIDER_SITE_OTHER): Payer: Self-pay

## 2024-04-17 DIAGNOSIS — G43101 Migraine with aura, not intractable, with status migrainosus: Secondary | ICD-10-CM | POA: Diagnosis not present

## 2024-04-17 DIAGNOSIS — G894 Chronic pain syndrome: Secondary | ICD-10-CM | POA: Diagnosis not present

## 2024-04-17 DIAGNOSIS — M5416 Radiculopathy, lumbar region: Secondary | ICD-10-CM | POA: Diagnosis not present

## 2024-04-17 DIAGNOSIS — M1991 Primary osteoarthritis, unspecified site: Secondary | ICD-10-CM | POA: Diagnosis not present

## 2024-05-30 ENCOUNTER — Emergency Department (HOSPITAL_COMMUNITY)

## 2024-05-30 ENCOUNTER — Inpatient Hospital Stay (HOSPITAL_COMMUNITY)
Admission: EM | Admit: 2024-05-30 | Discharge: 2024-06-01 | DRG: 392 | Disposition: A | Discharge status: Home / Self Care | Attending: Internal Medicine | Admitting: Internal Medicine

## 2024-05-30 ENCOUNTER — Other Ambulatory Visit: Payer: Self-pay

## 2024-05-30 ENCOUNTER — Encounter (HOSPITAL_COMMUNITY): Payer: Self-pay

## 2024-05-30 DIAGNOSIS — I10 Essential (primary) hypertension: Secondary | ICD-10-CM | POA: Diagnosis not present

## 2024-05-30 DIAGNOSIS — K2900 Acute gastritis without bleeding: Principal | ICD-10-CM | POA: Diagnosis present

## 2024-05-30 DIAGNOSIS — Z885 Allergy status to narcotic agent status: Secondary | ICD-10-CM

## 2024-05-30 DIAGNOSIS — Z818 Family history of other mental and behavioral disorders: Secondary | ICD-10-CM | POA: Diagnosis not present

## 2024-05-30 DIAGNOSIS — K2940 Chronic atrophic gastritis without bleeding: Secondary | ICD-10-CM | POA: Diagnosis not present

## 2024-05-30 DIAGNOSIS — E860 Dehydration: Secondary | ICD-10-CM | POA: Diagnosis present

## 2024-05-30 DIAGNOSIS — Z8249 Family history of ischemic heart disease and other diseases of the circulatory system: Secondary | ICD-10-CM | POA: Diagnosis not present

## 2024-05-30 DIAGNOSIS — Z9049 Acquired absence of other specified parts of digestive tract: Secondary | ICD-10-CM | POA: Diagnosis not present

## 2024-05-30 DIAGNOSIS — K319 Disease of stomach and duodenum, unspecified: Secondary | ICD-10-CM | POA: Diagnosis not present

## 2024-05-30 DIAGNOSIS — D72829 Elevated white blood cell count, unspecified: Secondary | ICD-10-CM | POA: Diagnosis not present

## 2024-05-30 DIAGNOSIS — G8929 Other chronic pain: Secondary | ICD-10-CM | POA: Diagnosis not present

## 2024-05-30 DIAGNOSIS — R0789 Other chest pain: Secondary | ICD-10-CM | POA: Diagnosis not present

## 2024-05-30 DIAGNOSIS — Z79899 Other long term (current) drug therapy: Secondary | ICD-10-CM | POA: Diagnosis not present

## 2024-05-30 DIAGNOSIS — Z603 Acculturation difficulty: Secondary | ICD-10-CM | POA: Diagnosis present

## 2024-05-30 DIAGNOSIS — R109 Unspecified abdominal pain: Secondary | ICD-10-CM | POA: Diagnosis not present

## 2024-05-30 DIAGNOSIS — K219 Gastro-esophageal reflux disease without esophagitis: Secondary | ICD-10-CM | POA: Diagnosis not present

## 2024-05-30 DIAGNOSIS — F419 Anxiety disorder, unspecified: Secondary | ICD-10-CM | POA: Diagnosis not present

## 2024-05-30 DIAGNOSIS — Z91018 Allergy to other foods: Secondary | ICD-10-CM | POA: Diagnosis not present

## 2024-05-30 DIAGNOSIS — E876 Hypokalemia: Secondary | ICD-10-CM | POA: Diagnosis present

## 2024-05-30 DIAGNOSIS — R112 Nausea with vomiting, unspecified: Secondary | ICD-10-CM | POA: Diagnosis not present

## 2024-05-30 DIAGNOSIS — R079 Chest pain, unspecified: Secondary | ICD-10-CM | POA: Diagnosis not present

## 2024-05-30 DIAGNOSIS — K5900 Constipation, unspecified: Secondary | ICD-10-CM | POA: Diagnosis present

## 2024-05-30 DIAGNOSIS — K3189 Other diseases of stomach and duodenum: Secondary | ICD-10-CM | POA: Diagnosis not present

## 2024-05-30 DIAGNOSIS — F17290 Nicotine dependence, other tobacco product, uncomplicated: Secondary | ICD-10-CM | POA: Diagnosis present

## 2024-05-30 DIAGNOSIS — K2960 Other gastritis without bleeding: Secondary | ICD-10-CM | POA: Diagnosis not present

## 2024-05-30 DIAGNOSIS — R1010 Upper abdominal pain, unspecified: Secondary | ICD-10-CM | POA: Diagnosis not present

## 2024-05-30 DIAGNOSIS — Z833 Family history of diabetes mellitus: Secondary | ICD-10-CM | POA: Diagnosis not present

## 2024-05-30 DIAGNOSIS — R1013 Epigastric pain: Secondary | ICD-10-CM | POA: Diagnosis not present

## 2024-05-30 DIAGNOSIS — Z8719 Personal history of other diseases of the digestive system: Secondary | ICD-10-CM | POA: Diagnosis not present

## 2024-05-30 LAB — CBC WITH DIFFERENTIAL/PLATELET
Basophils Absolute: 0 K/uL (ref 0.0–0.1)
Basophils Relative: 0 %
Eosinophils Absolute: 0 K/uL (ref 0.0–0.5)
Eosinophils Relative: 0 %
HCT: 39.2 % (ref 36.0–46.0)
Hemoglobin: 13.5 g/dL (ref 12.0–15.0)
Lymphocytes Relative: 16 %
Lymphs Abs: 2.1 K/uL (ref 0.7–4.0)
MCH: 32.4 pg (ref 26.0–34.0)
MCHC: 34.4 g/dL (ref 30.0–36.0)
MCV: 94 fL (ref 80.0–100.0)
Monocytes Absolute: 0.9 K/uL (ref 0.1–1.0)
Monocytes Relative: 7 %
Neutro Abs: 10.2 K/uL — ABNORMAL HIGH (ref 1.7–7.7)
Neutrophils Relative %: 77 %
Platelets: 220 K/uL (ref 150–400)
RBC: 4.17 MIL/uL (ref 3.87–5.11)
RDW: 11.9 % (ref 11.5–15.5)
Smear Review: NORMAL
WBC: 13.3 K/uL — ABNORMAL HIGH (ref 4.0–10.5)
nRBC: 0 % (ref 0.0–0.2)

## 2024-05-30 LAB — COMPREHENSIVE METABOLIC PANEL WITH GFR
ALT: 17 U/L (ref 0–44)
AST: 21 U/L (ref 15–41)
Albumin: 4.1 g/dL (ref 3.5–5.0)
Alkaline Phosphatase: 33 U/L — ABNORMAL LOW (ref 38–126)
Anion gap: 12 (ref 5–15)
BUN: 12 mg/dL (ref 6–20)
CO2: 23 mmol/L (ref 22–32)
Calcium: 8.9 mg/dL (ref 8.9–10.3)
Chloride: 104 mmol/L (ref 98–111)
Creatinine, Ser: 0.76 mg/dL (ref 0.44–1.00)
GFR, Estimated: >60 mL/min (ref >60)
Glucose, Bld: 108 mg/dL — ABNORMAL HIGH (ref 70–99)
Potassium: 3.2 mmol/L — ABNORMAL LOW (ref 3.5–5.1)
Sodium: 139 mmol/L (ref 135–145)
Total Bilirubin: 0.8 mg/dL (ref 0.0–1.2)
Total Protein: 7 g/dL (ref 6.5–8.1)

## 2024-05-30 LAB — URINALYSIS, ROUTINE W REFLEX MICROSCOPIC
Bacteria, UA: NONE SEEN
Bilirubin Urine: NEGATIVE
Glucose, UA: 50 mg/dL — AB
Ketones, ur: 5 mg/dL — AB
Leukocytes,Ua: NEGATIVE
Nitrite: NEGATIVE
Protein, ur: NEGATIVE mg/dL
Specific Gravity, Urine: 1.038 — ABNORMAL HIGH (ref 1.005–1.030)
pH: 6 (ref 5.0–8.0)

## 2024-05-30 LAB — RAPID URINE DRUG SCREEN, HOSP PERFORMED
Amphetamines: NOT DETECTED
Barbiturates: NOT DETECTED
Benzodiazepines: POSITIVE — AB
Cocaine: NOT DETECTED
Opiates: POSITIVE — AB
Tetrahydrocannabinol: POSITIVE — AB

## 2024-05-30 LAB — TROPONIN I (HIGH SENSITIVITY)
Troponin I (High Sensitivity): 5 ng/L (ref <18)
Troponin I (High Sensitivity): 5 ng/L (ref <18)

## 2024-05-30 LAB — LIPASE, BLOOD: Lipase: 26 U/L (ref 11–51)

## 2024-05-30 LAB — MAGNESIUM: Magnesium: 1.9 mg/dL (ref 1.7–2.4)

## 2024-05-30 LAB — HCG, QUANTITATIVE, PREGNANCY: hCG, Beta Chain, Quant, S: <1 m[IU]/mL (ref <5)

## 2024-05-30 MED ORDER — TRAZODONE HCL 50 MG PO TABS: 150.0000 mg | ORAL_TABLET | Freq: Every evening | ORAL | Status: DC | PRN

## 2024-05-30 MED ORDER — HYDRALAZINE HCL 20 MG/ML IJ SOLN: 10.0000 mg | INTRAMUSCULAR | Status: DC | PRN

## 2024-05-30 MED ORDER — ACETAMINOPHEN 650 MG RE SUPP: 650.0000 mg | Freq: Four times a day (QID) | RECTAL | Status: DC | PRN

## 2024-05-30 MED ORDER — POTASSIUM CHLORIDE IN NACL 20-0.9 MEQ/L-% IV SOLN: INTRAVENOUS | Status: DC

## 2024-05-30 MED ORDER — BISACODYL 5 MG PO TBEC: 5.0000 mg | DELAYED_RELEASE_TABLET | Freq: Every day | ORAL | Status: DC | PRN

## 2024-05-30 MED ORDER — ACETAMINOPHEN 325 MG PO TABS: 650.0000 mg | ORAL_TABLET | Freq: Four times a day (QID) | ORAL | Status: DC | PRN

## 2024-05-30 MED ORDER — FENTANYL CITRATE PF 50 MCG/ML IJ SOSY
25.0000 ug | PREFILLED_SYRINGE | INTRAMUSCULAR | Status: DC | PRN
  Administered 2024-05-30 – 2024-05-31 (×5): 25 ug via INTRAVENOUS
  Filled 2024-05-30 (×7): qty 1

## 2024-05-30 MED ORDER — ONDANSETRON HCL 4 MG PO TABS
4.0000 mg | ORAL_TABLET | Freq: Four times a day (QID) | ORAL | Status: DC
  Filled 2024-05-30: qty 1

## 2024-05-30 MED ORDER — SODIUM CHLORIDE 0.9 % IV BOLUS
1000.0000 mL | Freq: Once | INTRAVENOUS | Status: AC
  Administered 2024-05-30: 1000 mL via INTRAVENOUS

## 2024-05-30 MED ORDER — PANTOPRAZOLE SODIUM 40 MG IV SOLR
40.0000 mg | Freq: Once | INTRAVENOUS | Status: AC
  Administered 2024-05-30: 40 mg via INTRAVENOUS
  Filled 2024-05-30: qty 10

## 2024-05-30 MED ORDER — POTASSIUM CHLORIDE 10 MEQ/100ML IV SOLN
10.0000 meq | Freq: Once | INTRAVENOUS | Status: AC
  Administered 2024-05-30: 10 meq via INTRAVENOUS
  Filled 2024-05-30: qty 100

## 2024-05-30 MED ORDER — ONDANSETRON HCL 4 MG/2ML IJ SOLN
4.0000 mg | Freq: Four times a day (QID) | INTRAMUSCULAR | Status: DC
  Administered 2024-05-30 – 2024-06-01 (×8): 4 mg via INTRAVENOUS
  Filled 2024-05-30 (×10): qty 2

## 2024-05-30 MED ORDER — LORAZEPAM 2 MG/ML IJ SOLN
0.5000 mg | Freq: Once | INTRAMUSCULAR | Status: AC
  Administered 2024-05-30: 0.5 mg via INTRAVENOUS
  Filled 2024-05-30: qty 1

## 2024-05-30 MED ORDER — ENOXAPARIN SODIUM 40 MG/0.4ML IJ SOSY
40.0000 mg | PREFILLED_SYRINGE | INTRAMUSCULAR | Status: DC
  Filled 2024-05-30: qty 0.4

## 2024-05-30 MED ORDER — OXYCODONE HCL 5 MG PO TABS
5.0000 mg | ORAL_TABLET | ORAL | Status: DC | PRN
  Administered 2024-05-30: 5 mg via ORAL
  Filled 2024-05-30: qty 1

## 2024-05-30 MED ORDER — LORAZEPAM 2 MG/ML IJ SOLN
0.2500 mg | INTRAMUSCULAR | Status: DC | PRN
  Administered 2024-05-31 – 2024-06-01 (×4): 0.25 mg via INTRAVENOUS
  Filled 2024-05-30 (×5): qty 1

## 2024-05-30 MED ORDER — FENTANYL CITRATE (PF) 100 MCG/2ML IJ SOLN: 25.0000 ug | INTRAMUSCULAR | Status: DC | PRN

## 2024-05-30 MED ORDER — HYDROMORPHONE HCL 1 MG/ML IJ SOLN
0.5000 mg | Freq: Once | INTRAMUSCULAR | Status: AC
  Administered 2024-05-30: 0.5 mg via INTRAVENOUS
  Filled 2024-05-30: qty 0.5

## 2024-05-30 MED ORDER — PROCHLORPERAZINE EDISYLATE 10 MG/2ML IJ SOLN
10.0000 mg | INTRAMUSCULAR | Status: DC | PRN
  Administered 2024-05-30 – 2024-06-01 (×4): 10 mg via INTRAVENOUS
  Filled 2024-05-30 (×4): qty 2

## 2024-05-30 MED ORDER — GABAPENTIN 300 MG PO CAPS
600.0000 mg | ORAL_CAPSULE | Freq: Four times a day (QID) | ORAL | Status: DC
Start: 2024-05-30 — End: 2024-06-01
  Administered 2024-05-30 – 2024-06-01 (×3): 600 mg via ORAL
  Filled 2024-05-30 (×5): qty 2

## 2024-05-30 MED ORDER — IOHEXOL 300 MG/ML  SOLN
100.0000 mL | Freq: Once | INTRAMUSCULAR | Status: AC | PRN
  Administered 2024-05-30: 100 mL via INTRAVENOUS

## 2024-05-30 MED ORDER — SUCRALFATE 1 G PO TABS
1.0000 g | ORAL_TABLET | Freq: Three times a day (TID) | ORAL | Status: DC
  Administered 2024-05-30: 1 g via ORAL
  Filled 2024-05-30 (×4): qty 1

## 2024-05-30 MED ORDER — PANTOPRAZOLE SODIUM 40 MG IV SOLR
40.0000 mg | Freq: Two times a day (BID) | INTRAVENOUS | Status: DC
  Administered 2024-05-31 – 2024-06-01 (×3): 40 mg via INTRAVENOUS
  Filled 2024-05-30 (×4): qty 10

## 2024-05-30 MED ORDER — ONDANSETRON HCL 4 MG/2ML IJ SOLN
4.0000 mg | Freq: Once | INTRAMUSCULAR | Status: AC
  Administered 2024-05-30: 4 mg via INTRAVENOUS
  Filled 2024-05-30: qty 2

## 2024-05-30 MED ORDER — TRAZODONE HCL 50 MG PO TABS
150.0000 mg | ORAL_TABLET | Freq: Every day | ORAL | Status: DC
  Administered 2024-05-31: 150 mg via ORAL
  Filled 2024-05-30 (×2): qty 3

## 2024-05-30 NOTE — Plan of Care (Signed)

## 2024-05-30 NOTE — Hospital Course (Addendum)
 43 year old female with history of anxiety, hypertension, erosive gastritis, GERD, migraine headaches, intermittent episodes of intractable nausea and vomiting presented to the emergency department complaining of severe vomiting for past 24 hours and left-sided chest pain radiating down left arm.  Patient reports having acid reflux symptoms that radiate up into the mouth and back of throat similar to when she was admitted last year and EGD showed severe erosive gastritis at that time.  She has been taking Protonix  but has completed her course of Carafate .  She reports that she has not been able to keep down anything including fluids.  She denies seeing blood in the emesis.  She was given some supportive treatments in the ED and symptoms persisted.  She has some hypokalemia noted and clinically appeared dehydrated.  High-sensitivity troponin tests normal.  WBC elevated at 13.3.  CT performed of the abdomen and pelvis with contrast was reassuring of no acute findings.  Urinalysis did not show infection.  Admission was requested for further management.

## 2024-05-30 NOTE — ED Triage Notes (Signed)
 Reports vomiting x 24 hours and then on the way here started having left sided chest pain that radiates down her left arm.  Patient reports unable to hold anything down.  Reports abd pain associated with vomiting. Denies diarrhea

## 2024-05-30 NOTE — ED Provider Notes (Signed)
 [place] EMERGENCY DEPARTMENT AT [facility] Provider Note   CSN: [id number] Arrival date & time: 05/30/24  9166     Patient presents with: Abdominal Pain and Chest Pain   [name] is a 43 y.o. female.  {Add pertinent medical, surgical, social history, OB history to YEP:67052} Patient presents with epigastric pain and vomiting since Monday.  She has a history of severe gastritis and was admitted last year   Abdominal Pain Associated symptoms: chest pain   Chest Pain Associated symptoms: abdominal pain        Prior to Admission medications   Medication Sig Start Date End Date Taking? Authorizing Provider  gabapentin  (NEURONTIN ) 600 MG tablet Take 600 mg by mouth 4 (four) times daily. 04/17/24  Yes [provider]  HYDROcodone -acetaminophen  (NORCO) 10-325 MG tablet Take 1-2 tablets by mouth every 6 (six) hours as needed for moderate pain (pain score 4-6). 09/04/23  Yes Ricky Fines, MD  Multiple Vitamins-Minerals (MULTIVITAMIN ADULT) CHEW Chew 2 each by mouth daily.   Yes [provider]  ondansetron  (ZOFRAN -ODT) 8 MG disintegrating tablet Take 1 tablet (8 mg total) by mouth every 8 (eight) hours as needed. 09/04/23  Yes Ricky Fines, MD  pantoprazole  (PROTONIX ) 40 MG tablet Take 1 tablet (40 mg total) by mouth 2 (two) times daily. 09/04/23  Yes Ricky Fines, MD  promethazine  (PHENERGAN ) 25 MG suppository Place 1 suppository (25 mg total) rectally every 6 (six) hours as needed for refractory nausea / vomiting. 09/04/23  Yes Ricky Fines, MD  trazodone  (DESYREL ) 300 MG tablet Take 300 mg by mouth daily. 08/10/23  Yes [provider]  NURTEC 75 MG TBDP Take 1 tablet by mouth every other day. Patient not taking: Reported on 09/02/2023 08/26/23   [provider]  sucralfate  (CARAFATE ) 1 g tablet Take 1 tablet (1 g total) by mouth in the morning, at noon, and at bedtime for 10 days. Patient not taking: Reported on 05/30/2024  09/04/23 09/14/23  Ricky Fines, MD    Allergies: Demerol and Orange fruit [citrus]    Review of Systems  Cardiovascular:  Positive for chest pain.  Gastrointestinal:  Positive for abdominal pain.    Updated Vital Signs BP (!) 104/93   Pulse 74   Temp 97.9 F (36.6 C) (Oral)   Resp 14   Ht 5' 3 (1.6 m)   Wt 54.9 kg   SpO2 97%   BMI 21.43 kg/m   Physical Exam  (all labs ordered are listed, but only abnormal results are displayed) Labs Reviewed  COMPREHENSIVE METABOLIC PANEL WITH GFR - Abnormal; Notable for the following components:      Result Value   Potassium 3.2 (*)    Glucose, Bld 108 (*)    Alkaline Phosphatase 33 (*)    All other components within normal limits  CBC WITH DIFFERENTIAL/PLATELET - Abnormal; Notable for the following components:   WBC 13.3 (*)    Neutro Abs 10.2 (*)    All other components within normal limits  LIPASE, BLOOD  HCG, QUANTITATIVE, PREGNANCY  URINALYSIS, ROUTINE W REFLEX MICROSCOPIC  TROPONIN I (HIGH SENSITIVITY)  TROPONIN I (HIGH SENSITIVITY)    EKG: EKG Interpretation Date/Time:  Tuesday May 30 2024 08:46:05 EDT Ventricular Rate:  74 PR Interval:  121 QRS Duration:  100 QT Interval:  446 QTC Calculation: 495 R Axis:   73  Text Interpretation: Sinus rhythm Borderline prolonged QT interval Confirmed by Suzette Pac [phone]) on 05/30/2024 8:52:58 AM  Radiology: CT ABDOMEN  PELVIS W CONTRAST Result Date: 05/30/2024 CLINICAL DATA:  Abdominal pain, acute, nonlocalized Vomiting for 24 hours with more recent onset of left-sided chest pain radiating into the left arm. EXAM: CT ABDOMEN AND PELVIS WITH CONTRAST TECHNIQUE: Multidetector CT imaging of the abdomen and pelvis was performed using the standard protocol following bolus administration of intravenous contrast. RADIATION DOSE REDUCTION: This exam was performed according to the departmental dose-optimization program which includes automated exposure control, adjustment of the mA  and/or kV according to patient size and/or use of iterative reconstruction technique. CONTRAST:  OMNIPAQUE  IOHEXOL  300 MG/ML  SOLN COMPARISON:  Abdominopelvic CT 08/30/2023. FINDINGS: Lower chest: Stable small calcified granuloma medially in the right lower lobe. The lung bases are otherwise clear. No significant pleural or pericardial effusion. Hepatobiliary: The liver is normal in density without suspicious focal abnormality. No significant biliary dilatation status post cholecystectomy. Pancreas: Unremarkable. No pancreatic ductal dilatation or surrounding inflammatory changes. Spleen: Normal in size without focal abnormality. Adrenals/Urinary Tract: Both adrenal glands appear normal. No evidence of urinary tract calculus, suspicious renal lesion or hydronephrosis. The bladder appears unremarkable for its degree of distention. Stomach/Bowel: No enteric contrast administered. The stomach appears unremarkable for its degree of distension. No evidence of bowel wall thickening, distention or surrounding inflammatory change. The appendix appears normal. Vascular/Lymphatic: There are no enlarged abdominal or pelvic lymph nodes. Mild aortic atherosclerosis without evidence of aneurysm or large vessel occlusion. Reproductive: The uterus and ovaries appear unchanged. There are no suspicious adnexal findings. Stable small left ovarian vein varices. Other: No evidence of abdominal wall mass or hernia. No ascites or pneumoperitoneum. Musculoskeletal: No acute or significant osseous findings. Mild facet arthropathy in the lower lumbar spine. Previous left femoral ORIF. IMPRESSION: 1. No acute findings or explanation for the patient's symptoms. 2. Previous cholecystectomy without biliary dilatation. 3. Stable small left ovarian vein varices. 4.  Aortic Atherosclerosis (ICD10-I70.0). Electronically Signed   By: Elsie Perone M.D.   On: 05/30/2024 11:22   DG Chest Port 1 View Result Date: 05/30/2024 CLINICAL DATA:   pain EXAM: PORTABLE CHEST - 1 VIEW COMPARISON:  September 02, 2023 FINDINGS: No focal airspace consolidation, pleural effusion, or pneumothorax. No cardiomegaly. No acute fracture or destructive lesion. IMPRESSION: No acute cardiopulmonary abnormality. Electronically Signed   By: Rogelia Myers M.D.   On: 05/30/2024 09:52    {Document cardiac monitor, telemetry assessment procedure when appropriate:32947} Procedures   Medications Ordered in the ED  potassium chloride  10 mEq in 100 mL IVPB (has no administration in time range)  HYDROmorphone  (DILAUDID ) injection 0.5 mg (0.5 mg Intravenous Given 05/30/24 0919)  ondansetron  (ZOFRAN ) injection 4 mg (4 mg Intravenous Given 05/30/24 0918)  LORazepam  (ATIVAN ) injection 0.5 mg (0.5 mg Intravenous Given 05/30/24 0913)  pantoprazole  (PROTONIX ) injection 40 mg (40 mg Intravenous Given 05/30/24 0915)  sodium chloride  0.9 % bolus 1,000 mL (0 mLs Intravenous Stopped 05/30/24 1057)  sodium chloride  0.9 % bolus 1,000 mL (1,000 mLs Intravenous New Bag/Given 05/30/24 1058)  HYDROmorphone  (DILAUDID ) injection 0.5 mg (0.5 mg Intravenous Given 05/30/24 1059)  iohexol  (OMNIPAQUE ) 300 MG/ML solution 100 mL (100 mLs Intravenous Contrast Given 05/30/24 1012)      {Click here for ABCD2, HEART and other calculators REFRESH Note before signing:1}                              Medical Decision Making Amount and/or Complexity of Data Reviewed Labs: ordered. Radiology: ordered.  Risk Prescription  drug management. Decision regarding hospitalization.   Patient with abdominal pain most likely from severe gastritis.  She is admitted to medicine  {Document critical care time when appropriate  Document review of labs and clinical decision tools ie CHADS2VASC2, etc  Document your independent review of radiology images and any outside records  Document your discussion with family members, caretakers and with consultants  Document social determinants of health affecting pt's care   Document your decision making why or why not admission, treatments were needed:32947:::1}   Final diagnoses:  Acute superficial gastritis without hemorrhage    ED Discharge Orders     None

## 2024-05-30 NOTE — H&P (Addendum)
 History and Physical  [facility]  [name] FMW:[id number] DOB: [birth year] DOA: 05/30/2024  [facility]P: [facility]  Patient coming from: Home  Level of care: Med-Surg  I have personally briefly reviewed patient's old medical records in [facility]  Chief Complaint: nausea and vomiting (severe)  HPI: [name] is a 43 year old female with history of anxiety, hypertension, erosive gastritis, GERD, migraine headaches, intermittent episodes of intractable nausea and vomiting presented to the emergency department complaining of severe vomiting for past 24 hours and left-sided chest pain radiating down left arm.  Patient reports having acid reflux symptoms that radiate up into the mouth and back of throat similar to when she was admitted last year and EGD showed severe erosive gastritis at that time.  She has been taking Protonix  but has completed her course of Carafate .  She reports that she has not been able to keep down anything including fluids.  She denies seeing blood in the emesis.  She was given some supportive treatments in the ED and symptoms persisted.  She has some hypokalemia noted and clinically appeared dehydrated.  High-sensitivity troponin tests normal.  WBC elevated at 13.3.  CT performed of the abdomen and pelvis with contrast was reassuring of no acute findings.  Urinalysis did not show infection.  Admission was requested for further management.   Past Medical History:  Diagnosis Date   Acid reflux    Anxiety    Arthritis    Environmental allergies    Hypertension    Migraine     Past Surgical History:  Procedure Laterality Date   arm surgery Left    plate in arm   BILATERAL SALPINGECTOMY     BIOPSY  09/03/2023   Procedure: BIOPSY;  Surgeon: Eartha Angelia Sieving, MD;  Location: [facility];  Service: Gastroenterology;;   BONE BIOPSY Right    thumb   BRAIN SURGERY     evacuation of hematoma   CESAREAN SECTION     x2    CHOLECYSTECTOMY     ECTOPIC PREGNANCY SURGERY     x2   ENDOMETRIAL ABLATION  01/18/2018   Procedure: ENDOMETRIAL ABLATION WITH MINERVA;  Surgeon: Edsel Norleen GAILS, MD;  Location: [facility] ORS;  Service: Gynecology;;   ESOPHAGOGASTRODUODENOSCOPY (EGD) WITH PROPOFOL  N/A 01/30/2014   Procedure: ESOPHAGOGASTRODUODENOSCOPY (EGD) WITH PROPOFOL ;  Surgeon: Margo LITTIE Haddock, MD;  Location: [facility] ORS;  Service: Endoscopy;  Laterality: N/A;   ESOPHAGOGASTRODUODENOSCOPY (EGD) WITH PROPOFOL  N/A 09/03/2023   Procedure: ESOPHAGOGASTRODUODENOSCOPY (EGD) WITH PROPOFOL ;  Surgeon: Eartha Angelia Sieving, MD;  Location: [facility];  Service: Gastroenterology;  Laterality: N/A;   HYSTEROSCOPY WITH D & C N/A 08/13/2015   Procedure: DILATATION AND CURETTAGE /HYSTEROSCOPY;  Surgeon: Norleen Edsel GAILS, MD;  Location: [facility] ORS;  Service: Gynecology;  Laterality: N/A;   HYSTEROSCOPY WITH D & C  01/18/2018   Procedure: CERVICAL DILATATION /HYSTEROSCOPY;  Surgeon: Edsel Norleen GAILS, MD;  Location: [facility] ORS;  Service: Gynecology;;   LEG SURGERY Left    femur rodding and removal of part of left femur   otif ankle Right      reports that she quit smoking about 12 years ago. Her smoking use included cigarettes. She started smoking about 32 years ago. She has a 20 pack-year smoking history. She has never used smokeless tobacco. She reports that she does not drink alcohol and does not use drugs.  Allergies  Allergen Reactions   Demerol Nausea Only   Orange Fruit [Citrus]  Fever blisters    Family History  Problem Relation Age of Onset   Mental illness Mother    Diabetes Mother    Obesity Mother    Heart disease Father    Obesity Sister    Lupus Paternal Aunt    Cancer Maternal Grandfather        GASTRIC   Lupus Paternal Grandmother    Diabetes Son        boarderline   Arthritis Other    Cancer Other    Diabetes Other     Prior to Admission medications   Medication Sig Start Date End Date Taking? Authorizing Provider   gabapentin  (NEURONTIN ) 600 MG tablet Take 600 mg by mouth 4 (four) times daily. 04/17/24  Yes [provider]  HYDROcodone -acetaminophen  (NORCO) 10-325 MG tablet Take 1-2 tablets by mouth every 6 (six) hours as needed for moderate pain (pain score 4-6). 09/04/23  Yes Ricky Fines, MD  Multiple Vitamins-Minerals (MULTIVITAMIN ADULT) CHEW Chew 2 each by mouth daily.   Yes [provider]  ondansetron  (ZOFRAN -ODT) 8 MG disintegrating tablet Take 1 tablet (8 mg total) by mouth every 8 (eight) hours as needed. 09/04/23  Yes Ricky Fines, MD  pantoprazole  (PROTONIX ) 40 MG tablet Take 1 tablet (40 mg total) by mouth 2 (two) times daily. 09/04/23  Yes Ricky Fines, MD  promethazine  (PHENERGAN ) 25 MG suppository Place 1 suppository (25 mg total) rectally every 6 (six) hours as needed for refractory nausea / vomiting. 09/04/23  Yes Ricky Fines, MD  trazodone  (DESYREL ) 300 MG tablet Take 300 mg by mouth daily. 08/10/23  Yes [provider]  NURTEC 75 MG TBDP Take 1 tablet by mouth every other day. Patient not taking: Reported on 09/02/2023 08/26/23   [provider]  sucralfate  (CARAFATE ) 1 g tablet Take 1 tablet (1 g total) by mouth in the morning, at noon, and at bedtime for 10 days. Patient not taking: Reported on 05/30/2024 09/04/23 09/14/23  Ricky Fines, MD    Physical Exam: Vitals:   05/30/24 1145 05/30/24 1200 05/30/24 1215 05/30/24 1230  BP: (!) 104/93 122/81 116/75 116/64  Pulse: 74 73 73 74  Resp: 14 12 14 16   Temp:      TempSrc:      SpO2: 97% 97% 94% 97%  Weight:      Height:       Constitutional: NAD, calm, comfortable Eyes: PERRL, lids and conjunctivae normal ENMT: Mucous membranes are dry. Posterior pharynx clear of any exudate or lesions.  Neck: normal, supple, no masses, no thyromegaly Respiratory: clear to auscultation bilaterally, no wheezing, no crackles. Normal respiratory effort. No accessory muscle use.  Cardiovascular: normal s1, s2  sounds, no murmurs / rubs / gallops. No extremity edema. 2+ pedal pulses. No carotid bruits.  Abdomen: no tenderness, no masses palpated. No hepatosplenomegaly. Bowel sounds positive.  Musculoskeletal: no clubbing / cyanosis. No joint deformity upper and lower extremities. Good ROM, no contractures. Normal muscle tone.  Skin: no rashes, lesions, ulcers. No induration Neurologic: CN 2-12 grossly intact. Sensation intact, DTR normal. Strength 5/5 in all 4.  Psychiatric: Normal judgment and insight. Alert and oriented x 3. Normal mood.   Labs on Admission: I have personally reviewed following labs and imaging studies  CBC: Recent Labs  Lab 05/30/24 0851  WBC 13.3*  NEUTROABS 10.2*  HGB 13.5  HCT 39.2  MCV 94.0  PLT 220   Basic Metabolic Panel: Recent Labs  Lab 05/30/24 0851 05/30/24 1024  NA 139  --  K 3.2*  --   CL 104  --   CO2 23  --   GLUCOSE 108*  --   BUN 12  --   CREATININE 0.76  --   CALCIUM 8.9  --   MG  --  1.9   GFR: Estimated Creatinine Clearance: 74.2 mL/min (by C-G formula based on SCr of 0.76 mg/dL). Liver Function Tests: Recent Labs  Lab 05/30/24 0851  AST 21  ALT 17  ALKPHOS 33*  BILITOT 0.8  PROT 7.0  ALBUMIN 4.1   Recent Labs  Lab 05/30/24 0851  LIPASE 26   No results for input(s): AMMONIA in the last 168 hours. Coagulation Profile: No results for input(s): INR, PROTIME in the last 168 hours. Cardiac Enzymes: No results for input(s): CKTOTAL, CKMB, CKMBINDEX, TROPONINI in the last 168 hours. BNP (last 3 results) No results for input(s): PROBNP in the last 8760 hours. HbA1C: No results for input(s): HGBA1C in the last 72 hours. CBG: No results for input(s): GLUCAP in the last 168 hours. Lipid Profile: No results for input(s): CHOL, HDL, LDLCALC, TRIG, CHOLHDL, LDLDIRECT in the last 72 hours. Thyroid  Function Tests: No results for input(s): TSH, T4TOTAL, FREET4, T3FREE, THYROIDAB in the last 72  hours. Anemia Panel: No results for input(s): VITAMINB12, FOLATE, FERRITIN, TIBC, IRON, RETICCTPCT in the last 72 hours. Urine analysis:    Component Value Date/Time   COLORURINE YELLOW 05/30/2024 1147   APPEARANCEUR CLEAR 05/30/2024 1147   LABSPEC 1.038 (H) 05/30/2024 1147   PHURINE 6.0 05/30/2024 1147   GLUCOSEU 50 (A) 05/30/2024 1147   HGBUR SMALL (A) 05/30/2024 1147   BILIRUBINUR NEGATIVE 05/30/2024 1147   KETONESUR 5 (A) 05/30/2024 1147   PROTEINUR NEGATIVE 05/30/2024 1147   UROBILINOGEN 0.2 08/09/2015 0846   NITRITE NEGATIVE 05/30/2024 1147   LEUKOCYTESUR NEGATIVE 05/30/2024 1147    Radiological Exams on Admission: CT ABDOMEN PELVIS W CONTRAST Result Date: 05/30/2024 CLINICAL DATA:  Abdominal pain, acute, nonlocalized Vomiting for 24 hours with more recent onset of left-sided chest pain radiating into the left arm. EXAM: CT ABDOMEN AND PELVIS WITH CONTRAST TECHNIQUE: Multidetector CT imaging of the abdomen and pelvis was performed using the standard protocol following bolus administration of intravenous contrast. RADIATION DOSE REDUCTION: This exam was performed according to the departmental dose-optimization program which includes automated exposure control, adjustment of the mA and/or kV according to patient size and/or use of iterative reconstruction technique. CONTRAST:  OMNIPAQUE  IOHEXOL  300 MG/ML  SOLN COMPARISON:  Abdominopelvic CT 08/30/2023. FINDINGS: Lower chest: Stable small calcified granuloma medially in the right lower lobe. The lung bases are otherwise clear. No significant pleural or pericardial effusion. Hepatobiliary: The liver is normal in density without suspicious focal abnormality. No significant biliary dilatation status post cholecystectomy. Pancreas: Unremarkable. No pancreatic ductal dilatation or surrounding inflammatory changes. Spleen: Normal in size without focal abnormality. Adrenals/Urinary Tract: Both adrenal glands appear normal. No  evidence of urinary tract calculus, suspicious renal lesion or hydronephrosis. The bladder appears unremarkable for its degree of distention. Stomach/Bowel: No enteric contrast administered. The stomach appears unremarkable for its degree of distension. No evidence of bowel wall thickening, distention or surrounding inflammatory change. The appendix appears normal. Vascular/Lymphatic: There are no enlarged abdominal or pelvic lymph nodes. Mild aortic atherosclerosis without evidence of aneurysm or large vessel occlusion. Reproductive: The uterus and ovaries appear unchanged. There are no suspicious adnexal findings. Stable small left ovarian vein varices. Other: No evidence of abdominal wall mass or hernia. No ascites or pneumoperitoneum. Musculoskeletal:  No acute or significant osseous findings. Mild facet arthropathy in the lower lumbar spine. Previous left femoral ORIF. IMPRESSION: 1. No acute findings or explanation for the patient's symptoms. 2. Previous cholecystectomy without biliary dilatation. 3. Stable small left ovarian vein varices. 4.  Aortic Atherosclerosis (ICD10-I70.0). Electronically Signed   By: Elsie Perone M.D.   On: 05/30/2024 11:22   DG Chest Port 1 View Result Date: 05/30/2024 CLINICAL DATA:  pain EXAM: PORTABLE CHEST - 1 VIEW COMPARISON:  September 02, 2023 FINDINGS: No focal airspace consolidation, pleural effusion, or pneumothorax. No cardiomegaly. No acute fracture or destructive lesion. IMPRESSION: No acute cardiopulmonary abnormality. Electronically Signed   By: Rogelia Myers M.D.   On: 05/30/2024 09:52    EKG: Independently reviewed.   Assessment/Plan Active Problems:   Intractable nausea and vomiting   Chronic erosive gastritis   Chronic pain   Chronic anxiety   Hypertension   Hypokalemia   Leukocytosis   Intractable nausea and vomiting -- suspect recurrent bout of acute on chronic erosive gastritis -- admit to help calm symptoms with supportive care  -- IV  fluids -- IV nausea meds ordered around the clock -- IV pantoprazole  ordered -- ice chips and clears for now  Chronic erosive gastritis -- IV pantoprazole  40 mg BID ordered -- restarted carafate   -- if symptoms persist will get GI eval  Hypokalemia -- check magnesium  -- IV replacement ordered -- added supplemental K to IV fluids -- recheck in AM   Chronic anxiety disorder -- PRN anxiolytics ordered  Leukocytosis -- reactive from severe emesis -- supportive measures -- recheck in AM   Chronic Pain -- pain meds ordered as needed   Primary Essential HTN -- not on home meds for this -- elevated BPs noted -- IV hydralazine  ordered PRN given inability to take oral   DVT prophylaxis: enoxaparin    Code Status: Full   Family Communication:   Disposition Plan:   Consults called:   Admission status: INP Time spent: 57 mins  Level of care: Med-Surg Afton Louder MD [facility] How to contact the [facility] Attending or Consulting provider 7A - 7P or covering provider during after hours 7P -7A, for this patient?  Check the care team in [facility] and look for a) attending/consulting [facility] provider listed and b) the [facility] team listed Log into [email or web address] and use [place]'s universal password to access. If you do not have the password, please contact the hospital operator. Locate the [facility] provider you are looking for under [facility] and page to a number that you can be directly reached. If you still have difficulty reaching the provider, please page the [facility] (Director on Call) for the Hospitalists listed on amion for assistance.   If 7PM-7AM, please contact night-coverage [email or web address] Password [facility]  05/30/2024, 12:44 PM

## 2024-05-31 DIAGNOSIS — R112 Nausea with vomiting, unspecified: Secondary | ICD-10-CM | POA: Diagnosis not present

## 2024-05-31 LAB — CBC WITH DIFFERENTIAL/PLATELET
Abs Immature Granulocytes: 0.08 K/uL — ABNORMAL HIGH (ref 0.00–0.07)
Basophils Absolute: 0 K/uL (ref 0.0–0.1)
Basophils Relative: 0 %
Eosinophils Absolute: 0 K/uL (ref 0.0–0.5)
Eosinophils Relative: 0 %
HCT: 38 % (ref 36.0–46.0)
Hemoglobin: 12.7 g/dL (ref 12.0–15.0)
Immature Granulocytes: 1 %
Lymphocytes Relative: 9 %
Lymphs Abs: 1.1 K/uL (ref 0.7–4.0)
MCH: 31.9 pg (ref 26.0–34.0)
MCHC: 33.4 g/dL (ref 30.0–36.0)
MCV: 95.5 fL (ref 80.0–100.0)
Monocytes Absolute: 0.6 K/uL (ref 0.1–1.0)
Monocytes Relative: 5 %
Neutro Abs: 10.4 K/uL — ABNORMAL HIGH (ref 1.7–7.7)
Neutrophils Relative %: 85 %
Platelets: 195 K/uL (ref 150–400)
RBC: 3.98 MIL/uL (ref 3.87–5.11)
RDW: 11.7 % (ref 11.5–15.5)
WBC: 12.3 K/uL — ABNORMAL HIGH (ref 4.0–10.5)
nRBC: 0 % (ref 0.0–0.2)

## 2024-05-31 LAB — BASIC METABOLIC PANEL WITH GFR
Anion gap: 12 (ref 5–15)
BUN: 8 mg/dL (ref 6–20)
CO2: 22 mmol/L (ref 22–32)
Calcium: 8.4 mg/dL — ABNORMAL LOW (ref 8.9–10.3)
Chloride: 105 mmol/L (ref 98–111)
Creatinine, Ser: 0.7 mg/dL (ref 0.44–1.00)
GFR, Estimated: >60 mL/min (ref >60)
Glucose, Bld: 118 mg/dL — ABNORMAL HIGH (ref 70–99)
Potassium: 3.3 mmol/L — ABNORMAL LOW (ref 3.5–5.1)
Sodium: 139 mmol/L (ref 135–145)

## 2024-05-31 LAB — MAGNESIUM: Magnesium: 1.6 mg/dL — ABNORMAL LOW (ref 1.7–2.4)

## 2024-05-31 MED ORDER — MAGNESIUM SULFATE 2 GM/50ML IV SOLN
2.0000 g | Freq: Once | INTRAVENOUS | Status: AC
  Administered 2024-05-31: 2 g via INTRAVENOUS
  Filled 2024-05-31: qty 50

## 2024-05-31 MED ORDER — ENOXAPARIN SODIUM 40 MG/0.4ML IJ SOSY
40.0000 mg | PREFILLED_SYRINGE | INTRAMUSCULAR | Status: DC
  Administered 2024-05-31: 40 mg via SUBCUTANEOUS
  Filled 2024-05-31: qty 0.4

## 2024-05-31 MED ORDER — POTASSIUM CHLORIDE CRYS ER 20 MEQ PO TBCR
40.0000 meq | EXTENDED_RELEASE_TABLET | Freq: Once | ORAL | Status: DC
  Filled 2024-05-31: qty 2

## 2024-05-31 MED ORDER — HYDROMORPHONE HCL 1 MG/ML IJ SOLN
0.3000 mg | INTRAMUSCULAR | Status: DC | PRN
  Administered 2024-05-31 – 2024-06-01 (×7): 0.3 mg via INTRAVENOUS
  Filled 2024-05-31 (×8): qty 0.5

## 2024-05-31 MED ORDER — KCL IN DEXTROSE-NACL 40-5-0.45 MEQ/L-%-% IV SOLN: INTRAVENOUS | Status: DC

## 2024-05-31 NOTE — Plan of Care (Signed)
   Problem: Activity: Goal: Risk for activity intolerance will decrease Outcome: Progressing   Problem: Coping: Goal: Level of anxiety will decrease Outcome: Progressing   Problem: Pain Managment: Goal: General experience of comfort will improve and/or be controlled Outcome: Progressing

## 2024-05-31 NOTE — Progress Notes (Signed)
 Pt's pain and nausea both much better controlled since IV pain med changed to dilaudid . Pt was able to take of broth from her supper tray with no associated nausea and no vomiting. Pt refused all oral meds today due to nausea, MD aware. New IV site with fluids infusing without difficulty. Pt did ambulate hallways this evening, >500 ft and tolerated well. Pt states is now passing some flatus and burping has decreased.

## 2024-05-31 NOTE — TOC CM/SW Note (Signed)
 Transition of Care [facility]) - Inpatient Brief Assessment   Patient Details  Name: [name] MRN: [id number] Date of Birth: September 15, 1981  [facility] of Care [facility]) CM/SW Contact:    Noreen KATHEE Pinal, LCSWA Phone Number: 05/31/2024, 9:22 AM   Clinical Narrative:  Transition of Care Department [facility]) has reviewed patient and no TOC needs have been identified at this time. We will continue to monitor patient advancement through interdisciplinary progression rounds. If new patient transition needs arise, please place a TOC consult.   Transition of Care Asessment: Insurance and Status: Insurance coverage has been reviewed Patient has primary care physician: Yes Home environment has been reviewed: Single Family Home with spouse Prior level of function:: Independent Prior/Current Home Services: No current home services Social Drivers of Health Review: SDOH reviewed no interventions necessary Readmission risk has been reviewed: Yes Transition of care needs: no transition of care needs at this time

## 2024-05-31 NOTE — Progress Notes (Signed)
 Pt currently resting in bed with eyes closed, resp deep and non-labored. Pt easily awakened, oriented x4. Pt received IV Ativan  for anxiety earlier this am, states it has helped her rest after her shower. Pt c/o abd pain rated 8/10. When asked to show this nurse where in her abd she is hurting, pt noted to be asleep again with regular respirations. Skin W&D. IVF infusing without s/s infiltration.

## 2024-05-31 NOTE — Progress Notes (Signed)
 Pt could not tolerate ten o'clock evening scheduled meds, requested they be placed on hold. Pt stated she was experiencing severe abdomen pain and vomiting. Administered Compazine . Tolerated well, but continued to feel nauseated and pain. Recommended a hot pack placed on abdomen pain, tolerated well, stated it helped ease the pain and made it feel slightly better. Currently asleep, rise and fall of chest visualized. Will continue to monitor pt for discomfort.

## 2024-05-31 NOTE — Progress Notes (Signed)
 Progress Note   Patient: [name] DOB: [birth year] DOA: 05/30/2024     1 DOS: the patient was seen and examined on 05/31/2024   Brief hospital course: 43 y o woman with PMH of anxiety, hypertension, erosive gastritis, GERD, migraine headaches, intermittent episodes of intractable nausea and vomiting who presented to the emergency department complaining of severe vomiting for past 24 hours and left-sided chest pain radiating down left arm.  Patient reported having acid reflux symptoms that radiate up into the mouth and back of throat similar to when she was admitted last year and EGD showed severe erosive gastritis at that time.   High-sensitivity troponin tests normal.  WBC elevated at 13.3.  CT performed of the abdomen and pelvis with contrast was reassuring with no acute findings.  Urinalysis did not show infection.  Admitted  for further management.  Assessment and Plan:  Intractable nausea and vomiting -- suspect recurrent bout of acute on chronic erosive gastritis -- IV fluids -- IV nausea meds ordered around the clock -- IV pantoprazole  ordered -- ice chips and clears for now -- for GI consult if patient's symptoms do not improve.    Chronic erosive gastritis -- IV pantoprazole  40 mg BID ordered -- restarted carafate   -- if symptoms persist will get GI eval   Hypokalemia -- IV replacement ordered -- added supplemental K to IV fluids -- will monitor daily  Hypomagnesemia - IV repletion as needed.    Chronic anxiety disorder -- PRN anxiolytics ordered   Leukocytosis -- likely reactive from severe emesis -- supportive measures -- recheck in AM    Chronic Pain -- pain meds ordered as needed    Elevated blood pressure -- not on home meds for this -- elevated BPs noted -- IV hydralazine  ordered PRN given inability to take oral      Subjective: Patient complains of ongoing nausea. She is not able to tolerate PO intake.   Physical Exam: Vitals:    05/30/24 2032 05/31/24 0011 05/31/24 0415 05/31/24 1349  BP: 125/81 (!) 162/97 (!) 148/83 (!) 153/89  Pulse: 64 (!) 58 74 68  Resp: 18 17 16 17   Temp: 98.8 F (37.1 C) 98 F (36.7 C) 99.6 F (37.6 C) 99.2 F (37.3 C)  TempSrc: Oral Oral Oral Oral  SpO2: 98% 98% 100% 99%  Weight:      Height:       Physical Exam   General: Alert, oriented X3. Appears uncomfortable.  Eyes: Pupils equal, reactive  Oral cavity: moist mucous membranes  Head: Atraumatic, normocephalic  Neck: supple  Chest: clear to auscultation. No crackles, no wheezes  CVS: S1,S2 RRR. No murmurs  Abd: No distention, soft, non-tender. No masses palpable  Extr: No edema   MSK: No joint deformities or swelling  Neurological: Grossly intact.    Data Reviewed:      Latest Ref Rng & Units 05/31/2024    5:01 AM 05/30/2024    8:51 AM 09/03/2023    5:35 AM  CBC  WBC 4.0 - 10.5 K/uL 12.3  13.3  9.3   Hemoglobin 12.0 - 15.0 g/dL 87.2  86.4  86.3   Hematocrit 36.0 - 46.0 % 38.0  39.2  40.1   Platelets 150 - 400 K/uL 195  220  232       Latest Ref Rng & Units 05/31/2024    5:01 AM 05/30/2024    8:51 AM 09/03/2023    5:35 AM  BMP  Glucose 70 - 99  mg/dL 881  891  94   BUN 6 - 20 mg/dL 8  12  7    Creatinine 0.44 - 1.00 mg/dL 9.29  9.23  9.16   Sodium 135 - 145 mmol/L 139  139  135   Potassium 3.5 - 5.1 mmol/L 3.3  3.2  3.7   Chloride 98 - 111 mmol/L 105  104  102   CO2 22 - 32 mmol/L 22  23  25    Calcium 8.9 - 10.3 mg/dL 8.4  8.9  8.5      Family Communication: n/a  Disposition: Status is: Inpatient   Planned Discharge Destination: Home    Time spent: 40 minutes  Author: MDALA-GAUSI, Valerian Jewel AGATHA, MD 05/31/2024 3:15 PM  For on call review [email or web address].

## 2024-06-01 DIAGNOSIS — E876 Hypokalemia: Secondary | ICD-10-CM | POA: Diagnosis not present

## 2024-06-01 DIAGNOSIS — F419 Anxiety disorder, unspecified: Secondary | ICD-10-CM | POA: Diagnosis not present

## 2024-06-01 DIAGNOSIS — K219 Gastro-esophageal reflux disease without esophagitis: Secondary | ICD-10-CM

## 2024-06-01 DIAGNOSIS — K2940 Chronic atrophic gastritis without bleeding: Secondary | ICD-10-CM | POA: Diagnosis not present

## 2024-06-01 DIAGNOSIS — R1013 Epigastric pain: Secondary | ICD-10-CM

## 2024-06-01 DIAGNOSIS — K319 Disease of stomach and duodenum, unspecified: Secondary | ICD-10-CM

## 2024-06-01 DIAGNOSIS — R112 Nausea with vomiting, unspecified: Secondary | ICD-10-CM | POA: Diagnosis not present

## 2024-06-01 DIAGNOSIS — Z8719 Personal history of other diseases of the digestive system: Secondary | ICD-10-CM

## 2024-06-01 LAB — CBC
HCT: 36.7 % (ref 36.0–46.0)
Hemoglobin: 12.7 g/dL (ref 12.0–15.0)
MCH: 32.4 pg (ref 26.0–34.0)
MCHC: 34.6 g/dL (ref 30.0–36.0)
MCV: 93.6 fL (ref 80.0–100.0)
Platelets: 169 K/uL (ref 150–400)
RBC: 3.92 MIL/uL (ref 3.87–5.11)
RDW: 11.3 % — ABNORMAL LOW (ref 11.5–15.5)
WBC: 10.1 K/uL (ref 4.0–10.5)
nRBC: 0 % (ref 0.0–0.2)

## 2024-06-01 LAB — BASIC METABOLIC PANEL WITH GFR
Anion gap: 5 (ref 5–15)
BUN: <5 mg/dL — ABNORMAL LOW (ref 6–20)
CO2: 25 mmol/L (ref 22–32)
Calcium: 8.1 mg/dL — ABNORMAL LOW (ref 8.9–10.3)
Chloride: 104 mmol/L (ref 98–111)
Creatinine, Ser: 0.64 mg/dL (ref 0.44–1.00)
GFR, Estimated: >60 mL/min (ref >60)
Glucose, Bld: 124 mg/dL — ABNORMAL HIGH (ref 70–99)
Potassium: 3.9 mmol/L (ref 3.5–5.1)
Sodium: 134 mmol/L — ABNORMAL LOW (ref 135–145)

## 2024-06-01 LAB — MAGNESIUM: Magnesium: 1.9 mg/dL (ref 1.7–2.4)

## 2024-06-01 MED ORDER — HYDRALAZINE HCL 20 MG/ML IJ SOLN: 10.0000 mg | Freq: Three times a day (TID) | INTRAMUSCULAR | Status: DC | PRN

## 2024-06-01 MED ORDER — AMLODIPINE BESYLATE 5 MG PO TABS
5.0000 mg | ORAL_TABLET | Freq: Every day | ORAL | Status: DC
  Administered 2024-06-01: 5 mg via ORAL
  Filled 2024-06-01: qty 1

## 2024-06-01 MED ORDER — NORTRIPTYLINE HCL 10 MG PO CAPS
10.0000 mg | ORAL_CAPSULE | Freq: Every day | ORAL | Status: DC
  Filled 2024-06-01: qty 1

## 2024-06-01 MED ORDER — SUCRALFATE 1 G PO TABS: 1.0000 g | ORAL_TABLET | Freq: Three times a day (TID) | ORAL | 0 refills | Status: DC

## 2024-06-01 NOTE — Discharge Summary (Signed)
 Physician Discharge Summary   Patient: [name] MRN: [id number] DOB: 17-Aug-1981  Admit date:     05/30/2024  Discharge date: {dischdate:26783}  Discharge Physician: Concepcion Riser   PCP: [facility]   Recommendations at discharge:  {Tip this will not be part of the note when signed- Example include specific recommendations for outpatient follow-up, pending tests to follow-up on. (Optional):26781}  ***  Discharge Diagnoses: Principal Problem:   Intractable nausea and vomiting Active Problems:   Chronic erosive gastritis   Chronic pain   Chronic anxiety   Hypertension   Hypokalemia   Leukocytosis  Resolved Problems:   * No resolved hospital problems. *  Hospital Course: 43 year old female with history of anxiety, hypertension, erosive gastritis, GERD, migraine headaches, intermittent episodes of intractable nausea and vomiting presented to the emergency department complaining of severe vomiting for past 24 hours and left-sided chest pain radiating down left arm.  Patient reports having acid reflux symptoms that radiate up into the mouth and back of throat similar to when she was admitted last year and EGD showed severe erosive gastritis at that time.  She has been taking Protonix  but has completed her course of Carafate .  She reports that she has not been able to keep down anything including fluids.  She denies seeing blood in the emesis.  She was given some supportive treatments in the ED and symptoms persisted.  She has some hypokalemia noted and clinically appeared dehydrated.  High-sensitivity troponin tests normal.  WBC elevated at 13.3.  CT performed of the abdomen and pelvis with contrast was reassuring of no acute findings.  Urinalysis did not show infection.  Admission was requested for further management.  Assessment and Plan: No notes have been filed under this hospital service. Service: Hospitalist     {Tip this will not be part of the  note when signed Body mass index is 25.46 kg/m. , ,  (Optional):26781}  {(NOTE) Pain control PDMP Statment (Optional):26782} Consultants: *** Procedures performed: ***  Disposition: {Plan; Disposition:26390} Diet recommendation:  Discharge Diet Orders (From admission, onward)     Start     Ordered   06/01/24 0000  Diet - low sodium heart healthy        06/01/24 1743           {Diet_Plan:26776} DISCHARGE MEDICATION: Allergies as of 06/01/2024       Reactions   Demerol Nausea Only   Orange Fruit [citrus]    Fever blisters        Medication List     STOP taking these medications    HYDROcodone -acetaminophen  10-325 MG tablet Commonly known as: NORCO       TAKE these medications    gabapentin  600 MG tablet Commonly known as: NEURONTIN  Take 600 mg by mouth 4 (four) times daily.   Multivitamin Adult Chew Chew 2 each by mouth daily.   Nurtec 75 MG Tbdp Generic drug: Rimegepant Sulfate  Take 1 tablet by mouth every other day.   ondansetron  8 MG disintegrating tablet Commonly known as: ZOFRAN -ODT Take 1 tablet (8 mg total) by mouth every 8 (eight) hours as needed.   pantoprazole  40 MG tablet Commonly known as: PROTONIX  Take 1 tablet (40 mg total) by mouth 2 (two) times daily.   promethazine  25 MG suppository Commonly known as: PHENERGAN  Place 1 suppository (25 mg total) rectally every 6 (six) hours as needed for refractory nausea / vomiting.   sucralfate  1 g tablet Commonly known as: Carafate  Take 1 tablet (1  g total) by mouth in the morning, at noon, and at bedtime for 10 days.   trazodone  300 MG tablet Commonly known as: DESYREL  Take 300 mg by mouth daily.        Discharge Exam: Filed Weights   05/30/24 0839 05/30/24 1325  Weight: 54.9 kg 65.2 kg   ***  Condition at discharge: {DC Condition:26389}  The results of significant diagnostics from this hospitalization (including imaging, microbiology, ancillary and laboratory) are listed below  for reference.   Imaging Studies: CT ABDOMEN PELVIS W CONTRAST Result Date: 05/30/2024 CLINICAL DATA:  Abdominal pain, acute, nonlocalized Vomiting for 24 hours with more recent onset of left-sided chest pain radiating into the left arm. EXAM: CT ABDOMEN AND PELVIS WITH CONTRAST TECHNIQUE: Multidetector CT imaging of the abdomen and pelvis was performed using the standard protocol following bolus administration of intravenous contrast. RADIATION DOSE REDUCTION: This exam was performed according to the departmental dose-optimization program which includes automated exposure control, adjustment of the mA and/or kV according to patient size and/or use of iterative reconstruction technique. CONTRAST:  OMNIPAQUE  IOHEXOL  300 MG/ML  SOLN COMPARISON:  Abdominopelvic CT 08/30/2023. FINDINGS: Lower chest: Stable small calcified granuloma medially in the right lower lobe. The lung bases are otherwise clear. No significant pleural or pericardial effusion. Hepatobiliary: The liver is normal in density without suspicious focal abnormality. No significant biliary dilatation status post cholecystectomy. Pancreas: Unremarkable. No pancreatic ductal dilatation or surrounding inflammatory changes. Spleen: Normal in size without focal abnormality. Adrenals/Urinary Tract: Both adrenal glands appear normal. No evidence of urinary tract calculus, suspicious renal lesion or hydronephrosis. The bladder appears unremarkable for its degree of distention. Stomach/Bowel: No enteric contrast administered. The stomach appears unremarkable for its degree of distension. No evidence of bowel wall thickening, distention or surrounding inflammatory change. The appendix appears normal. Vascular/Lymphatic: There are no enlarged abdominal or pelvic lymph nodes. Mild aortic atherosclerosis without evidence of aneurysm or large vessel occlusion. Reproductive: The uterus and ovaries appear unchanged. There are no suspicious adnexal findings. Stable  small left ovarian vein varices. Other: No evidence of abdominal wall mass or hernia. No ascites or pneumoperitoneum. Musculoskeletal: No acute or significant osseous findings. Mild facet arthropathy in the lower lumbar spine. Previous left femoral ORIF. IMPRESSION: 1. No acute findings or explanation for the patient's symptoms. 2. Previous cholecystectomy without biliary dilatation. 3. Stable small left ovarian vein varices. 4.  Aortic Atherosclerosis (ICD10-I70.0). Electronically Signed   By: Elsie Perone M.D.   On: 05/30/2024 11:22   DG Chest Port 1 View Result Date: 05/30/2024 CLINICAL DATA:  pain EXAM: PORTABLE CHEST - 1 VIEW COMPARISON:  September 02, 2023 FINDINGS: No focal airspace consolidation, pleural effusion, or pneumothorax. No cardiomegaly. No acute fracture or destructive lesion. IMPRESSION: No acute cardiopulmonary abnormality. Electronically Signed   By: Rogelia Myers M.D.   On: 05/30/2024 09:52    Microbiology: Results for orders placed or performed in visit on 12/16/17  GC/Chlamydia Probe Amp     Status: None   Collection Time: 12/16/17  4:30 PM   [facility]  Result Value Ref Range Status   Chlamydia trachomatis, NAA Negative Negative Final   Neisseria gonorrhoeae by PCR Negative Negative Final    Labs: CBC: Recent Labs  Lab 05/30/24 0851 05/31/24 0501 06/01/24 0454  WBC 13.3* 12.3* 10.1  NEUTROABS 10.2* 10.4*  --   HGB 13.5 12.7 12.7  HCT 39.2 38.0 36.7  MCV 94.0 95.5 93.6  PLT 220 195 169   Basic Metabolic Panel: Recent  Labs  Lab 05/30/24 0851 05/30/24 1024 05/31/24 0501 06/01/24 0454  NA 139  --  139 134*  K 3.2*  --  3.3* 3.9  CL 104  --  105 104  CO2 23  --  22 25  GLUCOSE 108*  --  118* 124*  BUN 12  --  8 <5*  CREATININE 0.76  --  0.70 0.64  CALCIUM 8.9  --  8.4* 8.1*  MG  --  1.9 1.6* 1.9   Liver Function Tests: Recent Labs  Lab 05/30/24 0851  AST 21  ALT 17  ALKPHOS 33*  BILITOT 0.8  PROT 7.0  ALBUMIN 4.1   CBG: No results for  input(s): GLUCAP in the last 168 hours.  Discharge time spent: {LESS THAN/GREATER UYJW:73611} 30 minutes.  Signed: Concepcion Riser, MD [facility] 06/01/2024

## 2024-06-01 NOTE — Progress Notes (Signed)
 Progress Note   Patient: [name] FMW:[id number] DOB: 11-09-1980 DOA: 05/30/2024     2 DOS: the patient was seen and examined on 06/01/2024   Brief hospital course: 43 year old female with history of anxiety, hypertension, erosive gastritis, GERD, migraine headaches, intermittent episodes of intractable nausea and vomiting presented to the emergency department complaining of severe vomiting for past 24 hours and left-sided chest pain radiating down left arm. CT abdomen performed of the abdomen and pelvis with contrast was reassuring of no acute findings.  Admitted to [facility] service for evaluation of intractable nausea and vomiting.  Assessment and Plan: Intractable nausea and vomiting suspect recurrent bout of acute on chronic erosive gastritis Continue gentle IV fluids, antiemetics. Discussed wit hGI, who will see her today. continue IV pantoprazole , sucralfate . Ice chips and clears for now.   Chronic erosive gastritis IV pantoprazole  40 mg BID,  restarted carafate   GI evaluation called.  Hypokalemia Due to GI losses. K improved with supplements Monitor daily electrolytes.   Hypomagnesemia Mag improved.   Chronic anxiety disorder PRN anxiolytics ordered   Leukocytosis Reactive, improved.    Chronic Pain Pain meds ordered as needed    Elevated blood pressure Will start Norvasc  5mg  daily. IV hydralazine  ordered PRN.     Out of bed to chair. Incentive spirometry. Nursing supportive care. Fall, aspiration precautions. Diet:  Diet Orders (From admission, onward)     Start     Ordered   05/30/24 1207  Diet clear liquid Room service appropriate? Yes; Fluid consistency: Thin  Diet effective now       Question Answer Comment  Room service appropriate? Yes   Fluid consistency: Thin      05/30/24 1211           DVT prophylaxis: enoxaparin  (LOVENOX ) injection 40 mg Start: 05/31/24 2200  Level of care: Med-Surg   Code Status: Full Code  Subjective: Patient  is seen and examined today morning. She is lying in bed. Has severe nausea, unable to tolerate liquids, asks for GI evaluation.   Physical Exam: Vitals:   05/31/24 1349 05/31/24 1952 06/01/24 0422 06/01/24 1317  BP: (!) 153/89 (!) 143/83 116/77 (!) 140/87  Pulse: 68 70 62 65  Resp: 17 17 17 18   Temp: 99.2 F (37.3 C) 98.7 F (37.1 C) 98.3 F (36.8 C) 98.8 F (37.1 C)  TempSrc: Oral Oral Oral Oral  SpO2: 99% 97% 97% 100%  Weight:      Height:        General - Young  Caucasian anxious female, no apparent distress HEENT - PERRLA, EOMI, atraumatic head, non tender sinuses. Lung - Clear, no rales, rhonchi, wheezes. Heart - S1, S2 heard, no murmurs, rubs, no pedal edema. Abdomen - Soft, non tender, bowel sounds good Neuro - Alert, awake and oriented x 3, non focal exam. Skin - Warm and dry.  Data Reviewed:      Latest Ref Rng & Units 06/01/2024    4:54 AM 05/31/2024    5:01 AM 05/30/2024    8:51 AM  CBC  WBC 4.0 - 10.5 K/uL 10.1  12.3  13.3   Hemoglobin 12.0 - 15.0 g/dL 87.2  87.2  86.4   Hematocrit 36.0 - 46.0 % 36.7  38.0  39.2   Platelets 150 - 400 K/uL 169  195  220       Latest Ref Rng & Units 06/01/2024    4:54 AM 05/31/2024    5:01 AM 05/30/2024    8:51 AM  BMP  Glucose 70 - 99 mg/dL 875  881  891   BUN 6 - 20 mg/dL 5  8  12    Creatinine 0.44 - 1.00 mg/dL 9.35  9.29  9.23   Sodium 135 - 145 mmol/L 134  139  139   Potassium 3.5 - 5.1 mmol/L 3.9  3.3  3.2   Chloride 98 - 111 mmol/L 104  105  104   CO2 22 - 32 mmol/L 25  22  23    Calcium 8.9 - 10.3 mg/dL 8.1  8.4  8.9    No results found.  Family Communication: Discussed with patient, understand and agree. All questions answered.  Disposition: Status is: Inpatient Remains inpatient appropriate because: intractable nausea, vomiting.  Planned Discharge Destination: Home     Time spent: 44 minutes  Author: Concepcion Riser, MD 06/01/2024 2:06 PM Secure chat 7am to 7pm For on call review  [email or web address].

## 2024-06-01 NOTE — Plan of Care (Signed)

## 2024-06-01 NOTE — Consult Note (Signed)
 Gastroenterology Consult   Referring Provider: No ref. provider found Primary Care Physician:  [facility] Primary Gastroenterologist:  Toribio Rubins, MD  Patient ID: [name]; [id number]; 05-04-1981   Admit date: 05/30/2024  LOS: 2 days   Date of Consultation: 06/01/2024  Reason for Consultation:  intractable nausea/vomiting.   History of Present Illness   [name] is a 43 y.o. 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  Given intractable nausea and vomiting GI was consulted for further evaluation.  Hospital Course: Received potassium supplements. Had been tolerating ice chips.  Has been receiving IV hydralazine  as needed and starting Norvasc  5 mg daily.  Leukocytosis resolved.  Has been receiving as needed anxiolytics.   Consult:  Discussed the use of AI scribe software for clinical note transcription with the patient, who gave verbal consent to proceed.  She has been experiencing severe nausea and vomiting since waking up this morning. Initially, she felt some relief after consuming chicken broth and a Propel drink, but symptoms worsened after taking medication and sleeping for six hours. Upon waking, they experienced intense abdominal pain and began vomiting again. The abdominal pain, described as severe and 'real tense,' started before the nausea and vomiting. A bowel movement provided some relief from the symptoms this afternoon.  She has a history of similar episodes and underwent a scope in November, which showed erosive gastropathy.  She has been taking pantoprazole  (Protonix ) twice daily for this condition. The patient reports significant stress due to recent family losses and personal issues, and notes that stress has previously exacerbated her  condition, leading to similar episodes.  She is currently taking Zofran  for nausea and have Phenergan  available if needed.  She mentions issues with insurance coverage for her migraine medication, Nurtec, and are currently on an unspecified alternative.  In terms of social history, she occasionally uses nicotine vapes.  She also mentioned a recent use of CBD gummies during a stressful family event, which she does not typically use. No constipation prior to the hospital visit, but some bloating was reported. No blood in recent bowel movements and no recent use of ibuprofen , Aleve, Advil , or BC powders.  Had admission in November 2024 for similar symptoms.  Ultimately underwent upper endoscopy.  Cardiac workup negative.  Was continued on home anxiolytic medications.  Had CT head without any acute intracranial abnormalities.  She was treated with Bentyl , Carafate , and PPI twice daily.  She was advised to follow-up outpatient.  EGD November 2024 by Dr. Eartha: -Normal esophagus.  - Erosive gastropathy with stigmata of recent bleeding. Biopsied.  - Normal examined duodenum. - Advised CTA abdomen and pelvis with contrast if persistent abdominal pain.  Past Medical History:  Diagnosis Date   Acid reflux    Anxiety    Arthritis    Environmental allergies    Hypertension    Migraine     Past Surgical History:  Procedure Laterality Date   arm surgery Left    plate in arm   BILATERAL SALPINGECTOMY     BIOPSY  09/03/2023   Procedure: BIOPSY;  Surgeon: Eartha Angelia Toribio, MD;  Location: [facility];  Service: Gastroenterology;;   BONE BIOPSY Right    thumb   BRAIN SURGERY     evacuation of hematoma   CESAREAN SECTION  x2   CHOLECYSTECTOMY     ECTOPIC PREGNANCY SURGERY     x2   ENDOMETRIAL ABLATION  01/18/2018   Procedure: ENDOMETRIAL ABLATION WITH MINERVA;  Surgeon: Edsel Norleen GAILS, MD;  Location: [facility];  Service: Gynecology;;   ESOPHAGOGASTRODUODENOSCOPY (EGD) WITH  PROPOFOL  N/A 01/30/2014   Procedure: ESOPHAGOGASTRODUODENOSCOPY (EGD) WITH PROPOFOL ;  Surgeon: Margo LITTIE Haddock, MD;  Location: [facility];  Service: Endoscopy;  Laterality: N/A;   ESOPHAGOGASTRODUODENOSCOPY (EGD) WITH PROPOFOL  N/A 09/03/2023   Procedure: ESOPHAGOGASTRODUODENOSCOPY (EGD) WITH PROPOFOL ;  Surgeon: Eartha Angelia Sieving, MD;  Location: [facility];  Service: Gastroenterology;  Laterality: N/A;   HYSTEROSCOPY WITH D & C N/A 08/13/2015   Procedure: DILATATION AND CURETTAGE /HYSTEROSCOPY;  Surgeon: Norleen Edsel GAILS, MD;  Location: [facility];  Service: Gynecology;  Laterality: N/A;   HYSTEROSCOPY WITH D & C  01/18/2018   Procedure: CERVICAL DILATATION /HYSTEROSCOPY;  Surgeon: Edsel Norleen GAILS, MD;  Location: [facility];  Service: Gynecology;;   LEG SURGERY Left    femur rodding and removal of part of left femur   otif ankle Right     Prior to Admission medications   Medication Sig Start Date End Date Taking? Authorizing Provider  gabapentin  (NEURONTIN ) 600 MG tablet Take 600 mg by mouth 4 (four) times daily. 04/17/24  Yes [provider]  HYDROcodone -acetaminophen  (NORCO) 10-325 MG tablet Take 1-2 tablets by mouth every 6 (six) hours as needed for moderate pain (pain score 4-6). 09/04/23  Yes Ricky Fines, MD  Multiple Vitamins-Minerals (MULTIVITAMIN ADULT) CHEW Chew 2 each by mouth daily.   Yes [provider]  ondansetron  (ZOFRAN -ODT) 8 MG disintegrating tablet Take 1 tablet (8 mg total) by mouth every 8 (eight) hours as needed. 09/04/23  Yes Ricky Fines, MD  pantoprazole  (PROTONIX ) 40 MG tablet Take 1 tablet (40 mg total) by mouth 2 (two) times daily. 09/04/23  Yes Ricky Fines, MD  promethazine  (PHENERGAN ) 25 MG suppository Place 1 suppository (25 mg total) rectally every 6 (six) hours as needed for refractory nausea / vomiting. 09/04/23  Yes Ricky Fines, MD  trazodone  (DESYREL ) 300 MG tablet Take 300 mg by mouth daily. 08/10/23  Yes [provider]  NURTEC 75  MG TBDP Take 1 tablet by mouth every other day. Patient not taking: Reported on 09/02/2023 08/26/23   [provider]  sucralfate  (CARAFATE ) 1 g tablet Take 1 tablet (1 g total) by mouth in the morning, at noon, and at bedtime for 10 days. Patient not taking: Reported on 05/30/2024 09/04/23 09/14/23  Ricky Fines, MD    Current Facility-Administered Medications  Medication Dose Route Frequency Provider Last Rate Last Admin   acetaminophen  (TYLENOL ) tablet 650 mg  650 mg Oral Q6H PRN Johnson, Clanford L, MD       Or   acetaminophen  (TYLENOL ) suppository 650 mg  650 mg Rectal Q6H PRN Johnson, Clanford L, MD       bisacodyl  (DULCOLAX) EC tablet 5 mg  5 mg Oral Daily PRN Johnson, Clanford L, MD       dextrose  5 % and 0.45 % NaCl with KCl 40 mEq/L infusion   Intravenous Continuous Mdala-Gausi, Masiku Agatha, MD 100 mL/hr at 06/01/24 1245 New Bag at 06/01/24 1245   enoxaparin  (LOVENOX ) injection 40 mg  40 mg Subcutaneous Q24H Mdala-Gausi, Masiku Agatha, MD   40 mg at 05/31/24 2158   gabapentin  (NEURONTIN ) capsule 600 mg  600 mg Oral QID Vicci, Clanford L, MD   600 mg at 05/31/24 2151  hydrALAZINE  (APRESOLINE ) injection 10 mg  10 mg Intravenous Q4H PRN Johnson, Clanford L, MD       HYDROmorphone  (DILAUDID ) injection 0.3 mg  0.3 mg Intravenous Q3H PRN Mdala-Gausi, Golden Pillow, MD   0.3 mg at 06/01/24 1322   LORazepam  (ATIVAN ) injection 0.25 mg  0.25 mg Intravenous Q4H PRN Vicci, Clanford L, MD   0.25 mg at 06/01/24 0954   ondansetron  (ZOFRAN ) tablet 4 mg  4 mg Oral Q6H Johnson, Clanford L, MD       Or   ondansetron  (ZOFRAN ) injection 4 mg  4 mg Intravenous Q6H Johnson, Clanford L, MD   4 mg at 06/01/24 1322   oxyCODONE  (Oxy IR/ROXICODONE ) immediate release tablet 5 mg  5 mg Oral Q4H PRN Vicci, Clanford L, MD   5 mg at 05/30/24 2309   pantoprazole  (PROTONIX ) injection 40 mg  40 mg Intravenous Q12H Johnson, Clanford L, MD   40 mg at 06/01/24 9041   prochlorperazine  (COMPAZINE ) injection  10 mg  10 mg Intravenous Q4H PRN Johnson, Clanford L, MD   10 mg at 06/01/24 9072   sucralfate  (CARAFATE ) tablet 1 g  1 g Oral TID WC & HS Johnson, Clanford L, MD   1 g at 05/30/24 1707   traZODone  (DESYREL ) tablet 150 mg  150 mg Oral QHS Johnson, Clanford L, MD   150 mg at 05/31/24 2153    Allergies as of 05/30/2024 - Review Complete 05/30/2024  Allergen Reaction Noted   Demerol Nausea Only 01/14/2011   Orange fruit [citrus]  01/26/2014    Family History  Problem Relation Age of Onset   Mental illness Mother    Diabetes Mother    Obesity Mother    Heart disease Father    Obesity Sister    Lupus Paternal Aunt    Cancer Maternal Grandfather        GASTRIC   Lupus Paternal Grandmother    Diabetes Son        boarderline   Arthritis Other    Cancer Other    Diabetes Other     Social History   Socioeconomic History   Marital status: Married    Spouse name: Not on file   Number of children: Not on file   Years of education: Not on file   Highest education level: Not on file  Occupational History   Not on file  Tobacco Use   Smoking status: Former    Current packs/day: 0.00    Average packs/day: 1 pack/day for 20.0 years (20.0 ttl pk-yrs)    Types: Cigarettes    Start date: 12/02/1991    Quit date: 12/02/2011    Years since quitting: 12.5   Smokeless tobacco: Never  Vaping Use   Vaping status: Every Day  Substance and Sexual Activity   Alcohol use: No   Drug use: No   Sexual activity: Yes    Birth control/protection: Surgical  Other Topics Concern   Not on file  Social History Narrative   Not on file   Social Drivers of Health   Financial Resource Strain: Low Risk  (08/04/2023)   Overall Financial Resource Strain (CARDIA)    Difficulty of Paying Living Expenses: Not very hard  Food Insecurity: No Food Insecurity (05/30/2024)   Hunger Vital Sign    Worried About Running Out of Food in the Last Year: Never true    Ran Out of Food in the Last Year: Never true   Transportation Needs: No Transportation Needs (05/30/2024)  PRAPARE - Administrator, Civil Service (Medical): No    Lack of Transportation (Non-Medical): No  Physical Activity: Insufficiently Active (08/04/2023)   Exercise Vital Sign    Days of Exercise per Week: 3 days    Minutes of Exercise per Session: 30 min  Stress: No Stress Concern Present (08/04/2023)   [employer] of Occupational Health - Occupational Stress Questionnaire    Feeling of Stress : Only a little  Social Connections: Moderately Integrated (08/04/2023)   Social Connection and Isolation Panel    Frequency of Communication with Friends and Family: Three times a week    Frequency of Social Gatherings with Friends and Family: Twice a week    Attends Religious Services: 1 to 4 times per year    Active Member of [employer] or Organizations: No    Attends Banker Meetings: Never    Marital Status: Married  Catering manager Violence: Not At Risk (05/30/2024)   Humiliation, Afraid, Rape, and Kick questionnaire    Fear of Current or Ex-Partner: No    Emotionally Abused: No    Physically Abused: No    Sexually Abused: No     Review of Systems   Gen: Denies any fever, chills, loss of appetite, change in weight or weight loss CV: Denies chest pain, heart palpitations, syncope, edema  Resp: Denies shortness of breath with rest, cough, wheezing, coughing up blood, and pleurisy. GI: See HPI Derm: Denies rash, itching, dry skin, hives. Psych: + Anxiety/depression, PTSD.  Denies memory loss, hallucinations, and confusion. Neuro:  Denies any headaches, dizziness, paresthesias, shaking  Physical Exam   Vital Signs in last 24 hours: Temp:  [98.3 F (36.8 C)-99.2 F (37.3 C)] 98.8 F (37.1 C) (08/07 1317) Pulse Rate:  [62-70] 65 (08/07 1317) Resp:  [17-18] 18 (08/07 1317) BP: (116-153)/(77-89) 140/87 (08/07 1317) SpO2:  [97 %-100 %] 100 % (08/07 1317) Last BM Date : 05/29/24  General:   Alert,   pleasant and cooperative in NAD Head:  Normocephalic and atraumatic. Eyes:  Sclera clear, no icterus.   Conjunctiva pink. Ears:  Normal auditory acuity. Mouth:  No deformity or lesions, dentition normal. Abdomen:  Soft, nontender and nondistended. No masses, hepatosplenomegaly or hernias noted. Normal bowel sounds, without guarding, and without rebound.   Msk:  Symmetrical without gross deformities. Normal posture. Neurologic:  Alert and  oriented x4. Psych:  Alert and cooperative. Normal mood and affect.  Intake/Output from previous day: 08/06 0701 - 08/07 0700 In: 120 [P.O.:120] Out: -  Intake/Output this shift: Total I/O In: 180 [P.O.:180] Out: -   @WEIGHTS @  Labs/Studies   Recent Labs Recent Labs    05/30/24 0851 05/31/24 0501 06/01/24 0454  WBC 13.3* 12.3* 10.1  HGB 13.5 12.7 12.7  HCT 39.2 38.0 36.7  PLT 220 195 169   BMET Recent Labs    05/30/24 0851 05/31/24 0501 06/01/24 0454  NA 139 139 134*  K 3.2* 3.3* 3.9  CL 104 105 104  CO2 23 22 25   GLUCOSE 108* 118* 124*  BUN 12 8 <5*  CREATININE 0.76 0.70 0.64  CALCIUM 8.9 8.4* 8.1*   LFT Recent Labs    05/30/24 0851  PROT 7.0  ALBUMIN 4.1  AST 21  ALT 17  ALKPHOS 33*  BILITOT 0.8   PT/INR No results for input(s): LABPROT, INR in the last 72 hours. Hepatitis Panel No results for input(s): HEPBSAG, HCVAB, HEPAIGM, HEPBIGM in the last 72 hours. C-Diff No results for input(s):  CDIFFTOX in the last 72 hours.  Radiology/Studies No results found.   Assessment   [name] is a 43 y.o. year old female    Intractable nausea/vomiting, possible cyclical vomiting syndrome Possible cyclical vomiting syndrome with recent exacerbation likely triggered by stress and potential medication interactions.  Has significant history of anxiety and depression and has tried other anxiolytics in the past such as BuSpar .  Similar episodes have occurred in the past, with recent stressors  contributing to symptoms. Consideration of stress-induced exacerbation and potential benefit from medication to stabilize symptoms. Discussion of potential benefits of TCA for managing cyclical vomiting syndrome and stabilizing symptoms. Potential side effects include constipation, difficulty urinating, and dry mouth at higher doses, blurry vision discussed with patient.  Vaping could be causing worsening symptoms. - Initiate nortriptyline  or amitriptyline at 10-20 mg at nighttime to help manage cyclical vomiting syndrome and stabilize symptoms. - Continue Zofran  for nausea management. - Use Phenergan  if unable to keep anything down. - Monitor for constipation as a side effect of new medication and consider stool softeners or Miralax if needed. - Try bland and soft foods for dinner to assess tolerance before discharge.  Erosive gastropathy with history of bleeding, epigastric pain Gastropathy noted on previous scope in November. Current symptoms may be exacerbated by stress and medication interactions.  Vaping could also be contributing.  Continued management with pantoprazole  (Protonix ) twice daily. - Continue pantoprazole  (Protonix ) 40 mg twice daily. - Consider future adjustment of proton pump inhibitor to different agent if current regimen becomes less effective. - Cessation of vaping - Avoidance of CBD or THC containing products.  Plan / Recommendations   Start nortriptyline  10 mg nightly Continue Zofran  as needed May use Phenergan  as needed for abortive nausea/vomiting refractory to Zofran . May need to start MiraLAX as needed to ensure constipation not exacerbating symptoms Advance to soft diet today Cessation of vaping.   If tolerating diet this afternoon and tomorrow morning could be discharged home with outpatient follow-up.   06/01/2024, 1:49 PM  Charmaine Melia, MSN, FNP-BC, AGACNP-BC [facility]

## 2024-06-01 NOTE — Plan of Care (Signed)
  Problem: Coping: Goal: Level of anxiety will decrease Outcome: Progressing   Problem: Pain Managment: Goal: General experience of comfort will improve and/or be controlled Outcome: Progressing   Problem: Skin Integrity: Goal: Risk for impaired skin integrity will decrease Outcome: Progressing

## 2024-06-01 NOTE — Progress Notes (Signed)
 Pt was able to sleep without incident for five hours. Once awake, nausea and vomiting continued. Resumed Dilaudid  and Zofran  per MD orders. Pt also requested Lorazepam  for anxiety.

## 2024-06-02 ENCOUNTER — Emergency Department (HOSPITAL_COMMUNITY)
Admission: EM | Admit: 2024-06-02 | Discharge: 2024-06-02 | Disposition: A | Discharge status: Home / Self Care | Source: Home / Self Care | Attending: Emergency Medicine | Admitting: Emergency Medicine

## 2024-06-02 ENCOUNTER — Encounter (HOSPITAL_COMMUNITY): Payer: Self-pay

## 2024-06-02 ENCOUNTER — Other Ambulatory Visit: Payer: Self-pay

## 2024-06-02 DIAGNOSIS — R1110 Vomiting, unspecified: Secondary | ICD-10-CM | POA: Diagnosis not present

## 2024-06-02 DIAGNOSIS — K219 Gastro-esophageal reflux disease without esophagitis: Secondary | ICD-10-CM | POA: Diagnosis not present

## 2024-06-02 DIAGNOSIS — E871 Hypo-osmolality and hyponatremia: Secondary | ICD-10-CM | POA: Diagnosis not present

## 2024-06-02 DIAGNOSIS — I10 Essential (primary) hypertension: Secondary | ICD-10-CM | POA: Diagnosis not present

## 2024-06-02 DIAGNOSIS — F1120 Opioid dependence, uncomplicated: Secondary | ICD-10-CM | POA: Diagnosis not present

## 2024-06-02 DIAGNOSIS — Z833 Family history of diabetes mellitus: Secondary | ICD-10-CM | POA: Diagnosis not present

## 2024-06-02 DIAGNOSIS — E0590 Thyrotoxicosis, unspecified without thyrotoxic crisis or storm: Secondary | ICD-10-CM | POA: Diagnosis not present

## 2024-06-02 DIAGNOSIS — Z9079 Acquired absence of other genital organ(s): Secondary | ICD-10-CM | POA: Diagnosis not present

## 2024-06-02 DIAGNOSIS — Z818 Family history of other mental and behavioral disorders: Secondary | ICD-10-CM | POA: Diagnosis not present

## 2024-06-02 DIAGNOSIS — Z885 Allergy status to narcotic agent status: Secondary | ICD-10-CM | POA: Diagnosis not present

## 2024-06-02 DIAGNOSIS — R1010 Upper abdominal pain, unspecified: Secondary | ICD-10-CM | POA: Diagnosis not present

## 2024-06-02 DIAGNOSIS — K2900 Acute gastritis without bleeding: Secondary | ICD-10-CM | POA: Diagnosis not present

## 2024-06-02 DIAGNOSIS — F1210 Cannabis abuse, uncomplicated: Secondary | ICD-10-CM | POA: Diagnosis not present

## 2024-06-02 DIAGNOSIS — R112 Nausea with vomiting, unspecified: Secondary | ICD-10-CM | POA: Diagnosis not present

## 2024-06-02 DIAGNOSIS — R109 Unspecified abdominal pain: Secondary | ICD-10-CM | POA: Diagnosis not present

## 2024-06-02 DIAGNOSIS — Z9049 Acquired absence of other specified parts of digestive tract: Secondary | ICD-10-CM | POA: Diagnosis not present

## 2024-06-02 DIAGNOSIS — Z8249 Family history of ischemic heart disease and other diseases of the circulatory system: Secondary | ICD-10-CM | POA: Diagnosis not present

## 2024-06-02 DIAGNOSIS — F12188 Cannabis abuse with other cannabis-induced disorder: Secondary | ICD-10-CM | POA: Diagnosis not present

## 2024-06-02 DIAGNOSIS — F39 Unspecified mood [affective] disorder: Secondary | ICD-10-CM | POA: Diagnosis not present

## 2024-06-02 DIAGNOSIS — R079 Chest pain, unspecified: Secondary | ICD-10-CM | POA: Diagnosis not present

## 2024-06-02 DIAGNOSIS — N83202 Unspecified ovarian cyst, left side: Secondary | ICD-10-CM | POA: Diagnosis present

## 2024-06-02 DIAGNOSIS — R1013 Epigastric pain: Secondary | ICD-10-CM | POA: Diagnosis not present

## 2024-06-02 DIAGNOSIS — Z79899 Other long term (current) drug therapy: Secondary | ICD-10-CM | POA: Diagnosis not present

## 2024-06-02 DIAGNOSIS — F17290 Nicotine dependence, other tobacco product, uncomplicated: Secondary | ICD-10-CM | POA: Diagnosis present

## 2024-06-02 DIAGNOSIS — R1115 Cyclical vomiting syndrome unrelated to migraine: Secondary | ICD-10-CM | POA: Diagnosis not present

## 2024-06-02 DIAGNOSIS — M549 Dorsalgia, unspecified: Secondary | ICD-10-CM | POA: Diagnosis present

## 2024-06-02 DIAGNOSIS — G8929 Other chronic pain: Secondary | ICD-10-CM | POA: Diagnosis not present

## 2024-06-02 DIAGNOSIS — Z716 Tobacco abuse counseling: Secondary | ICD-10-CM | POA: Diagnosis not present

## 2024-06-02 DIAGNOSIS — R0789 Other chest pain: Secondary | ICD-10-CM | POA: Diagnosis not present

## 2024-06-02 DIAGNOSIS — Z91018 Allergy to other foods: Secondary | ICD-10-CM | POA: Diagnosis not present

## 2024-06-02 DIAGNOSIS — E860 Dehydration: Secondary | ICD-10-CM | POA: Diagnosis not present

## 2024-06-02 LAB — LIPASE, BLOOD: Lipase: 27 U/L (ref 11–51)

## 2024-06-02 LAB — COMPREHENSIVE METABOLIC PANEL WITH GFR
ALT: 44 U/L (ref 0–44)
AST: 27 U/L (ref 15–41)
Albumin: 3.9 g/dL (ref 3.5–5.0)
Alkaline Phosphatase: 34 U/L — ABNORMAL LOW (ref 38–126)
Anion gap: 15 (ref 5–15)
BUN: 5 mg/dL — ABNORMAL LOW (ref 6–20)
CO2: 23 mmol/L (ref 22–32)
Calcium: 8.7 mg/dL — ABNORMAL LOW (ref 8.9–10.3)
Chloride: 98 mmol/L (ref 98–111)
Creatinine, Ser: 0.71 mg/dL (ref 0.44–1.00)
GFR, Estimated: >60 mL/min (ref >60)
Glucose, Bld: 113 mg/dL — ABNORMAL HIGH (ref 70–99)
Potassium: 3.6 mmol/L (ref 3.5–5.1)
Sodium: 136 mmol/L (ref 135–145)
Total Bilirubin: 1.1 mg/dL (ref 0.0–1.2)
Total Protein: 6.8 g/dL (ref 6.5–8.1)

## 2024-06-02 LAB — MAGNESIUM: Magnesium: 1.8 mg/dL (ref 1.7–2.4)

## 2024-06-02 LAB — CBC WITH DIFFERENTIAL/PLATELET
Abs Immature Granulocytes: 0.03 K/uL (ref 0.00–0.07)
Basophils Absolute: 0 K/uL (ref 0.0–0.1)
Basophils Relative: 0 %
Eosinophils Absolute: 0 K/uL (ref 0.0–0.5)
Eosinophils Relative: 0 %
HCT: 41 % (ref 36.0–46.0)
Hemoglobin: 14.5 g/dL (ref 12.0–15.0)
Immature Granulocytes: 0 %
Lymphocytes Relative: 12 %
Lymphs Abs: 1.2 K/uL (ref 0.7–4.0)
MCH: 32.7 pg (ref 26.0–34.0)
MCHC: 35.4 g/dL (ref 30.0–36.0)
MCV: 92.3 fL (ref 80.0–100.0)
Monocytes Absolute: 0.8 K/uL (ref 0.1–1.0)
Monocytes Relative: 9 %
Neutro Abs: 7.4 K/uL (ref 1.7–7.7)
Neutrophils Relative %: 79 %
Platelets: 189 K/uL (ref 150–400)
RBC: 4.44 MIL/uL (ref 3.87–5.11)
RDW: 11.2 % — ABNORMAL LOW (ref 11.5–15.5)
WBC: 9.5 K/uL (ref 4.0–10.5)
nRBC: 0 % (ref 0.0–0.2)

## 2024-06-02 LAB — TROPONIN I (HIGH SENSITIVITY)
Troponin I (High Sensitivity): 4 ng/L (ref <18)
Troponin I (High Sensitivity): 4 ng/L (ref <18)

## 2024-06-02 MED ORDER — SODIUM CHLORIDE 0.9 % IV BOLUS
1000.0000 mL | Freq: Once | INTRAVENOUS | Status: AC
  Administered 2024-06-02: 1000 mL via INTRAVENOUS

## 2024-06-02 MED ORDER — LACTATED RINGERS IV BOLUS
1000.0000 mL | Freq: Once | INTRAVENOUS | Status: AC
  Administered 2024-06-02: 1000 mL via INTRAVENOUS

## 2024-06-02 MED ORDER — HYDROMORPHONE HCL 1 MG/ML IJ SOLN
1.0000 mg | Freq: Once | INTRAMUSCULAR | Status: AC
  Administered 2024-06-02: 1 mg via INTRAVENOUS
  Filled 2024-06-02: qty 1

## 2024-06-02 MED ORDER — PROMETHAZINE HCL 25 MG RE SUPP: 25.0000 mg | Freq: Four times a day (QID) | RECTAL | 2 refills | Status: AC | PRN

## 2024-06-02 MED ORDER — PROCHLORPERAZINE EDISYLATE 10 MG/2ML IJ SOLN
10.0000 mg | Freq: Once | INTRAMUSCULAR | Status: AC
  Administered 2024-06-02: 10 mg via INTRAVENOUS
  Filled 2024-06-02: qty 2

## 2024-06-02 MED ORDER — PROMETHAZINE HCL 25 MG RE SUPP: 25.0000 mg | Freq: Four times a day (QID) | RECTAL | 0 refills | Status: DC | PRN

## 2024-06-02 MED ORDER — MAGNESIUM SULFATE 2 GM/50ML IV SOLN
2.0000 g | Freq: Once | INTRAVENOUS | Status: AC
  Administered 2024-06-02: 2 g via INTRAVENOUS
  Filled 2024-06-02: qty 50

## 2024-06-02 MED ORDER — PANTOPRAZOLE SODIUM 40 MG IV SOLR
40.0000 mg | Freq: Once | INTRAVENOUS | Status: AC
  Administered 2024-06-02: 40 mg via INTRAVENOUS
  Filled 2024-06-02: qty 10

## 2024-06-02 MED ORDER — DROPERIDOL 2.5 MG/ML IJ SOLN
2.5000 mg | Freq: Once | INTRAMUSCULAR | Status: AC
  Administered 2024-06-02: 2.5 mg via INTRAVENOUS
  Filled 2024-06-02: qty 2

## 2024-06-02 MED ORDER — HALOPERIDOL LACTATE 5 MG/ML IJ SOLN
5.0000 mg | Freq: Once | INTRAMUSCULAR | Status: AC
  Administered 2024-06-02: 5 mg via INTRAVENOUS
  Filled 2024-06-02: qty 1

## 2024-06-02 NOTE — Discharge Instructions (Addendum)
 Follow-up with the gastroenterologist in the next couple weeks

## 2024-06-02 NOTE — ED Provider Notes (Signed)
 [facility] AT [facility] Provider Note   CSN: [id number] Arrival date & time: 06/02/24  9680     Patient presents with: Chest Pain and Emesis   [name] is a 43 y.o. female.   The history is provided by the patient.  Chest Pain Associated symptoms: vomiting   Emesis  She has history of hypertension, GERD and recent hospitalization for erosive gastritis and was just discharged yesterday afternoon and comes in with recurrent epigastric pain with chest pain and associated nausea and vomiting.  Emesis has been bilious.  Symptoms are similar to what she had when she was admitted to the hospital.  She does admit to having had marijuana edibles before her recent hospitalization but denies heavy marijuana use.    Prior to Admission medications   Medication Sig Start Date End Date Taking? Authorizing Provider  gabapentin  (NEURONTIN ) 600 MG tablet Take 600 mg by mouth 4 (four) times daily. 04/17/24   [provider]  Multiple Vitamins-Minerals (MULTIVITAMIN ADULT) CHEW Chew 2 each by mouth daily.    [provider]  NURTEC 75 MG TBDP Take 1 tablet by mouth every other day. Patient not taking: Reported on 09/02/2023 08/26/23   [provider]  ondansetron  (ZOFRAN -ODT) 8 MG disintegrating tablet Take 1 tablet (8 mg total) by mouth every 8 (eight) hours as needed. 09/04/23   Ricky Fines, MD  pantoprazole  (PROTONIX ) 40 MG tablet Take 1 tablet (40 mg total) by mouth 2 (two) times daily. 09/04/23   Ricky Fines, MD  promethazine  (PHENERGAN ) 25 MG suppository Place 1 suppository (25 mg total) rectally every 6 (six) hours as needed for refractory nausea / vomiting. 09/04/23   Ricky Fines, MD  sucralfate  (CARAFATE ) 1 g tablet Take 1 tablet (1 g total) by mouth in the morning, at noon, and at bedtime for 10 days. 06/01/24 06/11/24  Darci Pore, MD  trazodone  (DESYREL ) 300 MG tablet Take 300 mg by mouth daily. 08/10/23    [provider]    Allergies: Demerol and Orange fruit [citrus]    Review of Systems  Cardiovascular:  Positive for chest pain.  Gastrointestinal:  Positive for vomiting.  All other systems reviewed and are negative.   Updated Vital Signs BP (!) 156/98   Pulse 68   Resp 11   Ht 5' 3 (1.6 m)   Wt 65.2 kg   SpO2 96%   BMI 25.46 kg/m   Physical Exam Vitals and nursing note reviewed.   43 year old female, appears mildly uncomfortable, but is in no acute distress. Vital signs are significant for elevated blood pressure. Oxygen saturation is 96%, which is normal. Head is normocephalic and atraumatic. PERRLA, EOMI.  Lungs are clear without rales, wheezes, or rhonchi. Chest is nontender. Heart has regular rate and rhythm without murmur. Abdomen is soft, flat, with moderate epigastric tenderness.  There is no rebound or guarding. Extremities have no cyanosis or edema, full range of motion is present. Skin is warm and dry without rash. Neurologic: Awake and alert, moves all extremities equally.  (all labs ordered are listed, but only abnormal results are displayed) Labs Reviewed  CBC WITH DIFFERENTIAL/PLATELET - Abnormal; Notable for the following components:      Result Value   RDW 11.2 (*)    All other components within normal limits  COMPREHENSIVE METABOLIC PANEL WITH GFR - Abnormal; Notable for the following components:   Glucose, Bld 113 (*)    BUN 5 (*)  Calcium 8.7 (*)    Alkaline Phosphatase 34 (*)    All other components within normal limits  LIPASE, BLOOD  MAGNESIUM   TROPONIN I (HIGH SENSITIVITY)  TROPONIN I (HIGH SENSITIVITY)    EKG: EKG Interpretation Date/Time:  Friday June 02 2024 03:33:41 EDT Ventricular Rate:  66 PR Interval:    QRS Duration:  89 QT Interval:  461 QTC Calculation: 484 R Axis:   71  Text Interpretation: Normal sinus rhythm Normal ECG When compared with ECG of 05/30/2024, Premature ventricular complexes are no longer  present Premature atrial complexes are no longer present Confirmed by Raford Lenis ([id number]) on 06/02/2024 3:39:09 AM    Procedures  Cardiac monitor shows normal sinus rhythm, per my interpretation. Medications Ordered in the ED  HYDROmorphone  (DILAUDID ) injection 1 mg (has no administration in time range)  haloperidol  lactate (HALDOL ) injection 5 mg (has no administration in time range)  sodium chloride  0.9 % bolus 1,000 mL (0 mLs Intravenous Stopped 06/02/24 0531)  pantoprazole  (PROTONIX ) injection 40 mg (40 mg Intravenous Given 06/02/24 0355)  prochlorperazine  (COMPAZINE ) injection 10 mg (10 mg Intravenous Given 06/02/24 0355)  HYDROmorphone  (DILAUDID ) injection 1 mg (1 mg Intravenous Given 06/02/24 0355)  magnesium  sulfate IVPB 2 g 50 mL (0 g Intravenous Stopped 06/02/24 0531)  HYDROmorphone  (DILAUDID ) injection 1 mg (1 mg Intravenous Given 06/02/24 0556)  droperidol  (INAPSINE ) 2.5 MG/ML injection 2.5 mg (2.5 mg Intravenous Given 06/02/24 0556)  lactated ringers  bolus 1,000 mL (1,000 mLs Intravenous New Bag/Given 06/02/24 0621)                                    Medical Decision Making Amount and/or Complexity of Data Reviewed Labs: ordered.  Risk Prescription drug management.   Epigastric and chest pain with associated nausea and vomiting.  This is a presentation with a wide range of treatment options and carries with it a high risk of morbidity and complications.  Differential diagnosis includes, but is not limited to, acute gastritis, pancreatitis, peptic ulcer disease, GERD, ACS, gastroparesis, cyclic vomiting syndrome.  I have reviewed her electrocardiogram, and my interpretation is normal ECG.  I have ordered repeat laboratory workup including troponin x 2, IV fluids, hydromorphone  for pain, prochlorperazine  for nausea.  I have reviewed her past records confirming hospitalization 05/30/2024-06/01/2024 for epigastric pain with vomiting found to be due to gastritis.  I have reviewed her laboratory  tests, and my interpretation is elevated random glucose level which will need to be followed as an outpatient, normal electrolytes, normal renal function, normal hepatic function, normal CBC, normal lipase, normal troponin x 2, normal magnesium  although at the lower end of normal.  I have ordered additional IV magnesium .  She had little relief of pain and nausea with above-noted treatment.  I have ordered an additional IV fluids, a second dose of hydromorphone , and a dose of droperidol  for nausea.  She had moderate relief of pain but still had persistent nausea.  I have ordered a third dose of hydromorphone  and a dose of haloperidol .  Case is signed out to Dr. Zammitt.     Final diagnoses:  Epigastric pain  Nausea and vomiting, unspecified vomiting type    ED Discharge Orders     None          Raford Lenis, MD 06/02/24 [phone]

## 2024-06-02 NOTE — ED Notes (Signed)
 Pt asking to be discharged, provider made aware

## 2024-06-02 NOTE — ED Triage Notes (Signed)
 Pov from home. Was dc yesterday at 6pm from being admitted. Was home for a little while and started feeling bad all over again. C/o chest pain 8/10, and abdominal pain 10/10 +emesis

## 2024-06-03 ENCOUNTER — Emergency Department (HOSPITAL_COMMUNITY)

## 2024-06-03 ENCOUNTER — Inpatient Hospital Stay (HOSPITAL_COMMUNITY)
Admission: EM | Admit: 2024-06-03 | Discharge: 2024-06-07 | DRG: 394 | Disposition: A | Discharge status: Home / Self Care | Attending: Internal Medicine | Admitting: Internal Medicine

## 2024-06-03 ENCOUNTER — Encounter (HOSPITAL_COMMUNITY): Payer: Self-pay

## 2024-06-03 ENCOUNTER — Other Ambulatory Visit: Payer: Self-pay

## 2024-06-03 DIAGNOSIS — R1010 Upper abdominal pain, unspecified: Principal | ICD-10-CM

## 2024-06-03 DIAGNOSIS — R1013 Epigastric pain: Secondary | ICD-10-CM | POA: Diagnosis not present

## 2024-06-03 DIAGNOSIS — Z833 Family history of diabetes mellitus: Secondary | ICD-10-CM | POA: Diagnosis not present

## 2024-06-03 DIAGNOSIS — Z9049 Acquired absence of other specified parts of digestive tract: Secondary | ICD-10-CM | POA: Diagnosis not present

## 2024-06-03 DIAGNOSIS — F12188 Cannabis abuse with other cannabis-induced disorder: Secondary | ICD-10-CM | POA: Insufficient documentation

## 2024-06-03 DIAGNOSIS — I10 Essential (primary) hypertension: Secondary | ICD-10-CM | POA: Diagnosis present

## 2024-06-03 DIAGNOSIS — Z818 Family history of other mental and behavioral disorders: Secondary | ICD-10-CM | POA: Diagnosis not present

## 2024-06-03 DIAGNOSIS — R1110 Vomiting, unspecified: Secondary | ICD-10-CM | POA: Diagnosis not present

## 2024-06-03 DIAGNOSIS — N83202 Unspecified ovarian cyst, left side: Secondary | ICD-10-CM | POA: Diagnosis present

## 2024-06-03 DIAGNOSIS — F1210 Cannabis abuse, uncomplicated: Secondary | ICD-10-CM | POA: Diagnosis present

## 2024-06-03 DIAGNOSIS — F1120 Opioid dependence, uncomplicated: Secondary | ICD-10-CM | POA: Diagnosis present

## 2024-06-03 DIAGNOSIS — F39 Unspecified mood [affective] disorder: Secondary | ICD-10-CM | POA: Diagnosis present

## 2024-06-03 DIAGNOSIS — Z9079 Acquired absence of other genital organ(s): Secondary | ICD-10-CM | POA: Diagnosis not present

## 2024-06-03 DIAGNOSIS — Z885 Allergy status to narcotic agent status: Secondary | ICD-10-CM

## 2024-06-03 DIAGNOSIS — Z8249 Family history of ischemic heart disease and other diseases of the circulatory system: Secondary | ICD-10-CM

## 2024-06-03 DIAGNOSIS — E860 Dehydration: Secondary | ICD-10-CM | POA: Diagnosis present

## 2024-06-03 DIAGNOSIS — Z91018 Allergy to other foods: Secondary | ICD-10-CM

## 2024-06-03 DIAGNOSIS — R109 Unspecified abdominal pain: Secondary | ICD-10-CM | POA: Diagnosis not present

## 2024-06-03 DIAGNOSIS — F17290 Nicotine dependence, other tobacco product, uncomplicated: Secondary | ICD-10-CM | POA: Diagnosis present

## 2024-06-03 DIAGNOSIS — R112 Nausea with vomiting, unspecified: Secondary | ICD-10-CM

## 2024-06-03 DIAGNOSIS — G8929 Other chronic pain: Secondary | ICD-10-CM | POA: Diagnosis present

## 2024-06-03 DIAGNOSIS — K219 Gastro-esophageal reflux disease without esophagitis: Secondary | ICD-10-CM | POA: Diagnosis present

## 2024-06-03 DIAGNOSIS — Z79899 Other long term (current) drug therapy: Secondary | ICD-10-CM | POA: Diagnosis not present

## 2024-06-03 DIAGNOSIS — E0590 Thyrotoxicosis, unspecified without thyrotoxic crisis or storm: Secondary | ICD-10-CM | POA: Diagnosis present

## 2024-06-03 DIAGNOSIS — E871 Hypo-osmolality and hyponatremia: Secondary | ICD-10-CM | POA: Diagnosis present

## 2024-06-03 DIAGNOSIS — R1115 Cyclical vomiting syndrome unrelated to migraine: Secondary | ICD-10-CM | POA: Diagnosis not present

## 2024-06-03 DIAGNOSIS — Z716 Tobacco abuse counseling: Secondary | ICD-10-CM | POA: Diagnosis not present

## 2024-06-03 DIAGNOSIS — M549 Dorsalgia, unspecified: Secondary | ICD-10-CM | POA: Diagnosis present

## 2024-06-03 HISTORY — DX: Gastric ulcer, unspecified as acute or chronic, without hemorrhage or perforation: K25.9

## 2024-06-03 HISTORY — DX: Gastritis, unspecified, without bleeding: K29.70

## 2024-06-03 LAB — URINALYSIS, ROUTINE W REFLEX MICROSCOPIC
Bacteria, UA: NONE SEEN
Bilirubin Urine: NEGATIVE
Glucose, UA: NEGATIVE mg/dL
Ketones, ur: 20 mg/dL — AB
Leukocytes,Ua: NEGATIVE
Nitrite: NEGATIVE
Protein, ur: NEGATIVE mg/dL
Specific Gravity, Urine: 1.01 (ref 1.005–1.030)
pH: 7 (ref 5.0–8.0)

## 2024-06-03 LAB — HCG, SERUM, QUALITATIVE: Preg, Serum: NEGATIVE

## 2024-06-03 LAB — BASIC METABOLIC PANEL WITH GFR
Anion gap: 11 (ref 5–15)
BUN: 5 mg/dL — ABNORMAL LOW (ref 6–20)
CO2: 26 mmol/L (ref 22–32)
Calcium: 8.6 mg/dL — ABNORMAL LOW (ref 8.9–10.3)
Chloride: 99 mmol/L (ref 98–111)
Creatinine, Ser: 0.68 mg/dL (ref 0.44–1.00)
GFR, Estimated: >60 mL/min (ref >60)
Glucose, Bld: 102 mg/dL — ABNORMAL HIGH (ref 70–99)
Potassium: 3.5 mmol/L (ref 3.5–5.1)
Sodium: 136 mmol/L (ref 135–145)

## 2024-06-03 LAB — RAPID URINE DRUG SCREEN, HOSP PERFORMED
Amphetamines: NOT DETECTED
Barbiturates: NOT DETECTED
Benzodiazepines: NOT DETECTED
Cocaine: NOT DETECTED
Opiates: POSITIVE — AB
Tetrahydrocannabinol: POSITIVE — AB

## 2024-06-03 LAB — HEPATIC FUNCTION PANEL
ALT: 29 U/L (ref 0–44)
AST: 15 U/L (ref 15–41)
Albumin: 3.6 g/dL (ref 3.5–5.0)
Alkaline Phosphatase: 30 U/L — ABNORMAL LOW (ref 38–126)
Bilirubin, Direct: 0.1 mg/dL (ref 0.0–0.2)
Indirect Bilirubin: 0.8 mg/dL (ref 0.3–0.9)
Total Bilirubin: 0.9 mg/dL (ref 0.0–1.2)
Total Protein: 6 g/dL — ABNORMAL LOW (ref 6.5–8.1)

## 2024-06-03 LAB — CBC
HCT: 40.2 % (ref 36.0–46.0)
Hemoglobin: 14.1 g/dL (ref 12.0–15.0)
MCH: 32.6 pg (ref 26.0–34.0)
MCHC: 35.1 g/dL (ref 30.0–36.0)
MCV: 93.1 fL (ref 80.0–100.0)
Platelets: 217 K/uL (ref 150–400)
RBC: 4.32 MIL/uL (ref 3.87–5.11)
RDW: 11.3 % — ABNORMAL LOW (ref 11.5–15.5)
WBC: 8.7 K/uL (ref 4.0–10.5)
nRBC: 0 % (ref 0.0–0.2)

## 2024-06-03 LAB — VITAMIN B12: Vitamin B-12: 614 pg/mL (ref 180–914)

## 2024-06-03 LAB — TROPONIN I (HIGH SENSITIVITY)
Troponin I (High Sensitivity): 3 ng/L (ref <18)
Troponin I (High Sensitivity): 3 ng/L (ref <18)

## 2024-06-03 LAB — CORTISOL-AM, BLOOD: Cortisol - AM: 12.2 ug/dL (ref 6.7–22.6)

## 2024-06-03 LAB — LIPASE, BLOOD: Lipase: 31 U/L (ref 11–51)

## 2024-06-03 LAB — TSH: TSH: 0.292 u[IU]/mL — ABNORMAL LOW (ref 0.350–4.500)

## 2024-06-03 LAB — FOLATE: Folate: 27.8 ng/mL (ref >5.9)

## 2024-06-03 MED ORDER — ACETAMINOPHEN 325 MG PO TABS
650.0000 mg | ORAL_TABLET | Freq: Four times a day (QID) | ORAL | Status: DC | PRN
Start: 2024-06-03 — End: 2024-06-07

## 2024-06-03 MED ORDER — SODIUM CHLORIDE 0.9 % IV BOLUS
1000.0000 mL | Freq: Once | INTRAVENOUS | Status: AC
  Administered 2024-06-03: 1000 mL via INTRAVENOUS

## 2024-06-03 MED ORDER — ONDANSETRON HCL 4 MG/2ML IJ SOLN
4.0000 mg | Freq: Once | INTRAMUSCULAR | Status: AC | PRN
  Administered 2024-06-03: 4 mg via INTRAVENOUS
  Filled 2024-06-03: qty 2

## 2024-06-03 MED ORDER — ENOXAPARIN SODIUM 40 MG/0.4ML IJ SOSY
40.0000 mg | PREFILLED_SYRINGE | INTRAMUSCULAR | Status: DC
  Administered 2024-06-03: 40 mg via SUBCUTANEOUS
  Filled 2024-06-03 (×4): qty 0.4

## 2024-06-03 MED ORDER — HYDROMORPHONE HCL 1 MG/ML IJ SOLN
0.5000 mg | INTRAMUSCULAR | Status: DC | PRN
  Administered 2024-06-03 – 2024-06-07 (×26): 0.5 mg via INTRAVENOUS
  Filled 2024-06-03 (×18): qty 0.5

## 2024-06-03 MED ORDER — PROCHLORPERAZINE EDISYLATE 10 MG/2ML IJ SOLN
10.0000 mg | Freq: Once | INTRAMUSCULAR | Status: AC
  Administered 2024-06-03: 10 mg via INTRAVENOUS
  Filled 2024-06-03: qty 2

## 2024-06-03 MED ORDER — PANTOPRAZOLE SODIUM 40 MG IV SOLR
40.0000 mg | Freq: Once | INTRAVENOUS | Status: AC
  Administered 2024-06-03: 40 mg via INTRAVENOUS
  Filled 2024-06-03: qty 10

## 2024-06-03 MED ORDER — TRAZODONE HCL 50 MG PO TABS
300.0000 mg | ORAL_TABLET | Freq: Every day | ORAL | Status: DC
  Administered 2024-06-03 – 2024-06-06 (×6): 300 mg via ORAL
  Filled 2024-06-03 (×4): qty 6

## 2024-06-03 MED ORDER — SODIUM CHLORIDE 0.9 % IV BOLUS
500.0000 mL | Freq: Once | INTRAVENOUS | Status: AC
  Administered 2024-06-03: 500 mL via INTRAVENOUS

## 2024-06-03 MED ORDER — PANTOPRAZOLE SODIUM 40 MG IV SOLR
40.0000 mg | Freq: Two times a day (BID) | INTRAVENOUS | Status: DC
  Administered 2024-06-03 – 2024-06-07 (×13): 40 mg via INTRAVENOUS
  Filled 2024-06-03 (×8): qty 10

## 2024-06-03 MED ORDER — BUSPIRONE HCL 5 MG PO TABS
10.0000 mg | ORAL_TABLET | Freq: Three times a day (TID) | ORAL | Status: DC
Start: 2024-06-03 — End: 2024-06-07
  Administered 2024-06-03 – 2024-06-07 (×18): 10 mg via ORAL
  Filled 2024-06-03 (×14): qty 2

## 2024-06-03 MED ORDER — POTASSIUM CHLORIDE IN NACL 20-0.9 MEQ/L-% IV SOLN: INTRAVENOUS | Status: AC

## 2024-06-03 MED ORDER — HYDROMORPHONE HCL 1 MG/ML IJ SOLN
1.0000 mg | Freq: Once | INTRAMUSCULAR | Status: AC
  Administered 2024-06-03: 1 mg via INTRAVENOUS
  Filled 2024-06-03: qty 1

## 2024-06-03 MED ORDER — PROCHLORPERAZINE EDISYLATE 10 MG/2ML IJ SOLN
10.0000 mg | Freq: Four times a day (QID) | INTRAMUSCULAR | Status: DC
  Administered 2024-06-03 – 2024-06-06 (×18): 10 mg via INTRAVENOUS
  Filled 2024-06-03 (×15): qty 2

## 2024-06-03 MED ORDER — ACETAMINOPHEN 650 MG RE SUPP: 650.0000 mg | Freq: Four times a day (QID) | RECTAL | Status: DC | PRN

## 2024-06-03 MED ORDER — SODIUM CHLORIDE 0.9 % IV SOLN
25.0000 mg | Freq: Once | INTRAVENOUS | Status: AC
  Administered 2024-06-03: 25 mg via INTRAVENOUS
  Filled 2024-06-03: qty 1

## 2024-06-03 NOTE — H&P (Signed)
 History and Physical    Patient: [name] FMW:[id number] DOB: 1981-10-12 DOA: 06/03/2024 DOS: the patient was seen and examined on 06/03/2024 PCP: [facility]  Patient coming from: Home  Chief Complaint:  Chief Complaint  Patient presents with   Chest Pain   Abdominal Pain   Weakness   Emesis   HPI: [name] is a 43 year old female with a history of anxiety, hypertension, erosive gastritis, migraine headache, chronic back pain with opioid dependence presents with abdominal pain, chest pain, and nausea and vomiting.  Notably, the patient was recently hospitalized from 05/30/2024 to 06/01/2024 secondary to abdominal pain with intractable nausea and vomiting.  She was treated symptomatically.  She gradually improved, but states that her abdominal pain never really goes entirely away.  However she did tolerate her diet at the time of discharge.  She was discharged home with Carafate  and pantoprazole  twice daily. Unfortunately, she returned to the hospital the next day on 06/02/24.  She was treated symptomatically in the emergency department with antiemetics and opioids.  She was discharged home in stable condition. She returned once again on the morning of 06/03/2024 with the above symptoms.  She states she did have some hematemesis on the morning of 06/03/2024.  Her last bowel movement was on the morning of 06/03/2024.  There is no hematochezia or melena.  She denies any worsening shortness of breath, cough, hemoptysis.  She had low-grade temperature of 99.8 F.  She denies any rashes or synovitis.  She denies any dysuria or hematuria.  She denies any palpitations, shortness breath, dizziness, syncope.  She has had worsening generalized weakness.  Notably, the patient had similar presentation in November 2024.  She underwent EGD 09/03/2023 which showed erosive gastritis with stigmata of recent bleeding.  In the ED, the patient was afebrile hemodynamically stable with oxygen  saturation 99% room air.  WBC 8.7, hemoglobin 14.1, platelet 217.  Sodium 136, potassium 3.5, bicarbonate 26, serum creatinine 0.68.  LFTs unremarkable.  Lipase 31.  EKG showed sinus rhythm without any acute ST changes.  Troponin 3.  06/02/2024 troponin 4>> 4. In the ED, the patient was given hydromorphone  1 mg x 2, pantoprazole , Compazine , and Phenergan .  GI was consulted to assist with management.  Review of Systems: As mentioned in the history of present illness. All other systems reviewed and are negative. Past Medical History:  Diagnosis Date   Acid reflux    Anxiety    Arthritis    Environmental allergies    Gastric ulcer    Gastritis    Hypertension    Migraine    Past Surgical History:  Procedure Laterality Date   arm surgery Left    plate in arm   BILATERAL SALPINGECTOMY     BIOPSY  09/03/2023   Procedure: BIOPSY;  Surgeon: Eartha Angelia Sieving, MD;  Location: [facility];  Service: Gastroenterology;;   BONE BIOPSY Right    thumb   BRAIN SURGERY     evacuation of hematoma   CESAREAN SECTION     x2   CHOLECYSTECTOMY     ECTOPIC PREGNANCY SURGERY     x2   ENDOMETRIAL ABLATION  01/18/2018   Procedure: ENDOMETRIAL ABLATION WITH MINERVA;  Surgeon: Edsel Norleen GAILS, MD;  Location: [facility];  Service: Gynecology;;   ESOPHAGOGASTRODUODENOSCOPY (EGD) WITH PROPOFOL  N/A 01/30/2014   Procedure: ESOPHAGOGASTRODUODENOSCOPY (EGD) WITH PROPOFOL ;  Surgeon: Margo LITTIE Haddock, MD;  Location: [facility];  Service: Endoscopy;  Laterality: N/A;   ESOPHAGOGASTRODUODENOSCOPY (  EGD) WITH PROPOFOL  N/A 09/03/2023   Procedure: ESOPHAGOGASTRODUODENOSCOPY (EGD) WITH PROPOFOL ;  Surgeon: Eartha Angelia Sieving, MD;  Location: [facility];  Service: Gastroenterology;  Laterality: N/A;   HYSTEROSCOPY WITH D & C N/A 08/13/2015   Procedure: DILATATION AND CURETTAGE /HYSTEROSCOPY;  Surgeon: Norleen Edsel GAILS, MD;  Location: [facility];  Service: Gynecology;  Laterality: N/A;   HYSTEROSCOPY WITH D & C  01/18/2018    Procedure: CERVICAL DILATATION /HYSTEROSCOPY;  Surgeon: Edsel Norleen GAILS, MD;  Location: [facility];  Service: Gynecology;;   LEG SURGERY Left    femur rodding and removal of part of left femur   otif ankle Right    Social History:  reports that she quit smoking about 12 years ago. Her smoking use included cigarettes. She started smoking about 32 years ago. She has a 20 pack-year smoking history. She has never used smokeless tobacco. She reports that she does not drink alcohol and does not use drugs.  Allergies  Allergen Reactions   Orange Fruit [Citrus] Other (See Comments)    Fever blisters   Demerol Nausea Only    Family History  Problem Relation Age of Onset   Mental illness Mother    Diabetes Mother    Obesity Mother    Heart disease Father    Obesity Sister    Lupus Paternal Aunt    Cancer Maternal Grandfather        GASTRIC   Lupus Paternal Grandmother    Diabetes Son        boarderline   Arthritis Other    Cancer Other    Diabetes Other     Prior to Admission medications   Medication Sig Start Date End Date Taking? Authorizing Provider  gabapentin  (NEURONTIN ) 600 MG tablet Take 600 mg by mouth 4 (four) times daily. 04/17/24   [provider]  Multiple Vitamins-Minerals (MULTIVITAMIN ADULT) CHEW Chew 2 each by mouth daily.    [provider]  NURTEC 75 MG TBDP Take 1 tablet by mouth every other day. Patient not taking: Reported on 09/02/2023 08/26/23   [provider]  ondansetron  (ZOFRAN -ODT) 8 MG disintegrating tablet Take 1 tablet (8 mg total) by mouth every 8 (eight) hours as needed. 09/04/23   Ricky Fines, MD  pantoprazole  (PROTONIX ) 40 MG tablet Take 1 tablet (40 mg total) by mouth 2 (two) times daily. 09/04/23   Ricky Fines, MD  promethazine  (PHENERGAN ) 25 MG suppository Place 1 suppository (25 mg total) rectally every 6 (six) hours as needed for nausea or vomiting. 06/02/24   Suzette Pac, MD  sucralfate  (CARAFATE ) 1 g tablet Take 1  tablet (1 g total) by mouth in the morning, at noon, and at bedtime for 10 days. 06/01/24 06/11/24  Darci Pore, MD  trazodone  (DESYREL ) 300 MG tablet Take 300 mg by mouth daily. 08/10/23   [provider]    Physical Exam: Vitals:   06/03/24 1122 06/03/24 1205 06/03/24 1230 06/03/24 1300  BP:  (!) 162/84 (!) 162/92 (!) 151/84  Pulse: 68 61 70   Resp: 19 12 15 19   Temp:      TempSrc:      SpO2: 97% 99% 100%   Weight:      Height:       GENERAL:  A&O x 3, NAD, well developed, cooperative, follows commands HEENT: [place]/AT, No thrush, No icterus, No oral ulcers Neck:  No neck mass, No meningismus, soft, supple CV: RRR, no S3, no S4, no rub, no JVD Lungs:  CTA, no wheeze, no rhonchi, good air movement Abd: soft/diffuse +BS, nondistended Ext: No edema, no lymphangitis, no cyanosis, no rashes Neuro:  CN II-XII intact, strength 4/5 in RUE, RLE, strength 4/5 LUE, LLE; sensation intact bilateral; no dysmetria; babinski equivocal  Data Reviewed: Data reviewed above in history Assessment and Plan: Cyclic vomiting syndrome -Likely incited from the patient's cannabis use - Patient gives a history of using CBD Gummies regularly at bedtime for period of time - GI consult - Start pantoprazole  IV twice daily - Clear liquid diet - Antiemetics around-the-clock - continue IVF - am cortisol - TSH  Intractable abdominal pain -06/03/24 acute abd series--neg - 05/30/2024 CT abd/pelvis--negative for acute findings - Judicious opioids  Opioid dependence/chronic back pain - PDMP reviewed -Norco 10/325, #120, last refill 7/26 - Gabapentin  600 mg, #120, last refill 04/17/2024  Nicotine usage - Patient vapes regularly  Mood disorder - Continue trazodone  and buspirone     Advance Care Planning: FULL  Consults: GI  Family Communication: spouse 8/9  Severity of Illness: The appropriate patient status for this patient is INPATIENT. Inpatient status is judged to be reasonable and  necessary in order to provide the required intensity of service to ensure the patient's safety. The patient's presenting symptoms, physical exam findings, and initial radiographic and laboratory data in the context of their chronic comorbidities is felt to place them at high risk for further clinical deterioration. Furthermore, it is not anticipated that the patient will be medically stable for discharge from the hospital within 2 midnights of admission.   * I certify that at the point of admission it is my clinical judgment that the patient will require inpatient hospital care spanning beyond 2 midnights from the point of admission due to high intensity of service, high risk for further deterioration and high frequency of surveillance required.*  Author: Alm Schneider, MD 06/03/2024 2:09 PM  For on call review [email or web address].

## 2024-06-03 NOTE — ED Provider Notes (Signed)
 [place] EMERGENCY DEPARTMENT AT [facility] [place] Provider Note   CSN: [id number] Arrival date & time: 06/03/24  1103     Patient presents with: Chest Pain, Abdominal Pain, Weakness, and Emesis   [name] is a 43 y.o. female.   Patient with a history of gastritis.  She comes in with abdominal pain and vomiting.  She was admitted from August 5 to August 7 with gastritis and then went home.  She returned to the ED on August 8 and was treated and discharged home and returned again today with persistent vomiting and abdominal pain  The history is provided by the patient and medical records.  Abdominal Pain Pain location:  Epigastric Pain quality: aching   Pain radiates to:  Does not radiate Pain severity:  Moderate Onset quality:  Sudden Timing:  Intermittent Progression:  Waxing and waning Associated symptoms: vomiting   Associated symptoms: no chest pain, no cough, no diarrhea, no fatigue and no hematuria   Weakness Associated symptoms: abdominal pain and vomiting   Associated symptoms: no chest pain, no cough, no diarrhea, no frequency, no headaches and no seizures   Emesis Associated symptoms: abdominal pain   Associated symptoms: no cough, no diarrhea and no headaches        Prior to Admission medications   Medication Sig Start Date End Date Taking? Authorizing Provider  gabapentin  (NEURONTIN ) 600 MG tablet Take 600 mg by mouth 4 (four) times daily. 04/17/24   [provider]  Multiple Vitamins-Minerals (MULTIVITAMIN ADULT) CHEW Chew 2 each by mouth daily.    [provider]  NURTEC 75 MG TBDP Take 1 tablet by mouth every other day. Patient not taking: Reported on 09/02/2023 08/26/23   [provider]  ondansetron  (ZOFRAN -ODT) 8 MG disintegrating tablet Take 1 tablet (8 mg total) by mouth every 8 (eight) hours as needed. 09/04/23   Ricky Fines, MD  pantoprazole  (PROTONIX ) 40 MG tablet Take 1 tablet (40 mg total) by mouth 2 (two)  times daily. 09/04/23   Ricky Fines, MD  promethazine  (PHENERGAN ) 25 MG suppository Place 1 suppository (25 mg total) rectally every 6 (six) hours as needed for nausea or vomiting. 06/02/24   Suzette Pac, MD  sucralfate  (CARAFATE ) 1 g tablet Take 1 tablet (1 g total) by mouth in the morning, at noon, and at bedtime for 10 days. 06/01/24 06/11/24  Darci Pore, MD  trazodone  (DESYREL ) 300 MG tablet Take 300 mg by mouth daily. 08/10/23   [provider]    Allergies: Orange fruit [citrus] and Demerol    Review of Systems  Constitutional:  Negative for appetite change and fatigue.  HENT:  Negative for congestion, ear discharge and sinus pressure.   Eyes:  Negative for discharge.  Respiratory:  Negative for cough.   Cardiovascular:  Negative for chest pain.  Gastrointestinal:  Positive for abdominal pain and vomiting. Negative for diarrhea.  Genitourinary:  Negative for frequency and hematuria.  Musculoskeletal:  Negative for back pain.  Skin:  Negative for rash.  Neurological:  Negative for seizures and headaches.  Psychiatric/Behavioral:  Negative for hallucinations.     Updated Vital Signs BP (!) 151/84   Pulse 70   Temp 99 F (37.2 C) (Oral)   Resp 19   Ht 5' 3 (1.6 m)   Wt 64.9 kg   SpO2 100%   BMI 25.33 kg/m   Physical Exam Vitals and nursing note reviewed.  Constitutional:      Appearance: She is well-developed.  HENT:     Head: Normocephalic.     Nose: Nose normal.  Eyes:     General: No scleral icterus.    Conjunctiva/sclera: Conjunctivae normal.  Neck:     Thyroid : No thyromegaly.  Cardiovascular:     Rate and Rhythm: Normal rate and regular rhythm.     Heart sounds: No murmur heard.    No friction rub. No gallop.  Pulmonary:     Breath sounds: No stridor. No wheezing or rales.  Chest:     Chest wall: No tenderness.  Abdominal:     General: There is no distension.     Tenderness: There is abdominal tenderness. There is no rebound.   Musculoskeletal:        General: Normal range of motion.     Cervical back: Neck supple.  Lymphadenopathy:     Cervical: No cervical adenopathy.  Skin:    Findings: No erythema or rash.  Neurological:     Mental Status: She is alert and oriented to person, place, and time.     Motor: No abnormal muscle tone.     Coordination: Coordination normal.  Psychiatric:        Behavior: Behavior normal.     (all labs ordered are listed, but only abnormal results are displayed) Labs Reviewed  BASIC METABOLIC PANEL WITH GFR - Abnormal; Notable for the following components:      Result Value   Glucose, Bld 102 (*)    BUN 5 (*)    Calcium 8.6 (*)    All other components within normal limits  CBC - Abnormal; Notable for the following components:   RDW 11.3 (*)    All other components within normal limits  HEPATIC FUNCTION PANEL - Abnormal; Notable for the following components:   Total Protein 6.0 (*)    Alkaline Phosphatase 30 (*)    All other components within normal limits  LIPASE, BLOOD  HCG, SERUM, QUALITATIVE  URINALYSIS, ROUTINE W REFLEX MICROSCOPIC  RAPID URINE DRUG SCREEN, HOSP PERFORMED  TROPONIN I (HIGH SENSITIVITY)  TROPONIN I (HIGH SENSITIVITY)    EKG: None  Radiology: DG Abd Portable 2 Views Result Date: 06/03/2024 CLINICAL DATA:  Chest pain and abdominal pain. EXAM: PORTABLE ABDOMEN - 2 VIEW COMPARISON:  None Available. FINDINGS: Stool in the ascending colon. No small bowel dilatation. No unexpected radiopaque calculi. Visualized chest is grossly unremarkable. Partially imaged intramedullary rod and proximal interlocking screw in the proximal left femur. IMPRESSION: No acute findings. Electronically Signed   By: Newell Eke M.D.   On: 06/03/2024 13:12   DG Chest 2 View Result Date: 06/03/2024 CLINICAL DATA:  Chest pain and abdominal pain. EXAM: CHEST - 2 VIEW COMPARISON:  05/30/2024. FINDINGS: Trachea is midline. Heart size normal. Lungs are clear. No pleural fluid.  IMPRESSION: Negative. Electronically Signed   By: Newell Eke M.D.   On: 06/03/2024 11:52     Procedures   Medications Ordered in the ED  ondansetron  (ZOFRAN ) injection 4 mg (4 mg Intravenous Given 06/03/24 1152)  promethazine  (PHENERGAN ) 25 mg in sodium chloride  0.9 % 50 mL IVPB (0 mg Intravenous Stopped 06/03/24 1224)  sodium chloride  0.9 % bolus 1,000 mL (0 mLs Intravenous Stopped 06/03/24 1250)  pantoprazole  (PROTONIX ) injection 40 mg (40 mg Intravenous Given 06/03/24 1225)  HYDROmorphone  (DILAUDID ) injection 1 mg (1 mg Intravenous Given 06/03/24 1225)  sodium chloride  0.9 % bolus 500 mL (0 mLs Intravenous Stopped 06/03/24 1334)  prochlorperazine  (COMPAZINE ) injection 10 mg (10 mg Intravenous Given  06/03/24 1333)  HYDROmorphone  (DILAUDID ) injection 1 mg (1 mg Intravenous Given 06/03/24 1333)                                    Medical Decision Making Amount and/or Complexity of Data Reviewed Labs: ordered. Radiology: ordered.  Risk Prescription drug management. Decision regarding hospitalization.   Patient with gastritis and persistent vomiting.  She will be admitted to medicine     Final diagnoses:  Pain of upper abdomen  Nausea and vomiting, unspecified vomiting type    ED Discharge Orders     None          Suzette Pac, MD 06/03/24 [phone]

## 2024-06-03 NOTE — Hospital Course (Signed)
 43 year old female with a history of anxiety, hypertension, erosive gastritis, migraine headache, chronic back pain with opioid dependence presents with abdominal pain, chest pain, and nausea and vomiting.  Notably, the patient was recently hospitalized from 05/30/2024 to 06/01/2024 secondary to abdominal pain with intractable nausea and vomiting.  She was treated symptomatically.  She gradually improved, but states that her abdominal pain never really goes entirely away.  However she did tolerate her diet at the time of discharge.  She was discharged home with Carafate  and pantoprazole  twice daily. Unfortunately, she returned to the hospital the next day on 06/02/24.  She was treated symptomatically in the emergency department with antiemetics and opioids.  She was discharged home in stable condition. She returned once again on the morning of 06/03/2024 with the above symptoms.  She states she did have some hematemesis on the morning of 06/03/2024.  Her last bowel movement was on the morning of 06/03/2024.  There is no hematochezia or melena.  She denies any worsening shortness of breath, cough, hemoptysis.  She had low-grade temperature of 99.8 F.  She denies any rashes or synovitis.  She denies any dysuria or hematuria.  She denies any palpitations, shortness breath, dizziness, syncope.  She has had worsening generalized weakness.  Notably, the patient had similar presentation in November 2024.  She underwent EGD 09/03/2023 which showed erosive gastritis with stigmata of recent bleeding.  In the ED, the patient was afebrile hemodynamically stable with oxygen saturation 99% room air.  WBC 8.7, hemoglobin 14.1, platelet 217.  Sodium 136, potassium 3.5, bicarbonate 26, serum creatinine 0.68.  LFTs unremarkable.  Lipase 31.  EKG showed sinus rhythm without any acute ST changes.  Troponin 3.  06/02/2024 troponin 4>> 4. In the ED, the patient was given hydromorphone  1 mg x 2, pantoprazole , Compazine , and Phenergan .  GI was  consulted to assist with management.

## 2024-06-03 NOTE — Progress Notes (Signed)
 Patient arrived to room 336, VS stable. Patient oriented to room and call bell in reach.

## 2024-06-03 NOTE — ED Notes (Signed)
 ED TO INPATIENT HANDOFF REPORT  ED Nurse Name and Phone #: Philippe RN [phone]  S Name/Age/Gender [name] 43 y.o. female Room/Bed: APA02/APA02  Code Status   Code Status: Prior  Home/SNF/Other Home Patient oriented to: self, place, time, and situation Is this baseline? Yes   Triage Complete: Triage complete  Chief Complaint Intractable vomiting [R11.10]  Triage Note Pt recently d/c from hospital and states she was here yesterday but chest pain and abd pain has come back full force.    Allergies Allergies  Allergen Reactions   Orange Fruit [Citrus] Other (See Comments)    Fever blisters   Demerol Nausea Only    Level of Care/Admitting Diagnosis ED Disposition     ED Disposition  Admit   Condition  --   Comment  Hospital Area: [facility] [[id number]]  Level of Care: Med-Surg [16]  Covid Evaluation: Asymptomatic - no recent exposure (last 10 days) testing not required  Diagnosis: Intractable vomiting [641402]  Admitting Physician: TAT, DAVID [[id number]]  Attending Physician: TAT, DAVID [email or web address]  Certification:: I certify this patient will need inpatient services for at least 2 midnights  Expected Medical Readiness: 06/05/2024          B Medical/Surgery History Past Medical History:  Diagnosis Date   Acid reflux    Anxiety    Arthritis    Environmental allergies    Gastric ulcer    Gastritis    Hypertension    Migraine    Past Surgical History:  Procedure Laterality Date   arm surgery Left    plate in arm   BILATERAL SALPINGECTOMY     BIOPSY  09/03/2023   Procedure: BIOPSY;  Surgeon: Eartha Angelia Sieving, MD;  [facility];  Service: Gastroenterology;;   BONE BIOPSY Right    thumb   BRAIN SURGERY     evacuation of hematoma   CESAREAN SECTION     x2   CHOLECYSTECTOMY     ECTOPIC PREGNANCY SURGERY     x2   ENDOMETRIAL ABLATION  01/18/2018   Procedure: ENDOMETRIAL ABLATION WITH MINERVA;  Surgeon: Edsel Norleen GAILS, MD;   Location: [facility];  Service: Gynecology;;   ESOPHAGOGASTRODUODENOSCOPY (EGD) WITH PROPOFOL  N/A 01/30/2014   Procedure: ESOPHAGOGASTRODUODENOSCOPY (EGD) WITH PROPOFOL ;  Surgeon: Margo LITTIE Haddock, MD;  Location: [facility];  Service: Endoscopy;  Laterality: N/A;   ESOPHAGOGASTRODUODENOSCOPY (EGD) WITH PROPOFOL  N/A 09/03/2023   Procedure: ESOPHAGOGASTRODUODENOSCOPY (EGD) WITH PROPOFOL ;  Surgeon: Eartha Angelia Sieving, MD;  [facility];  Service: Gastroenterology;  Laterality: N/A;   HYSTEROSCOPY WITH D & C N/A 08/13/2015   Procedure: DILATATION AND CURETTAGE /HYSTEROSCOPY;  Surgeon: Norleen Edsel GAILS, MD;  Location: [facility];  Service: Gynecology;  Laterality: N/A;   HYSTEROSCOPY WITH D & C  01/18/2018   Procedure: CERVICAL DILATATION /HYSTEROSCOPY;  Surgeon: Edsel Norleen GAILS, MD;  Location: [facility];  Service: Gynecology;;   LEG SURGERY Left    femur rodding and removal of part of left femur   otif ankle Right      A IV Location/Drains/Wounds Patient Lines/Drains/Airways Status     Active Line/Drains/Airways     Name Placement date Placement time Site Days   Peripheral IV 06/03/24 20 G Left Antecubital 06/03/24  1128  Antecubital  less than 1            Intake/Output Last 24 hours  Intake/Output Summary (Last 24 hours) at 06/03/2024 1420 Last data filed at 06/03/2024 1334 Gross per 24 hour  Intake 500 ml  Output --  Net 500 ml    Labs/Imaging Results for orders placed or performed during the hospital encounter of 06/03/24 (from the past 48 hours)  Basic metabolic panel     Status: Abnormal   Collection Time: 06/03/24 11:25 AM  Result Value Ref Range   Sodium 136 135 - 145 mmol/L   Potassium 3.5 3.5 - 5.1 mmol/L   Chloride 99 98 - 111 mmol/L   CO2 26 22 - 32 mmol/L   Glucose, Bld 102 (H) 70 - 99 mg/dL    Comment: Glucose reference range applies only to samples taken after fasting for at least 8 hours.   BUN 5 (L) 6 - 20 mg/dL   Creatinine, Ser 9.31 0.44 - 1.00 mg/dL    Calcium 8.6 (L) 8.9 - 10.3 mg/dL   GFR, Estimated >39 >39 mL/min    Comment: (NOTE) Calculated using the CKD-EPI Creatinine Equation (2021)    Anion gap 11 5 - 15    Comment: Performed at [facility], [address] [place], [place] [address]  CBC     Status: Abnormal   Collection Time: 06/03/24 11:25 AM  Result Value Ref Range   WBC 8.7 4.0 - 10.5 K/uL   RBC 4.32 3.87 - 5.11 MIL/uL   Hemoglobin 14.1 12.0 - 15.0 g/dL   HCT 59.7 63.9 - 53.9 %   MCV 93.1 80.0 - 100.0 fL   MCH 32.6 26.0 - 34.0 pg   MCHC 35.1 30.0 - 36.0 g/dL   RDW 88.6 (L) 88.4 - 84.4 %   Platelets 217 150 - 400 K/uL   nRBC 0.0 0.0 - 0.2 %    Comment: Performed at [facility], [address] [place], [place] [address]  Troponin I (High Sensitivity)     Status: None   Collection Time: 06/03/24 11:25 AM  Result Value Ref Range   Troponin I (High Sensitivity) 3 <18 ng/L    Comment: (NOTE) Elevated high sensitivity troponin I (hsTnI) values and significant  changes across serial measurements may suggest ACS but many other  chronic and acute conditions are known to elevate hsTnI results.  Refer to the Links section for chest pain algorithms and additional  guidance. Performed at [facility], [address] [place], [place] [address]   Lipase, blood     Status: None   Collection Time: 06/03/24 11:25 AM  Result Value Ref Range   Lipase 31 11 - 51 U/L    Comment: Performed at [facility], [address], [place], [place] [address]  hCG, serum, qualitative     Status: None   Collection Time: 06/03/24 11:25 AM  Result Value Ref Range   Preg, Serum NEGATIVE NEGATIVE    Comment:        THE SENSITIVITY OF THIS METHODOLOGY IS >10 mIU/mL. Performed at [facility], [address], [place], [place] [address]   Hepatic function panel     Status: Abnormal   Collection Time: 06/03/24 12:20 PM  Result Value Ref Range   Total Protein 6.0 (L) 6.5 - 8.1 g/dL   Albumin 3.6 3.5 - 5.0 g/dL   AST 15 15 - 41 U/L   ALT 29 0  - 44 U/L   Alkaline Phosphatase 30 (L) 38 - 126 U/L   Total Bilirubin 0.9 0.0 - 1.2 mg/dL   Bilirubin, Direct 0.1 0.0 - 0.2 mg/dL   Indirect Bilirubin 0.8 0.3 - 0.9 mg/dL    Comment: Performed at [facility], [address] [place], [place] [address]  Rapid urine drug screen (hospital performed)     Status: Abnormal   Collection Time: 06/03/24  1:00 PM  Result Value Ref Range   Opiates POSITIVE (A) NONE DETECTED   Cocaine NONE DETECTED NONE DETECTED   Benzodiazepines NONE DETECTED NONE DETECTED   Amphetamines NONE DETECTED NONE DETECTED   Tetrahydrocannabinol POSITIVE (A) NONE DETECTED   Barbiturates NONE DETECTED NONE DETECTED    Comment: (NOTE) DRUG SCREEN FOR MEDICAL PURPOSES ONLY.  IF CONFIRMATION IS NEEDED FOR ANY PURPOSE, NOTIFY LAB WITHIN 5 DAYS.  LOWEST DETECTABLE LIMITS FOR URINE DRUG SCREEN Drug Class                     Cutoff (ng/mL) Amphetamine and metabolites    1000 Barbiturate and metabolites    200 Benzodiazepine                 200 Opiates and metabolites        300 Cocaine and metabolites        300 THC                            50 Performed at [facility], [address], [place], [place] [address]   Troponin I (High Sensitivity)     Status: None   Collection Time: 06/03/24  1:04 PM  Result Value Ref Range   Troponin I (High Sensitivity) 3 <18 ng/L    Comment: (NOTE) Elevated high sensitivity troponin I (hsTnI) values and significant  changes across serial measurements may suggest ACS but many other  chronic and acute conditions are known to elevate hsTnI results.  Refer to the Links section for chest pain algorithms and additional  guidance. Performed at [facility], [address], [place], [place] [address]    DG Abd Portable 2 Views Result Date: 06/03/2024 CLINICAL DATA:  Chest pain and abdominal pain. EXAM: PORTABLE ABDOMEN - 2 VIEW COMPARISON:  None Available. FINDINGS: Stool in the ascending colon. No small bowel dilatation. No unexpected  radiopaque calculi. Visualized chest is grossly unremarkable. Partially imaged intramedullary rod and proximal interlocking screw in the proximal left femur. IMPRESSION: No acute findings. Electronically Signed   By: Newell Eke M.D.   On: 06/03/2024 13:12   DG Chest 2 View Result Date: 06/03/2024 CLINICAL DATA:  Chest pain and abdominal pain. EXAM: CHEST - 2 VIEW COMPARISON:  05/30/2024. FINDINGS: Trachea is midline. Heart size normal. Lungs are clear. No pleural fluid. IMPRESSION: Negative. Electronically Signed   By: Newell Eke M.D.   On: 06/03/2024 11:52    Pending Labs Unresulted Labs (From admission, onward)     Start     Ordered   06/03/24 1120  Urinalysis, Routine w reflex microscopic -Urine, Clean Catch  Once,   URGENT       Question:  Specimen Source  Answer:  Urine, Clean Catch   06/03/24 1119            Vitals/Pain Today's Vitals   06/03/24 1301 06/03/24 1333 06/03/24 1400 06/03/24 1415  BP:   (!) 160/89   Pulse:   72   Resp:   15   Temp:      TempSrc:      SpO2:   98%   Weight:      Height:      PainSc: 8  9   8      Isolation Precautions No  active isolations  Medications Medications  ondansetron  (ZOFRAN ) injection 4 mg (4 mg Intravenous Given 06/03/24 1152)  promethazine  (PHENERGAN ) 25 mg in sodium chloride  0.9 % 50 mL IVPB (0 mg Intravenous Stopped 06/03/24 1224)  sodium chloride  0.9 % bolus 1,000 mL (0 mLs Intravenous Stopped 06/03/24 1250)  pantoprazole  (PROTONIX ) injection 40 mg (40 mg Intravenous Given 06/03/24 1225)  HYDROmorphone  (DILAUDID ) injection 1 mg (1 mg Intravenous Given 06/03/24 1225)  sodium chloride  0.9 % bolus 500 mL (0 mLs Intravenous Stopped 06/03/24 1334)  prochlorperazine  (COMPAZINE ) injection 10 mg (10 mg Intravenous Given 06/03/24 1333)  HYDROmorphone  (DILAUDID ) injection 1 mg (1 mg Intravenous Given 06/03/24 1333)    Mobility walks     Focused Assessments Cardiac Assessment Handoff:  Cardiac Rhythm: Normal sinus rhythm Lab  Results  Component Value Date   CKTOTAL 146 03/19/2010   CKMB 1.2 03/19/2010   TROPONINI <0.01        NO INDICATION OF MYOCARDIAL INJURY. 03/19/2010   No results found for: DDIMER Does the Patient currently have chest pain? No    R Recommendations: See Admitting Provider Note  Report given to:   Additional Notes:

## 2024-06-03 NOTE — ED Triage Notes (Signed)
 Pt recently d/c from hospital and states she was here yesterday but chest pain and abd pain has come back full force.

## 2024-06-04 DIAGNOSIS — F12188 Cannabis abuse with other cannabis-induced disorder: Secondary | ICD-10-CM | POA: Diagnosis not present

## 2024-06-04 DIAGNOSIS — R1110 Vomiting, unspecified: Secondary | ICD-10-CM | POA: Diagnosis not present

## 2024-06-04 DIAGNOSIS — R1115 Cyclical vomiting syndrome unrelated to migraine: Secondary | ICD-10-CM | POA: Diagnosis not present

## 2024-06-04 LAB — COMPREHENSIVE METABOLIC PANEL WITH GFR
ALT: 22 U/L (ref 0–44)
AST: 14 U/L — ABNORMAL LOW (ref 15–41)
Albumin: 3.3 g/dL — ABNORMAL LOW (ref 3.5–5.0)
Alkaline Phosphatase: 28 U/L — ABNORMAL LOW (ref 38–126)
Anion gap: 9 (ref 5–15)
BUN: <5 mg/dL — ABNORMAL LOW (ref 6–20)
CO2: 25 mmol/L (ref 22–32)
Calcium: 7.9 mg/dL — ABNORMAL LOW (ref 8.9–10.3)
Chloride: 99 mmol/L (ref 98–111)
Creatinine, Ser: 0.54 mg/dL (ref 0.44–1.00)
GFR, Estimated: >60 mL/min (ref >60)
Glucose, Bld: 95 mg/dL (ref 70–99)
Potassium: 3.5 mmol/L (ref 3.5–5.1)
Sodium: 133 mmol/L — ABNORMAL LOW (ref 135–145)
Total Bilirubin: 1.2 mg/dL (ref 0.0–1.2)
Total Protein: 5.6 g/dL — ABNORMAL LOW (ref 6.5–8.1)

## 2024-06-04 LAB — CBC
HCT: 35.8 % — ABNORMAL LOW (ref 36.0–46.0)
Hemoglobin: 12.6 g/dL (ref 12.0–15.0)
MCH: 32.6 pg (ref 26.0–34.0)
MCHC: 35.2 g/dL (ref 30.0–36.0)
MCV: 92.7 fL (ref 80.0–100.0)
Platelets: 195 K/uL (ref 150–400)
RBC: 3.86 MIL/uL — ABNORMAL LOW (ref 3.87–5.11)
RDW: 11.3 % — ABNORMAL LOW (ref 11.5–15.5)
WBC: 11.9 K/uL — ABNORMAL HIGH (ref 4.0–10.5)
nRBC: 0 % (ref 0.0–0.2)

## 2024-06-04 MED ORDER — LORAZEPAM 0.5 MG PO TABS
0.5000 mg | ORAL_TABLET | Freq: Every day | ORAL | Status: DC
  Administered 2024-06-04 – 2024-06-06 (×5): 0.5 mg via ORAL
  Filled 2024-06-04 (×3): qty 1

## 2024-06-04 MED ORDER — APREPITANT 40 MG PO CAPS
40.0000 mg | ORAL_CAPSULE | Freq: Once | ORAL | Status: AC
  Administered 2024-06-05 (×2): 40 mg via ORAL
  Filled 2024-06-04 (×2): qty 1

## 2024-06-04 MED ORDER — METOCLOPRAMIDE HCL 5 MG/ML IJ SOLN
5.0000 mg | Freq: Three times a day (TID) | INTRAMUSCULAR | Status: DC
  Administered 2024-06-04 – 2024-06-06 (×12): 5 mg via INTRAVENOUS
  Filled 2024-06-04 (×9): qty 2

## 2024-06-04 MED ORDER — LORAZEPAM 2 MG/ML IJ SOLN: 0.5000 mg | Freq: Every day | INTRAMUSCULAR | Status: DC

## 2024-06-04 NOTE — TOC Initial Note (Signed)
 Transition of Care [facility]) - Initial/Assessment Note   Patient Details  Name: [name] MRN: [id number] Date of Birth: [birth year]  [facility]) CM/SW Contact:    Nena LITTIE Coffee, RN Phone Number: 06/04/2024, 4:51 PM  Clinical Narrative:                 Pt assessed due to high readmission score. UDS positive for opiates and THC. Admitted c/intractable vomiting, cyclic vomiting syndrome, cannabis hyperemesis syndrome.   Expected Discharge Plan: Home/Self Care Barriers to Discharge: Continued Medical Work up  Patient Goals and CMS Choice Patient states their goals for this hospitalization and ongoing recovery are:: Return home, feel better.         Expected Discharge Plan and Services In-house Referral: Clinical Social Work Discharge Planning Services: CM Consult                                          Prior Living Arrangements/Services   Lives with:: Spouse Patient language and need for interpreter reviewed:: Yes        Need for Family Participation in Patient Care: Yes (Comment) Care giver support system in place?: Yes (comment)   Criminal Activity/Legal Involvement Pertinent to Current Situation/Hospitalization: No - Comment as needed  Activities of Daily Living   ADL Screening (condition at time of admission) Independently performs ADLs?: Yes (appropriate for developmental age) Is the patient deaf or have difficulty hearing?: No Does the patient have difficulty seeing, even when wearing glasses/contacts?: No Does the patient have difficulty concentrating, remembering, or making decisions?: No  Permission Sought/Granted                  Emotional Assessment   Attitude/Demeanor/Rapport: Engaged Affect (typically observed): Appropriate Orientation: : Oriented to Self, Oriented to Place, Oriented to  Time, Oriented to Situation Alcohol / Substance Use: Other (comment) (Hx of cannabis use) Psych Involvement: No  (comment)  Admission diagnosis:  Intractable vomiting [R11.10] Pain of upper abdomen [R10.10] Nausea and vomiting, unspecified vomiting type [R11.2] Patient Active Problem List   Diagnosis Date Noted   Intractable vomiting 06/03/2024   Cyclic vomiting syndrome 06/03/2024   Cannabis hyperemesis syndrome concurrent with and due to cannabis abuse (HCC) 06/03/2024   Leukocytosis 05/30/2024   Chronic erosive gastritis 05/30/2024   Hypokalemia 09/04/2023   Intractable nausea and vomiting 09/02/2023   Migraine 09/02/2023   Chest pain 09/02/2023   Raynaud's phenomenon 05/13/2021   Bilateral hand pain 04/22/2021   Bilateral foot pain 04/22/2021   Rheumatoid factor positive 04/22/2021   Postop check 01/26/2018   Stricture and stenosis of cervix uteri 08/13/2015   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   IUD check up 06/27/2014   Chronic pain 04/04/2014   Chronic anxiety 04/04/2014   Hypertension 04/04/2014   Menorrhagia with regular cycle 03/21/2014   Dyspareunia 03/21/2014   Nausea and vomiting 01/26/2014   Impingement syndrome of shoulder region 12/21/2012   Pain in joint, shoulder region 12/21/2012   Elbow pain 07/02/2011   Sprain of acromioclavicular ligament 06/10/2011   Pain in joint, upper arm 06/10/2011   Muscle weakness (generalized) 06/10/2011   Tennis elbow 04/23/2011   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 [place] [address] Phone: [phone] Fax: [phone]     Social Drivers of Health (SDOH) Social History: SDOH Screenings   Food Insecurity: No Food Insecurity (06/03/2024)  Housing: Low Risk  (06/03/2024)  Transportation Needs: No Transportation Needs (06/03/2024)  Utilities: Not At Risk (06/03/2024)  Alcohol Screen: Low Risk  (08/04/2023)   Depression (PHQ2-9): Low Risk  (08/04/2023)  Financial Resource Strain: Low Risk  (08/04/2023)  Physical Activity: Insufficiently Active (08/04/2023)  Social Connections: Moderately Integrated (08/04/2023)  Stress: No Stress Concern Present (08/04/2023)  Tobacco Use: Medium Risk (06/03/2024)   SDOH Interventions:     Readmission Risk Interventions    06/04/2024    4:44 PM 05/31/2024    9:21 AM  Readmission Risk Prevention Plan  Medication Screening  Complete  Transportation Screening Complete Complete  PCP or Specialist Appt within 3-5 Days Complete   HRI or Home Care Consult Complete   Social Work Consult for Recovery Care Planning/Counseling Complete   Palliative Care Screening Not Applicable   Medication Review Oceanographer) Complete

## 2024-06-04 NOTE — Consult Note (Addendum)
 Referring Provider: No ref. provider found Primary Care Physician:  [facility] Primary Gastroenterologist:    Sampson  Reason for Consultation:   recurrent nausea and vomiting.  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  Just admitted within the last week for 3 days with nausea and vomiting.  Treated symptomatically with gradual improvement.  She attempted to advance her diet and developed recurrent nausea and vomiting in spite of taking Carafate  and pantoprazole  twice daily at home.  Seen again in the ED 8/8.  Treat symptomatically.  Was discharged.  Recurrent symptoms precipitated hospitalization yesterday.  Had any evidence of GI bleeding.  EGD   09/03/2023 demonstrated only mild gastritis.  H. pylori negative by histology.  Gallbladder previously removed. Uses cannabis including edibles on a regular basis.  Graph history of migraine headaches previously well-controlled on Nurtec until insurance would no longer provide a benefit.  States that nausea vomiting symptoms were not really a problem on Nurtec.  Headaches were well-controlled as well.  She does not use alcohol or other illicit drugs than that stated above. This admission white count 11.9 hemoglobin 12.6.  LFTs are not elevated. Past Medical History:  Diagnosis Date   Acid reflux    Anxiety    Arthritis    Environmental allergies    Gastric ulcer    Gastritis    Hypertension    Migraine     Past Surgical History:  Procedure Laterality Date   arm surgery Left    plate in arm   BILATERAL SALPINGECTOMY     BIOPSY  09/03/2023   Procedure: BIOPSY;  Surgeon: Eartha Angelia Sieving, MD;  Location: [facility];  Service: Gastroenterology;;   BONE BIOPSY Right    thumb   BRAIN SURGERY     evacuation of hematoma   CESAREAN  SECTION     x2   CHOLECYSTECTOMY     ECTOPIC PREGNANCY SURGERY     x2   ENDOMETRIAL ABLATION  01/18/2018   Procedure: ENDOMETRIAL ABLATION WITH MINERVA;  Surgeon: Edsel Norleen GAILS, MD;  Location: [facility];  Service: Gynecology;;   ESOPHAGOGASTRODUODENOSCOPY (EGD) WITH PROPOFOL  N/A 01/30/2014   Procedure: ESOPHAGOGASTRODUODENOSCOPY (EGD) WITH PROPOFOL ;  Surgeon: Margo LITTIE Haddock, MD;  Location: [facility];  Service: Endoscopy;  Laterality: N/A;   ESOPHAGOGASTRODUODENOSCOPY (EGD) WITH PROPOFOL  N/A 09/03/2023   Procedure: ESOPHAGOGASTRODUODENOSCOPY (EGD) WITH PROPOFOL ;  Surgeon: Eartha Angelia Sieving, MD;  Location: [facility];  Service: Gastroenterology;  Laterality: N/A;   HYSTEROSCOPY WITH D & C N/A 08/13/2015   Procedure: DILATATION AND CURETTAGE /HYSTEROSCOPY;  Surgeon: Norleen Edsel GAILS, MD;  Location: [facility];  Service: Gynecology;  Laterality: N/A;   HYSTEROSCOPY WITH D & C  01/18/2018   Procedure: CERVICAL DILATATION /HYSTEROSCOPY;  Surgeon: Edsel Norleen GAILS, MD;  Location: [facility];  Service: Gynecology;;   LEG SURGERY Left    femur rodding and removal of part of left femur   otif ankle Right     Prior to Admission medications   Medication Sig Start Date End Date Taking? Authorizing Provider  busPIRone  (BUSPAR ) 10 MG tablet Take 10 mg by mouth 3 (three) times daily.   Yes [provider]  gabapentin  (NEURONTIN ) 600 MG tablet Take 600 mg by mouth 4 (four) times daily. 04/17/24  Yes [provider]  HYDROcodone -acetaminophen  (NORCO) 10-325 MG tablet Take  1 tablet by mouth every 8 (eight) hours as needed for moderate pain (pain score 4-6).   Yes [provider]  Multiple Vitamins-Minerals (MULTIVITAMIN ADULT) CHEW Chew 2 each by mouth daily.   Yes [provider]  NURTEC 75 MG TBDP Take 1 tablet by mouth as needed (migraine). 08/26/23  Yes [provider]  ondansetron  (ZOFRAN -ODT) 8 MG disintegrating tablet Take 1 tablet (8 mg total) by mouth every 8  (eight) hours as needed. Patient taking differently: Take 8 mg by mouth every 8 (eight) hours as needed for nausea or vomiting. 09/04/23  Yes Ricky Fines, MD  pantoprazole  (PROTONIX ) 40 MG tablet Take 1 tablet (40 mg total) by mouth 2 (two) times daily. 09/04/23  Yes Ricky Fines, MD  promethazine  (PHENERGAN ) 25 MG suppository Place 1 suppository (25 mg total) rectally every 6 (six) hours as needed for nausea or vomiting. 06/02/24  Yes Suzette Pac, MD  sucralfate  (CARAFATE ) 1 g tablet Take 1 tablet (1 g total) by mouth in the morning, at noon, and at bedtime for 10 days. 06/01/24 06/11/24 Yes Darci Pore, MD  traZODone  (DESYREL ) 150 MG tablet Take 300 mg by mouth daily. 08/10/23  Yes [provider]    Current Facility-Administered Medications  Medication Dose Route Frequency Provider Last Rate Last Admin   0.9 % NaCl with KCl 20 mEq/ L  infusion   Intravenous Continuous Tat, David, MD 125 mL/hr at 06/04/24 0755 New Bag at 06/04/24 0755   acetaminophen  (TYLENOL ) tablet 650 mg  650 mg Oral Q6H PRN Tat, Alm, MD       Or   acetaminophen  (TYLENOL ) suppository 650 mg  650 mg Rectal Q6H PRN Tat, Alm, MD       busPIRone  (BUSPAR ) tablet 10 mg  10 mg Oral TID Evonnie Alm, MD   10 mg at 06/03/24 2116   enoxaparin  (LOVENOX ) injection 40 mg  40 mg Subcutaneous Q24H Tat, Alm, MD   40 mg at 06/03/24 2116   HYDROmorphone  (DILAUDID ) injection 0.5 mg  0.5 mg Intravenous Q3H PRN Tat, Alm, MD   0.5 mg at 06/04/24 9270   pantoprazole  (PROTONIX ) injection 40 mg  40 mg Intravenous Q12H Tat, David, MD   40 mg at 06/03/24 2115   prochlorperazine  (COMPAZINE ) injection 10 mg  10 mg Intravenous Q6H Tat, David, MD   10 mg at 06/04/24 0340   traZODone  (DESYREL ) tablet 300 mg  300 mg Oral QHS Tat, David, MD   300 mg at 06/03/24 2116    Allergies as of 06/03/2024 - Review Complete 06/03/2024  Allergen Reaction Noted   Orange fruit [citrus] Other (See Comments) 01/26/2014   Demerol Nausea Only  01/14/2011    Family History  Problem Relation Age of Onset   Mental illness Mother    Diabetes Mother    Obesity Mother    Heart disease Father    Obesity Sister    Lupus Paternal Aunt    Cancer Maternal Grandfather        GASTRIC   Lupus Paternal Grandmother    Diabetes Son        boarderline   Arthritis Other    Cancer Other    Diabetes Other     Social History   Socioeconomic History   Marital status: Married    Spouse name: Not on file   Number of children: Not on file   Years of education: Not on file   Highest education level: Not on file  Occupational History  Not on file  Tobacco Use   Smoking status: Former    Current packs/day: 0.00    Average packs/day: 1 pack/day for 20.0 years (20.0 ttl pk-yrs)    Types: Cigarettes    Start date: 12/02/1991    Quit date: 12/02/2011    Years since quitting: 12.5   Smokeless tobacco: Never  Vaping Use   Vaping status: Every Day  Substance and Sexual Activity   Alcohol use: No   Drug use: No   Sexual activity: Yes    Birth control/protection: Surgical  Other Topics Concern   Not on file  Social History Narrative   Not on file   Social Drivers of Health   Financial Resource Strain: Low Risk  (08/04/2023)   Overall Financial Resource Strain (CARDIA)    Difficulty of Paying Living Expenses: Not very hard  Food Insecurity: No Food Insecurity (06/03/2024)   Hunger Vital Sign    Worried About Running Out of Food in the Last Year: Never true    Ran Out of Food in the Last Year: Never true  Transportation Needs: No Transportation Needs (06/03/2024)   PRAPARE - Administrator, Civil Service (Medical): No    Lack of Transportation (Non-Medical): No  Physical Activity: Insufficiently Active (08/04/2023)   Exercise Vital Sign    Days of Exercise per Week: 3 days    Minutes of Exercise per Session: 30 min  Stress: No Stress Concern Present (08/04/2023)   [employer] of Occupational Health - Occupational  Stress Questionnaire    Feeling of Stress : Only a little  Social Connections: Moderately Integrated (08/04/2023)   Social Connection and Isolation Panel    Frequency of Communication with Friends and Family: Three times a week    Frequency of Social Gatherings with Friends and Family: Twice a week    Attends Religious Services: 1 to 4 times per year    Active Member of [employer] or Organizations: No    Attends Banker Meetings: Never    Marital Status: Married  Catering manager Violence: Not At Risk (06/03/2024)   Humiliation, Afraid, Rape, and Kick questionnaire    Fear of Current or Ex-Partner: No    Emotionally Abused: No    Physically Abused: No    Sexually Abused: No    Review of Systems:  As in history of present illness. Physical Exam: Vital signs in last 24 hours: Temp:  [98.2 F (36.8 C)-99.1 F (37.3 C)] 98.3 F (36.8 C) (08/10 0245) Pulse Rate:  [61-73] 70 (08/10 0245) Resp:  [12-19] 18 (08/10 0245) BP: (116-162)/(71-92) 156/81 (08/10 0245) SpO2:  [97 %-100 %] 99 % (08/10 0245) Weight:  [64.9 kg] 64.9 kg (08/09 1117) Last BM Date : 06/03/24 General:   Alert,  Well-developed, well-nourished, pleasant and cooperative in NAD Lungs:  Clear throughout to auscultation.   No wheezes, crackles, or rhonchi. No acute distress. Heart:  Regular rate and rhythm; no murmurs, clicks, rubs,  or gallops. Abdomen:  Soft, nontender and nondistended. No masses, hepatosplenomegaly or hernias noted. Normal bowel sounds, without guarding, and without rebound.    Intake/Output from previous day: 08/09 0701 - 08/10 0700 In: 712 [I.V.:212; IV Piggyback:500] Out: -  Intake/Output this shift: No intake/output data recorded.  Lab Results: Recent Labs    06/02/24 0349 06/03/24 1125 06/04/24 0512  WBC 9.5 8.7 11.9*  HGB 14.5 14.1 12.6  HCT 41.0 40.2 35.8*  PLT 189 217 195   BMET Recent Labs  06/02/24 0349 06/03/24 1125 06/04/24 0512  NA 136 136 133*  K 3.6 3.5 3.5   CL 98 99 99  CO2 23 26 25   GLUCOSE 113* 102* 95  BUN 5* 5* <5*  CREATININE 0.71 0.68 0.54  CALCIUM 8.7* 8.6* 7.9*   LFT Recent Labs    06/03/24 1220 06/04/24 0512  PROT 6.0* 5.6*  ALBUMIN 3.6 3.3*  AST 15 14*  ALT 29 22  ALKPHOS 30* 28*  BILITOT 0.9 1.2  BILIDIR 0.1  --   IBILI 0.8  --   Studies/Results: DG Abd Portable 2 Views Result Date: 06/03/2024 CLINICAL DATA:  Chest pain and abdominal pain. EXAM: PORTABLE ABDOMEN - 2 VIEW COMPARISON:  None Available. FINDINGS: Stool in the ascending colon. No small bowel dilatation. No unexpected radiopaque calculi. Visualized chest is grossly unremarkable. Partially imaged intramedullary rod and proximal interlocking screw in the proximal left femur. IMPRESSION: No acute findings. Electronically Signed   By: Newell Eke M.D.   On: 06/03/2024 13:12   DG Chest 2 View Result Date: 06/03/2024 CLINICAL DATA:  Chest pain and abdominal pain. EXAM: CHEST - 2 VIEW COMPARISON:  05/30/2024. FINDINGS: Trachea is midline. Heart size normal. Lungs are clear. No pleural fluid. IMPRESSION: Negative. Electronically Signed   By: Newell Eke M.D.   On: 06/03/2024 11:52   Impression:  Pleasant 43 year old lady admitted to the hospital to recurrent nausea and vomiting in the setting of cannabis use.  History of well-documented migraine headaches now poorly controlled since she no longer takes Nurtec.  Given workup thus far, this lady most likely has overlapping cyclical vomiting syndrome along with  Cannabinoid hyperemesis syndrome.  Lengthy discussion about the use of cannabis containing agents in this setting.  Likely counterproductive and not recommended.  Moreover, she may be experiencing exacerbation of nausea and vomiting in the setting of poorly controlled migraines which can run hand-in-hand.  She does not need an EGD at this time.  Recommendations:  - Avoid use of all cannabis products  - Absolutely important to get her back on a  preventative  migraine regimen this will help with recurrent nausea and vomiting.  - Reglan  10 mg AC and at bedtime IV (scheduled medication if QT interval okay).  Patient states she has taken this medication before without any problems).  - Single dose of a Aprepitant  40 mg orally [facility] does not stock we will get a dose from [facility] tomorrow).  Discussed with [name]  In pharmacy.  -   Continue PPI. dosing once daily should suffice.  -  Mild sedation over the next 24-48 hours with IV Ativan  -   Scheduled - titrated to effect.  -    We will reassess tomorrow.       Notice:  This dictation was prepared with Dragon dictation along with smaller phrase technology. Any transcriptional errors that result from this process are unintentional and may not be corrected upon review.

## 2024-06-04 NOTE — Progress Notes (Signed)
 PROGRESS NOTE  [name] FMW:[id number] DOB: 04-08-1981 DOA: 06/03/2024 PCP: [facility]  Brief History:  43 year old female with a history of anxiety, hypertension, erosive gastritis, migraine headache, chronic back pain with opioid dependence presents with abdominal pain, chest pain, and nausea and vomiting.  Notably, the patient was recently hospitalized from 05/30/2024 to 06/01/2024 secondary to abdominal pain with intractable nausea and vomiting.  She was treated symptomatically.  She gradually improved, but states that her abdominal pain never really goes entirely away.  However she did tolerate her diet at the time of discharge.  She was discharged home with Carafate  and pantoprazole  twice daily. Unfortunately, she returned to the hospital the next day on 06/02/24.  She was treated symptomatically in the emergency department with antiemetics and opioids.  She was discharged home in stable condition. She returned once again on the morning of 06/03/2024 with the above symptoms.  She states she did have some hematemesis on the morning of 06/03/2024.  Her last bowel movement was on the morning of 06/03/2024.  There is no hematochezia or melena.  She denies any worsening shortness of breath, cough, hemoptysis.  She had low-grade temperature of 99.8 F.  She denies any rashes or synovitis.  She denies any dysuria or hematuria.  She denies any palpitations, shortness breath, dizziness, syncope.  She has had worsening generalized weakness.  Notably, the patient had similar presentation in November 2024.  She underwent EGD 09/03/2023 which showed erosive gastritis with stigmata of recent bleeding.  In the ED, the patient was afebrile hemodynamically stable with oxygen saturation 99% room air.  WBC 8.7, hemoglobin 14.1, platelet 217.  Sodium 136, potassium 3.5, bicarbonate 26, serum creatinine 0.68.  LFTs unremarkable.  Lipase 31.  EKG showed sinus rhythm without any acute ST  changes.  Troponin 3.  06/02/2024 troponin 4>> 4. In the ED, the patient was given hydromorphone  1 mg x 2, pantoprazole , Compazine , and Phenergan .  GI was consulted to assist with management.   Assessment/Plan:  Cyclic vomiting syndrome -Likely incited from the patient's cannabis use - Patient gives a history of using CBD Gummies regularly at bedtime for period of time - GI consult appreciated>>add aprepitant  - continue pantoprazole  - Clear liquid diet - Antiemetics around-the-clock - continue IVF - am cortisol--12.2 - TSH--0.292 - add reglan    Intractable abdominal pain -06/03/24 acute abd series--neg - 05/30/2024 CT abd/pelvis--negative for acute findings - Judicious opioids   Opioid dependence/chronic back pain - PDMP reviewed -Norco 10/325, #120, last refill 7/26 - Gabapentin  600 mg, #120, last refill 04/17/2024   Nicotine usage - Patient vapes regularly   Mood disorder - Continue trazodone  and buspirone          Family Communication:  no Family at bedside  Consultants:  GI  Code Status:  FULL   DVT Prophylaxis:  [place] Lovenox    Procedures: As Listed in Progress Note Above  Antibiotics: None       Subjective: Pt still having n/v.  Had one BM.  Denies f/,c   cp, sob, cough  Objective: Vitals:   06/03/24 1829 06/03/24 2245 06/04/24 0245 06/04/24 1219  BP: (!) 153/83 116/71 (!) 156/81 (!) 152/79  Pulse: 73 63 70 73  Resp: 18 17 18 17   Temp: 98.9 F (37.2 C) 98.2 F (36.8 C) 98.3 F (36.8 C) 98.4 F (36.9 C)  TempSrc: Oral Oral Oral Oral  SpO2: 98% 98% 99% 98%  Weight:  Height:        Intake/Output Summary (Last 24 hours) at 06/04/2024 1335 Last data filed at 06/03/2024 1706 Gross per 24 hour  Intake 212.01 ml  Output --  Net 212.01 ml   Weight change:  Exam:  General:  Pt is alert, follows commands appropriately, not in acute distress HEENT: No icterus, No thrush, No neck mass, [place]/AT Cardiovascular: RRR, S1/S2, no rubs, no  gallops Respiratory: CTA bilaterally, no wheezing, no crackles, no rhonchi Abdomen: Soft/+BS, mild diffuse tender, non distended, no guarding Extremities: No edema, No lymphangitis, No petechiae, No rashes, no synovitis   Data Reviewed: I have personally reviewed following labs and imaging studies Basic Metabolic Panel: Recent Labs  Lab 05/30/24 1024 05/31/24 0501 06/01/24 0454 06/02/24 0349 06/03/24 1125 06/04/24 0512  NA  --  139 134* 136 136 133*  K  --  3.3* 3.9 3.6 3.5 3.5  CL  --  105 104 98 99 99  CO2  --  22 25 23 26 25   GLUCOSE  --  118* 124* 113* 102* 95  BUN  --  8 <5* 5* 5* <5*  CREATININE  --  0.70 0.64 0.71 0.68 0.54  CALCIUM  --  8.4* 8.1* 8.7* 8.6* 7.9*  MG 1.9 1.6* 1.9 1.8  --   --    Liver Function Tests: Recent Labs  Lab 05/30/24 0851 06/02/24 0349 06/03/24 1220 06/04/24 0512  AST 21 27 15  14*  ALT 17 44 29 22  ALKPHOS 33* 34* 30* 28*  BILITOT 0.8 1.1 0.9 1.2  PROT 7.0 6.8 6.0* 5.6*  ALBUMIN 4.1 3.9 3.6 3.3*   Recent Labs  Lab 05/30/24 0851 06/02/24 0349 06/03/24 1125  LIPASE 26 27 31    No results for input(s): AMMONIA in the last 168 hours. Coagulation Profile: No results for input(s): INR, PROTIME in the last 168 hours. CBC: Recent Labs  Lab 05/30/24 0851 05/31/24 0501 06/01/24 0454 06/02/24 0349 06/03/24 1125 06/04/24 0512  WBC 13.3* 12.3* 10.1 9.5 8.7 11.9*  NEUTROABS 10.2* 10.4*  --  7.4  --   --   HGB 13.5 12.7 12.7 14.5 14.1 12.6  HCT 39.2 38.0 36.7 41.0 40.2 35.8*  MCV 94.0 95.5 93.6 92.3 93.1 92.7  PLT 220 195 169 189 217 195   Cardiac Enzymes: No results for input(s): CKTOTAL, CKMB, CKMBINDEX, TROPONINI in the last 168 hours. BNP: Invalid input(s): POCBNP CBG: No results for input(s): GLUCAP in the last 168 hours. HbA1C: No results for input(s): HGBA1C in the last 72 hours. Urine analysis:    Component Value Date/Time   COLORURINE STRAW (A) 06/03/2024 1300   APPEARANCEUR CLEAR 06/03/2024  1300   LABSPEC 1.010 06/03/2024 1300   PHURINE 7.0 06/03/2024 1300   GLUCOSEU NEGATIVE 06/03/2024 1300   HGBUR SMALL (A) 06/03/2024 1300   BILIRUBINUR NEGATIVE 06/03/2024 1300   KETONESUR 20 (A) 06/03/2024 1300   PROTEINUR NEGATIVE 06/03/2024 1300   UROBILINOGEN 0.2 08/09/2015 0846   NITRITE NEGATIVE 06/03/2024 1300   LEUKOCYTESUR NEGATIVE 06/03/2024 1300   Sepsis Labs: @LABRCNTIP (procalcitonin:4,lacticidven:4) )No results found for this or any previous visit (from the past 240 hours).   Scheduled Meds:  [START ON 06/05/2024] aprepitant   40 mg Oral Once   busPIRone   10 mg Oral TID   enoxaparin  (LOVENOX ) injection  40 mg Subcutaneous Q24H   LORazepam   0.5 mg Intravenous QHS   metoCLOPramide  (REGLAN ) injection  5 mg Intravenous TID AC & HS   pantoprazole  (PROTONIX ) IV  40 mg Intravenous  Q12H   prochlorperazine   10 mg Intravenous Q6H   traZODone   300 mg Oral QHS   Continuous Infusions:  0.9 % NaCl with KCl 20 mEq / L 125 mL/hr at 06/04/24 1018    Procedures/Studies: DG Abd Portable 2 Views Result Date: 06/03/2024 CLINICAL DATA:  Chest pain and abdominal pain. EXAM: PORTABLE ABDOMEN - 2 VIEW COMPARISON:  None Available. FINDINGS: Stool in the ascending colon. No small bowel dilatation. No unexpected radiopaque calculi. Visualized chest is grossly unremarkable. Partially imaged intramedullary rod and proximal interlocking screw in the proximal left femur. IMPRESSION: No acute findings. Electronically Signed   By: Newell Eke M.D.   On: 06/03/2024 13:12   DG Chest 2 View Result Date: 06/03/2024 CLINICAL DATA:  Chest pain and abdominal pain. EXAM: CHEST - 2 VIEW COMPARISON:  05/30/2024. FINDINGS: Trachea is midline. Heart size normal. Lungs are clear. No pleural fluid. IMPRESSION: Negative. Electronically Signed   By: Newell Eke M.D.   On: 06/03/2024 11:52   CT ABDOMEN PELVIS W CONTRAST Result Date: 05/30/2024 CLINICAL DATA:  Abdominal pain, acute, nonlocalized Vomiting for 24  hours with more recent onset of left-sided chest pain radiating into the left arm. EXAM: CT ABDOMEN AND PELVIS WITH CONTRAST TECHNIQUE: Multidetector CT imaging of the abdomen and pelvis was performed using the standard protocol following bolus administration of intravenous contrast. RADIATION DOSE REDUCTION: This exam was performed according to the departmental dose-optimization program which includes automated exposure control, adjustment of the mA and/or kV according to patient size and/or use of iterative reconstruction technique. CONTRAST:  OMNIPAQUE  IOHEXOL  300 MG/ML  SOLN COMPARISON:  Abdominopelvic CT 08/30/2023. FINDINGS: Lower chest: Stable small calcified granuloma medially in the right lower lobe. The lung bases are otherwise clear. No significant pleural or pericardial effusion. Hepatobiliary: The liver is normal in density without suspicious focal abnormality. No significant biliary dilatation status post cholecystectomy. Pancreas: Unremarkable. No pancreatic ductal dilatation or surrounding inflammatory changes. Spleen: Normal in size without focal abnormality. Adrenals/Urinary Tract: Both adrenal glands appear normal. No evidence of urinary tract calculus, suspicious renal lesion or hydronephrosis. The bladder appears unremarkable for its degree of distention. Stomach/Bowel: No enteric contrast administered. The stomach appears unremarkable for its degree of distension. No evidence of bowel wall thickening, distention or surrounding inflammatory change. The appendix appears normal. Vascular/Lymphatic: There are no enlarged abdominal or pelvic lymph nodes. Mild aortic atherosclerosis without evidence of aneurysm or large vessel occlusion. Reproductive: The uterus and ovaries appear unchanged. There are no suspicious adnexal findings. Stable small left ovarian vein varices. Other: No evidence of abdominal wall mass or hernia. No ascites or pneumoperitoneum. Musculoskeletal: No acute or  significant osseous findings. Mild facet arthropathy in the lower lumbar spine. Previous left femoral ORIF. IMPRESSION: 1. No acute findings or explanation for the patient's symptoms. 2. Previous cholecystectomy without biliary dilatation. 3. Stable small left ovarian vein varices. 4.  Aortic Atherosclerosis (ICD10-I70.0). Electronically Signed   By: Elsie Perone M.D.   On: 05/30/2024 11:22   DG Chest Port 1 View Result Date: 05/30/2024 CLINICAL DATA:  pain EXAM: PORTABLE CHEST - 1 VIEW COMPARISON:  September 02, 2023 FINDINGS: No focal airspace consolidation, pleural effusion, or pneumothorax. No cardiomegaly. No acute fracture or destructive lesion. IMPRESSION: No acute cardiopulmonary abnormality. Electronically Signed   By: Rogelia Myers M.D.   On: 05/30/2024 09:52    Alm Schneider, DO  [facility]  If 7PM-7AM, please contact night-coverage [email or web address] Password [email or web address] 06/04/2024, 1:35 PM   LOS: 1 day

## 2024-06-05 DIAGNOSIS — F12188 Cannabis abuse with other cannabis-induced disorder: Secondary | ICD-10-CM | POA: Diagnosis not present

## 2024-06-05 DIAGNOSIS — R1110 Vomiting, unspecified: Secondary | ICD-10-CM | POA: Diagnosis not present

## 2024-06-05 DIAGNOSIS — R1115 Cyclical vomiting syndrome unrelated to migraine: Secondary | ICD-10-CM | POA: Diagnosis not present

## 2024-06-05 LAB — BASIC METABOLIC PANEL WITH GFR
Anion gap: 8 (ref 5–15)
BUN: <5 mg/dL — ABNORMAL LOW (ref 6–20)
CO2: 23 mmol/L (ref 22–32)
Calcium: 8.1 mg/dL — ABNORMAL LOW (ref 8.9–10.3)
Chloride: 102 mmol/L (ref 98–111)
Creatinine, Ser: 0.52 mg/dL (ref 0.44–1.00)
GFR, Estimated: >60 mL/min (ref >60)
Glucose, Bld: 86 mg/dL (ref 70–99)
Potassium: 3.7 mmol/L (ref 3.5–5.1)
Sodium: 133 mmol/L — ABNORMAL LOW (ref 135–145)

## 2024-06-05 LAB — MAGNESIUM: Magnesium: 1.6 mg/dL — ABNORMAL LOW (ref 1.7–2.4)

## 2024-06-05 LAB — T4, FREE: Free T4: 1.34 ng/dL — ABNORMAL HIGH (ref 0.61–1.12)

## 2024-06-05 MED ORDER — AMITRIPTYLINE HCL 25 MG PO TABS
25.0000 mg | ORAL_TABLET | Freq: Every day | ORAL | Status: DC
  Administered 2024-06-05 – 2024-06-06 (×4): 25 mg via ORAL
  Filled 2024-06-05 (×2): qty 1

## 2024-06-05 MED ORDER — POTASSIUM CHLORIDE IN NACL 20-0.9 MEQ/L-% IV SOLN: INTRAVENOUS | Status: DC

## 2024-06-05 MED ORDER — MAGNESIUM SULFATE 2 GM/50ML IV SOLN
2.0000 g | Freq: Once | INTRAVENOUS | Status: AC
  Administered 2024-06-05 (×2): 2 g via INTRAVENOUS
  Filled 2024-06-05: qty 50

## 2024-06-05 NOTE — Progress Notes (Signed)
 Gastroenterology Progress Note    Primary Gastroenterologist:  Dr. Eartha  Patient ID: [name]; [id number]; 09-15-1981    Subjective   Continues to note feeling unwell. No vomiting. Feels nauseated. Abdominal pain underlying. Not able to confirm if worsened with eating, as she has had a poor appetite. Able to have small amount of broth yesterday evening after receiving Reglan . Has not eaten breakfast. Gallbladder absent. States she has had multiple deaths in the family over the past few weeks, including an animal that was like her baby. Feels this has worsened symptoms. +flatus. She denies feeling constipated. No diarrhea. Small bowel movement yesterday.   Received 2 doses of reglan  yesterday and third dose this morning prior to breakfast.    Objective   Vital signs in last 24 hours Temp:  [98.4 F (36.9 C)-98.6 F (37 C)] 98.6 F (37 C) (08/10 2049) Pulse Rate:  [60-73] 60 (08/10 2049) Resp:  [17-18] 18 (08/10 2049) BP: (152-160)/(79-85) 160/85 (08/10 2049) SpO2:  [98 %-99 %] 99 % (08/10 2049) Last BM Date : 06/03/24  Physical Exam General:   Alert and oriented, flat affect Head:  Normocephalic and atraumatic. Eyes:  No icterus, sclera clear. Conjuctiva pink.  Abdomen:  Bowel sounds present, soft, TTP LUQ, epigastric, above umbilicus, no rebound or guarding Neurologic:  Alert and  oriented x4   Intake/Output from previous day: 08/10 0701 - 08/11 0700 In: 240 [P.O.:240] Out: -  Intake/Output this shift: No intake/output data recorded.  Lab Results  Recent Labs    06/03/24 1125 06/04/24 0512  WBC 8.7 11.9*  HGB 14.1 12.6  HCT 40.2 35.8*  PLT 217 195   BMET Recent Labs    06/03/24 1125 06/04/24 0512 06/05/24 0450  NA 136 133* 133*  K 3.5 3.5 3.7  CL 99 99 102  CO2 26 25 23   GLUCOSE 102* 95 86  BUN 5* <5* <5*  CREATININE 0.68 0.54 0.52  CALCIUM 8.6* 7.9* 8.1*   LFT Recent Labs    06/03/24 1220 06/04/24 0512  PROT 6.0* 5.6*   ALBUMIN 3.6 3.3*  AST 15 14*  ALT 29 22  ALKPHOS 30* 28*  BILITOT 0.9 1.2  BILIDIR 0.1  --   IBILI 0.8  --      Studies/Results DG Abd Portable 2 Views Result Date: 06/03/2024 CLINICAL DATA:  Chest pain and abdominal pain. EXAM: PORTABLE ABDOMEN - 2 VIEW COMPARISON:  None Available. FINDINGS: Stool in the ascending colon. No small bowel dilatation. No unexpected radiopaque calculi. Visualized chest is grossly unremarkable. Partially imaged intramedullary rod and proximal interlocking screw in the proximal left femur. IMPRESSION: No acute findings. Electronically Signed   By: Newell Eke M.D.   On: 06/03/2024 13:12   DG Chest 2 View Result Date: 06/03/2024 CLINICAL DATA:  Chest pain and abdominal pain. EXAM: CHEST - 2 VIEW COMPARISON:  05/30/2024. FINDINGS: Trachea is midline. Heart size normal. Lungs are clear. No pleural fluid. IMPRESSION: Negative. Electronically Signed   By: Newell Eke M.D.   On: 06/03/2024 11:52   CT ABDOMEN PELVIS W CONTRAST Result Date: 05/30/2024 CLINICAL DATA:  Abdominal pain, acute, nonlocalized Vomiting for 24 hours with more recent onset of left-sided chest pain radiating into the left arm. EXAM: CT ABDOMEN AND PELVIS WITH CONTRAST TECHNIQUE: Multidetector CT imaging of the abdomen and pelvis was performed using the standard protocol following bolus administration of intravenous contrast. RADIATION DOSE REDUCTION: This exam was performed according to the departmental dose-optimization program which includes automated  exposure control, adjustment of the mA and/or kV according to patient size and/or use of iterative reconstruction technique. CONTRAST:  OMNIPAQUE  IOHEXOL  300 MG/ML  SOLN COMPARISON:  Abdominopelvic CT 08/30/2023. FINDINGS: Lower chest: Stable small calcified granuloma medially in the right lower lobe. The lung bases are otherwise clear. No significant pleural or pericardial effusion. Hepatobiliary: The liver is normal in density without  suspicious focal abnormality. No significant biliary dilatation status post cholecystectomy. Pancreas: Unremarkable. No pancreatic ductal dilatation or surrounding inflammatory changes. Spleen: Normal in size without focal abnormality. Adrenals/Urinary Tract: Both adrenal glands appear normal. No evidence of urinary tract calculus, suspicious renal lesion or hydronephrosis. The bladder appears unremarkable for its degree of distention. Stomach/Bowel: No enteric contrast administered. The stomach appears unremarkable for its degree of distension. No evidence of bowel wall thickening, distention or surrounding inflammatory change. The appendix appears normal. Vascular/Lymphatic: There are no enlarged abdominal or pelvic lymph nodes. Mild aortic atherosclerosis without evidence of aneurysm or large vessel occlusion. Reproductive: The uterus and ovaries appear unchanged. There are no suspicious adnexal findings. Stable small left ovarian vein varices. Other: No evidence of abdominal wall mass or hernia. No ascites or pneumoperitoneum. Musculoskeletal: No acute or significant osseous findings. Mild facet arthropathy in the lower lumbar spine. Previous left femoral ORIF. IMPRESSION: 1. No acute findings or explanation for the patient's symptoms. 2. Previous cholecystectomy without biliary dilatation. 3. Stable small left ovarian vein varices. 4.  Aortic Atherosclerosis (ICD10-I70.0). Electronically Signed   By: Elsie Perone M.D.   On: 05/30/2024 11:22   DG Chest Port 1 View Result Date: 05/30/2024 CLINICAL DATA:  pain EXAM: PORTABLE CHEST - 1 VIEW COMPARISON:  September 02, 2023 FINDINGS: No focal airspace consolidation, pleural effusion, or pneumothorax. No cardiomegaly. No acute fracture or destructive lesion. IMPRESSION: No acute cardiopulmonary abnormality. Electronically Signed   By: Rogelia Myers M.D.   On: 05/30/2024 09:52    Assessment  43 y.o. female with a history of migraines, gastritis,  anxiety/depression, chronic back pain on opioids, admitted with recurrent nausea vomiting again on 8/9, recently inpatient 8/5-8/7 and presented to the ED again on 8/8, then presented again on 8/9 with persistent symptoms.    Abdominal pain with N/V: prior EGD in Nov 2024 with erosive gastropathy, CTx X 3 on file since Aug 2024 with most recent on 8/5 unrevealing. Gallbladder absent, no bump in LFTs or lipase, troponins negative. Notes psychosocial stressors seemed to coincide with recent hospitalizations. Suspect recurrent symptoms multifactorial with cyclical vomiting syndrome and cannabinoid use that could be contrtibuting to hyperemsis syndrome. CTA had been brought up for consideration after last EGD if persistent symptoms. Weight appears stable from Nov 2024. If worsening pain or no improvement, recommend CTA. Will also start amitriptyline  each evening.    Continue with Reglan  dosing, as she has only received 3 doses thus far. Hopefully, she will start to have improvement with supportive measures.     Plan / Recommendations  Amitriptyline  25 mg each evening, first dose this evening. As she is on trazodone  and buspar , recommend close monitoring for serotonin syndrome.  Follow EKG for QTc changes Continue PPI BID CTA if continued abdominal pain Continue Reglan  before meals    LOS: 2 days    06/05/2024, 8:32 AM  Therisa MICAEL Stager, PhD, ANP-BC [facility]

## 2024-06-05 NOTE — Plan of Care (Signed)
 ?  Problem: Coping: ?Goal: Level of anxiety will decrease ?Outcome: Progressing ?  ?Problem: Safety: ?Goal: Ability to remain free from injury will improve ?Outcome: Progressing ?  ?

## 2024-06-05 NOTE — Plan of Care (Signed)

## 2024-06-05 NOTE — Progress Notes (Signed)
 PROGRESS NOTE  Alyzah Pelly FMW:[id number] DOB: [birth year] DOA: 06/03/2024 PCP: [facility]  Brief History:  43 year old female with a history of anxiety, hypertension, erosive gastritis, migraine headache, chronic back pain with opioid dependence presents with abdominal pain, chest pain, and nausea and vomiting.  Notably, the patient was recently hospitalized from 05/30/2024 to 06/01/2024 secondary to abdominal pain with intractable nausea and vomiting.  She was treated symptomatically.  She gradually improved, but states that her abdominal pain never really goes entirely away.  However she did tolerate her diet at the time of discharge.  She was discharged home with Carafate  and pantoprazole  twice daily. Unfortunately, she returned to the hospital the next day on 06/02/24.  She was treated symptomatically in the emergency department with antiemetics and opioids.  She was discharged home in stable condition. She returned once again on the morning of 06/03/2024 with the above symptoms.  She states she did have some hematemesis on the morning of 06/03/2024.  Her last bowel movement was on the morning of 06/03/2024.  There is no hematochezia or melena.  She denies any worsening shortness of breath, cough, hemoptysis.  She had low-grade temperature of 99.8 F.  She denies any rashes or synovitis.  She denies any dysuria or hematuria.  She denies any palpitations, shortness breath, dizziness, syncope.  She has had worsening generalized weakness.  Notably, the patient had similar presentation in November 2024.  She underwent EGD 09/03/2023 which showed erosive gastritis with stigmata of recent bleeding.  In the ED, the patient was afebrile hemodynamically stable with oxygen saturation 99% room air.  WBC 8.7, hemoglobin 14.1, platelet 217.  Sodium 136, potassium 3.5, bicarbonate 26, serum creatinine 0.68.  LFTs unremarkable.  Lipase 31.  EKG showed sinus rhythm without any acute ST  changes.  Troponin 3.  06/02/2024 troponin 4>> 4. In the ED, the patient was given hydromorphone  1 mg x 2, pantoprazole , Compazine , and Phenergan .  GI was consulted to assist with management.   Assessment/Plan:  Cyclic vomiting syndrome -Likely incited from the patient's cannabis use - Patient gives a history of using CBD Gummies regularly at bedtime for period of time - GI consult appreciated>>given aprepitant  8/11, now elavil  - continue pantoprazole  - Clear liquid diet>>full liquids - Antiemetics around-the-clock - continue IVF - am cortisol--12.2 - TSH--0.292 - continue reglan  -8/11--slowly improving;  no emesis today   Intractable abdominal pain -06/03/24 acute abd series--neg - 05/30/2024 CT abd/pelvis--negative for acute findings - Judicious opioids   Opioid dependence/chronic back pain - PDMP reviewed -Norco 10/325, #120, last refill 7/26 - Gabapentin  600 mg, #120, last refill 04/17/2024   Nicotine usage - Patient vapes regularly   Mood disorder - Continue trazodone  and buspirone                Family Communication:  spouse at bedside 8/11   Consultants:  GI   Code Status:  FULL    DVT Prophylaxis:  [place] Lovenox      Procedures: As Listed in Progress Note Above   Antibiotics: None        Subjective: Still nauseous, but emesis is improving.  Denies f/c, cp, sob, headache  Objective: Vitals:   06/03/24 2245 06/04/24 0245 06/04/24 1219 06/04/24 2049  BP: 116/71 (!) 156/81 (!) 152/79 (!) 160/85  Pulse: 63 70 73 60  Resp: 17 18 17 18   Temp: 98.2 F (36.8 C) 98.3 F (36.8 C) 98.4 F (36.9 C) 98.6 F (  37 C)  TempSrc: Oral Oral Oral Oral  SpO2: 98% 99% 98% 99%  Weight:      Height:        Intake/Output Summary (Last 24 hours) at 06/05/2024 1739 Last data filed at 06/05/2024 1334 Gross per 24 hour  Intake 240 ml  Output --  Net 240 ml   Weight change:  Exam:  General:  Pt is alert, follows commands appropriately, not in acute distress HEENT:  No icterus, No thrush, No neck mass, [place]/AT Cardiovascular: RRR, S1/S2, no rubs, no gallops Respiratory: CTA bilaterally, no wheezing, no crackles, no rhonchi Abdomen: Soft/+BS, non tender, non distended, no guarding Extremities: No edema, No lymphangitis, No petechiae, No rashes, no synovitis   Data Reviewed: I have personally reviewed following labs and imaging studies Basic Metabolic Panel: Recent Labs  Lab 05/30/24 1024 05/31/24 0501 06/01/24 0454 06/02/24 0349 06/03/24 1125 06/04/24 0512 06/05/24 0450  NA  --  139 134* 136 136 133* 133*  K  --  3.3* 3.9 3.6 3.5 3.5 3.7  CL  --  105 104 98 99 99 102  CO2  --  22 25 23 26 25 23   GLUCOSE  --  118* 124* 113* 102* 95 86  BUN  --  8 <5* 5* 5* <5* <5*  CREATININE  --  0.70 0.64 0.71 0.68 0.54 0.52  CALCIUM  --  8.4* 8.1* 8.7* 8.6* 7.9* 8.1*  MG 1.9 1.6* 1.9 1.8  --   --  1.6*   Liver Function Tests: Recent Labs  Lab 05/30/24 0851 06/02/24 0349 06/03/24 1220 06/04/24 0512  AST 21 27 15  14*  ALT 17 44 29 22  ALKPHOS 33* 34* 30* 28*  BILITOT 0.8 1.1 0.9 1.2  PROT 7.0 6.8 6.0* 5.6*  ALBUMIN 4.1 3.9 3.6 3.3*   Recent Labs  Lab 05/30/24 0851 06/02/24 0349 06/03/24 1125  LIPASE 26 27 31    No results for input(s): AMMONIA in the last 168 hours. Coagulation Profile: No results for input(s): INR, PROTIME in the last 168 hours. CBC: Recent Labs  Lab 05/30/24 0851 05/31/24 0501 06/01/24 0454 06/02/24 0349 06/03/24 1125 06/04/24 0512  WBC 13.3* 12.3* 10.1 9.5 8.7 11.9*  NEUTROABS 10.2* 10.4*  --  7.4  --   --   HGB 13.5 12.7 12.7 14.5 14.1 12.6  HCT 39.2 38.0 36.7 41.0 40.2 35.8*  MCV 94.0 95.5 93.6 92.3 93.1 92.7  PLT 220 195 169 189 217 195   Cardiac Enzymes: No results for input(s): CKTOTAL, CKMB, CKMBINDEX, TROPONINI in the last 168 hours. BNP: Invalid input(s): POCBNP CBG: No results for input(s): GLUCAP in the last 168 hours. HbA1C: No results for input(s): HGBA1C in the last 72  hours. Urine analysis:    Component Value Date/Time   COLORURINE STRAW (A) 06/03/2024 1300   APPEARANCEUR CLEAR 06/03/2024 1300   LABSPEC 1.010 06/03/2024 1300   PHURINE 7.0 06/03/2024 1300   GLUCOSEU NEGATIVE 06/03/2024 1300   HGBUR SMALL (A) 06/03/2024 1300   BILIRUBINUR NEGATIVE 06/03/2024 1300   KETONESUR 20 (A) 06/03/2024 1300   PROTEINUR NEGATIVE 06/03/2024 1300   UROBILINOGEN 0.2 08/09/2015 0846   NITRITE NEGATIVE 06/03/2024 1300   LEUKOCYTESUR NEGATIVE 06/03/2024 1300   Sepsis Labs: @LABRCNTIP (procalcitonin:4,lacticidven:4) )No results found for this or any previous visit (from the past 240 hours).   Scheduled Meds:  amitriptyline   25 mg Oral QHS   busPIRone   10 mg Oral TID   enoxaparin  (LOVENOX ) injection  40 mg Subcutaneous Q24H  LORazepam   0.5 mg Oral QHS   metoCLOPramide  (REGLAN ) injection  5 mg Intravenous TID AC & HS   pantoprazole  (PROTONIX ) IV  40 mg Intravenous Q12H   prochlorperazine   10 mg Intravenous Q6H   traZODone   300 mg Oral QHS   Continuous Infusions:  Procedures/Studies: DG Abd Portable 2 Views Result Date: 06/03/2024 CLINICAL DATA:  Chest pain and abdominal pain. EXAM: PORTABLE ABDOMEN - 2 VIEW COMPARISON:  None Available. FINDINGS: Stool in the ascending colon. No small bowel dilatation. No unexpected radiopaque calculi. Visualized chest is grossly unremarkable. Partially imaged intramedullary rod and proximal interlocking screw in the proximal left femur. IMPRESSION: No acute findings. Electronically Signed   By: Newell Eke M.D.   On: 06/03/2024 13:12   DG Chest 2 View Result Date: 06/03/2024 CLINICAL DATA:  Chest pain and abdominal pain. EXAM: CHEST - 2 VIEW COMPARISON:  05/30/2024. FINDINGS: Trachea is midline. Heart size normal. Lungs are clear. No pleural fluid. IMPRESSION: Negative. Electronically Signed   By: Newell Eke M.D.   On: 06/03/2024 11:52   CT ABDOMEN PELVIS W CONTRAST Result Date: 05/30/2024 CLINICAL DATA:  Abdominal pain,  acute, nonlocalized Vomiting for 24 hours with more recent onset of left-sided chest pain radiating into the left arm. EXAM: CT ABDOMEN AND PELVIS WITH CONTRAST TECHNIQUE: Multidetector CT imaging of the abdomen and pelvis was performed using the standard protocol following bolus administration of intravenous contrast. RADIATION DOSE REDUCTION: This exam was performed according to the departmental dose-optimization program which includes automated exposure control, adjustment of the mA and/or kV according to patient size and/or use of iterative reconstruction technique. CONTRAST:  OMNIPAQUE  IOHEXOL  300 MG/ML  SOLN COMPARISON:  Abdominopelvic CT 08/30/2023. FINDINGS: Lower chest: Stable small calcified granuloma medially in the right lower lobe. The lung bases are otherwise clear. No significant pleural or pericardial effusion. Hepatobiliary: The liver is normal in density without suspicious focal abnormality. No significant biliary dilatation status post cholecystectomy. Pancreas: Unremarkable. No pancreatic ductal dilatation or surrounding inflammatory changes. Spleen: Normal in size without focal abnormality. Adrenals/Urinary Tract: Both adrenal glands appear normal. No evidence of urinary tract calculus, suspicious renal lesion or hydronephrosis. The bladder appears unremarkable for its degree of distention. Stomach/Bowel: No enteric contrast administered. The stomach appears unremarkable for its degree of distension. No evidence of bowel wall thickening, distention or surrounding inflammatory change. The appendix appears normal. Vascular/Lymphatic: There are no enlarged abdominal or pelvic lymph nodes. Mild aortic atherosclerosis without evidence of aneurysm or large vessel occlusion. Reproductive: The uterus and ovaries appear unchanged. There are no suspicious adnexal findings. Stable small left ovarian vein varices. Other: No evidence of abdominal wall mass or hernia. No ascites or pneumoperitoneum.  Musculoskeletal: No acute or significant osseous findings. Mild facet arthropathy in the lower lumbar spine. Previous left femoral ORIF. IMPRESSION: 1. No acute findings or explanation for the patient's symptoms. 2. Previous cholecystectomy without biliary dilatation. 3. Stable small left ovarian vein varices. 4.  Aortic Atherosclerosis (ICD10-I70.0). Electronically Signed   By: Elsie Perone M.D.   On: 05/30/2024 11:22   DG Chest Port 1 View Result Date: 05/30/2024 CLINICAL DATA:  pain EXAM: PORTABLE CHEST - 1 VIEW COMPARISON:  September 02, 2023 FINDINGS: No focal airspace consolidation, pleural effusion, or pneumothorax. No cardiomegaly. No acute fracture or destructive lesion. IMPRESSION: No acute cardiopulmonary abnormality. Electronically Signed   By: Rogelia Myers M.D.   On: 05/30/2024 09:52    Alm Schneider, DO  [facility]  If 7PM-7AM, please contact night-coverage  [email or web address] Password [facility] 06/05/2024, 5:39 PM   LOS: 2 days

## 2024-06-06 ENCOUNTER — Inpatient Hospital Stay (HOSPITAL_COMMUNITY)

## 2024-06-06 DIAGNOSIS — R1110 Vomiting, unspecified: Secondary | ICD-10-CM | POA: Diagnosis not present

## 2024-06-06 DIAGNOSIS — R109 Unspecified abdominal pain: Secondary | ICD-10-CM

## 2024-06-06 DIAGNOSIS — K219 Gastro-esophageal reflux disease without esophagitis: Secondary | ICD-10-CM

## 2024-06-06 DIAGNOSIS — F12188 Cannabis abuse with other cannabis-induced disorder: Secondary | ICD-10-CM | POA: Diagnosis not present

## 2024-06-06 LAB — BASIC METABOLIC PANEL WITH GFR
Anion gap: 9 (ref 5–15)
BUN: 5 mg/dL — ABNORMAL LOW (ref 6–20)
CO2: 22 mmol/L (ref 22–32)
Calcium: 8.1 mg/dL — ABNORMAL LOW (ref 8.9–10.3)
Chloride: 102 mmol/L (ref 98–111)
Creatinine, Ser: 0.56 mg/dL (ref 0.44–1.00)
GFR, Estimated: >60 mL/min (ref >60)
Glucose, Bld: 81 mg/dL (ref 70–99)
Potassium: 3.9 mmol/L (ref 3.5–5.1)
Sodium: 133 mmol/L — ABNORMAL LOW (ref 135–145)

## 2024-06-06 LAB — MAGNESIUM: Magnesium: 1.7 mg/dL (ref 1.7–2.4)

## 2024-06-06 MED ORDER — IOHEXOL 350 MG/ML SOLN
100.0000 mL | Freq: Once | INTRAVENOUS | Status: AC | PRN
  Administered 2024-06-06 (×2): 100 mL via INTRAVENOUS

## 2024-06-06 MED ORDER — MAGNESIUM SULFATE 2 GM/50ML IV SOLN
2.0000 g | Freq: Once | INTRAVENOUS | Status: AC
  Administered 2024-06-06 (×2): 2 g via INTRAVENOUS
  Filled 2024-06-06: qty 50

## 2024-06-06 NOTE — Progress Notes (Addendum)
 PROGRESS NOTE  Laurette Villescas FMW:[id number] DOB: Jul 02, 1981 DOA: 06/03/2024 PCP: [facility]  Brief History:  43 year old female with a history of anxiety, hypertension, erosive gastritis, migraine headache, chronic back pain with opioid dependence presents with abdominal pain, chest pain, and nausea and vomiting.  Notably, the patient was recently hospitalized from 05/30/2024 to 06/01/2024 secondary to abdominal pain with intractable nausea and vomiting.  She was treated symptomatically.  She gradually improved, but states that her abdominal pain never really goes entirely away.  However she did tolerate her diet at the time of discharge.  She was discharged home with Carafate  and pantoprazole  twice daily. Unfortunately, she returned to the hospital the next day on 06/02/24.  She was treated symptomatically in the emergency department with antiemetics and opioids.  She was discharged home in stable condition. She returned once again on the morning of 06/03/2024 with the above symptoms.  She states she did have some hematemesis on the morning of 06/03/2024.  Her last bowel movement was on the morning of 06/03/2024.  There is no hematochezia or melena.  She denies any worsening shortness of breath, cough, hemoptysis.  She had low-grade temperature of 99.8 F.  She denies any rashes or synovitis.  She denies any dysuria or hematuria.  She denies any palpitations, shortness breath, dizziness, syncope.  She has had worsening generalized weakness.  Notably, the patient had similar presentation in November 2024.  She underwent EGD 09/03/2023 which showed erosive gastritis with stigmata of recent bleeding.  In the ED, the patient was afebrile hemodynamically stable with oxygen saturation 99% room air.  WBC 8.7, hemoglobin 14.1, platelet 217.  Sodium 136, potassium 3.5, bicarbonate 26, serum creatinine 0.68.  LFTs unremarkable.  Lipase 31.  EKG showed sinus rhythm without any acute ST  changes.  Troponin 3.  06/02/2024 troponin 4>> 4. In the ED, the patient was given hydromorphone  1 mg x 2, pantoprazole , Compazine , and Phenergan .  GI was consulted to assist with management.   Assessment/Plan: Cyclic vomiting syndrome -Likely incited from the patient's cannabis use - Patient gives a history of using CBD Gummies regularly at bedtime for period of time - GI consult appreciated>>given aprepitant  8/11, now elavil  - continue pantoprazole  - Clear liquid diet>>full liquids - Antiemetics around-the-clock - continue IVF>>saline lock with diet advancement - am cortisol--12.2 - TSH--0.292 - continue reglan  -8/11--slowly improving;  no emesis today -06/06/24--full liquid diet, no emesis today -personally reviewed EKG--sinus, no STT changes, QTc = 469   Intractable abdominal pain -06/03/24 acute abd series--neg - 05/30/2024 CT abd/pelvis--negative for acute findings - Judicious opioids -06/06/24 CTA abd/pelvis--No evidence of mesenteric arterial occlusive disease    Opioid dependence/chronic back pain - PDMP reviewed -Norco 10/325, #120, last refill 7/26 - Gabapentin  600 mg, #120, last refill 04/17/2024   Nicotine usage - Patient vapes regularly   Mood disorder - Continue trazodone  and buspirone   Hypomagnesemia -replete  Hyponatremia -due to poor solute intake, volume depletion -stable  Abnormal thyroid  functions -currently suggest mild hyperthyroidism -TSH 0.292 -Free T4--1.34 -would repeat in 3-4 weeks when medically stable before initiating further workup/treatment               Family Communication:  spouse at bedside 8/11   Consultants:  GI   Code Status:  FULL    DVT Prophylaxis:  [place] Lovenox      Procedures: As Listed in Progress Note Above   Antibiotics: None  Subjective: Continues to feel better.  No emesis today. Denies f/c, cp, sob, vomiting, diarrhea.  Abd pain improving  Objective: Vitals:   06/04/24 2049 06/05/24 2033  06/06/24 0445 06/06/24 1351  BP: (!) 160/85 136/83 131/79 136/84  Pulse: 60 67 72   Resp: 18 20 20 20   Temp: 98.6 F (37 C) 98 F (36.7 C) 98.8 F (37.1 C)   TempSrc: Oral Oral Oral Oral  SpO2: 99% 97% 95% 96%  Weight:      Height:        Intake/Output Summary (Last 24 hours) at 06/06/2024 1609 Last data filed at 06/06/2024 1507 Gross per 24 hour  Intake 2485.26 ml  Output --  Net 2485.26 ml   Weight change:  Exam:  General:  Pt is alert, follows commands appropriately, not in acute distress HEENT: No icterus, No thrush, No neck mass, [place]/AT Cardiovascular: RRR, S1/S2, no rubs, no gallops Respiratory: CTA bilaterally, no wheezing, no crackles, no rhonchi Abdomen: Soft/+BS, non tender, non distended, no guarding Extremities: No edema, No lymphangitis, No petechiae, No rashes, no synovitis   Data Reviewed: I have personally reviewed following labs and imaging studies Basic Metabolic Panel: Recent Labs  Lab 05/31/24 0501 06/01/24 0454 06/02/24 0349 06/03/24 1125 06/04/24 0512 06/05/24 0450 06/06/24 0443  NA 139 134* 136 136 133* 133* 133*  K 3.3* 3.9 3.6 3.5 3.5 3.7 3.9  CL 105 104 98 99 99 102 102  CO2 22 25 23 26 25 23 22   GLUCOSE 118* 124* 113* 102* 95 86 81  BUN 8 <5* 5* 5* <5* <5* 5*  CREATININE 0.70 0.64 0.71 0.68 0.54 0.52 0.56  CALCIUM 8.4* 8.1* 8.7* 8.6* 7.9* 8.1* 8.1*  MG 1.6* 1.9 1.8  --   --  1.6* 1.7   Liver Function Tests: Recent Labs  Lab 06/02/24 0349 06/03/24 1220 06/04/24 0512  AST 27 15 14*  ALT 44 29 22  ALKPHOS 34* 30* 28*  BILITOT 1.1 0.9 1.2  PROT 6.8 6.0* 5.6*  ALBUMIN 3.9 3.6 3.3*   Recent Labs  Lab 06/02/24 0349 06/03/24 1125  LIPASE 27 31   No results for input(s): AMMONIA in the last 168 hours. Coagulation Profile: No results for input(s): INR, PROTIME in the last 168 hours. CBC: Recent Labs  Lab 05/31/24 0501 06/01/24 0454 06/02/24 0349 06/03/24 1125 06/04/24 0512  WBC 12.3* 10.1 9.5 8.7 11.9*  NEUTROABS  10.4*  --  7.4  --   --   HGB 12.7 12.7 14.5 14.1 12.6  HCT 38.0 36.7 41.0 40.2 35.8*  MCV 95.5 93.6 92.3 93.1 92.7  PLT 195 169 189 217 195   Cardiac Enzymes: No results for input(s): CKTOTAL, CKMB, CKMBINDEX, TROPONINI in the last 168 hours. BNP: Invalid input(s): POCBNP CBG: No results for input(s): GLUCAP in the last 168 hours. HbA1C: No results for input(s): HGBA1C in the last 72 hours. Urine analysis:    Component Value Date/Time   COLORURINE STRAW (A) 06/03/2024 1300   APPEARANCEUR CLEAR 06/03/2024 1300   LABSPEC 1.010 06/03/2024 1300   PHURINE 7.0 06/03/2024 1300   GLUCOSEU NEGATIVE 06/03/2024 1300   HGBUR SMALL (A) 06/03/2024 1300   BILIRUBINUR NEGATIVE 06/03/2024 1300   KETONESUR 20 (A) 06/03/2024 1300   PROTEINUR NEGATIVE 06/03/2024 1300   UROBILINOGEN 0.2 08/09/2015 0846   NITRITE NEGATIVE 06/03/2024 1300   LEUKOCYTESUR NEGATIVE 06/03/2024 1300   Sepsis Labs: @LABRCNTIP (procalcitonin:4,lacticidven:4) )No results found for this or any previous visit (from the past 240 hours).  Scheduled Meds:  amitriptyline   25 mg Oral QHS   busPIRone   10 mg Oral TID   enoxaparin  (LOVENOX ) injection  40 mg Subcutaneous Q24H   LORazepam   0.5 mg Oral QHS   metoCLOPramide  (REGLAN ) injection  5 mg Intravenous TID AC & HS   pantoprazole  (PROTONIX ) IV  40 mg Intravenous Q12H   prochlorperazine   10 mg Intravenous Q6H   traZODone   300 mg Oral QHS   Continuous Infusions:  Procedures/Studies: CT Angio Abd/Pel w/ and/or w/o Result Date: 06/06/2024 CLINICAL DATA:  Abdominal pain, nausea and vomiting. EXAM: CT ANGIOGRAPHY ABDOMEN AND PELVIS WITH CONTRAST AND WITHOUT CONTRAST TECHNIQUE: Multidetector CT imaging of the abdomen and pelvis was performed using the standard protocol during bolus administration of intravenous contrast. Multiplanar reconstructed images and MIPs were obtained and reviewed to evaluate the vascular anatomy. RADIATION DOSE REDUCTION: This exam was  performed according to the departmental dose-optimization program which includes automated exposure control, adjustment of the mA and/or kV according to patient size and/or use of iterative reconstruction technique. CONTRAST:  OMNIPAQUE  IOHEXOL  350 MG/ML SOLN COMPARISON:  CT of the abdomen and pelvis on 05/30/2024 FINDINGS: VASCULAR Aorta: Normal caliber abdominal aorta without aneurysm or dissection. Minimal calcified plaque in the distal aorta. Celiac: Normally patent. Normally patent branch vessels and branching anatomy. SMA: Normally patent. Renals: Normally patent bilateral single renal arteries. IMA: Normally patent. Inflow: Normally patent bilateral common, internal and external iliac arteries. Proximal Outflow: Normally patent common femoral arteries and femoral bifurcations. Veins: Venous phase imaging demonstrates normal patency of venous structures in the abdomen and pelvis including mesenteric veins, portal vein and splenic vein. IVC is normally positioned. Stable mildly dilated left pelvic veins that communicate with a mildly dilated left ovarian vein measuring up to 6 mm. Review of the MIP images confirms the above findings. NON-VASCULAR Lower chest: No acute abnormality. Hepatobiliary: No focal liver abnormality is seen. Status post cholecystectomy. No biliary dilatation. Pancreas: Unremarkable. No pancreatic ductal dilatation or surrounding inflammatory changes. Spleen: Normal in size without focal abnormality. Adrenals/Urinary Tract: Adrenal glands are unremarkable. Kidneys are normal, without renal calculi, focal lesion, or hydronephrosis. Bladder is unremarkable. Stomach/Bowel: Bowel shows no evidence of obstruction, ileus, inflammation or lesion. The appendix is not discretely visualized. No free intraperitoneal air. Lymphatic: No enlarged abdominal or pelvic lymph nodes. Reproductive: Uterus and bilateral adnexa are unremarkable. Cyst or adjacent cysts of the left ovary measure up to 2.6  cm in total diameter. Other: No abdominal wall hernia or abnormality. No abdominopelvic ascites. Musculoskeletal: No acute or significant osseous findings. IMPRESSION: 1. No acute findings in the abdomen or pelvis. 2. No evidence of mesenteric arterial occlusive disease with normally patent mesenteric visceral arteries. 3. Normally patent mesenteric veins and portal vein. 4. Stable mildly dilated left pelvic veins that communicate with a mildly dilated left ovarian vein measuring up to 6 mm. 5. Cyst or adjacent cysts of the left ovary measure up to 2.6 cm in total diameter. Electronically Signed   By: Marcey Moan M.D.   On: 06/06/2024 14:46   DG Abd Portable 2 Views Result Date: 06/03/2024 CLINICAL DATA:  Chest pain and abdominal pain. EXAM: PORTABLE ABDOMEN - 2 VIEW COMPARISON:  None Available. FINDINGS: Stool in the ascending colon. No small bowel dilatation. No unexpected radiopaque calculi. Visualized chest is grossly unremarkable. Partially imaged intramedullary rod and proximal interlocking screw in the proximal left femur. IMPRESSION: No acute findings. Electronically Signed   By: Newell Eke M.D.   On: 06/03/2024 13:12  DG Chest 2 View Result Date: 06/03/2024 CLINICAL DATA:  Chest pain and abdominal pain. EXAM: CHEST - 2 VIEW COMPARISON:  05/30/2024. FINDINGS: Trachea is midline. Heart size normal. Lungs are clear. No pleural fluid. IMPRESSION: Negative. Electronically Signed   By: Newell Eke M.D.   On: 06/03/2024 11:52   CT ABDOMEN PELVIS W CONTRAST Result Date: 05/30/2024 CLINICAL DATA:  Abdominal pain, acute, nonlocalized Vomiting for 24 hours with more recent onset of left-sided chest pain radiating into the left arm. EXAM: CT ABDOMEN AND PELVIS WITH CONTRAST TECHNIQUE: Multidetector CT imaging of the abdomen and pelvis was performed using the standard protocol following bolus administration of intravenous contrast. RADIATION DOSE REDUCTION: This exam was performed according to the  departmental dose-optimization program which includes automated exposure control, adjustment of the mA and/or kV according to patient size and/or use of iterative reconstruction technique. CONTRAST:  OMNIPAQUE  IOHEXOL  300 MG/ML  SOLN COMPARISON:  Abdominopelvic CT 08/30/2023. FINDINGS: Lower chest: Stable small calcified granuloma medially in the right lower lobe. The lung bases are otherwise clear. No significant pleural or pericardial effusion. Hepatobiliary: The liver is normal in density without suspicious focal abnormality. No significant biliary dilatation status post cholecystectomy. Pancreas: Unremarkable. No pancreatic ductal dilatation or surrounding inflammatory changes. Spleen: Normal in size without focal abnormality. Adrenals/Urinary Tract: Both adrenal glands appear normal. No evidence of urinary tract calculus, suspicious renal lesion or hydronephrosis. The bladder appears unremarkable for its degree of distention. Stomach/Bowel: No enteric contrast administered. The stomach appears unremarkable for its degree of distension. No evidence of bowel wall thickening, distention or surrounding inflammatory change. The appendix appears normal. Vascular/Lymphatic: There are no enlarged abdominal or pelvic lymph nodes. Mild aortic atherosclerosis without evidence of aneurysm or large vessel occlusion. Reproductive: The uterus and ovaries appear unchanged. There are no suspicious adnexal findings. Stable small left ovarian vein varices. Other: No evidence of abdominal wall mass or hernia. No ascites or pneumoperitoneum. Musculoskeletal: No acute or significant osseous findings. Mild facet arthropathy in the lower lumbar spine. Previous left femoral ORIF. IMPRESSION: 1. No acute findings or explanation for the patient's symptoms. 2. Previous cholecystectomy without biliary dilatation. 3. Stable small left ovarian vein varices. 4.  Aortic Atherosclerosis (ICD10-I70.0). Electronically Signed   By: Elsie Perone M.D.   On: 05/30/2024 11:22   DG Chest Port 1 View Result Date: 05/30/2024 CLINICAL DATA:  pain EXAM: PORTABLE CHEST - 1 VIEW COMPARISON:  September 02, 2023 FINDINGS: No focal airspace consolidation, pleural effusion, or pneumothorax. No cardiomegaly. No acute fracture or destructive lesion. IMPRESSION: No acute cardiopulmonary abnormality. Electronically Signed   By: Rogelia Myers M.D.   On: 05/30/2024 09:52    Alm Schneider, DO  [facility]  If 7PM-7AM, please contact night-coverage [email or web address] Password [email or web address] 06/06/2024, 4:09 PM   LOS: 3 days

## 2024-06-06 NOTE — Progress Notes (Addendum)
 Subjective: Reports she is feeling a little better. Had grit and ice cream this morning and tolerated it well. No nausea or vomiting so far. Yesterday had vomiting early in the morning, but did ok the rest of the day. Had a bowl of broth yesterday. Abdominal pain continues but states it isn't as bad as it was.   Had 2-3 Bms yesterday. Soft. No brbpr or melena.     Objective: Vital signs in last 24 hours: Temp:  [98 F (36.7 C)-98.8 F (37.1 C)] 98.8 F (37.1 C) (08/12 0445) Pulse Rate:  [67-72] 72 (08/12 0445) Resp:  [20] 20 (08/12 0445) BP: (131-136)/(79-83) 131/79 (08/12 0445) SpO2:  [95 %-97 %] 95 % (08/12 0445) Last BM Date : 06/04/24 General:   Alert and oriented, pleasant, NAD.  Head:  Normocephalic and atraumatic. Eyes:  No icterus, sclera clear. Conjuctiva pink.  Abdomen:  Bowel sounds present, soft, non-distended. Very mild TTP in LUQ and periumbilical region.  No rebound or guarding. No masses appreciated.  Msk:  Symmetrical without gross deformities. Normal posture. Neurologic:  Alert and  oriented x4;  grossly normal neurologically. Skin:  Warm and dry, intact without significant lesions.  Psych:  Normal mood and affect.  Intake/Output from previous day: 08/11 0701 - 08/12 0700 In: 5255.6 [P.O.:480; I.V.:4775.6] Out: -  Intake/Output this shift: Total I/O In: 360 [P.O.:360] Out: -   Lab Results: Recent Labs    06/03/24 1125 06/04/24 0512  WBC 8.7 11.9*  HGB 14.1 12.6  HCT 40.2 35.8*  PLT 217 195   BMET Recent Labs    06/04/24 0512 06/05/24 0450 06/06/24 0443  NA 133* 133* 133*  K 3.5 3.7 3.9  CL 99 102 102  CO2 25 23 22   GLUCOSE 95 86 81  BUN <5* <5* 5*  CREATININE 0.54 0.52 0.56  CALCIUM 7.9* 8.1* 8.1*   LFT Recent Labs    06/03/24 1220 06/04/24 0512  PROT 6.0* 5.6*  ALBUMIN 3.6 3.3*  AST 15 14*  ALT 29 22  ALKPHOS 30* 28*  BILITOT 0.9 1.2  BILIDIR 0.1  --   IBILI 0.8  --      Assessment: 43 y.o. female with a history of  migraines, gastritis, anxiety/depression, chronic back pain on opioids, nausea/vomiting/abdominal pain, admitted with recurrent nausea vomiting again on 8/9, recently inpatient 8/5-8/7 and presented to the ED again on 8/8, then presented again on 8/9 with persistent symptoms.   Abdominal pain with nausea and vomiting:  Prior EGD in Nov 2024 with erosive gastropathy, CTx X 3 on file since Aug 2024 with most recent on 8/5 unrevealing. Gallbladder absent, no bump in LFTs or lipase, troponins negative.  Notes psychosocial stressors (loss of multiple family members and an animal) seemed to coincide with recent hospitalizations.   Suspect recurrent symptoms multifactorial with cyclical vomiting syndrome and cannabinoid use that could be contrtibuting to hyperemsis syndrome.   Reglan  was added to her regimen evening of 8/10 and Elavil  also started at bedtime on 8/11. She is improving very slowly. Seemed to tolerate a full liquid diet this morning. No vomiting in 24 hours. Still with some abdominal pain, but reports it isn't as severe.   CTA had been brought up for consideration after last EGD if persistent symptoms, but this hasn't been pursued thus far. Spoke with Dr. Cinderella who recommended we go ahead and pursue this today. Otherwise, will continue current regimen.   As she is currently on multiple agents that can contribute to  QT prolongation she will need close monitoring. Will plan for repeat EKG today.    Plan: CTA abdomen and pelvis today.  Needs repeat EKG today to monitor for QT prolongation. Will place order for this. Continue Elavil  25 mg at bedtime.  Continue Reglan  5 mg TID before meals Continue Protonix  40 mg BID Continue compazine  q6 hours for now.  Continue full liquid diet today. Advance as tolerated.     LOS: 3 days    06/06/2024, 9:10 AM   [facility], [facility]

## 2024-06-06 NOTE — Progress Notes (Signed)
 Pt's IV has infiltrated. Pt is a difficult stick. This RN attempted and was unsuccessful. Kellogg RN

## 2024-06-06 NOTE — Plan of Care (Signed)

## 2024-06-07 ENCOUNTER — Telehealth (INDEPENDENT_AMBULATORY_CARE_PROVIDER_SITE_OTHER): Payer: Self-pay | Admitting: Gastroenterology

## 2024-06-07 DIAGNOSIS — F12188 Cannabis abuse with other cannabis-induced disorder: Secondary | ICD-10-CM | POA: Diagnosis not present

## 2024-06-07 DIAGNOSIS — K219 Gastro-esophageal reflux disease without esophagitis: Secondary | ICD-10-CM | POA: Diagnosis not present

## 2024-06-07 DIAGNOSIS — R1115 Cyclical vomiting syndrome unrelated to migraine: Secondary | ICD-10-CM | POA: Diagnosis not present

## 2024-06-07 DIAGNOSIS — R1110 Vomiting, unspecified: Secondary | ICD-10-CM | POA: Diagnosis not present

## 2024-06-07 DIAGNOSIS — R1013 Epigastric pain: Secondary | ICD-10-CM

## 2024-06-07 LAB — BASIC METABOLIC PANEL WITH GFR
Anion gap: 9 (ref 5–15)
BUN: <5 mg/dL — ABNORMAL LOW (ref 6–20)
CO2: 23 mmol/L (ref 22–32)
Calcium: 8.3 mg/dL — ABNORMAL LOW (ref 8.9–10.3)
Chloride: 100 mmol/L (ref 98–111)
Creatinine, Ser: 0.65 mg/dL (ref 0.44–1.00)
GFR, Estimated: >60 mL/min (ref >60)
Glucose, Bld: 90 mg/dL (ref 70–99)
Potassium: 3.8 mmol/L (ref 3.5–5.1)
Sodium: 132 mmol/L — ABNORMAL LOW (ref 135–145)

## 2024-06-07 LAB — MAGNESIUM: Magnesium: 1.9 mg/dL (ref 1.7–2.4)

## 2024-06-07 MED ORDER — AMITRIPTYLINE HCL 25 MG PO TABS: 25.0000 mg | ORAL_TABLET | Freq: Every day | ORAL | 1 refills | Status: DC

## 2024-06-07 MED ORDER — PANTOPRAZOLE SODIUM 40 MG PO TBEC: 40.0000 mg | DELAYED_RELEASE_TABLET | Freq: Two times a day (BID) | ORAL | 1 refills | Status: DC

## 2024-06-07 NOTE — Plan of Care (Signed)
   Problem: Activity: Goal: Risk for activity intolerance will decrease Outcome: Progressing   Problem: Coping: Goal: Level of anxiety will decrease Outcome: Progressing

## 2024-06-07 NOTE — Progress Notes (Signed)
 Discharge instructions (including medications) discussed with and copy provided to patient. Patient given opportunity to ask questions. Questions clarified.

## 2024-06-07 NOTE — Plan of Care (Signed)

## 2024-06-07 NOTE — Plan of Care (Signed)
  Problem: Education: Goal: Knowledge of General Education information will improve Description: Including pain rating scale, medication(s)/side effects and non-pharmacologic comfort measures 06/07/2024 1343 by Johnie Will HERO, RN Outcome: Adequate for Discharge 06/07/2024 1024 by Johnie Will HERO, RN Outcome: Progressing   Problem: Health Behavior/Discharge Planning: Goal: Ability to manage health-related needs will improve 06/07/2024 1343 by Johnie Will HERO, RN Outcome: Adequate for Discharge 06/07/2024 1024 by Johnie Will HERO, RN Outcome: Progressing   Problem: Clinical Measurements: Goal: Ability to maintain clinical measurements within normal limits will improve 06/07/2024 1343 by Johnie Will HERO, RN Outcome: Adequate for Discharge 06/07/2024 1024 by Johnie Will HERO, RN Outcome: Progressing Goal: Will remain free from infection 06/07/2024 1343 by Johnie Will HERO, RN Outcome: Adequate for Discharge 06/07/2024 1024 by Johnie Will HERO, RN Outcome: Progressing Goal: Diagnostic test results will improve 06/07/2024 1343 by Johnie Will HERO, RN Outcome: Adequate for Discharge 06/07/2024 1024 by Johnie Will HERO, RN Outcome: Progressing Goal: Respiratory complications will improve 06/07/2024 1343 by Johnie Will HERO, RN Outcome: Adequate for Discharge 06/07/2024 1024 by Johnie Will HERO, RN Outcome: Progressing Goal: Cardiovascular complication will be avoided 06/07/2024 1343 by Johnie Will HERO, RN Outcome: Adequate for Discharge 06/07/2024 1024 by Johnie Will HERO, RN Outcome: Progressing   Problem: Activity: Goal: Risk for activity intolerance will decrease 06/07/2024 1343 by Johnie Will HERO, RN Outcome: Adequate for Discharge 06/07/2024 1024 by Johnie Will HERO, RN Outcome: Progressing   Problem: Nutrition: Goal: Adequate nutrition will be maintained 06/07/2024 1343 by Johnie Will HERO, RN Outcome: Adequate for  Discharge 06/07/2024 1024 by Johnie Will HERO, RN Outcome: Progressing   Problem: Coping: Goal: Level of anxiety will decrease 06/07/2024 1343 by Johnie Will HERO, RN Outcome: Adequate for Discharge 06/07/2024 1024 by Johnie Will HERO, RN Outcome: Progressing   Problem: Elimination: Goal: Will not experience complications related to bowel motility 06/07/2024 1343 by Johnie Will HERO, RN Outcome: Adequate for Discharge 06/07/2024 1024 by Johnie Will HERO, RN Outcome: Progressing Goal: Will not experience complications related to urinary retention 06/07/2024 1343 by Johnie Will HERO, RN Outcome: Adequate for Discharge 06/07/2024 1024 by Johnie Will HERO, RN Outcome: Progressing   Problem: Pain Managment: Goal: General experience of comfort will improve and/or be controlled 06/07/2024 1343 by Johnie Will HERO, RN Outcome: Adequate for Discharge 06/07/2024 1024 by Johnie Will HERO, RN Outcome: Progressing   Problem: Safety: Goal: Ability to remain free from injury will improve 06/07/2024 1343 by Johnie Will HERO, RN Outcome: Adequate for Discharge 06/07/2024 1024 by Johnie Will HERO, RN Outcome: Progressing   Problem: Skin Integrity: Goal: Risk for impaired skin integrity will decrease 06/07/2024 1343 by Johnie Will HERO, RN Outcome: Adequate for Discharge 06/07/2024 1024 by Johnie Will HERO, RN Outcome: Progressing

## 2024-06-07 NOTE — Discharge Summary (Signed)
 Physician Discharge Summary   Patient: [name] MRN: [id number] DOB: 05-30-1981  Admit date:     06/03/2024  Discharge date: 06/07/24  Discharge Physician: Eric Nunnery   PCP: [facility]   Recommendations at discharge:  Repeat basic metabolic panel after 5 electrolytes renal function Continue assisting patient cannabis products cessation Continue assisting patient with nicotine cessation. - Repeat thyroid  panel in 4-6 weeks to assure stability; patient with mild hyperthyroidism by TSH level that could be secondary to sick thyroid  syndrome  Discharge Diagnoses: Principal Problem:   Intractable vomiting Active Problems:   Cyclic vomiting syndrome   Cannabis hyperemesis syndrome concurrent with and due to cannabis abuse (HCC)   Gastroesophageal reflux disease without esophagitis  Improved brief History:  43 year old female with a history of anxiety, hypertension, erosive gastritis, migraine headache, chronic back pain with opioid dependence presents with abdominal pain, chest pain, and nausea and vomiting.  Notably, the patient was recently hospitalized from 05/30/2024 to 06/01/2024 secondary to abdominal pain with intractable nausea and vomiting.  She was treated symptomatically.  She gradually improved, but states that her abdominal pain never really goes entirely away.  However she did tolerate her diet at the time of discharge.  She was discharged home with Carafate  and pantoprazole  twice daily. Unfortunately, she returned to the hospital the next day on 06/02/24.  She was treated symptomatically in the emergency department with antiemetics and opioids.  She was discharged home in stable condition. She returned once again on the morning of 06/03/2024 with the above symptoms.  She states she did have some hematemesis on the morning of 06/03/2024.  Her last bowel movement was on the morning of 06/03/2024.  There is no hematochezia or melena.  She denies any worsening  shortness of breath, cough, hemoptysis.  She had low-grade temperature of 99.8 F.  She denies any rashes or synovitis.  She denies any dysuria or hematuria.  She denies any palpitations, shortness breath, dizziness, syncope.  She has had worsening generalized weakness.   Notably, the patient had similar presentation in November 2024.  She underwent EGD 09/03/2023 which showed erosive gastritis with stigmata of recent bleeding.   In the ED, the patient was afebrile hemodynamically stable with oxygen saturation 99% room air.  WBC 8.7, hemoglobin 14.1, platelet 217.  Sodium 136, potassium 3.5, bicarbonate 26, serum creatinine 0.68.  LFTs unremarkable.  Lipase 31.  EKG showed sinus rhythm without any acute ST changes.  Troponin 3.  06/02/2024 troponin 4>> 4. In the ED, the patient was given hydromorphone  1 mg x 2, pantoprazole , Compazine , and Phenergan .  GI was consulted to assist with   Assessment and Plan: Cyclic vomiting syndrome -Likely incited from the patient's cannabis use - Patient gives a history of using CBD Gummies regularly at bedtime for period of time - GI consult appreciated>>given aprepitant  8/11, now elavil  - continue pantoprazole  twice daily. -Patient tolerating diet and reporting no nausea or vomiting at discharge - Continue as needed antiemetics and the use of Elavil  as recommended by GI service. - Continue patient follow-up with gastroenterology.   Intractable abdominal pain -06/03/24 acute abd series--neg - 05/30/2024 CT abd/pelvis--negative for acute findings -06/06/24 CTA abd/pelvis--No evidence of mesenteric arterial occlusive disease  -Continue as needed analgesics.   Opioid dependence/chronic back pain - PDMP reviewed -Norco 10/325, #120, last refill 7/26 - Gabapentin  600 mg, #120, last refill 04/17/2024 - Continue home analgesic therapy.   Nicotine usage - Patient vapes regularly -Cessation counseling provided.   Mood  disorder - Continue trazodone  and  buspirone  -Continue patient follow-up with psychiatry service. - No suicidal ideation or hallucination.   Hypomagnesemia -repleted - Continue to follow electrolytes trend.   Hyponatremia -due to poor solute intake, volume depletion and mild dehydration -stable and within normal limits after fluid resuscitation. - Repeat base metabolic panel at follow-up visit.   Abnormal thyroid  functions -currently suggest mild hyperthyroidism -TSH 0.292 -Free T4--1.34 - Follow-up thyroid  panel in 4-6 weeks; most likely secondary to sick thyroid  syndrome.   Consultants: Gastroenterology service Procedures performed: See below for x-ray reports. Disposition: Home Diet recommendation: Regular diet.  DISCHARGE MEDICATION: Allergies as of 06/07/2024       Reactions   Orange Fruit [citrus] Other (See Comments)   Fever blisters   Demerol Nausea Only        Medication List     STOP taking these medications    sucralfate  1 g tablet Commonly known as: Carafate        TAKE these medications    amitriptyline  25 MG tablet Commonly known as: ELAVIL  Take 1 tablet (25 mg total) by mouth at bedtime.   busPIRone  10 MG tablet Commonly known as: BUSPAR  Take 10 mg by mouth 3 (three) times daily.   gabapentin  600 MG tablet Commonly known as: NEURONTIN  Take 600 mg by mouth 4 (four) times daily.   HYDROcodone -acetaminophen  10-325 MG tablet Commonly known as: NORCO Take 1 tablet by mouth every 8 (eight) hours as needed for moderate pain (pain score 4-6).   Multivitamin Adult Chew Chew 2 each by mouth daily.   Nurtec 75 MG Tbdp Generic drug: Rimegepant Sulfate  Take 1 tablet by mouth as needed (migraine).   ondansetron  8 MG disintegrating tablet Commonly known as: ZOFRAN -ODT Take 1 tablet (8 mg total) by mouth every 8 (eight) hours as needed. What changed: reasons to take this   pantoprazole  40 MG tablet Commonly known as: PROTONIX  Take 1 tablet (40 mg total) by mouth 2 (two) times  daily.   promethazine  25 MG suppository Commonly known as: PHENERGAN  Place 1 suppository (25 mg total) rectally every 6 (six) hours as needed for nausea or vomiting.   traZODone  150 MG tablet Commonly known as: DESYREL  Take 300 mg by mouth daily.        Follow-up Information     [facility] [employer] [facility]. Schedule an appointment as soon as possible for a visit in 10 day(s).   Specialty: Family Medicine Contact information: [address] [place] [address] [phone]                Discharge Exam: Filed Weights   06/03/24 1117  Weight: 64.9 kg   General exam: Alert, awake, oriented x 3 Respiratory system: Clear to auscultation. Respiratory effort normal. Cardiovascular system:RRR. No murmurs, rubs, gallops. Gastrointestinal system: Abdomen is nondistended, soft and nontender. No organomegaly or masses felt. Normal bowel sounds heard. Central nervous system: Alert and oriented. No focal neurological deficits. Extremities: No C/C/E, +pedal pulses Skin: No rashes, lesions or ulcers Psychiatry: Judgement and insight appear normal. Mood & affect appropriate.    Condition at discharge: Stable and improved.  The results of significant diagnostics from this hospitalization (including imaging, microbiology, ancillary and laboratory) are listed below for reference.   Imaging Studies: CT Angio Abd/Pel w/ and/or w/o Result Date: 06/06/2024 CLINICAL DATA:  Abdominal pain, nausea and vomiting. EXAM: CT ANGIOGRAPHY ABDOMEN AND PELVIS WITH CONTRAST AND WITHOUT CONTRAST TECHNIQUE: Multidetector CT imaging of the abdomen and pelvis was performed using  the standard protocol during bolus administration of intravenous contrast. Multiplanar reconstructed images and MIPs were obtained and reviewed to evaluate the vascular anatomy. RADIATION DOSE REDUCTION: This exam was performed according to the departmental dose-optimization program which includes automated  exposure control, adjustment of the mA and/or kV according to patient size and/or use of iterative reconstruction technique. CONTRAST:  OMNIPAQUE  IOHEXOL  350 MG/ML SOLN COMPARISON:  CT of the abdomen and pelvis on 05/30/2024 FINDINGS: VASCULAR Aorta: Normal caliber abdominal aorta without aneurysm or dissection. Minimal calcified plaque in the distal aorta. Celiac: Normally patent. Normally patent branch vessels and branching anatomy. SMA: Normally patent. Renals: Normally patent bilateral single renal arteries. IMA: Normally patent. Inflow: Normally patent bilateral common, internal and external iliac arteries. Proximal Outflow: Normally patent common femoral arteries and femoral bifurcations. Veins: Venous phase imaging demonstrates normal patency of venous structures in the abdomen and pelvis including mesenteric veins, portal vein and splenic vein. IVC is normally positioned. Stable mildly dilated left pelvic veins that communicate with a mildly dilated left ovarian vein measuring up to 6 mm. Review of the MIP images confirms the above findings. NON-VASCULAR Lower chest: No acute abnormality. Hepatobiliary: No focal liver abnormality is seen. Status post cholecystectomy. No biliary dilatation. Pancreas: Unremarkable. No pancreatic ductal dilatation or surrounding inflammatory changes. Spleen: Normal in size without focal abnormality. Adrenals/Urinary Tract: Adrenal glands are unremarkable. Kidneys are normal, without renal calculi, focal lesion, or hydronephrosis. Bladder is unremarkable. Stomach/Bowel: Bowel shows no evidence of obstruction, ileus, inflammation or lesion. The appendix is not discretely visualized. No free intraperitoneal air. Lymphatic: No enlarged abdominal or pelvic lymph nodes. Reproductive: Uterus and bilateral adnexa are unremarkable. Cyst or adjacent cysts of the left ovary measure up to 2.6 cm in total diameter. Other: No abdominal wall hernia or abnormality. No abdominopelvic  ascites. Musculoskeletal: No acute or significant osseous findings. IMPRESSION: 1. No acute findings in the abdomen or pelvis. 2. No evidence of mesenteric arterial occlusive disease with normally patent mesenteric visceral arteries. 3. Normally patent mesenteric veins and portal vein. 4. Stable mildly dilated left pelvic veins that communicate with a mildly dilated left ovarian vein measuring up to 6 mm. 5. Cyst or adjacent cysts of the left ovary measure up to 2.6 cm in total diameter. Electronically Signed   By: Marcey Moan M.D.   On: 06/06/2024 14:46   DG Abd Portable 2 Views Result Date: 06/03/2024 CLINICAL DATA:  Chest pain and abdominal pain. EXAM: PORTABLE ABDOMEN - 2 VIEW COMPARISON:  None Available. FINDINGS: Stool in the ascending colon. No small bowel dilatation. No unexpected radiopaque calculi. Visualized chest is grossly unremarkable. Partially imaged intramedullary rod and proximal interlocking screw in the proximal left femur. IMPRESSION: No acute findings. Electronically Signed   By: Newell Eke M.D.   On: 06/03/2024 13:12   DG Chest 2 View Result Date: 06/03/2024 CLINICAL DATA:  Chest pain and abdominal pain. EXAM: CHEST - 2 VIEW COMPARISON:  05/30/2024. FINDINGS: Trachea is midline. Heart size normal. Lungs are clear. No pleural fluid. IMPRESSION: Negative. Electronically Signed   By: Newell Eke M.D.   On: 06/03/2024 11:52   CT ABDOMEN PELVIS W CONTRAST Result Date: 05/30/2024 CLINICAL DATA:  Abdominal pain, acute, nonlocalized Vomiting for 24 hours with more recent onset of left-sided chest pain radiating into the left arm. EXAM: CT ABDOMEN AND PELVIS WITH CONTRAST TECHNIQUE: Multidetector CT imaging of the abdomen and pelvis was performed using the standard protocol following bolus administration of intravenous contrast. RADIATION DOSE REDUCTION: This  exam was performed according to the departmental dose-optimization program which includes automated exposure control,  adjustment of the mA and/or kV according to patient size and/or use of iterative reconstruction technique. CONTRAST:  OMNIPAQUE  IOHEXOL  300 MG/ML  SOLN COMPARISON:  Abdominopelvic CT 08/30/2023. FINDINGS: Lower chest: Stable small calcified granuloma medially in the right lower lobe. The lung bases are otherwise clear. No significant pleural or pericardial effusion. Hepatobiliary: The liver is normal in density without suspicious focal abnormality. No significant biliary dilatation status post cholecystectomy. Pancreas: Unremarkable. No pancreatic ductal dilatation or surrounding inflammatory changes. Spleen: Normal in size without focal abnormality. Adrenals/Urinary Tract: Both adrenal glands appear normal. No evidence of urinary tract calculus, suspicious renal lesion or hydronephrosis. The bladder appears unremarkable for its degree of distention. Stomach/Bowel: No enteric contrast administered. The stomach appears unremarkable for its degree of distension. No evidence of bowel wall thickening, distention or surrounding inflammatory change. The appendix appears normal. Vascular/Lymphatic: There are no enlarged abdominal or pelvic lymph nodes. Mild aortic atherosclerosis without evidence of aneurysm or large vessel occlusion. Reproductive: The uterus and ovaries appear unchanged. There are no suspicious adnexal findings. Stable small left ovarian vein varices. Other: No evidence of abdominal wall mass or hernia. No ascites or pneumoperitoneum. Musculoskeletal: No acute or significant osseous findings. Mild facet arthropathy in the lower lumbar spine. Previous left femoral ORIF. IMPRESSION: 1. No acute findings or explanation for the patient's symptoms. 2. Previous cholecystectomy without biliary dilatation. 3. Stable small left ovarian vein varices. 4.  Aortic Atherosclerosis (ICD10-I70.0). Electronically Signed   By: Elsie Perone M.D.   On: 05/30/2024 11:22   DG Chest Port 1 View Result Date:  05/30/2024 CLINICAL DATA:  pain EXAM: PORTABLE CHEST - 1 VIEW COMPARISON:  September 02, 2023 FINDINGS: No focal airspace consolidation, pleural effusion, or pneumothorax. No cardiomegaly. No acute fracture or destructive lesion. IMPRESSION: No acute cardiopulmonary abnormality. Electronically Signed   By: Rogelia Myers M.D.   On: 05/30/2024 09:52    Microbiology: Results for orders placed or performed in visit on 12/16/17  GC/Chlamydia Probe Amp     Status: None   Collection Time: 12/16/17  4:30 PM   [facility]  Result Value Ref Range Status   Chlamydia trachomatis, NAA Negative Negative Final   Neisseria gonorrhoeae by PCR Negative Negative Final    Labs: CBC: Recent Labs  Lab 06/01/24 0454 06/02/24 0349 06/03/24 1125 06/04/24 0512  WBC 10.1 9.5 8.7 11.9*  NEUTROABS  --  7.4  --   --   HGB 12.7 14.5 14.1 12.6  HCT 36.7 41.0 40.2 35.8*  MCV 93.6 92.3 93.1 92.7  PLT 169 189 217 195   Basic Metabolic Panel: Recent Labs  Lab 06/01/24 0454 06/02/24 0349 06/03/24 1125 06/04/24 0512 06/05/24 0450 06/06/24 0443 06/07/24 0526  NA 134* 136 136 133* 133* 133* 132*  K 3.9 3.6 3.5 3.5 3.7 3.9 3.8  CL 104 98 99 99 102 102 100  CO2 25 23 26 25 23 22 23   GLUCOSE 124* 113* 102* 95 86 81 90  BUN <5* 5* 5* <5* <5* 5* <5*  CREATININE 0.64 0.71 0.68 0.54 0.52 0.56 0.65  CALCIUM 8.1* 8.7* 8.6* 7.9* 8.1* 8.1* 8.3*  MG 1.9 1.8  --   --  1.6* 1.7 1.9   Liver Function Tests: Recent Labs  Lab 06/02/24 0349 06/03/24 1220 06/04/24 0512  AST 27 15 14*  ALT 44 29 22  ALKPHOS 34* 30* 28*  BILITOT 1.1 0.9 1.2  PROT  6.8 6.0* 5.6*  ALBUMIN 3.9 3.6 3.3*   CBG: No results for input(s): GLUCAP in the last 168 hours.  Discharge time spent:  35 minutes.  Signed: Eric Nunnery, MD [facility] 06/07/2024

## 2024-06-07 NOTE — Progress Notes (Signed)
 Subjective: Feeling much better today. No nausea or vomiting. Tolerated eggs this am. Very mild epigastric pain. Reports she declined scheduled nausea meds as she wanted to see how her symptoms were doing without masking them. Last BM was yesterday, looser which is baseline for her. Patient has requested to follow up as an outpatient with [name] if possible.   Objective: Vital signs in last 24 hours: Temp:  [99 F (37.2 C)-99.2 F (37.3 C)] 99 F (37.2 C) (08/13 0358) Pulse Rate:  [70-80] 70 (08/13 0358) Resp:  [20] 20 (08/13 0358) BP: (136-154)/(79-93) 141/79 (08/13 0358) SpO2:  [96 %-98 %] 98 % (08/13 0358) Last BM Date : 06/04/24 General:   Alert and oriented, pleasant Head:  Normocephalic and atraumatic. Eyes:  No icterus, sclera clear. Conjuctiva pink.  Mouth:  Without lesions, mucosa pink and moist.  Heart:  S1, S2 present, no murmurs noted.  Lungs: Clear to auscultation bilaterally, without wheezing, rales, or rhonchi.  Abdomen:  Bowel sounds present, soft, non-distended. Mild TTP of epigastric region. No HSM or hernias noted. No rebound or guarding. No masses appreciated  Msk:  Symmetrical without gross deformities. Normal posture.. Neurologic:  Alert and  oriented x4;  grossly normal neurologically. Skin:  Warm and dry, intact without significant lesions.  Psych:  Alert and cooperative. Normal mood and affect.  Intake/Output from previous day: 08/12 0701 - 08/13 0700 In: 2535.9 [P.O.:600; I.V.:1885.3; IV Piggyback:50.6] Out: -  Intake/Output this shift: No intake/output data recorded.  Lab Results: No results for input(s): WBC, HGB, HCT, PLT in the last 72 hours. BMET Recent Labs    06/05/24 0450 06/06/24 0443 06/07/24 0526  NA 133* 133* 132*  K 3.7 3.9 3.8  CL 102 102 100  CO2 23 22 23   GLUCOSE 86 81 90  BUN <5* 5* <5*  CREATININE 0.52 0.56 0.65  CALCIUM 8.1* 8.1* 8.3*    Studies/Results: CT Angio Abd/Pel w/ and/or w/o Result Date:  06/06/2024 CLINICAL DATA:  Abdominal pain, nausea and vomiting. EXAM: CT ANGIOGRAPHY ABDOMEN AND PELVIS WITH CONTRAST AND WITHOUT CONTRAST TECHNIQUE: Multidetector CT imaging of the abdomen and pelvis was performed using the standard protocol during bolus administration of intravenous contrast. Multiplanar reconstructed images and MIPs were obtained and reviewed to evaluate the vascular anatomy. RADIATION DOSE REDUCTION: This exam was performed according to the departmental dose-optimization program which includes automated exposure control, adjustment of the mA and/or kV according to patient size and/or use of iterative reconstruction technique. CONTRAST:  OMNIPAQUE  IOHEXOL  350 MG/ML SOLN COMPARISON:  CT of the abdomen and pelvis on 05/30/2024 FINDINGS: VASCULAR Aorta: Normal caliber abdominal aorta without aneurysm or dissection. Minimal calcified plaque in the distal aorta. Celiac: Normally patent. Normally patent branch vessels and branching anatomy. SMA: Normally patent. Renals: Normally patent bilateral single renal arteries. IMA: Normally patent. Inflow: Normally patent bilateral common, internal and external iliac arteries. Proximal Outflow: Normally patent common femoral arteries and femoral bifurcations. Veins: Venous phase imaging demonstrates normal patency of venous structures in the abdomen and pelvis including mesenteric veins, portal vein and splenic vein. IVC is normally positioned. Stable mildly dilated left pelvic veins that communicate with a mildly dilated left ovarian vein measuring up to 6 mm. Review of the MIP images confirms the above findings. NON-VASCULAR Lower chest: No acute abnormality. Hepatobiliary: No focal liver abnormality is seen. Status post cholecystectomy. No biliary dilatation. Pancreas: Unremarkable. No pancreatic ductal dilatation or surrounding inflammatory changes. Spleen: Normal in size without focal abnormality. Adrenals/Urinary Tract: Adrenal glands  are  unremarkable. Kidneys are normal, without renal calculi, focal lesion, or hydronephrosis. Bladder is unremarkable. Stomach/Bowel: Bowel shows no evidence of obstruction, ileus, inflammation or lesion. The appendix is not discretely visualized. No free intraperitoneal air. Lymphatic: No enlarged abdominal or pelvic lymph nodes. Reproductive: Uterus and bilateral adnexa are unremarkable. Cyst or adjacent cysts of the left ovary measure up to 2.6 cm in total diameter. Other: No abdominal wall hernia or abnormality. No abdominopelvic ascites. Musculoskeletal: No acute or significant osseous findings. IMPRESSION: 1. No acute findings in the abdomen or pelvis. 2. No evidence of mesenteric arterial occlusive disease with normally patent mesenteric visceral arteries. 3. Normally patent mesenteric veins and portal vein. 4. Stable mildly dilated left pelvic veins that communicate with a mildly dilated left ovarian vein measuring up to 6 mm. 5. Cyst or adjacent cysts of the left ovary measure up to 2.6 cm in total diameter. Electronically Signed   By: Marcey Moan M.D.   On: 06/06/2024 14:46    Assessment: [name] is a 43 y.o. female with a history of migraines, gastritis, anxiety/depression, chronic back pain on opioids, nausea/vomiting/abdominal pain, admitted with recurrent nausea vomiting again on 8/9, recently inpatient 8/5-8/7 and presented to the ED again on 8/8, then presented again on 8/9 with persistent symptoms.   Abdominal pain with nausea/vomiting: -EGD in Nov 2024 with erosive gastropathy, CT X 3 on file since Aug 2024 with most recent on 8/5 unrevealing (not including CTA yesterday) -GB absent -no elevation of LFTs/lipase, troponins negative -endorses psychosocial stressors recently -reglan  added to regimen 8/10 -elavil  at bedtime started 8/11 -CTA yesterday with patent vasculature, dilated left ovarian vein, Left ovarian cyst -on multiple agents that can prolong QT, will need close  monitoring, EGD yesterday with QT 420 (normal)/QtcB 469(borderline prolonged)  -she has not had compazine  or reglan  since yesterday morning, can likely use on as needed basis for now  Symptoms likely multifactorial in setting of high stress, cannabinoid use. 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 Seems to be improving with the above regimen. Was able to tolerate solid foods this a.m. and reports she has not had schedule anti emetics since yesterday morning. From GI standpoint, I think she is stable for discharge, will plan for close outpatient GI follow up.   Plan: Can use reglan  5mg  TID PRN  Can use compazine  [id number]  Continue Elavil  25mg  at bedtime Miralax daily PPI BID Advance diet as tolerated   GI will sign off for discharge today, planned outpatient follow up in 2-3 weeks.     LOS: 4 days    06/07/2024, 9:12 AM   Theodore Rahrig L. Shaliyah Taite, MSN, APRN, AGNP-C Adult-Gerontology Nurse Practitioner [facility]

## 2024-06-08 ENCOUNTER — Ambulatory Visit: Payer: Self-pay | Admitting: Gastroenterology

## 2024-06-12 NOTE — Telephone Encounter (Signed)
 [name]

## 2024-07-12 ENCOUNTER — Encounter (INDEPENDENT_AMBULATORY_CARE_PROVIDER_SITE_OTHER): Payer: Self-pay | Admitting: Gastroenterology

## 2024-07-12 ENCOUNTER — Ambulatory Visit (INDEPENDENT_AMBULATORY_CARE_PROVIDER_SITE_OTHER): Admitting: Gastroenterology

## 2024-07-12 VITALS — BP 150/94 | HR 97 | Temp 97.8°F | Ht 63.0 in | Wt 146.8 lb

## 2024-07-12 DIAGNOSIS — R112 Nausea with vomiting, unspecified: Secondary | ICD-10-CM | POA: Diagnosis not present

## 2024-07-12 DIAGNOSIS — K219 Gastro-esophageal reflux disease without esophagitis: Secondary | ICD-10-CM

## 2024-07-12 DIAGNOSIS — Z1211 Encounter for screening for malignant neoplasm of colon: Secondary | ICD-10-CM | POA: Insufficient documentation

## 2024-07-12 DIAGNOSIS — R1115 Cyclical vomiting syndrome unrelated to migraine: Secondary | ICD-10-CM

## 2024-07-12 MED ORDER — AMITRIPTYLINE HCL 25 MG PO TABS: 25.0000 mg | ORAL_TABLET | Freq: Every day | ORAL | 5 refills | Status: DC

## 2024-07-12 MED ORDER — PANTOPRAZOLE SODIUM 40 MG PO TBEC: 40.0000 mg | DELAYED_RELEASE_TABLET | Freq: Two times a day (BID) | ORAL | 1 refills | Status: AC

## 2024-07-12 NOTE — Patient Instructions (Addendum)
 It was very nice to meet you today, as dicussed with will plan for the following :  1) Protonix  once daily  2) continue Elavil   3) Colonoscopy jan 2026   4)Ensure adequate fluid intake: Aim for 8 glasses of water  daily. Follow a high fiber diet: Include foods such as dates, prunes, pears, and kiwi.

## 2024-07-12 NOTE — Progress Notes (Signed)
 [name] Faizan Miriya Cloer , M.D. Gastroenterology & Hepatology [facility] Gastroenterology [address] [place], [place] [address] Primary Care Physician: Orpha Yancey LABOR, MD [address] [place] [address]  Chief Complaint: Cyclical vomiting syndrome, screening colonoscopy  History of Present Illness: [name] is a 43 y.o. female with  hypertension, anxiety, GERD, migraine headaches presented for follow-up on cyclical vomiting syndrome and screening colonoscopy.  Patient was seen in the hospital for intractable nausea and vomiting with abdominal pain last month.  Patient reports that since being discharged she has been symptom-free.  She believes her symptoms have been controlled after adding Elavil  to her regimen.  She takes PPI twice daily .Patient has typical symptoms of GERD which relapses if she stops taking her PPI . Patient had similar episode in November 2024 where she had an upper endoscopy found to have erosive gastritis only.  Patient occasionally has hard stools for which she takes over-the-counter ClearLax  Last EGD:08/2023  - Normal esophagus. - Erosive gastropathy with stigmata of recent bleeding. Biopsied. - Normal examined duodenum.  Last Colonoscopy:none  CTA abdomen pelvis was negative for any ischemia and was with patent vasculature   Past Medical History: Past Medical History:  Diagnosis Date   Acid reflux    Anxiety    Arthritis    Environmental allergies    Gastric ulcer    Gastritis    Hypertension    Migraine     Past Surgical History: Past Surgical History:  Procedure Laterality Date   arm surgery Left    plate in arm   BILATERAL SALPINGECTOMY     BIOPSY  09/03/2023   Procedure: BIOPSY;  Surgeon: Eartha Angelia Sieving, MD;  [facility];  Service: Gastroenterology;;   BONE BIOPSY Right    thumb   BRAIN SURGERY     evacuation of hematoma   CESAREAN SECTION     x2   CHOLECYSTECTOMY      ECTOPIC PREGNANCY SURGERY     x2   ENDOMETRIAL ABLATION  01/18/2018   Procedure: ENDOMETRIAL ABLATION WITH MINERVA;  Surgeon: Edsel Norleen GAILS, MD;  Location: [facility];  Service: Gynecology;;   ESOPHAGOGASTRODUODENOSCOPY (EGD) WITH PROPOFOL  N/A 01/30/2014   Procedure: ESOPHAGOGASTRODUODENOSCOPY (EGD) WITH PROPOFOL ;  Surgeon: Margo LITTIE Haddock, MD;  Location: [facility];  Service: Endoscopy;  Laterality: N/A;   ESOPHAGOGASTRODUODENOSCOPY (EGD) WITH PROPOFOL  N/A 09/03/2023   Procedure: ESOPHAGOGASTRODUODENOSCOPY (EGD) WITH PROPOFOL ;  Surgeon: Eartha Angelia Sieving, MD;  [facility];  Service: Gastroenterology;  Laterality: N/A;   HYSTEROSCOPY WITH D & C N/A 08/13/2015   Procedure: DILATATION AND CURETTAGE /HYSTEROSCOPY;  Surgeon: Norleen Edsel GAILS, MD;  Location: [facility];  Service: Gynecology;  Laterality: N/A;   HYSTEROSCOPY WITH D & C  01/18/2018   Procedure: CERVICAL DILATATION /HYSTEROSCOPY;  Surgeon: Edsel Norleen GAILS, MD;  Location: [facility];  Service: Gynecology;;   LEG SURGERY Left    femur rodding and removal of part of left femur   otif ankle Right     Family History: Family History  Problem Relation Age of Onset   Mental illness Mother    Diabetes Mother    Obesity Mother    Heart disease Father    Obesity Sister    Lupus Paternal Aunt    Cancer Maternal Grandfather        GASTRIC   Lupus Paternal Grandmother    Diabetes Son        boarderline   Arthritis  Other    Cancer Other    Diabetes Other     Social History: Social History   Tobacco Use  Smoking Status Former   Current packs/day: 0.00   Average packs/day: 1 pack/day for 20.0 years (20.0 ttl pk-yrs)   Types: Cigarettes   Start date: 12/02/1991   Quit date: 12/02/2011   Years since quitting: 12.6  Smokeless Tobacco Never   Social History   Substance and Sexual Activity  Alcohol Use No   Social History   Substance and Sexual Activity  Drug Use No    Allergies: Allergies  Allergen Reactions   Orange  Fruit [Citrus] Other (See Comments)    Fever blisters   Demerol Nausea Only    Medications: Current Outpatient Medications  Medication Sig Dispense Refill   gabapentin  (NEURONTIN ) 600 MG tablet Take 600 mg by mouth 4 (four) times daily.     HYDROcodone -acetaminophen  (NORCO) 10-325 MG tablet Take 1 tablet by mouth every 8 (eight) hours as needed for moderate pain (pain score 4-6).     Multiple Vitamins-Minerals (MULTIVITAMIN ADULT) CHEW Chew 2 each by mouth daily.     traZODone  (DESYREL ) 150 MG tablet Take 300 mg by mouth daily.     amitriptyline  (ELAVIL ) 25 MG tablet Take 1 tablet (25 mg total) by mouth at bedtime. 30 tablet 5   NURTEC 75 MG TBDP Take 1 tablet by mouth as needed (migraine). (Patient not taking: Reported on 07/12/2024)     pantoprazole  (PROTONIX ) 40 MG tablet Take 1 tablet (40 mg total) by mouth 2 (two) times daily. 60 tablet 1   promethazine  (PHENERGAN ) 25 MG suppository Place 1 suppository (25 mg total) rectally every 6 (six) hours as needed for nausea or vomiting. (Patient not taking: Reported on 07/12/2024) 12 each 2   No current facility-administered medications for this visit.    Review of Systems: GENERAL: negative for malaise, night sweats HEENT: No changes in hearing or vision, no nose bleeds or other nasal problems. NECK: Negative for lumps, goiter, pain and significant neck swelling RESPIRATORY: Negative for cough, wheezing CARDIOVASCULAR: Negative for chest pain, leg swelling, palpitations, orthopnea GI: SEE HPI MUSCULOSKELETAL: Negative for joint pain or swelling, back pain, and muscle pain. SKIN: Negative for lesions, rash HEMATOLOGY Negative for prolonged bleeding, bruising easily, and swollen nodes. ENDOCRINE: Negative for cold or heat intolerance, polyuria, polydipsia and goiter. NEURO: negative for tremor, gait imbalance, syncope and seizures. The remainder of the review of systems is noncontributory.   Physical Exam: BP (!) 150/94 (BP Location:  Left Arm, Patient Position: Sitting, Cuff Size: Normal)   Pulse 97   Temp 97.8 F (36.6 C) (Temporal)   Ht 5' 3 (1.6 m)   Wt 146 lb 12.8 oz (66.6 kg)   BMI 26.00 kg/m  GENERAL: The patient is AO x3, in no acute distress. HEENT: Head is normocephalic and atraumatic. EOMI are intact. Mouth is well hydrated and without lesions. NECK: Supple. No masses LUNGS: Clear to auscultation. No presence of rhonchi/wheezing/rales. Adequate chest expansion HEART: RRR, normal s1 and s2. ABDOMEN: Soft, nontender, no guarding, no peritoneal signs, and nondistended. BS +. No masses.  Imaging/Labs: as above     Latest Ref Rng & Units 06/04/2024    5:12 AM 06/03/2024   11:25 AM 06/02/2024    3:49 AM  CBC  WBC 4.0 - 10.5 K/uL 11.9  8.7  9.5   Hemoglobin 12.0 - 15.0 g/dL 87.3  85.8  85.4   Hematocrit 36.0 - 46.0 % 35.8  40.2  41.0   Platelets 150 - 400 K/uL 195  217  189    No results found for: IRON, TIBC, FERRITIN  I personally reviewed and interpreted the available labs, imaging and endoscopic files.  Impression and Plan:  [name] is a 43 y.o. female with  hypertension, anxiety, GERD, migraine headaches presented for follow-up on cyclical vomiting syndrome and screening colonoscopy.  #GERD #Nausea/Vomiting   Patient had 2 hospitalization in the past 1 year for intractable nausea and vomiting accompanied by abdominal discomfort  Underwent upper endoscopy in November 2024 and recently had a negative CTA  Patient's symptoms have been well-controlled after starting Elavil  and hence this is likely cyclical vomiting syndrome  Continue amitriptyline  25 at bedtime for CVS,would also help patient with her neuropathic pain.  Although patient is on trazodone  ,  repeat Qtc was adequate and patient has tolerated well in over 1 month  Advised patient to use MiraLAX daily as TCA can worsen constipation  Can titrate down Prilosec to once daily  (lowest effective dose) symptoms recurred can  make it twice daily at that time   #Screening colonoscopy  The patient was counseled regarding the importance of colorectal cancer screening, particularly starting at age 68 due to the rising incidence of colorectal cancer in younger individuals. The benefits of screening include early detection of colorectal cancer and precancerous polyps, which can improve treatment outcomes and reduce mortality. Risks associated with screening, particularly colonoscopy, include potential complications such as bleeding and perforation. After deciding different modalities for screening for colon cancer , patient has opted to pursue Colonoscopy , which will be scheduled in Jan 2026 once she turns 44 .   All questions were answered.      Flay Ghosh Faizan Lya Holben, MD Gastroenterology and Hepatology [facility] Gastroenterology   This chart has been completed using [facility] Dictation software, and while attempts have been made to ensure accuracy , certain words and phrases may not be transcribed as intended

## 2024-07-19 DIAGNOSIS — M79605 Pain in left leg: Secondary | ICD-10-CM | POA: Diagnosis not present

## 2024-07-19 DIAGNOSIS — G43919 Migraine, unspecified, intractable, without status migrainosus: Secondary | ICD-10-CM | POA: Diagnosis not present

## 2024-07-19 DIAGNOSIS — M25571 Pain in right ankle and joints of right foot: Secondary | ICD-10-CM | POA: Diagnosis not present

## 2024-07-19 DIAGNOSIS — M545 Low back pain, unspecified: Secondary | ICD-10-CM | POA: Diagnosis not present

## 2024-07-19 DIAGNOSIS — Z13220 Encounter for screening for lipoid disorders: Secondary | ICD-10-CM | POA: Diagnosis not present

## 2024-08-01 ENCOUNTER — Other Ambulatory Visit: Payer: Self-pay | Admitting: Orthopedic Surgery

## 2024-08-01 DIAGNOSIS — M5412 Radiculopathy, cervical region: Secondary | ICD-10-CM

## 2024-08-01 MED ORDER — PREDNISONE 10 MG (48) PO TBPK: ORAL_TABLET | Freq: Every day | ORAL | 0 refills | Status: DC

## 2024-08-01 NOTE — Progress Notes (Signed)
 43 year old female presents to me with acute radicular pain starting in her neck rating down to her hand associated with some numbness and tingling with severe pain  Patient has history of cervical disc pathology has had an MRI within the last 3 years  She is currently on gabapentin  600 mg up to 2400 mg a day and is taking 3 tablets a day at this time  Recommend a steroid Dosepak make sure she is taking 600 mg 4 times a day and see what happens over 2 weeks if no improvement recommend epidural injection may need new MRI  The disc heights are overall preserved.   C2-C3: No significant spinal canal or neural foraminal stenosis.   C3-C4: There is a small central protrusion without significant spinal canal or neural foraminal stenosis.   C4-C5: There is a small central protrusion without significant spinal canal or neural foraminal stenosis.   C5-C6: There is a mild posterior disc osteophyte complex with mild uncovertebral ridging and minimal facet arthropathy resulting in mild left and no significant right neural foraminal stenosis and mild spinal canal stenosis   C6-C7: There is a mild posterior disc osteophyte complex with mild uncovertebral ridging and minimal facet arthropathy resulting in mild right and no significant left neural foraminal stenosis and mild spinal canal stenosis.   C7-T1: No significant spinal canal or neural foraminal stenosis.   IMPRESSION: Mild degenerative changes at C5-C6 and C6-C7 resulting in mild spinal canal stenosis at both levels, mild left neural foraminal stenosis at C5-C6, and mild right neural foraminal stenosis at C6-C7. No high-grade spinal canal or neural foraminal stenosis or cord or nerve root compression.     Electronically Signed   By: Maude Harry M.D.   On: 01/23/2022 16:17

## 2024-08-09 ENCOUNTER — Encounter (INDEPENDENT_AMBULATORY_CARE_PROVIDER_SITE_OTHER): Payer: Self-pay | Admitting: Gastroenterology

## 2024-08-12 ENCOUNTER — Encounter: Payer: Self-pay | Admitting: Orthopedic Surgery

## 2024-08-12 DIAGNOSIS — M5412 Radiculopathy, cervical region: Secondary | ICD-10-CM

## 2024-08-12 MED ORDER — PREDNISONE 10 MG (48) PO TBPK: ORAL_TABLET | ORAL | 0 refills | Status: DC

## 2024-08-14 ENCOUNTER — Telehealth: Payer: Self-pay

## 2024-08-14 ENCOUNTER — Telehealth: Payer: Self-pay | Admitting: Radiology

## 2024-08-14 DIAGNOSIS — M542 Cervicalgia: Secondary | ICD-10-CM

## 2024-08-14 NOTE — Telephone Encounter (Signed)
-----   Message from Taft Minerva sent at 08/14/2024  8:17 AM EDT ----- Order levada c spine   Radiculopathy rt arm

## 2024-08-14 NOTE — Telephone Encounter (Signed)
Put in order called patient to advise.

## 2024-08-14 NOTE — Telephone Encounter (Signed)
 See previous

## 2024-08-14 NOTE — Telephone Encounter (Signed)
 Patient returned your call and is asking if you could call her back when you can.  [phone]

## 2024-08-17 ENCOUNTER — Encounter: Payer: Self-pay | Admitting: Obstetrics & Gynecology

## 2024-08-17 ENCOUNTER — Ambulatory Visit (INDEPENDENT_AMBULATORY_CARE_PROVIDER_SITE_OTHER): Admitting: Obstetrics & Gynecology

## 2024-08-17 VITALS — BP 149/93 | HR 86 | Ht 63.0 in | Wt 157.8 lb

## 2024-08-17 DIAGNOSIS — N959 Unspecified menopausal and perimenopausal disorder: Secondary | ICD-10-CM | POA: Diagnosis not present

## 2024-08-17 DIAGNOSIS — Z01419 Encounter for gynecological examination (general) (routine) without abnormal findings: Secondary | ICD-10-CM

## 2024-08-17 DIAGNOSIS — N951 Menopausal and female climacteric states: Secondary | ICD-10-CM

## 2024-08-17 NOTE — Progress Notes (Signed)
 WELL-WOMAN EXAMINATION Patient name: [name] MRN [id number]  Date of birth: 12/06/1980 Chief Complaint:   Gynecologic Exam  History of Present Illness:   [name] is a 43 y.o. [id number]  female being seen today for a routine well-woman exam.   H/o ablation 2019-on occasion may have some light spotting every month but minimal.  Denies heavy bleeding or significant dysmenorrhea  She does note some night sweats but trying to avoid medication if possible.  Denies vaginal discharge, itching or irritation.  Denies pelvic or abdominal pain.  Her only other concern is weight gain; however, she has recently completed a second steroid round and change in medication.  We discussed giving her body some grace.  No LMP recorded. Patient has had an ablation.  The current method of family planning is tubal ligation.    Last pap 07/2023.  Last mammogram: 03/2024. Last colonoscopy: NA     08/17/2024    3:30 PM 08/04/2023    3:02 PM 02/28/2020    2:55 PM 11/18/2017    3:38 PM  Depression screen PHQ 2/9  Decreased Interest 0 0 0 0  Down, Depressed, Hopeless 0 0 0 0  PHQ - 2 Score 0 0 0 0  Altered sleeping 0 0 1   Tired, decreased energy 0 1 0   Change in appetite 0 0 0   Feeling bad or failure about yourself  0 0 0   Trouble concentrating 0  0   Moving slowly or fidgety/restless 0  0   Suicidal thoughts 0  0   PHQ-9 Score 0 1 1       Review of Systems:   Pertinent items are noted in HPI Denies any headaches, blurred vision, fatigue, shortness of breath, chest pain, abdominal pain, bowel movements, urination, or intercourse unless otherwise stated above.  Pertinent History Reviewed:  Reviewed past medical,surgical, social and family history.  Reviewed problem list, medications and allergies. Physical Assessment:   Vitals:   08/17/24 1531 08/17/24 1553  BP: (!) 157/95 (!) 149/93  Pulse: 92 86  Weight: 157 lb 12.8 oz (71.6 kg)   Height: 5' 3 (1.6 m)   Body mass  index is 27.95 kg/m.        Physical Examination:   General appearance - well appearing, and in no distress  Mental status - alert, oriented to person, place, and time  Psych:  She has a normal mood and affect  Skin - warm and dry, normal color, no suspicious lesions noted  Chest - effort normal, all lung fields clear to auscultation bilaterally  Heart - normal rate and regular rhythm  Neck:  midline trachea, no thyromegaly or nodules  Breasts - breasts appear normal, no suspicious masses, no skin or nipple changes or  axillary nodes  Abdomen - soft, nontender, nondistended, no masses or organomegaly  Pelvic - VULVA: normal appearing vulva with no masses, tenderness or lesions  VAGINA: normal appearing vagina with normal color and discharge, no lesions  CERVIX: normal appearing cervix without discharge or lesions, no CMT  UTERUS: uterus is felt to be normal size, shape, consistency and nontender   ADNEXA: No adnexal masses or tenderness noted.  Extremities:  No swelling or varicosities noted  Chaperone: pt declined     Assessment & Plan:  1) Well-Woman Exam -Screening exams up-to-date, reviewed ASCCP guidelines  2) perimenopausal symptoms - Reviewed conservative options including tart cherry juice and magnesium  lotion to help with nighttime symptoms  No orders  of the defined types were placed in this encounter.   Meds: No orders of the defined types were placed in this encounter.   Follow-up: Return in about 1 year (around 08/17/2025) for Annual.   Jarah Pember, DO Attending Obstetrician & Gynecologist, [facility] for [facility], [facility]

## 2024-08-21 ENCOUNTER — Ambulatory Visit (HOSPITAL_COMMUNITY)
Admission: RE | Admit: 2024-08-21 | Discharge: 2024-08-21 | Disposition: A | Discharge status: Home / Self Care | Source: Ambulatory Visit | Attending: Orthopedic Surgery | Admitting: Orthopedic Surgery

## 2024-08-21 DIAGNOSIS — M50223 Other cervical disc displacement at C6-C7 level: Secondary | ICD-10-CM | POA: Diagnosis not present

## 2024-08-21 DIAGNOSIS — M50322 Other cervical disc degeneration at C5-C6 level: Secondary | ICD-10-CM | POA: Diagnosis not present

## 2024-08-21 DIAGNOSIS — M5021 Other cervical disc displacement,  high cervical region: Secondary | ICD-10-CM | POA: Diagnosis not present

## 2024-08-21 DIAGNOSIS — M50221 Other cervical disc displacement at C4-C5 level: Secondary | ICD-10-CM | POA: Diagnosis not present

## 2024-08-21 DIAGNOSIS — M542 Cervicalgia: Secondary | ICD-10-CM | POA: Diagnosis not present

## 2024-09-04 ENCOUNTER — Other Ambulatory Visit (INDEPENDENT_AMBULATORY_CARE_PROVIDER_SITE_OTHER): Payer: Self-pay | Admitting: Gastroenterology

## 2024-09-04 ENCOUNTER — Telehealth (INDEPENDENT_AMBULATORY_CARE_PROVIDER_SITE_OTHER): Payer: Self-pay | Admitting: Gastroenterology

## 2024-09-04 DIAGNOSIS — K581 Irritable bowel syndrome with constipation: Secondary | ICD-10-CM

## 2024-09-04 MED ORDER — LINACLOTIDE 290 MCG PO CAPS: 290.0000 ug | ORAL_CAPSULE | Freq: Every day | ORAL | 1 refills | Status: AC

## 2024-09-04 NOTE — Telephone Encounter (Signed)
 Medication Samples have been provided to the patient.  Drug name: Linzess       Strength: 290 mcg        Qty: 3 boxes   LOT: [id number]  Exp.Date: 10/26  Dosing instructions: take one capsule po daily  The patient has been instructed regarding the correct time, dose, and frequency of taking this medication, including desired effects and most common side effects.   [name] 12:09 PM 09/04/2024

## 2024-09-05 ENCOUNTER — Encounter: Payer: Self-pay | Admitting: Orthopedic Surgery

## 2024-09-05 DIAGNOSIS — M5412 Radiculopathy, cervical region: Secondary | ICD-10-CM

## 2024-09-05 MED ORDER — PREDNISONE 10 MG (48) PO TBPK: ORAL_TABLET | ORAL | 0 refills | Status: AC

## 2024-09-06 ENCOUNTER — Telehealth: Payer: Self-pay | Admitting: Radiology

## 2024-09-06 DIAGNOSIS — M542 Cervicalgia: Secondary | ICD-10-CM

## 2024-09-06 DIAGNOSIS — M5412 Radiculopathy, cervical region: Secondary | ICD-10-CM

## 2024-09-06 NOTE — Telephone Encounter (Signed)
-----   Message from Taft Minerva sent at 09/05/2024  1:56 PM EST ----- Regarding: Consult Dr Georgina  Had Mri already

## 2024-09-08 ENCOUNTER — Ambulatory Visit: Admitting: Orthopedic Surgery

## 2024-09-08 ENCOUNTER — Encounter: Payer: Self-pay | Admitting: Orthopedic Surgery

## 2024-09-08 ENCOUNTER — Other Ambulatory Visit: Payer: Self-pay | Admitting: Orthopedic Surgery

## 2024-09-08 VITALS — BP 134/108 | HR 96 | Ht 63.0 in | Wt 157.0 lb

## 2024-09-08 DIAGNOSIS — M5412 Radiculopathy, cervical region: Secondary | ICD-10-CM | POA: Diagnosis not present

## 2024-09-08 DIAGNOSIS — M542 Cervicalgia: Secondary | ICD-10-CM

## 2024-09-08 DIAGNOSIS — M25521 Pain in right elbow: Secondary | ICD-10-CM | POA: Diagnosis not present

## 2024-09-08 DIAGNOSIS — M47816 Spondylosis without myelopathy or radiculopathy, lumbar region: Secondary | ICD-10-CM | POA: Insufficient documentation

## 2024-09-08 MED ORDER — METHYLPREDNISOLONE ACETATE 40 MG/ML IJ SUSP
40.0000 mg | Freq: Once | INTRAMUSCULAR | Status: AC
  Administered 2024-09-08: 40 mg via INTRA_ARTICULAR

## 2024-09-08 NOTE — Progress Notes (Signed)
  Intake history:  Chief Complaint  Patient presents with   Arm Pain    Right/ work in today pain from neck down entire right arm worse at elbow     There were no vitals taken for this visit. There is no height or weight on file to calculate BMI.  Pharmacy? _____[facility] _________________________________  WHAT ARE WE SEEING YOU FOR TODAY?   Neck down right arm  How long has this bothered you? (DOI?DOS?WS?)  Several weeks had MRI c spine  Was there an injury? No  Anticoag.  No   Any ALLERGIES _____ Allergies  Allergen Reactions   Orange Fruit [Citrus] Other (See Comments)    Fever blisters   Demerol Nausea Only   _________________________________________   Treatment:  Have you taken:  Tylenol  No  Advil  No  Had PT No  Had injection No  Other  _________________________

## 2024-09-08 NOTE — Progress Notes (Signed)
 Chief Complaint  Patient presents with   Arm Pain    Right/ work in today pain from neck down entire right arm worse at elbow    43 year old female with severe neck pain and radicular symptoms in the right upper extremity.  She is a tried to [employer] and increased her gabapentin  without much relief.  She developed lateral elbow pain which is really causing her a lot of pain today.  She complains of stiffness in her cervical spine crepitance on range of motion  She took 1 Dosepak and got good relief but several days later the pain returned and now it is worse  I had indicated to her to see neurosurgery and I was able to order the or MRI so she would have it when she sees them.  However all of the appointments were not conducive to her schedule and she is basically scheduled out in January.  I told her to call them back and see if she can get a sooner appointment as I do not think she will make it that long  Arm Pain     PHYSICAL EXAM:  Pertinent physical findings include visual indicators that the patient is in severe pain.  She is really an excellent worker at [employer] and she is grimacing furrowed brow even though she did go to work today.  She has been working through this even though my advice is for her to stop work rest and try to get the symptoms under control  She has weakness in her grip strength on the right she has tingling in the right upper extremity primarily along the small and ring finger and ulnar side of the arm intensifying over the lateral elbow.  When she turns her neck her pain increases and the grinding is very noticeable and uncomfortable for her  Imaging recent MRI  The disc heights are overall preserved.   C2-C3: No significant spinal canal or neural foraminal stenosis.   C3-C4: There is a small central protrusion without significant spinal canal or neural foraminal stenosis.   C4-C5: There is a small central protrusion without  significant spinal canal or neural foraminal stenosis.   C5-C6: There is a mild posterior disc osteophyte complex with mild uncovertebral ridging and minimal facet arthropathy resulting in mild left and no significant right neural foraminal stenosis and mild spinal canal stenosis   C6-C7: There is a mild posterior disc osteophyte complex with mild uncovertebral ridging and minimal facet arthropathy resulting in mild right and no significant left neural foraminal stenosis and mild spinal canal stenosis.   C7-T1: No significant spinal canal or neural foraminal stenosis.   IMPRESSION: Mild degenerative changes at C5-C6 and C6-C7 resulting in mild spinal canal stenosis at both levels, mild left neural foraminal stenosis at C5-C6, and mild right neural foraminal stenosis at C6-C7. No high-grade spinal canal or neural foraminal stenosis or cord or nerve root compression.     Electronically Signed   By: Maude Harry M.D.   On: 01/23/2022 16:17     Assessment and Plan:   The patient wanted an injection of some kind to help relieve the pain.  I told her we could not inject between the olecranon and the lateral epicondyle.  She requested injection over the area for tennis elbow.  I did try to tell her that her symptoms were not consistent with tennis elbow but she said give me something anything.  So we did an elbow injection  Procedure note injection  for right elbow pain Diagnosis right foot pain with radiculopathy Anesthesia ethyl chloride was used Alcohol use is clean the skin  After we obtained verbal consent and timeout a 25-gauge needle was used to inject 40 mg of Depo-Medrol  and 3 cc of 1% lidocaine  just distal to the insertion of the ECRB  There were no complications and a sterile bandage was applied.  I told the patient to call Dr. Jeraline office back and take the next available appointment  She will continue her gabapentin  600 mg Q4  She has a prescription according  to the PDMP for hydrocodone  10 mg on October 27 by Dr. Tillman which she will continue to take and she is to finish her Dosepak

## 2024-09-08 NOTE — Patient Instructions (Addendum)
 Address: [address], [place], [place] [address] Phone: [phone] Dr Clois is the name of the Neurosurgeon with [facility], and his office will call you with appointment   Take the Dosepak until it is finished  Then take ibuprofen  800 times a day  Take the gabapentin  4 times a day  And the pain medicine as prescribed by Dr. Tillman

## 2024-09-13 DIAGNOSIS — Z1329 Encounter for screening for other suspected endocrine disorder: Secondary | ICD-10-CM | POA: Diagnosis not present

## 2024-10-09 ENCOUNTER — Telehealth: Payer: Self-pay | Admitting: Orthopedic Surgery

## 2024-10-09 NOTE — Telephone Encounter (Signed)
 Patient declines to schedule with neurosurgery multiple calls and a letter were sent. FYI

## 2024-10-12 ENCOUNTER — Telehealth (INDEPENDENT_AMBULATORY_CARE_PROVIDER_SITE_OTHER): Payer: Self-pay

## 2024-10-12 ENCOUNTER — Other Ambulatory Visit (INDEPENDENT_AMBULATORY_CARE_PROVIDER_SITE_OTHER): Payer: Self-pay | Admitting: Gastroenterology

## 2024-10-12 ENCOUNTER — Encounter: Admitting: Orthopedic Surgery

## 2024-10-12 DIAGNOSIS — R112 Nausea with vomiting, unspecified: Secondary | ICD-10-CM

## 2024-10-12 DIAGNOSIS — R1115 Cyclical vomiting syndrome unrelated to migraine: Secondary | ICD-10-CM

## 2024-10-12 MED ORDER — AMITRIPTYLINE HCL 50 MG PO TABS: 50.0000 mg | ORAL_TABLET | Freq: Every day | ORAL | 1 refills | Status: AC

## 2024-10-12 MED ORDER — PEG 3350-KCL-NA BICARB-NACL 420 G PO SOLR: 4000.0000 mL | Freq: Once | ORAL | 0 refills | Status: AC

## 2024-10-12 NOTE — Telephone Encounter (Signed)
 ATC patient to schedule colonoscopy, no answer. LVM for call back.

## 2024-10-12 NOTE — Addendum Note (Signed)
 Addended by: DALLIE LIONEL RAMAN on: 10/12/2024 09:09 AM   Modules accepted: Orders

## 2024-10-12 NOTE — Telephone Encounter (Signed)
 Spoke with patient, scheduled TCS for 11/17/2024 at 7:30am. Rx sent to pharmacy. Instructions sent to [facility] and mailed.

## 2024-10-12 NOTE — Telephone Encounter (Signed)
 Prior auth not required through [employer] on [employer].

## 2024-10-30 ENCOUNTER — Encounter: Admitting: Orthopedic Surgery

## 2024-11-13 ENCOUNTER — Other Ambulatory Visit: Payer: Self-pay

## 2024-11-13 ENCOUNTER — Encounter (HOSPITAL_COMMUNITY): Payer: Self-pay

## 2024-11-13 ENCOUNTER — Encounter (HOSPITAL_COMMUNITY)
Admission: RE | Admit: 2024-11-13 | Discharge: 2024-11-13 | Disposition: A | Discharge status: Home / Self Care | Source: Ambulatory Visit | Attending: Gastroenterology | Admitting: Gastroenterology

## 2024-11-13 VITALS — Ht 63.0 in | Wt 180.0 lb

## 2024-11-13 DIAGNOSIS — Z01818 Encounter for other preprocedural examination: Secondary | ICD-10-CM

## 2024-11-13 NOTE — Pre-Procedure Instructions (Signed)
 Attempted pre-op phonecall. Left VM for her to call us  back.

## 2024-11-17 ENCOUNTER — Ambulatory Visit (HOSPITAL_COMMUNITY)

## 2024-11-17 ENCOUNTER — Encounter (HOSPITAL_COMMUNITY): Payer: Self-pay | Admitting: Gastroenterology

## 2024-11-17 ENCOUNTER — Ambulatory Visit (HOSPITAL_COMMUNITY)
Admission: RE | Admit: 2024-11-17 | Discharge: 2024-11-17 | Disposition: A | Discharge status: Home / Self Care | Attending: Gastroenterology | Admitting: Gastroenterology

## 2024-11-17 ENCOUNTER — Other Ambulatory Visit: Payer: Self-pay

## 2024-11-17 ENCOUNTER — Encounter (INDEPENDENT_AMBULATORY_CARE_PROVIDER_SITE_OTHER): Payer: Self-pay | Admitting: *Deleted

## 2024-11-17 ENCOUNTER — Encounter (HOSPITAL_COMMUNITY)
Admission: RE | Disposition: A | Discharge status: Home / Self Care | Payer: Self-pay | Source: Home / Self Care | Attending: Gastroenterology

## 2024-11-17 DIAGNOSIS — K648 Other hemorrhoids: Secondary | ICD-10-CM | POA: Insufficient documentation

## 2024-11-17 DIAGNOSIS — F419 Anxiety disorder, unspecified: Secondary | ICD-10-CM | POA: Diagnosis not present

## 2024-11-17 DIAGNOSIS — Z87891 Personal history of nicotine dependence: Secondary | ICD-10-CM | POA: Diagnosis not present

## 2024-11-17 DIAGNOSIS — Z8711 Personal history of peptic ulcer disease: Secondary | ICD-10-CM | POA: Insufficient documentation

## 2024-11-17 DIAGNOSIS — K635 Polyp of colon: Secondary | ICD-10-CM | POA: Diagnosis not present

## 2024-11-17 DIAGNOSIS — K621 Rectal polyp: Secondary | ICD-10-CM | POA: Insufficient documentation

## 2024-11-17 DIAGNOSIS — K219 Gastro-esophageal reflux disease without esophagitis: Secondary | ICD-10-CM | POA: Insufficient documentation

## 2024-11-17 DIAGNOSIS — Z01818 Encounter for other preprocedural examination: Secondary | ICD-10-CM

## 2024-11-17 DIAGNOSIS — Z1211 Encounter for screening for malignant neoplasm of colon: Secondary | ICD-10-CM | POA: Insufficient documentation

## 2024-11-17 LAB — POCT PREGNANCY, URINE: Preg Test, Ur: NEGATIVE

## 2024-11-17 LAB — HM COLONOSCOPY

## 2024-11-17 MED ORDER — PROPOFOL 1000 MG/100ML IV EMUL
INTRAVENOUS | Status: AC
  Filled 2024-11-17: qty 400

## 2024-11-17 MED ORDER — MIDAZOLAM HCL 2 MG/2ML IJ SOLN
INTRAMUSCULAR | Status: AC
  Filled 2024-11-17: qty 2

## 2024-11-17 MED ORDER — PHENYLEPHRINE 80 MCG/ML (10ML) SYRINGE FOR IV PUSH (FOR BLOOD PRESSURE SUPPORT)
PREFILLED_SYRINGE | INTRAVENOUS | Status: AC
  Filled 2024-11-17: qty 40

## 2024-11-17 MED ORDER — PHENYLEPHRINE 80 MCG/ML (10ML) SYRINGE FOR IV PUSH (FOR BLOOD PRESSURE SUPPORT)
PREFILLED_SYRINGE | INTRAVENOUS | Status: DC | PRN
  Administered 2024-11-17 (×2): 80 ug via INTRAVENOUS

## 2024-11-17 MED ORDER — LACTATED RINGERS IV SOLN: INTRAVENOUS | Status: DC

## 2024-11-17 MED ORDER — LIDOCAINE 2% (20 MG/ML) 5 ML SYRINGE
INTRAMUSCULAR | Status: AC
  Filled 2024-11-17: qty 25

## 2024-11-17 MED ORDER — LIDOCAINE 2% (20 MG/ML) 5 ML SYRINGE
INTRAMUSCULAR | Status: DC | PRN
  Administered 2024-11-17: 80 mg via INTRAVENOUS

## 2024-11-17 MED ORDER — PROPOFOL 10 MG/ML IV BOLUS
INTRAVENOUS | Status: DC | PRN
  Administered 2024-11-17: 100 mg via INTRAVENOUS
  Administered 2024-11-17: 50 mg via INTRAVENOUS

## 2024-11-17 MED ORDER — PROPOFOL 500 MG/50ML IV EMUL
INTRAVENOUS | Status: DC | PRN
  Administered 2024-11-17: 200 ug/kg/min via INTRAVENOUS

## 2024-11-17 NOTE — Anesthesia Postprocedure Evaluation (Signed)
"   Anesthesia Post Note  Patient: [name]  Procedure(s) Performed: COLONOSCOPY POLYPECTOMY, INTESTINE  Patient location during evaluation: PACU Anesthesia Type: General Level of consciousness: awake and alert Pain management: pain level controlled Vital Signs Assessment: post-procedure vital signs reviewed and stable Respiratory status: spontaneous breathing, nonlabored ventilation, respiratory function stable and patient connected to nasal cannula oxygen Cardiovascular status: blood pressure returned to baseline and stable Postop Assessment: no apparent nausea or vomiting Anesthetic complications: no   No notable events documented.   Last Vitals:  Vitals:   11/17/24 0644 11/17/24 0807  BP: 130/70 (!) 101/52  Pulse: 69 68  Resp: 14 12  Temp: 36.8 C 36.6 C  SpO2: 100% 100%    Last Pain:  Vitals:   11/17/24 0807  TempSrc: Oral  PainSc: 0-No pain                 Andrea Limes      "

## 2024-11-17 NOTE — Op Note (Signed)
 [facility] Patient Name: [name] Procedure Date: 11/17/2024 7:16 AM MRN: [id number] Date of Birth: 08/10/1981 Attending MD: Deatrice Dine , MD, [id number] CSN: [id number] Age: 44 Admit Type: Outpatient Procedure:                Colonoscopy Indications:              Screening for colorectal malignant neoplasm Providers:                Deatrice Dine, MD, Charlena Edison, RN, Jon LABOR.                            Gerome RN, RN Referring MD:              Medicines:                Monitored Anesthesia Care Complications:            No immediate complications. Estimated Blood Loss:     Estimated blood loss was minimal. Procedure:                Pre-Anesthesia Assessment:                           - Prior to the procedure, a History and Physical                            was performed, and patient medications and                            allergies were reviewed. The patient's tolerance of                            previous anesthesia was also reviewed. The risks                            and benefits of the procedure and the sedation                            options and risks were discussed with the patient.                            All questions were answered, and informed consent                            was obtained. Prior Anticoagulants: The patient has                            taken no anticoagulant or antiplatelet agents. ASA                            Grade Assessment: II - A patient with mild systemic                            disease. After reviewing the risks and benefits,  the patient was deemed in satisfactory condition to                            undergo the procedure.                           After obtaining informed consent, the colonoscope                            was passed under direct vision. Throughout the                            procedure, the patient's blood pressure, pulse, and                            oxygen  saturations were monitored continuously. The                            CF-HQ190L ([id number]) Colon was introduced through                            the endoscope channel and advanced to the the                            terminal ileum. The PCF-HQ190L ([id number]) Peds Colon                            was introduced through the and advanced to the. The                            colonoscopy was performed without difficulty. The                            patient tolerated the procedure well. The quality                            of the bowel preparation was evaluated using the                            BBPS [facility] Bowel Preparation Scale) with scores                            of: Right Colon = 3, Transverse Colon = 3 and Left                            Colon = 3 (entire mucosa seen well with no residual                            staining, small fragments of stool or opaque                            liquid). The total BBPS score equals 9. Anatomical  landmarks were photographed. Scope In: 7:42:59 AM Scope Out: 8:00:45 AM Scope Withdrawal Time: 0 hours 15 minutes 22 seconds  Total Procedure Duration: 0 hours 17 minutes 46 seconds  Findings:      The perianal and digital rectal examinations were normal.      A 6 mm polyp was found in the recto-sigmoid colon. The polyp was       sessile. The polyp was removed with a cold snare. Resection and       retrieval were complete.      Non-bleeding internal hemorrhoids were found during retroflexion. The       hemorrhoids were small.      The terminal ileum appeared normal. Impression:               - One 6 mm polyp at the recto-sigmoid colon,                            removed with a cold snare. Resected and retrieved.                           - Non-bleeding internal hemorrhoids.                           - The examined portion of the ileum was normal. Moderate Sedation:      Per Anesthesia Care Recommendation:            - Patient has a contact number available for                            emergencies. The signs and symptoms of potential                            delayed complications were discussed with the                            patient. Return to normal activities tomorrow.                            Written discharge instructions were provided to the                            patient.                           - Resume previous diet.                           - Continue present medications.                           - Await pathology results.                           - Repeat colonoscopy in 7-10 years for screening                            purposes. Procedure Code(s):        --- Professional ---  54614, Colonoscopy, flexible; with removal of                            tumor(s), polyp(s), or other lesion(s) by snare                            technique Diagnosis Code(s):        --- Professional ---                           Z12.11, Encounter for screening for malignant                            neoplasm of colon                           D12.7, Benign neoplasm of rectosigmoid junction                           K64.8, Other hemorrhoids CPT copyright 2022 American Medical Association. All rights reserved. The codes documented in this report are preliminary and upon coder review may  be revised to meet current compliance requirements. Deatrice Dine, MD Deatrice Dine, MD 11/17/2024 8:06:10 AM This report has been signed electronically. Number of Addenda: 0

## 2024-11-17 NOTE — Transfer of Care (Signed)
 Immediate Anesthesia Transfer of Care Note  Patient: [name]  Procedure(s) Performed: COLONOSCOPY POLYPECTOMY, INTESTINE  Patient Location: Short Stay  Anesthesia Type:General  Level of Consciousness: awake, alert , oriented, and patient cooperative  Airway & Oxygen Therapy: Patient Spontanous Breathing  Post-op Assessment: Report given to RN, Post -op Vital signs reviewed and stable, and Patient moving all extremities X 4  Post vital signs: Reviewed and stable  Last Vitals:  Vitals Value Taken Time  BP 101/52 11/17/24 08:07  Temp 36.6 C 11/17/24 08:07  Pulse 68 11/17/24 08:07  Resp 12 11/17/24 08:07  SpO2 100 % 11/17/24 08:07    Last Pain:  Vitals:   11/17/24 0807  TempSrc: Oral  PainSc: 0-No pain         Complications: No notable events documented.

## 2024-11-17 NOTE — Discharge Instructions (Signed)

## 2024-11-17 NOTE — H&P (Signed)
 Primary Care Physician:  Orpha Yancey LABOR, MD Primary Gastroenterologist:  Dr. Cinderella  Pre-Procedure History & Physical: HPI:  [name] is a 44 y.o. female is here for a colonoscopy for colon cancer screening purposes.  Patient denies any family history of colorectal cancer.  No melena or hematochezia.  No abdominal pain or unintentional weight loss.  No change in bowel habits.  Overall feels well from a GI standpoint.  Past Medical History:  Diagnosis Date   Acid reflux    Anxiety    Arthritis    Environmental allergies    Gastric ulcer    Gastritis    Hypertension    Migraine     Past Surgical History:  Procedure Laterality Date   arm surgery Left    plate in arm   BILATERAL SALPINGECTOMY     BIOPSY  09/03/2023   Procedure: BIOPSY;  Surgeon: Eartha Angelia Sieving, MD;  Location: [facility];  Service: Gastroenterology;;   BONE BIOPSY Right    thumb   BRAIN SURGERY     evacuation of hematoma   CESAREAN SECTION     x2   CHOLECYSTECTOMY     ECTOPIC PREGNANCY SURGERY     x2   ENDOMETRIAL ABLATION  01/18/2018   Procedure: ENDOMETRIAL ABLATION WITH MINERVA;  Surgeon: Edsel Norleen GAILS, MD;  Location: [facility];  Service: Gynecology;;   ESOPHAGOGASTRODUODENOSCOPY (EGD) WITH PROPOFOL  N/A 01/30/2014   Procedure: ESOPHAGOGASTRODUODENOSCOPY (EGD) WITH PROPOFOL ;  Surgeon: Margo LITTIE Haddock, MD;  Location: [facility];  Service: Endoscopy;  Laterality: N/A;   ESOPHAGOGASTRODUODENOSCOPY (EGD) WITH PROPOFOL  N/A 09/03/2023   Procedure: ESOPHAGOGASTRODUODENOSCOPY (EGD) WITH PROPOFOL ;  Surgeon: Eartha Angelia Sieving, MD;  Location: [facility];  Service: Gastroenterology;  Laterality: N/A;   HYSTEROSCOPY WITH D & C N/A 08/13/2015   Procedure: DILATATION AND CURETTAGE /HYSTEROSCOPY;  Surgeon: Norleen Edsel GAILS, MD;  Location: [facility];  Service: Gynecology;  Laterality: N/A;   HYSTEROSCOPY WITH D & C  01/18/2018   Procedure: CERVICAL DILATATION /HYSTEROSCOPY;  Surgeon: Edsel Norleen GAILS, MD;   Location: [facility];  Service: Gynecology;;   LEG SURGERY Left    femur rodding and removal of part of left femur   otif ankle Right     Prior to Admission medications  Medication Sig Start Date End Date Taking? Authorizing Provider  amitriptyline  (ELAVIL ) 50 MG tablet Take 1 tablet (50 mg total) by mouth at bedtime. 10/12/24 04/10/25 Yes Caliann Leckrone, Deatrice FALCON, MD  gabapentin  (NEURONTIN ) 600 MG tablet Take 600 mg by mouth 4 (four) times daily. 04/17/24  Yes [provider]  HYDROcodone -acetaminophen  (NORCO) 10-325 MG tablet Take 1 tablet by mouth every 8 (eight) hours as needed for moderate pain (pain score 4-6).   Yes [provider]  linaclotide  (LINZESS ) 290 MCG CAPS capsule Take 1 capsule (290 mcg total) by mouth daily before breakfast. 09/04/24 03/03/25 Yes Heily Carlucci, Deatrice FALCON, MD  Multiple Vitamins-Minerals (MULTIVITAMIN ADULT) CHEW Chew 2 each by mouth daily.   Yes [provider]  pantoprazole  (PROTONIX ) 40 MG tablet Take 1 tablet (40 mg total) by mouth 2 (two) times daily. 07/12/24  Yes Colter Magowan, Deatrice FALCON, MD  traZODone  (DESYREL ) 150 MG tablet Take 150 mg by mouth daily. 08/10/23  Yes [provider]  NURTEC 75 MG TBDP Take 1 tablet by mouth as needed (migraine). Patient not taking: Reported on 07/12/2024 08/26/23   [provider]  predniSONE  (STERAPRED UNI-PAK 48 TAB) 10 MG (48) TBPK tablet 10 MG 12 DAYS AS  DIRECTED 09/05/24   Margrette Taft BRAVO, MD  promethazine  (PHENERGAN ) 25 MG suppository Place 1 suppository (25 mg total) rectally every 6 (six) hours as needed for nausea or vomiting. Patient not taking: Reported on 07/12/2024 06/02/24   Suzette Pac, MD    Allergies as of 10/12/2024 - Review Complete 09/08/2024  Allergen Reaction Noted   Orange fruit [citrus] Other (See Comments) 01/26/2014   Demerol Nausea Only 01/14/2011    Family History  Problem Relation Age of Onset   Mental illness Mother    Diabetes Mother    Obesity Mother    Heart  disease Father    Obesity Sister    Lupus Paternal Aunt    Cancer Maternal Grandfather        GASTRIC   Lupus Paternal Grandmother    Diabetes Son        boarderline   Arthritis Other    Cancer Other    Diabetes Other     Social History   Socioeconomic History   Marital status: Married    Spouse name: Not on file   Number of children: Not on file   Years of education: Not on file   Highest education level: Not on file  Occupational History   Not on file  Tobacco Use   Smoking status: Former    Current packs/day: 0.00    Average packs/day: 1 pack/day for 20.0 years (20.0 ttl pk-yrs)    Types: Cigarettes    Start date: 12/02/1991    Quit date: 12/02/2011    Years since quitting: 12.9   Smokeless tobacco: Never  Vaping Use   Vaping status: Every Day  Substance and Sexual Activity   Alcohol use: No   Drug use: No   Sexual activity: Yes    Birth control/protection: Surgical  Other Topics Concern   Not on file  Social History Narrative   Not on file   Social Drivers of Health   Tobacco Use: Medium Risk (11/17/2024)   Patient History    Smoking Tobacco Use: Former    Smokeless Tobacco Use: Never    Passive Exposure: Not on Actuary Strain: Low Risk (08/04/2023)   Overall Financial Resource Strain (CARDIA)    Difficulty of Paying Living Expenses: Not very hard  Food Insecurity: No Food Insecurity (06/03/2024)   Epic    Worried About Radiation Protection Practitioner of Food in the Last Year: Never true    Ran Out of Food in the Last Year: Never true  Transportation Needs: No Transportation Needs (06/03/2024)   Epic    Lack of Transportation (Medical): No    Lack of Transportation (Non-Medical): No  Physical Activity: Insufficiently Active (08/04/2023)   Exercise Vital Sign    Days of Exercise per Week: 3 days    Minutes of Exercise per Session: 30 min  Stress: No Stress Concern Present (08/04/2023)   [employer] of Occupational Health - Occupational Stress Questionnaire     Feeling of Stress : Only a little  Social Connections: Moderately Integrated (08/04/2023)   Social Connection and Isolation Panel    Frequency of Communication with Friends and Family: Three times a week    Frequency of Social Gatherings with Friends and Family: Twice a week    Attends Religious Services: 1 to 4 times per year    Active Member of [employer] or Organizations: No    Attends Banker Meetings: Never    Marital Status: Married  Catering Manager Violence: Not At Risk (  06/03/2024)   Epic    Fear of Current or Ex-Partner: No    Emotionally Abused: No    Physically Abused: No    Sexually Abused: No  Depression (PHQ2-9): Low Risk (08/17/2024)   Depression (PHQ2-9)    PHQ-2 Score: 0  Alcohol Screen: Low Risk (08/04/2023)   Alcohol Screen    Last Alcohol Screening Score (AUDIT): 0  Housing: Low Risk (06/03/2024)   Epic    Unable to Pay for Housing in the Last Year: No    Number of Times Moved in the Last Year: 0    Homeless in the Last Year: No  Utilities: Not At Risk (06/03/2024)   Epic    Threatened with loss of utilities: No  Health Literacy: Not on file    Review of Systems: See HPI, otherwise negative ROS  Physical Exam: Vital signs in last 24 hours: Temp:  [98.2 F (36.8 C)] 98.2 F (36.8 C) (01/23 0644) Pulse Rate:  [69] 69 (01/23 0644) Resp:  [14] 14 (01/23 0644) BP: (130)/(70) 130/70 (01/23 0644) SpO2:  [100 %] 100 % (01/23 0644) Weight:  [81.6 kg] 81.6 kg (01/23 0644)   General:   Alert,  Well-developed, well-nourished, pleasant and cooperative in NAD Head:  Normocephalic and atraumatic. Eyes:  Sclera clear, no icterus.   Conjunctiva pink. Ears:  Normal auditory acuity. Nose:  No deformity, discharge,  or lesions. Msk:  Symmetrical without gross deformities. Normal posture. Extremities:  Without clubbing or edema. Neurologic:  Alert and  oriented x4;  grossly normal neurologically. Skin:  Intact without significant lesions or rashes. Psych:   Alert and cooperative. Normal mood and affect.  Impression/Plan: [name] is here for a colonoscopy to be performed for colon cancer screening purposes.  The risks of the procedure including infection, bleed, or perforation as well as benefits, limitations, alternatives and imponderables have been reviewed with the patient. Questions have been answered. All parties agreeable.

## 2024-11-17 NOTE — Anesthesia Preprocedure Evaluation (Addendum)
"                                    Anesthesia Evaluation  Patient identified by MRN, date of birth, ID band Patient awake    Reviewed: Allergy & Precautions, H&P , NPO status , Patient's Chart, lab work & pertinent test results  Airway Mallampati: II  TM Distance: >3 FB Neck ROM: Full    Dental  (+) Poor Dentition   Pulmonary former smoker   Pulmonary exam normal breath sounds clear to auscultation       Cardiovascular hypertension, Normal cardiovascular exam Rhythm:Regular Rate:Normal     Neuro/Psych  Headaches  Anxiety      negative psych ROS   GI/Hepatic PUD,GERD  ,,(+)     substance abuse  marijuana use  Endo/Other  negative endocrine ROS    Renal/GU negative Renal ROS  negative genitourinary   Musculoskeletal  (+) Arthritis ,    Abdominal   Peds negative pediatric ROS (+)  Hematology negative hematology ROS (+)   Anesthesia Other Findings   Reproductive/Obstetrics negative OB ROS                              Anesthesia Physical Anesthesia Plan  ASA: 2  Anesthesia Plan: General   Post-op Pain Management:    Induction: Intravenous  PONV Risk Score and Plan:   Airway Management Planned: Nasal Cannula and Natural Airway  Additional Equipment:   Intra-op Plan:   Post-operative Plan:   Informed Consent: I have reviewed the patients History and Physical, chart, labs and discussed the procedure including the risks, benefits and alternatives for the proposed anesthesia with the patient or authorized representative who has indicated his/her understanding and acceptance.     Dental advisory given  Plan Discussed with: CRNA  Anesthesia Plan Comments:          Anesthesia Quick Evaluation  "

## 2024-11-20 LAB — SURGICAL PATHOLOGY

## 2024-11-21 ENCOUNTER — Ambulatory Visit (INDEPENDENT_AMBULATORY_CARE_PROVIDER_SITE_OTHER): Payer: Self-pay | Admitting: Gastroenterology

## 2024-11-21 ENCOUNTER — Encounter (HOSPITAL_COMMUNITY): Payer: Self-pay | Admitting: Gastroenterology

## 2024-11-21 NOTE — Progress Notes (Signed)
 10 yr TCS noted in recall Patient result letter mailed procedure note and pathology result faxed to PCP
